# Patient Record
Sex: Male | Born: 1950 | Race: White | Hispanic: No | Smoking: Former smoker
Health system: Southern US, Community
[De-identification: ages and names within clinical notes are randomized; demographics above are authoritative.]

## PROBLEM LIST (undated history)

## (undated) DIAGNOSIS — Z860100 Personal history of colon polyps, unspecified: Secondary | ICD-10-CM

## (undated) DIAGNOSIS — H269 Unspecified cataract: Secondary | ICD-10-CM

## (undated) DIAGNOSIS — C9110 Chronic lymphocytic leukemia of B-cell type not having achieved remission: Secondary | ICD-10-CM

## (undated) DIAGNOSIS — Z8709 Personal history of other diseases of the respiratory system: Secondary | ICD-10-CM

## (undated) DIAGNOSIS — IMO0001 Reserved for inherently not codable concepts without codable children: Secondary | ICD-10-CM

## (undated) DIAGNOSIS — N529 Male erectile dysfunction, unspecified: Secondary | ICD-10-CM

## (undated) DIAGNOSIS — E785 Hyperlipidemia, unspecified: Secondary | ICD-10-CM

## (undated) DIAGNOSIS — D649 Anemia, unspecified: Secondary | ICD-10-CM

## (undated) DIAGNOSIS — M1990 Unspecified osteoarthritis, unspecified site: Secondary | ICD-10-CM

## (undated) HISTORY — DX: Hyperlipidemia, unspecified: E78.5

## (undated) HISTORY — DX: Personal history of colon polyps, unspecified: Z86.0100

## (undated) HISTORY — DX: Unspecified osteoarthritis, unspecified site: M19.90

## (undated) HISTORY — DX: Male erectile dysfunction, unspecified: N52.9

## (undated) HISTORY — DX: Unspecified cataract: H26.9

## (undated) HISTORY — DX: Personal history of colonic polyps: Z86.010

## (undated) HISTORY — PX: KNEE SURGERY: SHX244

## (undated) HISTORY — PX: APPENDECTOMY: SHX54

---

## 2011-04-25 ENCOUNTER — Encounter: Payer: Self-pay | Admitting: Internal Medicine

## 2011-06-06 ENCOUNTER — Encounter: Payer: Self-pay | Admitting: Internal Medicine

## 2011-06-06 ENCOUNTER — Ambulatory Visit (INDEPENDENT_AMBULATORY_CARE_PROVIDER_SITE_OTHER): Payer: Managed Care, Other (non HMO) | Admitting: Internal Medicine

## 2011-06-06 DIAGNOSIS — K5900 Constipation, unspecified: Secondary | ICD-10-CM

## 2011-06-06 DIAGNOSIS — Z800 Family history of malignant neoplasm of digestive organs: Secondary | ICD-10-CM

## 2011-06-06 DIAGNOSIS — Z86010 Personal history of colonic polyps: Secondary | ICD-10-CM

## 2011-06-06 MED ORDER — PEG-KCL-NACL-NASULF-NA ASC-C 100 G PO SOLR: 1.0000 | Freq: Once | ORAL | Status: DC

## 2011-06-06 NOTE — Progress Notes (Signed)
HISTORY OF PRESENT ILLNESS:  Travis Wood is a 60 y.o. male who said today regarding constipation and the need for colonoscopy. The patient's father had colon cancer at age 57 as well as colon polyps. Patient has had several colonoscopies, most recently August 2010 in [place] while being [facility]. No report available today, though the patient reports suboptimal prep. Prior colonoscopies in [place] and [place] more remotely. Apparently, a history of polyps. Problems with constipation intermittently. GI review of systems otherwise negative. Review of outside laboratories from May of 2012, including CBC, and comprehensive metabolic panel were unremarkable.  REVIEW OF SYSTEMS:  All non-GI ROS entirely negative.  Past Medical History  Diagnosis Date  . History of colon polyps   . Osteoarthritis   . Hyperlipemia     Past Surgical History  Procedure Date  . Appendectomy   . Knee surgery     Left x 2     Social History Travis Wood  reports that he has quit smoking. He has never used smokeless tobacco. He reports that he does not drink alcohol or use illicit drugs.  family history includes Colon cancer in his father and Colon polyps in his father.  No Known Allergies     PHYSICAL EXAMINATION: Vital signs: BP 124/62  Pulse 80  Ht 6\' 2"  (1.88 m)  Wt 232 lb (105.235 kg)  BMI 29.79 kg/m2  Constitutional: generally well-appearing, no acute distress Psychiatric: alert and oriented x3, cooperative Eyes: extraocular movements intact, anicteric, conjunctiva pink Mouth: oral pharynx moist, no lesions Neck: supple no lymphadenopathy Cardiovascular: heart regular rate and rhythm, no murmur Lungs: clear to auscultation bilaterally Abdomen: soft, nontender, nondistended, no obvious ascites, no peritoneal signs, normal bowel sounds, no organomegaly Rectal: Deferred until colonoscopy Extremities: no lower extremity edema bilaterally Skin: no lesions on visible  extremities Neuro: No focal deficits.  ASSESSMENT:  #1. Functional constipation #2. Family history of colon cancer and personal history of polyps (type unknown) #3. Colonoscopy 2 years ago reported to have suboptimal prep   PLAN:  #1. Patient has agreed to bring prior GI records for my review #2. MiraLax when necessary for constipation #3. Colonoscopy. Movi prep prescribed. Patient instructed on its use. The nature of the procedure, as well as the risks, benefits, and alternatives were carefully and thoroughly reviewed with the patient. Ample time for discussion and questions allowed. The patient understood, was satisfied, and agreed to proceed.

## 2011-06-06 NOTE — Patient Instructions (Signed)
Colon [facility] 07/24/11 10:30 am arrive at 9:30 am on 4th floor Moviprep prescription sent to your pharmacy  Colonoscopy brochure given to you for review.

## 2011-07-24 ENCOUNTER — Ambulatory Visit (AMBULATORY_SURGERY_CENTER): Payer: Managed Care, Other (non HMO) | Admitting: Internal Medicine

## 2011-07-24 ENCOUNTER — Encounter: Payer: Self-pay | Admitting: Internal Medicine

## 2011-07-24 VITALS — BP 112/69 | HR 54 | Temp 97.5°F | Resp 20 | Ht 75.0 in | Wt 232.0 lb

## 2011-07-24 DIAGNOSIS — Z860100 Personal history of colon polyps, unspecified: Secondary | ICD-10-CM

## 2011-07-24 DIAGNOSIS — Z1211 Encounter for screening for malignant neoplasm of colon: Secondary | ICD-10-CM

## 2011-07-24 DIAGNOSIS — D126 Benign neoplasm of colon, unspecified: Secondary | ICD-10-CM

## 2011-07-24 DIAGNOSIS — K5900 Constipation, unspecified: Secondary | ICD-10-CM

## 2011-07-24 DIAGNOSIS — Z800 Family history of malignant neoplasm of digestive organs: Secondary | ICD-10-CM

## 2011-07-24 LAB — HM COLONOSCOPY

## 2011-07-24 MED ORDER — SODIUM CHLORIDE 0.9 % IV SOLN: 500.0000 mL | INTRAVENOUS | Status: DC

## 2011-07-24 NOTE — Patient Instructions (Signed)
Please read the handouts given to you by your recovery room nurse.   You need another colonoscopy in 3 years due to 7 polyps.   The biopsy results will be mailed to you within 2 weeks.    You need to increase the fiber in your diet due to your severe diverticulosis in your colon.  We recommend metamucil or benefiber for  Supplements.   Resume your routine medications today.   Drink plenty of water.   If you have any questions, please call us at [phone].  Thank-you.

## 2011-07-25 ENCOUNTER — Telehealth: Payer: Self-pay | Admitting: *Deleted

## 2011-07-25 NOTE — Telephone Encounter (Signed)

## 2013-01-07 ENCOUNTER — Encounter: Payer: Self-pay | Admitting: Family Medicine

## 2014-06-14 ENCOUNTER — Encounter: Payer: Self-pay | Admitting: Internal Medicine

## 2015-01-08 ENCOUNTER — Ambulatory Visit (INDEPENDENT_AMBULATORY_CARE_PROVIDER_SITE_OTHER): Payer: 59

## 2015-01-08 ENCOUNTER — Encounter: Payer: Self-pay | Admitting: Family Medicine

## 2015-01-08 ENCOUNTER — Ambulatory Visit (INDEPENDENT_AMBULATORY_CARE_PROVIDER_SITE_OTHER): Payer: 59 | Admitting: Family Medicine

## 2015-01-08 VITALS — BP 131/76 | HR 68 | Temp 98.0°F | Ht 74.5 in | Wt 225.6 lb

## 2015-01-08 DIAGNOSIS — J439 Emphysema, unspecified: Secondary | ICD-10-CM

## 2015-01-08 DIAGNOSIS — N4289 Other specified disorders of prostate: Secondary | ICD-10-CM

## 2015-01-08 DIAGNOSIS — N429 Disorder of prostate, unspecified: Secondary | ICD-10-CM

## 2015-01-08 DIAGNOSIS — M25511 Pain in right shoulder: Secondary | ICD-10-CM

## 2015-01-08 DIAGNOSIS — Z0000 Encounter for general adult medical examination without abnormal findings: Secondary | ICD-10-CM

## 2015-01-08 DIAGNOSIS — Z1212 Encounter for screening for malignant neoplasm of rectum: Secondary | ICD-10-CM

## 2015-01-08 MED ORDER — LEVOFLOXACIN 500 MG PO TABS: 500.0000 mg | ORAL_TABLET | Freq: Every day | ORAL | Status: DC

## 2015-01-08 MED ORDER — ALBUTEROL SULFATE (2.5 MG/3ML) 0.083% IN NEBU
2.5000 mg | INHALATION_SOLUTION | Freq: Once | RESPIRATORY_TRACT | Status: AC
  Administered 2015-01-08: 2.5 mg via RESPIRATORY_TRACT

## 2015-01-08 MED ORDER — DICLOFENAC SODIUM 75 MG PO TBEC: 75.0000 mg | DELAYED_RELEASE_TABLET | Freq: Two times a day (BID) | ORAL | Status: DC

## 2015-01-08 MED ORDER — PNEUMOCOCCAL VAC POLYVALENT 25 MCG/0.5ML IJ INJ: 0.5000 mL | INJECTION | Freq: Once | INTRAMUSCULAR | Status: DC

## 2015-01-08 MED ORDER — BETAMETHASONE SOD PHOS & ACET 6 (3-3) MG/ML IJ SUSP
6.0000 mg | Freq: Once | INTRAMUSCULAR | Status: AC
  Administered 2015-01-08: 6 mg via INTRAMUSCULAR

## 2015-01-08 NOTE — Progress Notes (Signed)
Subjective:  Patient ID: Travis Wood, male    DOB: [birth year]  Age: 64 y.o. MRN: [id number]  CC: Annual Exam   HPI Travis Wood presents for CPE, but notes some dyspnea on exertion, wheezing. Smokes cannabis for many years. Jokes about tobacco, unclear how much tobacco is used. Also having a great deal of pain in the right shoulder particularly for external adducted rotation. Has had frequent colonoscopy due to polyp in past that was precancerous when removed at colonoscopy.  History Travis Wood has a past medical history of History of colon polyps; Osteoarthritis; Hyperlipemia; and Erectile dysfunction.   He has past surgical history that includes Appendectomy and Knee surgery.   His family history includes Cancer in his sister; Colon cancer in his father; Colon polyps in his father; Heart disease in his father and mother.He reports that he has never smoked. He has never used smokeless tobacco. He reports that he does not drink alcohol or use illicit drugs.  Current Outpatient Prescriptions on File Prior to Visit  Medication Sig Dispense Refill  . sildenafil (VIAGRA) 100 MG tablet Take 100 mg by mouth as needed. Take 1 hour before sex     No current facility-administered medications on file prior to visit.    ROS Review of Systems  Constitutional: Negative for fever, chills, diaphoresis and unexpected weight change.  HENT: Negative for congestion, hearing loss, rhinorrhea, sore throat and trouble swallowing.   Respiratory: Negative for cough, chest tightness, shortness of breath and wheezing.   Gastrointestinal: Negative for nausea, vomiting, abdominal pain, diarrhea, constipation and abdominal distention.  Endocrine: Negative for cold intolerance and heat intolerance.  Genitourinary: Negative for dysuria, hematuria and flank pain.  Musculoskeletal: Negative for joint swelling and arthralgias.  Skin: Negative for rash.  Neurological: Negative for dizziness and headaches.    Psychiatric/Behavioral: Negative for dysphoric mood, decreased concentration and agitation. The patient is not nervous/anxious.     Objective:  BP 131/76 mmHg  Pulse 68  Temp(Src) 98 F (36.7 C) (Oral)  Ht 6' 2.5" (1.892 m)  Wt 225 lb 9.6 oz (102.331 kg)  BMI 28.59 kg/m2  Physical Exam  Constitutional: He is oriented to person, place, and time. He appears well-developed and well-nourished.  HENT:  Head: Normocephalic and atraumatic.  Mouth/Throat: Oropharynx is clear and moist.  Eyes: EOM are normal. Pupils are equal, round, and reactive to light.  Neck: Normal range of motion. No tracheal deviation present. No thyromegaly present.  Cardiovascular: Normal rate, regular rhythm and normal heart sounds.  Exam reveals no gallop and no friction rub.   No murmur heard. Pulmonary/Chest: Breath sounds normal. He has no wheezes. He has no rales.  Abdominal: Soft. He exhibits no mass. There is no tenderness.  Genitourinary: Penis normal. Rectal exam shows no external hemorrhoid, no tenderness and anal tone normal. Prostate is enlarged.  Musculoskeletal: Normal range of motion. He exhibits no edema.  Neurological: He is alert and oriented to person, place, and time.  Skin: Skin is warm and dry.  Psychiatric: He has a normal mood and affect.    Assessment & Plan:   Travis Wood was seen today for annual exam.  Diagnoses and associated orders for this visit:  Wellness examination - POCT CBC - CMP14+EGFR - NMR, lipoprofile - PSA, total and free - Thyroid Panel With TSH - Vit D  25 hydroxy (rtn osteoporosis monitoring)  Pulmonary emphysema, unspecified emphysema type - albuterol (PROVENTIL) (2.5 MG/3ML) 0.083% nebulizer solution 2.5 mg; Take 3 mLs (2.5 mg total) by  nebulization once. - pneumococcal 23 valent vaccine (PNU-IMMUNE) injection 0.5 mL; Inject 0.5 mLs into the muscle once. - betamethasone acetate-betamethasone sodium phosphate (CELESTONE) injection 6 mg; Inject 1 mL (6 mg total)  into the muscle once. - DG Chest 2 View; Future  Pain in joint, shoulder region, right - Ambulatory referral to Physical Therapy  Prostate mass  Screening for malignant neoplasm of the rectum - Fecal occult blood, imunochemical  Other Orders - levofloxacin (LEVAQUIN) 500 MG tablet; Take 1 tablet (500 mg total) by mouth daily. - Pneumococcal polysaccharide vaccine 23-valent greater than or equal to 2yo subcutaneous/IM - diclofenac (VOLTAREN) 75 MG EC tablet; Take 1 tablet (75 mg total) by mouth 2 (two) times daily.    I have discontinued Travis Wood diclofenac and simvastatin. I am also having him start on levofloxacin and diclofenac. Additionally, I am having him maintain his sildenafil. We administered albuterol and betamethasone acetate-betamethasone sodium phosphate. We will continue to administer pneumococcal 23 valent vaccine.  Meds ordered this encounter  Medications  . albuterol (PROVENTIL) (2.5 MG/3ML) 0.083% nebulizer solution 2.5 mg    Sig:   . pneumococcal 23 valent vaccine (PNU-IMMUNE) injection 0.5 mL    Sig:   . betamethasone acetate-betamethasone sodium phosphate (CELESTONE) injection 6 mg    Sig:   . levofloxacin (LEVAQUIN) 500 MG tablet    Sig: Take 1 tablet (500 mg total) by mouth daily.    Dispense:  7 tablet    Refill:  0  . diclofenac (VOLTAREN) 75 MG EC tablet    Sig: Take 1 tablet (75 mg total) by mouth 2 (two) times daily.    Dispense:  60 tablet    Refill:  2     Follow-up: Return in about 6 months (around 07/09/2015) for COPD.  Claretta Fraise, M.D.

## 2015-01-11 LAB — FECAL OCCULT BLOOD, IMMUNOCHEMICAL: Fecal Occult Bld: NEGATIVE

## 2015-01-23 ENCOUNTER — Ambulatory Visit: Payer: 59 | Attending: Family Medicine | Admitting: Physical Therapy

## 2015-01-23 ENCOUNTER — Encounter: Payer: Self-pay | Admitting: Physical Therapy

## 2015-01-23 DIAGNOSIS — M1990 Unspecified osteoarthritis, unspecified site: Secondary | ICD-10-CM | POA: Insufficient documentation

## 2015-01-23 DIAGNOSIS — M25511 Pain in right shoulder: Secondary | ICD-10-CM | POA: Diagnosis present

## 2015-01-23 DIAGNOSIS — E785 Hyperlipidemia, unspecified: Secondary | ICD-10-CM | POA: Insufficient documentation

## 2015-01-23 NOTE — Addendum Note (Signed)
Addended by: Leif Loflin, [place] W on: 01/23/2015 03:55 PM   Modules accepted: Orders

## 2015-01-23 NOTE — Addendum Note (Signed)
Addended by: Haidan Nhan, [place] W on: 01/23/2015 11:38 AM   Modules accepted: Orders

## 2015-01-23 NOTE — Therapy (Signed)
[facility] [place], [place], [address] Phone: [phone]   Fax:  [phone]  Physical Therapy Evaluation  Patient Details  Name: Travis Wood MRN: [id number] Date of Birth: February 03, 1951 Referring Provider:  Claretta Fraise, MD  Encounter Date: 01/23/2015      PT End of Session - 01/23/15 1035    Visit Number 1   Number of Visits 12   PT Start Time 2774   PT Stop Time 1114   PT Time Calculation (min) 39 min   Activity Tolerance Patient tolerated treatment well   Behavior During Therapy [facility] for tasks assessed/performed      Past Medical History  Diagnosis Date  . History of colon polyps   . Osteoarthritis   . Hyperlipemia   . Erectile dysfunction     Past Surgical History  Procedure Laterality Date  . Appendectomy    . Knee surgery      Left x 2     There were no vitals taken for this visit.  Visit Diagnosis:  Right shoulder pain - Plan: PT plan of care cert/re-cert      Subjective Assessment - 01/23/15 1038    Symptoms Throwing arm out/golfing hurts a lot.   Pertinent History Slipped and fell onto right shoulder.   Patient Stated Goals Want to golf without pain.   Currently in Pain? Yes   Pain Score 7    Pain Location Shoulder   Pain Orientation Right   Pain Descriptors / Indicators Stabbing;Sharp   Pain Type Chronic pain   Pain Onset More than a month ago   Pain Frequency Intermittent   Aggravating Factors  Golfing.   Pain Relieving Factors Rest.   Effect of Pain on Daily Activities Can golf like I want to.   Multiple Pain Sites No          [facility] PT Assessment - 01/23/15 0001    Assessment   Medical Diagnosis Right shoulder pain.   Onset Date --  Mid-summer 2015   Precautions   Precautions None   Balance Screen   Has the patient fallen in the past 6 months Yes  Slipped and fell   How many times? 1   Has the patient had a decrease in activity level because of a fear of falling?  No   Is the patient reluctant to leave their home because of a fear of falling?  No   ROM / Strength   AROM / PROM / Strength AROM;Strength   AROM   Overall AROM  Within functional limits for tasks performed   AROM Assessment Site Shoulder   Right/Left Shoulder Right   Right Shoulder External Rotation --  4 to 4+/5 limited in part due to pain.   Strength   Overall Strength Comments Right shoulder ER= 4 to 4+/5 limited in part due to pain.   Strength Assessment Site Shoulder   Right/Left Shoulder Right   Palpation   Palpation Tender to palpation right posterior cuff region and acromial ridge   Special Tests    Special Tests Rotator Cuff Impingement   Rotator Cuff Impingment tests Neer impingement test   Neer Impingement test    Findings Positive   Side Right                  [facility] Adult PT Treatment/Exercise - 01/23/15 0001    Modalities   Modalities Electrical Stimulation   Electrical Stimulation   Electrical Stimulation Location Right shoulder   Electrical  Stimulation Parameters Pre-mod at 80-150 Hz x 20 minutes   Electrical Stimulation Goals Pain                     PT Long Term Goals - 01/23/15 1105    PT LONG TERM GOAL #1   Title Ind with an advanced HEP.   Time 6   Period Weeks   Status New   PT LONG TERM GOAL #2   Title Return to golfing with pain not > 2/10   Time 6   Period Weeks   Status New   PT LONG TERM GOAL #3   Title Peform all ADL's with pain not > 2/10   Time 6   Period Weeks   Status New               Plan - 01/23/15 1102    Clinical Impression Statement The patient reports right shoulder pain since last summer but especially after slipping an dfalling onto his right shoulder.  He reports no pain at rest but up to 5/44 with certain right UE movements.  It has impaired his ability to play golf.   Rehab Potential Excellent   PT Frequency 2x / week   PT Duration 6 weeks   PT Treatment/Interventions Moist Heat;Therapeutic  activities;Therapeutic exercise;Electrical Stimulation;Ultrasound;Manual techniques   PT Next Visit Plan Begin right shouler [place] 4; SDLY ER and full can   PT Home Exercise Plan RW4   Consulted and Agree with Plan of Care Patient         Problem List There are no active problems to display for this patient.   Omega Durante, [place] MPT 01/23/2015, 11:29 AM  [facility] [address] [place], [place], [address] Phone: [phone]   Fax:  [phone]

## 2015-01-30 ENCOUNTER — Ambulatory Visit: Payer: 59 | Attending: Family Medicine | Admitting: *Deleted

## 2015-01-30 ENCOUNTER — Encounter: Payer: Self-pay | Admitting: *Deleted

## 2015-01-30 DIAGNOSIS — M1990 Unspecified osteoarthritis, unspecified site: Secondary | ICD-10-CM | POA: Insufficient documentation

## 2015-01-30 DIAGNOSIS — M25511 Pain in right shoulder: Secondary | ICD-10-CM | POA: Diagnosis not present

## 2015-01-30 DIAGNOSIS — E785 Hyperlipidemia, unspecified: Secondary | ICD-10-CM | POA: Diagnosis not present

## 2015-01-30 NOTE — Therapy (Signed)
[facility] [place], [place], [address] Phone: [phone]   Fax:  [phone]  Physical Therapy Treatment  Patient Details  Name: Humberto Addo MRN: [id number] Date of Birth: 19-Nov-1951 Referring Provider:  Claretta Fraise, MD  Encounter Date: 01/30/2015      PT End of Session - 01/30/15 1054    Visit Number 2   Number of Visits 12   PT Start Time 1031   PT Stop Time 1120   PT Time Calculation (min) 49 min      Past Medical History  Diagnosis Date  . History of colon polyps   . Osteoarthritis   . Hyperlipemia   . Erectile dysfunction     Past Surgical History  Procedure Laterality Date  . Appendectomy    . Knee surgery      Left x 2     There were no vitals taken for this visit.  Visit Diagnosis:  Right shoulder pain      Subjective Assessment - 01/30/15 1045    Symptoms RT shldr mainly hurts with a raising motion   Aggravating Factors  Pulling things up   Pain Relieving Factors rest,                    [facility] Adult PT Treatment/Exercise - 01/30/15 0001    Exercises   Exercises Shoulder   Shoulder Exercises: Supine   Protraction Strengthening   Shoulder Exercises: Standing   Other Standing Exercises RW 4 with yellow t-band 3x10 FLex/ext, ER/IR   Other Standing Exercises Full can to shldr level 1# x10, 2# x10 pause at top   Modalities   Modalities Ultrasound   Ultrasound   Ultrasound Location RT shldr lateral aspect in sitting   Ultrasound Parameters 1.5 w/cm sq. x10 mins   Ultrasound Goals Pain                PT Education - 01/30/15 1052    Education provided Yes   Education Details RW 4 with yellow tband   Person(s) Educated Patient   Methods Explanation;Demonstration;Handout   Comprehension Verbalized understanding;Returned demonstration             PT Long Term Goals - 01/23/15 1105    PT LONG TERM GOAL #1   Title Ind with an advanced HEP.   Time 6   Period  Weeks   Status New   PT LONG TERM GOAL #2   Title Return to golfing with pain not > 2/10   Time 6   Period Weeks   Status New   PT LONG TERM GOAL #3   Title Peform all ADL's with pain not > 2/10   Time 6   Period Weeks   Status New               Plan - 01/30/15 1056    Clinical Impression Statement Pt did fairly well with Rx today and is independent with current HEP   PT Next Visit Plan cont with RT shldr rehab. try side lying  er rotation        Problem List There are no active problems to display for this patient.   Lynne Takemoto,CHRIS PTA 01/30/2015, 12:10 PM  [facility] [address] [place], [place], [address] Phone: [phone]   Fax:  [phone]

## 2015-02-02 ENCOUNTER — Encounter: Payer: Self-pay | Admitting: *Deleted

## 2015-02-02 ENCOUNTER — Ambulatory Visit: Payer: 59 | Admitting: *Deleted

## 2015-02-02 DIAGNOSIS — M25511 Pain in right shoulder: Secondary | ICD-10-CM

## 2015-02-02 NOTE — Therapy (Signed)
[facility] [place], [place], [address] Phone: [phone]   Fax:  [phone]  Physical Therapy Treatment  Patient Details  Name: Travis Wood MRN: [id number] Date of Birth: 1951/06/01 Referring Provider:  Claretta Fraise, MD  Encounter Date: 02/02/2015      PT End of Session - 02/02/15 1046    Visit Number 3   Number of Visits 12   PT Start Time 7124   PT Stop Time 1117   PT Time Calculation (min) 47 min      Past Medical History  Diagnosis Date  . History of colon polyps   . Osteoarthritis   . Hyperlipemia   . Erectile dysfunction     Past Surgical History  Procedure Laterality Date  . Appendectomy    . Knee surgery      Left x 2     There were no vitals taken for this visit.  Visit Diagnosis:  Right shoulder pain      Subjective Assessment - 02/02/15 1035    Symptoms RT shldr mainly hurts with a raising motion. Did well after last Rx   Pertinent History Slipped and fell onto right shoulder.   Patient Stated Goals Want to golf without pain.   Currently in Pain? Yes   Pain Score 2    Pain Location Shoulder   Pain Orientation Right   Pain Descriptors / Indicators Sharp   Pain Type Chronic pain   Pain Onset More than a month ago   Pain Frequency Intermittent   Aggravating Factors  when pulling things up, golf                    [facility] Adult PT Treatment/Exercise - 02/02/15 0001    Shoulder Exercises: Sidelying   External Rotation Strengthening;Right;15 reps;Weights   External Rotation Weight (lbs) 1# 2x15   Shoulder Exercises: Standing   Other Standing Exercises RW 4 with yellow t-band 2x10 FLex/ext, ER/IR   Other Standing Exercises Full can to shldr level , 2# 2x15  pause at top   Shoulder Exercises: ROM/Strengthening   UBE (Upper Arm Bike) 90 RPMs x 6 mins   Modalities   Modalities Ultrasound  combo   Ultrasound   Ultrasound Location RT shldr   Ultrasound Parameters 1.5 w/cm  sq.   Ultrasound Goals Pain                     PT Long Term Goals - 01/23/15 1105    PT LONG TERM GOAL #1   Title Ind with an advanced HEP.   Time 6   Period Weeks   Status New   PT LONG TERM GOAL #2   Title Return to golfing with pain not > 2/10   Time 6   Period Weeks   Status New   PT LONG TERM GOAL #3   Title Peform all ADL's with pain not > 2/10   Time 6   Period Weeks   Status New               Plan - 02/02/15 1046    Clinical Impression Statement Pt did well again with exs and is Independent with RW4   Rehab Potential Excellent   PT Frequency 2x / week   PT Duration 6 weeks   PT Treatment/Interventions Moist Heat;Therapeutic activities;Therapeutic exercise;Electrical Stimulation;Ultrasound;Manual techniques   PT Next Visit Plan cont with RT shldr rehab. No new goals met today   PT Home Exercise  Plan RW4,side lying er        Problem List There are no active problems to display for this patient.   Mckinsey Keagle,CHRIS,PTA 02/02/2015, 11:34 AM  [facility] [address] [place], [place], [address] Phone: [phone]   Fax:  [phone]

## 2015-02-08 ENCOUNTER — Ambulatory Visit: Payer: 59 | Admitting: Physical Therapy

## 2015-02-08 ENCOUNTER — Encounter: Payer: Self-pay | Admitting: Physical Therapy

## 2015-02-08 DIAGNOSIS — M25511 Pain in right shoulder: Secondary | ICD-10-CM | POA: Diagnosis not present

## 2015-02-08 NOTE — Therapy (Signed)
[facility] [place], [place], [address] Phone: [phone]   Fax:  [phone]  Physical Therapy Treatment  Patient Details  Name: Travis Wood MRN: [id number] Date of Birth: 29-Sep-1951 Referring Provider:  Claretta Fraise, MD  Encounter Date: 02/08/2015      PT End of Session - 02/08/15 0850    Visit Number 4   Number of Visits 12   PT Start Time 0815   PT Stop Time 0850   PT Time Calculation (min) 35 min   Activity Tolerance Patient tolerated treatment well   Behavior During Therapy [facility] for tasks assessed/performed      Past Medical History  Diagnosis Date  . History of colon polyps   . Osteoarthritis   . Hyperlipemia   . Erectile dysfunction     Past Surgical History  Procedure Laterality Date  . Appendectomy    . Knee surgery      Left x 2     There were no vitals taken for this visit.  Visit Diagnosis:  Right shoulder pain      Subjective Assessment - 02/08/15 0819    Symptoms sore after playing golf    Pertinent History Slipped and fell onto right shoulder.   Patient Stated Goals Want to golf without pain.   Currently in Pain? Yes   Pain Score 6    Pain Location Shoulder   Pain Orientation Right   Pain Descriptors / Indicators Sore;Sharp   Pain Type Chronic pain   Pain Onset More than a month ago   Aggravating Factors  golf/activity   Pain Relieving Factors rest                    [facility] Adult PT Treatment/Exercise - 02/08/15 0001    Shoulder Exercises: Sidelying   External Rotation Weight (lbs) 2# 2x15   Shoulder Exercises: Standing   Other Standing Exercises RW 4 with yellow t-band 3x10 each   Other Standing Exercises Full can to shldr level , 2# 2x15    Shoulder Exercises: ROM/Strengthening   UBE (Upper Arm Bike) 90 RPMs x 8 mins   Modalities   Modalities Ultrasound   Ultrasound   Ultrasound Location right shoulder   Ultrasound Parameters 1.5w/cm2/50%/33mhz   Ultrasound  Goals Pain                     PT Long Term Goals - 01/23/15 1105    PT LONG TERM GOAL #1   Title Ind with an advanced HEP.   Time 6   Period Weeks   Status New   PT LONG TERM GOAL #2   Title Return to golfing with pain not > 2/10   Time 6   Period Weeks   Status New   PT LONG TERM GOAL #3   Title Peform all ADL's with pain not > 2/10   Time 6   Period Weeks   Status New               Plan - 02/08/15 6834    Clinical Impression Statement pt tolerated tx very well today and able to increase wts today with no difficulty.Goals ongoing.   Rehab Potential Excellent   PT Frequency 2x / week   PT Duration 6 weeks   PT Treatment/Interventions Moist Heat;Therapeutic activities;Therapeutic exercise;Electrical Stimulation;Ultrasound;Manual techniques   PT Next Visit Plan Cont with POC   Consulted and Agree with Plan of Care Patient  Problem List There are no active problems to display for this patient.   Phillips Climes, PTA 02/08/2015, 8:54 AM  [facility] [address] [place], [place], [address] Phone: [phone]   Fax:  [phone]

## 2015-02-13 ENCOUNTER — Encounter: Payer: Self-pay | Admitting: Physical Therapy

## 2015-02-13 ENCOUNTER — Ambulatory Visit: Payer: 59 | Admitting: Physical Therapy

## 2015-02-13 DIAGNOSIS — M25511 Pain in right shoulder: Secondary | ICD-10-CM

## 2015-02-13 NOTE — Therapy (Signed)
[facility] [place], [place], [address] Phone: [phone]   Fax:  [phone]  Physical Therapy Treatment  Patient Details  Name: Markham Dumlao MRN: [id number] Date of Birth: 1951/02/05 Referring Provider:  Claretta Fraise, MD  Encounter Date: 02/13/2015      PT End of Session - 02/13/15 0943    Visit Number 5   Number of Visits 12   PT Start Time 0900   PT Stop Time 0942   PT Time Calculation (min) 42 min   Activity Tolerance Patient tolerated treatment well   Behavior During Therapy [facility] for tasks assessed/performed      Past Medical History  Diagnosis Date  . History of colon polyps   . Osteoarthritis   . Hyperlipemia   . Erectile dysfunction     Past Surgical History  Procedure Laterality Date  . Appendectomy    . Knee surgery      Left x 2     There were no vitals filed for this visit.  Visit Diagnosis:  Right shoulder pain      Subjective Assessment - 02/13/15 0902    Symptoms Patient reports that he experiences pain going into external rotation. All other movements are not painful. Patient reports compliance with HEP. Patient denied pain or soreness at the beginning of session. Patient states that he plans to start chippng exercises at the golf course soon but not full swings.    Pertinent History Slipped and fell onto right shoulder.   Patient Stated Goals Want to golf without pain.   Currently in Pain? No/denies   Aggravating Factors  golf/activity. Although patient reports he isn't playing golf as of now due to his inability.   Pain Relieving Factors Rest                       [facility] Adult PT Treatment/Exercise - 02/13/15 0001    Exercises   Exercises Shoulder   Shoulder Exercises: Prone   Horizontal ABduction 1 Strengthening;Right;10 reps  2 sets 10 reps. Started with 2# but patient reported pain.   Shoulder Exercises: Standing   Protraction Strengthening;Right;10  reps;Theraband  3 sets 10 reps   Theraband Level (Shoulder Protraction) Level 2 (Red)   External Rotation Strengthening;Right;10 reps;Theraband  3 sets 10 reps   Theraband Level (Shoulder External Rotation) Level 2 (Red)   Internal Rotation Strengthening;Right;10 reps;Theraband  3 sets 10 reps   Theraband Level (Shoulder Internal Rotation) Level 2 (Red)   Row Strengthening;Right;10 reps;Theraband  3 sets 10 reps   Theraband Level (Shoulder Row) Level 2 (Red)   Other Standing Exercises Full can 3#  3 sets 10 reps   to shoulder level   Shoulder Exercises: ROM/Strengthening   UBE (Upper Arm Bike) 90 RPMs x 8 mins   Modalities   Modalities Ultrasound   Electrical Stimulation   Electrical Stimulation Location Right shoulder   Electrical Stimulation Parameters 1.5 w/cm2     50%    1 mhz   Electrical Stimulation Goals Pain                PT Education - 02/13/15 [phone]    Education provided Yes   Education Details Patient was given red theraband to increase resistance with HEP. Patient was educated to continue all current exercises with red theraband and to notify PT staff if theraband begins to become easier.   Person(s) Educated Patient   Methods Explanation   Comprehension Verbalized understanding  PT Long Term Goals - 02/13/15 9741    PT LONG TERM GOAL #1   Title Ind with an advanced HEP.   Time 6   Period Weeks   Status New   PT LONG TERM GOAL #2   Title Return to golfing with pain not > 2/10   Time 6   Period Weeks   Status On-going   PT LONG TERM GOAL #3   Title Peform all ADL's with pain not > 2/10   Time 6   Period Weeks   Status Achieved               Plan - 02/13/15 0944    Clinical Impression Statement Patient tolerated treatment well today with the only complaint of pain during prone horizontal abduction with 2# weight. Patient demonstrated good mechanics with all exercises. Patient denied any pain during external rotation with  red theraband. Patient met long term goal #3 (perform ADLs with pain no > 2/10). Patient denied pain after ultrasound just slight soreness.   Rehab Potential Excellent   PT Frequency 2x / week   PT Duration 6 weeks   PT Treatment/Interventions Moist Heat;Therapeutic activities;Therapeutic exercise;Electrical Stimulation;Ultrasound;Manual techniques   PT Next Visit Plan Continue with PT POC. Consider giving horizontal abduction in HEP next session. Consider beginning small short swings with dumbbell next session.        Problem List There are no active problems to display for this patient.   Wynelle Fanny, PTA 02/13/2015, 10:05 AM  [facility] [address] [place], [place], [address] Phone: [phone]   Fax:  [phone]

## 2015-02-15 ENCOUNTER — Encounter: Payer: Managed Care, Other (non HMO) | Admitting: Physical Therapy

## 2015-02-19 ENCOUNTER — Ambulatory Visit (INDEPENDENT_AMBULATORY_CARE_PROVIDER_SITE_OTHER): Payer: 59 | Admitting: Family Medicine

## 2015-02-19 ENCOUNTER — Encounter: Payer: Self-pay | Admitting: Family Medicine

## 2015-02-19 VITALS — BP 133/77 | HR 61 | Temp 98.0°F | Ht 74.5 in | Wt 230.0 lb

## 2015-02-19 DIAGNOSIS — J4521 Mild intermittent asthma with (acute) exacerbation: Secondary | ICD-10-CM | POA: Diagnosis not present

## 2015-02-19 DIAGNOSIS — Z0000 Encounter for general adult medical examination without abnormal findings: Secondary | ICD-10-CM

## 2015-02-19 MED ORDER — PREDNISONE 10 MG PO TABS: ORAL_TABLET | ORAL | Status: DC

## 2015-02-19 MED ORDER — MOXIFLOXACIN HCL 400 MG PO TABS: 400.0000 mg | ORAL_TABLET | Freq: Every day | ORAL | Status: DC

## 2015-02-19 MED ORDER — BETAMETHASONE SOD PHOS & ACET 6 (3-3) MG/ML IJ SUSP
6.0000 mg | Freq: Once | INTRAMUSCULAR | Status: AC
  Administered 2015-02-19: 6 mg via INTRAMUSCULAR

## 2015-02-19 NOTE — Progress Notes (Addendum)
Subjective:  Patient ID: Travis Wood, male    DOB: 07-27-1951  Age: 64 y.o. MRN: [id number]  CC: URI   HPI Travis Wood presents for Symptoms include no  Fever, has non productive cough, post nasal drip, chills, night sweats or weight loss. Onset of symptoms was a few days ago, gradually worsening since that time. Pt.is drinking moderate amounts of fluids.  Onset was actually prior to his previous exam for physical 6 weeks ago. He took some antibiotics and things got better until about a week to week and a half ago when it hit hard again. He couldn't eat anything he was coughing severely. At this time he continues to have some tightness in his chest and a dry cough. Other symptoms have resolved.  Patient was in recently for a complete physical. He did not get his blood work done that day. He is ready to have it done now. Of note is that he had a small nodule on his prostate. We discussed referral versus monitoring PSA. We'll discuss further once the PSA result is been received.   History Travis Wood has a past medical history of History of colon polyps; Osteoarthritis; Hyperlipemia; and Erectile dysfunction.   He has past surgical history that includes Appendectomy and Knee surgery.   His family history includes Cancer in his sister; Colon cancer in his father; Colon polyps in his father; Heart disease in his father and mother.He reports that he has never smoked. He has never used smokeless tobacco. He reports that he does not drink alcohol or use illicit drugs.  Current Outpatient Prescriptions on File Prior to Visit  Medication Sig Dispense Refill  . diclofenac (VOLTAREN) 75 MG EC tablet Take 1 tablet (75 mg total) by mouth 2 (two) times daily. 60 tablet 2  . sildenafil (VIAGRA) 100 MG tablet Take 100 mg by mouth as needed. Take 1 hour before sex     Current Facility-Administered Medications on File Prior to Visit  Medication Dose Route Frequency Provider Last Rate Last Dose  . pneumococcal 23  valent vaccine (PNU-IMMUNE) injection 0.5 mL  0.5 mL Intramuscular Once Claretta Fraise, MD        ROS Review of Systems  Constitutional: Negative for fever, chills, activity change and appetite change.  HENT: Positive for congestion, postnasal drip, rhinorrhea and sinus pressure. Negative for ear discharge, ear pain, hearing loss, nosebleeds, sneezing and trouble swallowing.   Respiratory: Negative for chest tightness and shortness of breath.   Cardiovascular: Negative for chest pain and palpitations.  Skin: Negative for rash.    Objective:  BP 133/77 mmHg  Pulse 61  Temp(Src) 98 F (36.7 C) (Oral)  Ht 6' 2.5" (1.892 m)  Wt 230 lb (104.327 kg)  BMI 29.14 kg/m2  Physical Exam  Constitutional: He appears well-developed and well-nourished.  HENT:  Head: Normocephalic and atraumatic.  Right Ear: Tympanic membrane and external ear normal. No decreased hearing is noted.  Left Ear: Tympanic membrane and external ear normal. No decreased hearing is noted.  Nose: Mucosal edema present. Right sinus exhibits no frontal sinus tenderness. Left sinus exhibits no frontal sinus tenderness.  Mouth/Throat: No oropharyngeal exudate or posterior oropharyngeal erythema.  Neck: No Brudzinski's sign noted.  Pulmonary/Chest: Breath sounds normal. No respiratory distress.  Lymphadenopathy:       Head (right side): No preauricular adenopathy present.       Head (left side): No preauricular adenopathy present.       Right cervical: No superficial cervical adenopathy present.  Left cervical: No superficial cervical adenopathy present.    Assessment & Plan:   Travis Wood was seen today for uri.  Diagnoses and all orders for this visit:  Asthmatic bronchitis, mild intermittent, with acute exacerbation Orders: -     betamethasone acetate-betamethasone sodium phosphate (CELESTONE) injection 6 mg; Inject 1 mL (6 mg total) into the muscle once. -     moxifloxacin (AVELOX) 400 MG tablet; Take 1 tablet (400  mg total) by mouth daily. -     predniSONE (DELTASONE) 10 MG tablet; Take 5 daily for 3 days followed by 4,3,2 and 1 for 3 days each.  Wellness examination Orders: -     POCT CBC; Future -     CMP14+EGFR; Future -     NMR, lipoprofile; Future -     PSA, total and free; Future   I have discontinued Mr. Gayler levofloxacin. I am also having him start on moxifloxacin and predniSONE. Additionally, I am having him maintain his sildenafil and diclofenac. We administered betamethasone acetate-betamethasone sodium phosphate. We will continue to administer pneumococcal 23 valent vaccine.  Meds ordered this encounter  Medications  . betamethasone acetate-betamethasone sodium phosphate (CELESTONE) injection 6 mg    Sig:   . moxifloxacin (AVELOX) 400 MG tablet    Sig: Take 1 tablet (400 mg total) by mouth daily.    Dispense:  10 tablet    Refill:  0  . predniSONE (DELTASONE) 10 MG tablet    Sig: Take 5 daily for 3 days followed by 4,3,2 and 1 for 3 days each.    Dispense:  45 tablet    Refill:  0     Follow-up: No Follow-up on file.  Claretta Fraise, M.D.

## 2015-02-20 ENCOUNTER — Other Ambulatory Visit (INDEPENDENT_AMBULATORY_CARE_PROVIDER_SITE_OTHER): Payer: 59

## 2015-02-20 ENCOUNTER — Ambulatory Visit: Payer: 59 | Admitting: Physical Therapy

## 2015-02-20 ENCOUNTER — Encounter: Payer: Self-pay | Admitting: Physical Therapy

## 2015-02-20 DIAGNOSIS — M25511 Pain in right shoulder: Secondary | ICD-10-CM

## 2015-02-20 DIAGNOSIS — Z0000 Encounter for general adult medical examination without abnormal findings: Secondary | ICD-10-CM | POA: Diagnosis not present

## 2015-02-20 LAB — POCT CBC
Granulocyte percent: 54.5 %G (ref 37–80)
HEMATOCRIT: 47.9 % (ref 43.5–53.7)
Hemoglobin: 15.2 g/dL (ref 14.1–18.1)
LYMPH, POC: 2.6 (ref 0.6–3.4)
MCH, POC: 28.9 pg (ref 27–31.2)
MCHC: 31.7 g/dL — AB (ref 31.8–35.4)
MCV: 91 fL (ref 80–97)
MPV: 8.2 fL (ref 0–99.8)
POC Granulocyte: 3.6 (ref 2–6.9)
POC LYMPH %: 38.8 % (ref 10–50)
Platelet Count, POC: 161 10*3/uL (ref 142–424)
RBC: 5.27 M/uL (ref 4.69–6.13)
RDW, POC: 13.2 %
WBC: 6.6 10*3/uL (ref 4.6–10.2)

## 2015-02-20 NOTE — Therapy (Signed)
[facility] [place], [place], [address] Phone: [phone]   Fax:  [phone]  Physical Therapy Treatment  Patient Details  Name: Travis Wood MRN: [id number] Date of Birth: [birth year] Referring Provider:  Claretta Fraise, MD  Encounter Date: 02/20/2015      PT End of Session - 02/20/15 1029    Visit Number 6   Number of Visits 12   PT Start Time 0941   PT Stop Time 1022   PT Time Calculation (min) 41 min   Activity Tolerance Patient tolerated treatment well   Behavior During Therapy [facility] for tasks assessed/performed      Past Medical History  Diagnosis Date  . History of colon polyps   . Osteoarthritis   . Hyperlipemia   . Erectile dysfunction     Past Surgical History  Procedure Laterality Date  . Appendectomy    . Knee surgery      Left x 2     There were no vitals filed for this visit.  Visit Diagnosis:  Right shoulder pain      Subjective Assessment - 02/20/15 0943    Symptoms Patient reports that his shoulder feels good but some soreness. He did try chipping and said that it went fine with no pain. States that he can tell its getting better and that it no longer hurts when he throws the covers back in the morning. Patient states that he plans to go to the golf course sometime this week but will continue chipping and short shots only.   Pertinent History Slipped and fell onto right shoulder.   Patient Stated Goals Want to golf without pain.   Currently in Pain? No/denies  Reports only soreness   Aggravating Factors  Patient states no aggravating factors.            [facility] PT Assessment - 02/20/15 0001    Assessment   Medical Diagnosis Right shoulder pain.                   [facility] Adult PT Treatment/Exercise - 02/20/15 0001    Shoulder Exercises: Prone   Extension Strengthening;Right;10 reps;Weights  3 sets   Extension Weight (lbs) 2#   Horizontal ABduction 1  Strengthening;Right;10 reps;Weights  3 sets   Horizontal ABduction 1 Weight (lbs) 2#   Other Prone Exercises Prone Rows 3 sets 10 reps 2#   Shoulder Exercises: Sidelying   External Rotation Strengthening;Right;10 reps;Weights  1 set 2#, 2 sets 3#   Shoulder Exercises: Standing   Other Standing Exercises PNF D1/D2 2# 2 sets 10 reps   Shoulder Exercises: ROM/Strengthening   UBE (Upper Arm Bike) 90 RPMs x 8 mins   Modalities   Modalities Ultrasound   Electrical Stimulation   Electrical Stimulation Location Right shoulder   Ultrasound   Ultrasound Location R posterolateral shoulder   Ultrasound Parameters 1.5 w/cm2, 100%, 10 minutes   Ultrasound Goals Pain                     PT Long Term Goals - 02/20/15 0946    PT LONG TERM GOAL #1   Title Ind with an advanced HEP.   Time 6   Period Weeks   Status Achieved   PT LONG TERM GOAL #2   Title Return to golfing with pain not > 2/10   Time 6   Period Weeks   Status On-going  Patient is not attempting full swings at golf course yet.  PT LONG TERM GOAL #3   Title Peform all ADL's with pain not > 2/10   Time 6   Period Weeks   Status Achieved               Plan - 02/20/15 1029    Clinical Impression Statement Patient tolerated treatment well today with no complaints of pain throughout treatment. Patient demonstrated good form during PNF D1/D2 exercises for strengthening of functional activities. Required multimodal cueing during therex to correct form but patient corrected quickly. Patient met goal #1 of independence with advanced HEP. The remaining goal is painless golf activity is on-going due to patient not returning to full golf activity. Patient denied pain following ultrasound only very slight soreness.   Rehab Potential Excellent   PT Frequency 2x / week   PT Duration 6 weeks   PT Treatment/Interventions Moist Heat;Therapeutic activities;Therapeutic exercise;Electrical Stimulation;Ultrasound;Manual  techniques   PT Next Visit Plan Continue with PT POC. Continue PNF strengthening next session.    Consulted and Agree with Plan of Care Patient        Problem List There are no active problems to display for this patient.   Wynelle Fanny, PTA 02/20/2015, 10:38 AM  [facility] [address] [place], [place], [address] Phone: [phone]   Fax:  [phone]

## 2015-02-20 NOTE — Progress Notes (Signed)
LAB ONLY 

## 2015-02-20 NOTE — Addendum Note (Signed)
Addended by: Claretta Fraise on: 02/20/2015 12:12 AM   Modules accepted: Miquel Dunn

## 2015-02-21 ENCOUNTER — Other Ambulatory Visit: Payer: Self-pay | Admitting: Family Medicine

## 2015-02-21 LAB — NMR, LIPOPROFILE
Cholesterol: 264 mg/dL — ABNORMAL HIGH (ref 100–199)
HDL Cholesterol by NMR: 40 mg/dL (ref >39)
HDL Particle Number: 27.4 umol/L — ABNORMAL LOW (ref >=30.5)
LDL Particle Number: 2380 nmol/L — ABNORMAL HIGH (ref <1000)
LDL Size: 20.5 nm (ref >20.5)
LDL-C: 202 mg/dL — ABNORMAL HIGH (ref 0–99)
LP-IR Score: 54 — ABNORMAL HIGH (ref <=45)
Small LDL Particle Number: 1150 nmol/L — ABNORMAL HIGH (ref <=527)
Triglycerides by NMR: 110 mg/dL (ref 0–149)

## 2015-02-21 LAB — CMP14+EGFR
ALT: 52 IU/L — ABNORMAL HIGH (ref 0–44)
AST: 34 IU/L (ref 0–40)
Albumin/Globulin Ratio: 1.6 (ref 1.1–2.5)
Albumin: 4.4 g/dL (ref 3.6–4.8)
Alkaline Phosphatase: 101 IU/L (ref 39–117)
BUN/Creatinine Ratio: 14 (ref 10–22)
BUN: 11 mg/dL (ref 8–27)
Bilirubin Total: 0.7 mg/dL (ref 0.0–1.2)
CHLORIDE: 99 mmol/L (ref 97–108)
CO2: 22 mmol/L (ref 18–29)
Calcium: 9.5 mg/dL (ref 8.6–10.2)
Creatinine, Ser: 0.77 mg/dL (ref 0.76–1.27)
GFR calc Af Amer: 112 mL/min/{1.73_m2} (ref >59)
GFR calc non Af Amer: 97 mL/min/{1.73_m2} (ref >59)
GLOBULIN, TOTAL: 2.7 g/dL (ref 1.5–4.5)
Glucose: 107 mg/dL — ABNORMAL HIGH (ref 65–99)
Potassium: 4.7 mmol/L (ref 3.5–5.2)
SODIUM: 139 mmol/L (ref 134–144)
Total Protein: 7.1 g/dL (ref 6.0–8.5)

## 2015-02-21 LAB — PSA, TOTAL AND FREE
PSA, Free Pct: 9.4 %
PSA, Free: 0.17 ng/mL
PSA: 1.8 ng/mL (ref 0.0–4.0)

## 2015-02-21 MED ORDER — ATORVASTATIN CALCIUM 40 MG PO TABS: 40.0000 mg | ORAL_TABLET | Freq: Every day | ORAL | Status: DC

## 2015-02-27 ENCOUNTER — Ambulatory Visit: Payer: 59 | Admitting: Physical Therapy

## 2015-02-27 ENCOUNTER — Encounter: Payer: Self-pay | Admitting: Physical Therapy

## 2015-02-27 DIAGNOSIS — M25511 Pain in right shoulder: Secondary | ICD-10-CM

## 2015-02-27 NOTE — Therapy (Signed)
[facility] [place], [place], [address] Phone: [phone]   Fax:  [phone]  Physical Therapy Treatment  Patient Details  Name: Travis Wood MRN: [id number] Date of Birth: [birth year] Referring Provider:  Claretta Fraise, MD  Encounter Date: 02/27/2015      PT End of Session - 02/27/15 1108    Visit Number 7   Number of Visits 12   PT Start Time 9417   PT Stop Time 1112   PT Time Calculation (min) 42 min   Activity Tolerance Patient tolerated treatment well   Behavior During Therapy [facility] for tasks assessed/performed      Past Medical History  Diagnosis Date  . History of colon polyps   . Osteoarthritis   . Hyperlipemia   . Erectile dysfunction     Past Surgical History  Procedure Laterality Date  . Appendectomy    . Knee surgery      Left x 2     There were no vitals filed for this visit.  Visit Diagnosis:  Right shoulder pain      Subjective Assessment - 02/27/15 1052    Symptoms patient has attempted his golf swing   Patient Stated Goals Want to golf without pain.   Currently in Pain? Yes   Pain Score 2    Pain Location Shoulder   Pain Orientation Right   Pain Descriptors / Indicators Sore   Aggravating Factors  increased activity   Pain Relieving Factors rest                       [facility] Adult PT Treatment/Exercise - 02/27/15 0001    Shoulder Exercises: Prone   Extension AROM;Strengthening;Right;10 reps;Weights  3sets   Extension Weight (lbs) 3#   Shoulder Exercises: Sidelying   External Rotation Strengthening;Right;10 reps;Weights  3 sets   External Rotation Weight (lbs) 3#   Shoulder Exercises: Standing   Horizontal ABduction AROM;Strengthening;Right;10 reps  3sets   Horizontal ABduction Weight (lbs) 3#   Flexion AROM;Strengthening;Right;10 reps;Weights  3 sets   Shoulder Flexion Weight (lbs) 2#   Other Standing Exercises PNF D1/D2 2# 2 sets 10 reps   Other Standing  Exercises scaption 2# 3x10   Shoulder Exercises: ROM/Strengthening   UBE (Upper Arm Bike) 90 RPMs x 8 mins   Other ROM/Strengthening Exercises RW4 with red tband 3x10 each   Other ROM/Strengthening Exercises wall push ups 3x10                     PT Long Term Goals - 02/20/15 0946    PT LONG TERM GOAL #1   Title Ind with an advanced HEP.   Time 6   Period Weeks   Status Achieved   PT LONG TERM GOAL #2   Title Return to golfing with pain not > 2/10   Time 6   Period Weeks   Status On-going  Patient is not attempting full swings at golf course yet.   PT LONG TERM GOAL #3   Title Peform all ADL's with pain not > 2/10   Time 6   Period Weeks   Status Achieved               Plan - 02/27/15 1108    Clinical Impression Statement pt tolerated tx with no pain complaints and has reported 50% improvement overall. pt has attempted golf swing with no pain increase yet has not returned fully to golf at this time. gols  ongoing   Rehab Potential Excellent   PT Frequency 2x / week   PT Duration 6 weeks   PT Treatment/Interventions Moist Heat;Therapeutic activities;Therapeutic exercise;Electrical Stimulation;Ultrasound;Manual techniques   PT Next Visit Plan Continue with PT POC. Continue PNF strengthening next session.    Consulted and Agree with Plan of Care Patient        Problem List There are no active problems to display for this patient.   Phillips Climes, PTA 02/27/2015, 11:13 AM  [facility] [address] [place], [place], [address] Phone: [phone]   Fax:  [phone]

## 2015-03-01 ENCOUNTER — Ambulatory Visit: Payer: 59 | Admitting: Physical Therapy

## 2015-03-01 ENCOUNTER — Encounter: Payer: Self-pay | Admitting: Physical Therapy

## 2015-03-01 DIAGNOSIS — M25511 Pain in right shoulder: Secondary | ICD-10-CM

## 2015-03-01 NOTE — Therapy (Signed)
[facility] [place], [place], [address] Phone: [phone]   Fax:  [phone]  Physical Therapy Treatment  Patient Details  Name: Travis Wood MRN: [id number] Date of Birth: 05-Oct-1951 Referring Provider:  Claretta Fraise, MD  Encounter Date: 03/01/2015      PT End of Session - 03/01/15 1109    Visit Number 8   Number of Visits 12   PT Start Time 2863   PT Stop Time 1111   PT Time Calculation (min) 42 min   Activity Tolerance Patient tolerated treatment well   Behavior During Therapy [facility] for tasks assessed/performed      Past Medical History  Diagnosis Date  . History of colon polyps   . Osteoarthritis   . Hyperlipemia   . Erectile dysfunction     Past Surgical History  Procedure Laterality Date  . Appendectomy    . Knee surgery      Left x 2     There were no vitals filed for this visit.  Visit Diagnosis:  Right shoulder pain      Subjective Assessment - 03/01/15 1033    Symptoms no complains today.   Currently in Pain? No/denies                       [facility] Adult PT Treatment/Exercise - 03/01/15 0001    Shoulder Exercises: Prone   Flexion AROM;Strengthening;Right;10 reps  3set   Flexion Weight (lbs) 3#   Extension AROM;Strengthening;Right;10 reps;Weights  3set   Extension Weight (lbs) 3#   Shoulder Exercises: Sidelying   External Rotation Strengthening;Right;10 reps;Weights  3set   External Rotation Weight (lbs) 3#   Shoulder Exercises: Standing   Horizontal ABduction AROM;Strengthening;Right;10 reps  3set   Horizontal ABduction Weight (lbs) 3#   Flexion AROM;Strengthening;Right;10 reps;Weights  3set   Shoulder Flexion Weight (lbs) 3#   Retraction Strengthening;Both;10 reps;Theraband  3set   Theraband Level (Shoulder Retraction) --  PINK XTS   Other Standing Exercises PNF D1/D2 4# 2 sets 10 reps   Other Standing Exercises scaption 3# 3x10   Shoulder Exercises:  ROM/Strengthening   UBE (Upper Arm Bike) 90 RPMs x 8 mins   Other ROM/Strengthening Exercises RW4 with red tband 3x10 each   Other ROM/Strengthening Exercises wall push ups 2x15                     PT Long Term Goals - 02/20/15 0946    PT LONG TERM GOAL #1   Title Ind with an advanced HEP.   Time 6   Period Weeks   Status Achieved   PT LONG TERM GOAL #2   Title Return to golfing with pain not > 2/10   Time 6   Period Weeks   Status On-going  Patient is not attempting full swings at golf course yet.   PT LONG TERM GOAL #3   Title Peform all ADL's with pain not > 2/10   Time 6   Period Weeks   Status Achieved               Plan - 03/01/15 1110    Clinical Impression Statement pt continues to progress and reports no soreness at all the past few days. pt progressing with ther ex and will be contiuing out visits per pt. goal ongoing.   Rehab Potential Excellent   PT Frequency 2x / week   PT Duration 6 weeks   PT Treatment/Interventions Moist Heat;Therapeutic activities;Therapeutic  exercise;Electrical Stimulation;Ultrasound;Manual techniques   PT Next Visit Plan cont with POC   Consulted and Agree with Plan of Care Patient        Problem List There are no active problems to display for this patient.   Phillips Climes, PTA 03/01/2015, 11:13 AM  [facility] [address] [place], [place], [address] Phone: [phone]   Fax:  [phone]

## 2015-03-06 ENCOUNTER — Encounter: Payer: Managed Care, Other (non HMO) | Admitting: *Deleted

## 2015-03-08 ENCOUNTER — Ambulatory Visit: Payer: 59 | Attending: Family Medicine | Admitting: *Deleted

## 2015-03-08 ENCOUNTER — Encounter: Payer: Self-pay | Admitting: *Deleted

## 2015-03-08 DIAGNOSIS — M25511 Pain in right shoulder: Secondary | ICD-10-CM

## 2015-03-08 DIAGNOSIS — M1990 Unspecified osteoarthritis, unspecified site: Secondary | ICD-10-CM | POA: Insufficient documentation

## 2015-03-08 DIAGNOSIS — E785 Hyperlipidemia, unspecified: Secondary | ICD-10-CM | POA: Insufficient documentation

## 2015-03-08 NOTE — Therapy (Signed)
[facility] [place], [place], [address] Phone: [phone]   Fax:  [phone]  Physical Therapy Treatment  Patient Details  Name: Travis Wood MRN: [id number] Date of Birth: November 14, 1951 Referring Provider:  Claretta Fraise, MD  Encounter Date: 03/08/2015      PT End of Session - 03/08/15 1108    Visit Number 9   Number of Visits 12   PT Start Time 0092   PT Stop Time 1116   PT Time Calculation (min) 46 min      Past Medical History  Diagnosis Date  . History of colon polyps   . Osteoarthritis   . Hyperlipemia   . Erectile dysfunction     Past Surgical History  Procedure Laterality Date  . Appendectomy    . Knee surgery      Left x 2     There were no vitals filed for this visit.  Visit Diagnosis:  Right shoulder pain      Subjective Assessment - 03/08/15 1044    Subjective Have been hitting some golf balls lately with no increased RT shldr pain   Pertinent History Slipped and fell onto right shoulder.   Patient Stated Goals Want to golf without pain.   Currently in Pain? No/denies   Pain Location Shoulder   Pain Orientation Right   Pain Descriptors / Indicators Sore   Pain Type Chronic pain   Pain Onset More than a month ago   Pain Frequency Intermittent   Aggravating Factors  golf   Pain Relieving Factors rest                       [facility] Adult PT Treatment/Exercise - 03/08/15 0001    Exercises   Exercises Shoulder   Shoulder Exercises: Prone   Flexion AROM;Strengthening;Right;10 reps   Flexion Weight (lbs) 3#   Extension AROM;Strengthening;Right;10 reps;Weights   Extension Weight (lbs) 3#   Horizontal ABduction 1 Strengthening;Right;10 reps;Weights   Horizontal ABduction 1 Weight (lbs) 3#   Shoulder Exercises: Standing   Horizontal ABduction AROM;Strengthening;Right;10 reps   External Rotation Weight (lbs) arm at 90 degrees scaption 2# ball 3x 10 to 15   Flexion  AROM;Strengthening;Right;10 reps;Weights   Shoulder Flexion Weight (lbs) 3#   Other Standing Exercises PNF D1/D2 4# 2 sets 10 reps   Other Standing Exercises scaption 3# 3x10   Shoulder Exercises: ROM/Strengthening   UBE (Upper Arm Bike) 90 RPMs x 8 mins   Other ROM/Strengthening Exercises 3# bicep curl with overhead press 3x10   Other ROM/Strengthening Exercises wall push ups 3x10                     PT Long Term Goals - 03/08/15 1109    PT LONG TERM GOAL #1   Title Ind with an advanced HEP.   Time 6   Period Weeks   Status Achieved   PT LONG TERM GOAL #2   Title Return to golfing with pain not > 2/10   Time 6   Period Weeks   Status On-going   PT LONG TERM GOAL #3   Title Peform all ADL's with pain not > 2/10   Time 6   Period Weeks   Status Achieved               Plan - 03/08/15 1109    Clinical Impression Statement pt continues to progress towards goals and has met all, but returning to golf. He  is 60-70 % better   Rehab Potential Excellent   PT Frequency 2x / week   PT Duration 6 weeks   PT Treatment/Interventions Moist Heat;Therapeutic activities;Therapeutic exercise;Electrical Stimulation;Ultrasound;Manual techniques   PT Next Visit Plan cont with POC for 3 more visits   Consulted and Agree with Plan of Care Patient        Problem List There are no active problems to display for this patient.   Jakeline Dave,CHRIS,PTA 03/08/2015, 12:23 PM  [facility] [address] [place], [place], [address] Phone: [phone]   Fax:  [phone]

## 2015-03-13 ENCOUNTER — Ambulatory Visit: Payer: 59 | Admitting: *Deleted

## 2015-03-13 ENCOUNTER — Encounter: Payer: Self-pay | Admitting: *Deleted

## 2015-03-13 DIAGNOSIS — M25511 Pain in right shoulder: Secondary | ICD-10-CM | POA: Diagnosis not present

## 2015-03-13 NOTE — Therapy (Signed)
[facility] [place], [place], [address] Phone: [phone]   Fax:  [phone]  Physical Therapy Treatment  Patient Details  Name: Travis Wood MRN: [id number] Date of Birth: 03/16/1951 Referring Provider:  Claretta Fraise, MD  Encounter Date: 03/13/2015      PT End of Session - 03/13/15 1308    Visit Number 10   Number of Visits 12   PT Start Time 1115   PT Stop Time 1200   PT Time Calculation (min) 45 min      Past Medical History  Diagnosis Date  . History of colon polyps   . Osteoarthritis   . Hyperlipemia   . Erectile dysfunction     Past Surgical History  Procedure Laterality Date  . Appendectomy    . Knee surgery      Left x 2     There were no vitals filed for this visit.  Visit Diagnosis:  Right shoulder pain      Subjective Assessment - 03/13/15 1129    Subjective RT shldr no pain today.  Will swing the golf clubs some this week   Pertinent History Slipped and fell onto right shoulder.   Patient Stated Goals Want to golf without pain.   Currently in Pain? No/denies   Pain Location Shoulder   Pain Orientation Right   Pain Descriptors / Indicators Sore   Pain Type Chronic pain   Pain Onset More than a month ago   Aggravating Factors  golf   Pain Relieving Factors rest                       [facility] Adult PT Treatment/Exercise - 03/13/15 0001    Exercises   Exercises Shoulder   Shoulder Exercises: Prone   Flexion Strengthening;Right   Flexion Weight (lbs) 4# ball   Flexion Limitations 3x10   Extension Strengthening   Extension Weight (lbs) 4# ball   Extension Limitations 3x10   Horizontal ABduction 1 Strengthening;Right   Horizontal ABduction 1 Weight (lbs) 4# ball   Shoulder Exercises: Standing   External Rotation Weight (lbs) arm at 90 degrees scaption  4# 3x 10 to 15   Other Standing Exercises PNF D1/D2 4# 2 sets 10 reps   Other Standing Exercises 2# ball golf swing    Shoulder Exercises: ROM/Strengthening   UBE (Upper Arm Bike) 90 RPMs x 8 mins   Other ROM/Strengthening Exercises 4# bicep curl with overhead press 3x10   Other ROM/Strengthening Exercises wall push ups 3x10   Shoulder Exercises: Body Blade   Other Body Blade Exercises Flexion,ext and IR/ER to fatigue 2 bouts each in standing                     PT Long Term Goals - 03/08/15 1109    PT LONG TERM GOAL #1   Title Ind with an advanced HEP.   Time 6   Period Weeks   Status Achieved   PT LONG TERM GOAL #2   Title Return to golfing with pain not > 2/10   Time 6   Period Weeks   Status On-going   PT LONG TERM GOAL #3   Title Peform all ADL's with pain not > 2/10   Time 6   Period Weeks   Status Achieved               Plan - 03/13/15 1312    Rehab Potential Excellent   PT Frequency  2x / week   PT Duration 6 weeks   PT Treatment/Interventions Moist Heat;Therapeutic activities;Therapeutic exercise;Electrical Stimulation;Ultrasound;Manual techniques   PT Next Visit Plan cont with POC for 2 more visits   PT Home Exercise Plan RW4,side lying er   Consulted and Agree with Plan of Care Patient        Problem List There are no active problems to display for this patient.   Venessa Wickham,CHRIS, PTA 03/13/2015, 1:16 PM  [facility] [address] [place], [place], [address] Phone: [phone]   Fax:  [phone]

## 2015-03-15 ENCOUNTER — Ambulatory Visit: Payer: 59 | Admitting: *Deleted

## 2015-03-15 ENCOUNTER — Encounter: Payer: Self-pay | Admitting: *Deleted

## 2015-03-15 DIAGNOSIS — M25511 Pain in right shoulder: Secondary | ICD-10-CM | POA: Diagnosis not present

## 2015-03-15 NOTE — Therapy (Signed)
[facility] [place], [place], [address] Phone: [phone]   Fax:  [phone]  Physical Therapy Treatment  Patient Details  Name: Travis Wood MRN: [id number] Date of Birth: 1951/10/08 Referring Provider:  Claretta Fraise, MD  Encounter Date: 03/15/2015      PT End of Session - 03/15/15 1200    Visit Number 11   Number of Visits 12   PT Start Time 6270   PT Stop Time 3500   PT Time Calculation (min) 44 min      Past Medical History  Diagnosis Date  . History of colon polyps   . Osteoarthritis   . Hyperlipemia   . Erectile dysfunction     Past Surgical History  Procedure Laterality Date  . Appendectomy    . Knee surgery      Left x 2     There were no vitals filed for this visit.  Visit Diagnosis:  Right shoulder pain      Subjective Assessment - 03/15/15 1146    Subjective RT shldr no pain today.  Will swing the golf clubs some this week   Pertinent History Slipped and fell onto right shoulder.   Patient Stated Goals Want to golf without pain.   Currently in Pain? No/denies   Pain Location Shoulder   Pain Orientation Right   Pain Frequency Intermittent   Aggravating Factors  golf   Pain Relieving Factors rest                       [facility] Adult PT Treatment/Exercise - 03/15/15 0001    Shoulder Exercises: Prone   Flexion Strengthening;Right   Flexion Weight (lbs) 4# ball   Flexion Limitations 3x10   Extension Strengthening   Extension Weight (lbs) 4# ball   Extension Limitations 3x10   Horizontal ABduction 1 Strengthening;Right   Horizontal ABduction 1 Weight (lbs) 4# ball   Shoulder Exercises: Standing   External Rotation Weight (lbs) arm at 90 degrees scaption  2# 3x 10 to 15   Flexion AROM;Strengthening;Right;10 reps;Weights   Shoulder Flexion Weight (lbs) XTS pink punches 3x10   Row Strengthening;Right;10 reps;Theraband   Theraband Level (Shoulder Row) Level 3 (Green)  XTS  pink   Retraction Strengthening;Both;10 reps;Theraband   Other Standing Exercises PNF D1/D2 4# 2 sets 10 reps   Other Standing Exercises 2# ball golf swing   Shoulder Exercises: ROM/Strengthening   UBE (Upper Arm Bike) 90 RPMs x 8 mins   Other ROM/Strengthening Exercises 4# bicep curl with overhead press 3x10   Other ROM/Strengthening Exercises wall push ups 3x15                     PT Long Term Goals - 03/15/15 1151    PT LONG TERM GOAL #1   Title Ind with an advanced HEP.   Time 6   Period Weeks   Status Achieved   PT LONG TERM GOAL #2   Title Return to golfing with pain not > 2/10   Time 6   Period Weeks   Status On-going   PT LONG TERM GOAL #3   Title Peform all ADL's with pain not > 2/10   Time 6   Period Weeks   Status Achieved               Problem List There are no active problems to display for this patient.   RAMSEUR,CHRIS, PTA 03/15/2015, 12:01 PM  [facility] [place], [place], [address] Phone: [phone]   Fax:  [phone]

## 2015-03-21 ENCOUNTER — Encounter: Payer: Self-pay | Admitting: Physical Therapy

## 2015-03-21 ENCOUNTER — Ambulatory Visit: Payer: 59 | Admitting: Physical Therapy

## 2015-03-21 DIAGNOSIS — M25511 Pain in right shoulder: Secondary | ICD-10-CM

## 2015-03-21 NOTE — Therapy (Signed)
[facility] [place], [place], [address] Phone: [phone]   Fax:  [phone]  Physical Therapy Treatment  Patient Details  Name: Travis Wood MRN: [id number] Date of Birth: 1951-07-29 Referring Provider:  Claretta Fraise, MD  Encounter Date: 03/21/2015      PT End of Session - 03/21/15 1147    Visit Number 12   Number of Visits 12   PT Start Time 1116   PT Stop Time 1155   PT Time Calculation (min) 39 min      Past Medical History  Diagnosis Date  . History of colon polyps   . Osteoarthritis   . Hyperlipemia   . Erectile dysfunction     Past Surgical History  Procedure Laterality Date  . Appendectomy    . Knee surgery      Left x 2     There were no vitals filed for this visit.  Visit Diagnosis:  Right shoulder pain      Subjective Assessment - 03/21/15 1129    Subjective played golf two different days with no pain or difficulty, no pain only minimal soreness   Currently in Pain? No/denies                         [facility] Adult PT Treatment/Exercise - 03/21/15 0001    Shoulder Exercises: Prone   Other Prone Exercises kneeling for ext/scaption/horiz abd 4# 3x10 each   Shoulder Exercises: Sidelying   External Rotation Strengthening;Right;Weights   External Rotation Weight (lbs) 3#   Shoulder Exercises: Standing   Flexion Strengthening;Both;Theraband   Shoulder Flexion Weight (lbs) PINK XTS punces 3x10   Retraction Strengthening   Theraband Level (Shoulder Retraction) --  PINK XTS 3x10   Other Standing Exercises PNF D1/D2 4# 2 sets 10 reps   Other Standing Exercises 2# ball golf swing   Shoulder Exercises: ROM/Strengthening   UBE (Upper Arm Bike) 90 RPMs x 8 mins   Other ROM/Strengthening Exercises 4# bicep curl with overhead press 3x10   Other ROM/Strengthening Exercises wall push ups 3x15                     PT Long Term Goals - 03/21/15 1147    PT LONG TERM GOAL  #1   Title Ind with an advanced HEP.   Time 6   Period Weeks   Status Achieved   PT LONG TERM GOAL #2   Title Return to golfing with pain not > 2/10   Time 6   Period Weeks   Status Achieved   PT LONG TERM GOAL #3   Title Peform all ADL's with pain not > 2/10   Time 6   Period Weeks   Status Achieved        PHYSICAL THERAPY DISCHARGE SUMMARY  Visits from Start of Care: 12  Current functional level related to goals / functional outcomes: All goals met.   Remaining deficits: None.   Education / Equipment: HEP. Plan: Patient agrees to discharge.  Patient goals were met. Patient is being discharged due to meeting the stated rehab goals.  ?????             Plan - 03/21/15 1152    Clinical Impression Statement patient has met all current goals. is independent with HEP. has no difficulty or pain with ADL's or golf.   PT Next Visit Plan DC per patient and MPT   Consulted and Agree with Plan  of Care Patient        Problem List There are no active problems to display for this patient.   Jonatha Gagen, [place] MPT 03/21/2015, 2:05 PM  [facility] [address] [place], [place], [address] Phone: [phone]   Fax:  [phone]

## 2015-03-21 NOTE — Therapy (Signed)
[facility] [place], [place], [address] Phone: [phone]   Fax:  [phone]  Physical Therapy Treatment  Patient Details  Name: Travis Wood MRN: [id number] Date of Birth: [birth year] Referring Provider:  Claretta Fraise, MD  Encounter Date: 03/21/2015      PT End of Session - 03/21/15 1147    Visit Number 12   Number of Visits 12   PT Start Time 1116   PT Stop Time 1155   PT Time Calculation (min) 39 min      Past Medical History  Diagnosis Date  . History of colon polyps   . Osteoarthritis   . Hyperlipemia   . Erectile dysfunction     Past Surgical History  Procedure Laterality Date  . Appendectomy    . Knee surgery      Left x 2     There were no vitals filed for this visit.  Visit Diagnosis:  Right shoulder pain      Subjective Assessment - 03/21/15 1129    Subjective played golf two different days with no pain or difficulty, no pain only minimal soreness   Currently in Pain? No/denies                         [facility] Adult PT Treatment/Exercise - 03/21/15 0001    Shoulder Exercises: Prone   Other Prone Exercises kneeling for ext/scaption/horiz abd 4# 3x10 each   Shoulder Exercises: Sidelying   External Rotation Strengthening;Right;Weights   External Rotation Weight (lbs) 3#   Shoulder Exercises: Standing   Flexion Strengthening;Both;Theraband   Shoulder Flexion Weight (lbs) PINK XTS punces 3x10   Retraction Strengthening   Theraband Level (Shoulder Retraction) --  PINK XTS 3x10   Other Standing Exercises PNF D1/D2 4# 2 sets 10 reps   Other Standing Exercises 2# ball golf swing   Shoulder Exercises: ROM/Strengthening   UBE (Upper Arm Bike) 90 RPMs x 8 mins   Other ROM/Strengthening Exercises 4# bicep curl with overhead press 3x10   Other ROM/Strengthening Exercises wall push ups 3x15                     PT Long Term Goals - 03/21/15 1147    PT LONG TERM GOAL  #1   Title Ind with an advanced HEP.   Time 6   Period Weeks   Status Achieved   PT LONG TERM GOAL #2   Title Return to golfing with pain not > 2/10   Time 6   Period Weeks   Status Achieved   PT LONG TERM GOAL #3   Title Peform all ADL's with pain not > 2/10   Time 6   Period Weeks   Status Achieved               Plan - 03/21/15 1152    Clinical Impression Statement patient has met all current goals. is independent with HEP. has no difficulty or pain with ADL's or golf.   PT Next Visit Plan DC per patient and MPT   Consulted and Agree with Plan of Care Patient        Problem List There are no active problems to display for this patient.  Ladean Raya, PTA 03/21/2015 11:58 AM Stefanos Haynesworth, Venetia Maxon, PTA 03/21/2015, 11:56 AM  [facility] [address] [place], [place], [address] Phone: [phone]   Fax:  [phone]

## 2015-04-12 ENCOUNTER — Ambulatory Visit (INDEPENDENT_AMBULATORY_CARE_PROVIDER_SITE_OTHER): Payer: 59 | Admitting: Physician Assistant

## 2015-04-12 ENCOUNTER — Encounter: Payer: Self-pay | Admitting: Physician Assistant

## 2015-04-12 VITALS — BP 151/82 | HR 67 | Temp 99.4°F | Ht 74.5 in | Wt 217.0 lb

## 2015-04-12 DIAGNOSIS — J209 Acute bronchitis, unspecified: Secondary | ICD-10-CM | POA: Diagnosis not present

## 2015-04-12 DIAGNOSIS — R062 Wheezing: Secondary | ICD-10-CM

## 2015-04-12 DIAGNOSIS — J309 Allergic rhinitis, unspecified: Secondary | ICD-10-CM

## 2015-04-12 MED ORDER — LEVOFLOXACIN 500 MG PO TABS: 500.0000 mg | ORAL_TABLET | Freq: Every day | ORAL | Status: DC

## 2015-04-12 MED ORDER — PREDNISONE 10 MG (21) PO TBPK: ORAL_TABLET | ORAL | Status: DC

## 2015-04-12 MED ORDER — CETIRIZINE HCL 10 MG PO TABS: 10.0000 mg | ORAL_TABLET | Freq: Every day | ORAL | Status: DC

## 2015-04-12 MED ORDER — ALBUTEROL SULFATE HFA 108 (90 BASE) MCG/ACT IN AERS: INHALATION_SPRAY | RESPIRATORY_TRACT | Status: DC

## 2015-04-12 NOTE — Progress Notes (Signed)
   Subjective:    Patient ID: Travis Wood, male    DOB: Jun 17, 1951, 64 y.o.   MRN: [id number]  HPI 64 y/o male presents with c/o wheezing, chest congestion  x 5 days. Has tried alka seltzer plus with relief of nasal congestion.     Review of Systems  Constitutional: Positive for fever (low grade ), chills and diaphoresis.  HENT: Positive for congestion (chest ), postnasal drip and rhinorrhea. Negative for sneezing and sore throat.   Eyes: Negative.   Respiratory: Positive for cough and wheezing. Negative for chest tightness and shortness of breath.   Cardiovascular: Negative.   All other systems reviewed and are negative.      Objective:   Physical Exam  Constitutional: He is oriented to person, place, and time. He appears well-developed and well-nourished. No distress.  HENT:  Head: Normocephalic.  Mouth/Throat: Oropharynx is clear and moist.  Cardiovascular: Normal rate, regular rhythm and normal heart sounds.  Exam reveals no gallop and no friction rub.   No murmur heard. Pulmonary/Chest: Effort normal. No respiratory distress. He has wheezes (generalized throughout on bilateral lobes ). He exhibits no tenderness.  Neurological: He is alert and oriented to person, place, and time.  Skin: He is not diaphoretic.  Psychiatric: He has a normal mood and affect. His behavior is normal. Judgment and thought content normal.  Nursing note and vitals reviewed.         Assessment & Plan:  1. Wheezing  - predniSONE (STERAPRED UNI-PAK 21 TAB) 10 MG (21) TBPK tablet; Take as directed  Dispense: 21 tablet; Refill: 0 - levofloxacin (LEVAQUIN) 500 MG tablet; Take 1 tablet (500 mg total) by mouth daily.  Dispense: 7 tablet; Refill: 0 - cetirizine (ZYRTEC) 10 MG tablet; Take 1 tablet (10 mg total) by mouth daily.  Dispense: 30 tablet; Refill: 11 - albuterol (PROVENTIL HFA;VENTOLIN HFA) 108 (90 BASE) MCG/ACT inhaler; Inhale 2 puffs q 6 hrs for wheezing  Dispense: 1 Inhaler; Refill: 1  2.  Acute bronchitis, unspecified organism  - predniSONE (STERAPRED UNI-PAK 21 TAB) 10 MG (21) TBPK tablet; Take as directed  Dispense: 21 tablet; Refill: 0 - levofloxacin (LEVAQUIN) 500 MG tablet; Take 1 tablet (500 mg total) by mouth daily.  Dispense: 7 tablet; Refill: 0 - cetirizine (ZYRTEC) 10 MG tablet; Take 1 tablet (10 mg total) by mouth daily.  Dispense: 30 tablet; Refill: 11 - albuterol (PROVENTIL HFA;VENTOLIN HFA) 108 (90 BASE) MCG/ACT inhaler; Inhale 2 puffs q 6 hrs for wheezing  Dispense: 1 Inhaler; Refill: 1  3. Allergic rhinitis, unspecified allergic rhinitis type  - cetirizine (ZYRTEC) 10 MG tablet; Take 1 tablet (10 mg total) by mouth daily.  Dispense: 30 tablet; Refill: 11   Continue all meds Labs pending Health Maintenance reviewed Diet and exercise encouraged RTO 1 week  Onita Pfluger A. Benjamin Stain PA-C

## 2015-04-12 NOTE — Addendum Note (Signed)
Addended by: Marylin Crosby on: 04/12/2015 06:00 PM   Modules accepted: Orders

## 2015-04-12 NOTE — Patient Instructions (Signed)
Acute Bronchitis Bronchitis is when the airways that extend from the windpipe into the lungs get red, puffy, and painful (inflamed). Bronchitis often causes thick spit (mucus) to develop. This leads to a cough. A cough is the most common symptom of bronchitis. In acute bronchitis, the condition usually begins suddenly and goes away over time (usually in 2 weeks). Smoking, allergies, and asthma can make bronchitis worse. Repeated episodes of bronchitis may cause more lung problems. HOME CARE  Rest.  Drink enough fluids to keep your pee (urine) clear or pale yellow (unless you need to limit fluids as told by your doctor).  Only take over-the-counter or prescription medicines as told by your doctor.  Avoid smoking and secondhand smoke. These can make bronchitis worse. If you are a smoker, think about using nicotine gum or skin patches. Quitting smoking will help your lungs heal faster.  Reduce the chance of getting bronchitis again by:  Washing your hands often.  Avoiding people with cold symptoms.  Trying not to touch your hands to your mouth, nose, or eyes.  Follow up with your doctor as told. GET HELP IF: Your symptoms do not improve after 1 week of treatment. Symptoms include:  Cough.  Fever.  Coughing up thick spit.  Body aches.  Chest congestion.  Chills.  Shortness of breath.  Sore throat. GET HELP RIGHT AWAY IF:   You have an increased fever.  You have chills.  You have severe shortness of breath.  You have bloody thick spit (sputum).  You throw up (vomit) often.  You lose too much body fluid (dehydration).  You have a severe headache.  You faint. MAKE SURE YOU:   Understand these instructions.  Will watch your condition.  Will get help right away if you are not doing well or get worse. Document Released: 05/05/2008 Document Revised: 07/20/2013 Document Reviewed: 05/10/2013 Exit[employer] Patient Information 2015 [employer]. This information is not  intended to replace advice given to you by your health care provider. Make sure you discuss any questions you have with your health care provider.  

## 2015-04-19 ENCOUNTER — Ambulatory Visit (INDEPENDENT_AMBULATORY_CARE_PROVIDER_SITE_OTHER): Payer: 59 | Admitting: Physician Assistant

## 2015-04-19 ENCOUNTER — Ambulatory Visit (INDEPENDENT_AMBULATORY_CARE_PROVIDER_SITE_OTHER): Payer: 59

## 2015-04-19 ENCOUNTER — Encounter: Payer: Self-pay | Admitting: Physician Assistant

## 2015-04-19 VITALS — BP 112/70 | HR 69 | Temp 97.2°F | Ht 74.5 in | Wt 222.8 lb

## 2015-04-19 DIAGNOSIS — R062 Wheezing: Secondary | ICD-10-CM | POA: Diagnosis not present

## 2015-04-19 DIAGNOSIS — J4531 Mild persistent asthma with (acute) exacerbation: Secondary | ICD-10-CM | POA: Diagnosis not present

## 2015-04-19 MED ORDER — BUDESONIDE-FORMOTEROL FUMARATE 160-4.5 MCG/ACT IN AERO: 2.0000 | INHALATION_SPRAY | Freq: Two times a day (BID) | RESPIRATORY_TRACT | Status: DC

## 2015-04-19 NOTE — Progress Notes (Signed)
   Subjective:    Patient ID: Travis Wood, male    DOB: 1951-03-31, 64 y.o.   MRN: [id number]  HPI 64 y/o male presents for 1 week f/u of bronchitis. He has finished his Levaquin. Still has some of the prednisone left. He has been using the albuterol q 6 hrs. He states that he is feeling much better but still has a cough and wheeze occasionally. Did not take the musinex as directed.     Review of Systems  Respiratory: Positive for cough and wheezing.   All other systems reviewed and are negative.      Objective:   Physical Exam  Constitutional: He is oriented to person, place, and time. He appears well-developed and well-nourished. No distress.  HENT:  Head: Normocephalic.  Cardiovascular: Normal rate, regular rhythm and normal heart sounds.  Exam reveals no gallop and no friction rub.   No murmur heard. Pulmonary/Chest: Effort normal. No respiratory distress. He has wheezes (generalized expiratory throughout). He has no rales. He exhibits no tenderness.  Neurological: He is alert and oriented to person, place, and time.  Skin: He is not diaphoretic.  Psychiatric: He has a normal mood and affect. His behavior is normal. Judgment and thought content normal.  Nursing note and vitals reviewed.         Assessment & Plan:  1. Wheeze  - DG Chest 2 View- clear, WNL - budesonide-formoterol (SYMBICORT) 160-4.5 MCG/ACT inhaler; Inhale 2 puffs into the lungs 2 (two) times daily.  Dispense: 1 Inhaler; Refill: 3  2. Asthma with acute exacerbation, mild persistent  - budesonide-formoterol (SYMBICORT) 160-4.5 MCG/ACT inhaler; Inhale 2 puffs into the lungs 2 (two) times daily.  Dispense: 1 Inhaler; Refill: 3 - Continue albuterol q 6 hours prn until f/u   - Cool mist humidifier - plain mucinex as directed - zyrtec 10mg  daily    F/U 4 weeks    Saajan Willmon A. Benjamin Stain PA-C

## 2015-05-17 ENCOUNTER — Ambulatory Visit: Payer: 59 | Admitting: Physician Assistant

## 2015-05-18 ENCOUNTER — Encounter: Payer: Self-pay | Admitting: Physician Assistant

## 2015-05-30 ENCOUNTER — Other Ambulatory Visit (INDEPENDENT_AMBULATORY_CARE_PROVIDER_SITE_OTHER): Payer: 59

## 2015-05-30 DIAGNOSIS — E785 Hyperlipidemia, unspecified: Secondary | ICD-10-CM

## 2015-05-30 DIAGNOSIS — R5383 Other fatigue: Secondary | ICD-10-CM | POA: Diagnosis not present

## 2015-05-30 LAB — POCT CBC
Granulocyte percent: 55.2 %G (ref 37–80)
HCT, POC: 45.7 % (ref 43.5–53.7)
HEMOGLOBIN: 14.3 g/dL (ref 14.1–18.1)
LYMPH, POC: 2.9 (ref 0.6–3.4)
MCH, POC: 29.3 pg (ref 27–31.2)
MCHC: 31.4 g/dL — AB (ref 31.8–35.4)
MCV: 93.5 fL (ref 80–97)
MPV: 8.2 fL (ref 0–99.8)
PLATELET COUNT, POC: 191 10*3/uL (ref 142–424)
POC Granulocyte: 4.1 (ref 2–6.9)
POC LYMPH %: 38.8 % (ref 10–50)
RBC: 4.89 M/uL (ref 4.69–6.13)
RDW, POC: 13.2 %
WBC: 7.4 10*3/uL (ref 4.6–10.2)

## 2015-05-30 NOTE — Progress Notes (Signed)
Lab only 

## 2015-05-31 LAB — CMP14+EGFR
ALT: 18 IU/L (ref 0–44)
AST: 14 IU/L (ref 0–40)
Albumin/Globulin Ratio: 1.7 (ref 1.1–2.5)
Albumin: 4 g/dL (ref 3.6–4.8)
Alkaline Phosphatase: 92 IU/L (ref 39–117)
BUN / CREAT RATIO: 12 (ref 10–22)
BUN: 12 mg/dL (ref 8–27)
Bilirubin Total: 0.5 mg/dL (ref 0.0–1.2)
CO2: 27 mmol/L (ref 18–29)
Calcium: 8.9 mg/dL (ref 8.6–10.2)
Chloride: 101 mmol/L (ref 97–108)
Creatinine, Ser: 0.98 mg/dL (ref 0.76–1.27)
GFR calc Af Amer: 94 mL/min/{1.73_m2} (ref >59)
GFR, EST NON AFRICAN AMERICAN: 82 mL/min/{1.73_m2} (ref >59)
Globulin, Total: 2.3 g/dL (ref 1.5–4.5)
Glucose: 98 mg/dL (ref 65–99)
POTASSIUM: 5.1 mmol/L (ref 3.5–5.2)
SODIUM: 141 mmol/L (ref 134–144)
Total Protein: 6.3 g/dL (ref 6.0–8.5)

## 2015-05-31 LAB — THYROID PANEL WITH TSH
Free Thyroxine Index: 2.2 (ref 1.2–4.9)
T3 Uptake Ratio: 29 % (ref 24–39)
T4, Total: 7.5 ug/dL (ref 4.5–12.0)
TSH: 1.75 u[IU]/mL (ref 0.450–4.500)

## 2015-05-31 LAB — VITAMIN D 25 HYDROXY (VIT D DEFICIENCY, FRACTURES): VIT D 25 HYDROXY: 20.1 ng/mL — AB (ref 30.0–100.0)

## 2015-05-31 LAB — LIPID PANEL
Chol/HDL Ratio: 3.3 ratio units (ref 0.0–5.0)
Cholesterol, Total: 159 mg/dL (ref 100–199)
HDL: 48 mg/dL (ref >39)
LDL Calculated: 96 mg/dL (ref 0–99)
Triglycerides: 76 mg/dL (ref 0–149)
VLDL Cholesterol Cal: 15 mg/dL (ref 5–40)

## 2015-06-01 ENCOUNTER — Other Ambulatory Visit: Payer: Self-pay | Admitting: *Deleted

## 2015-06-01 ENCOUNTER — Telehealth: Payer: Self-pay | Admitting: *Deleted

## 2015-06-01 DIAGNOSIS — E559 Vitamin D deficiency, unspecified: Secondary | ICD-10-CM

## 2015-06-01 MED ORDER — VITAMIN D (ERGOCALCIFEROL) 1.25 MG (50000 UNIT) PO CAPS: ORAL_CAPSULE | ORAL | Status: DC

## 2015-06-01 NOTE — Telephone Encounter (Signed)
Aware of new vitamin d script.

## 2015-06-13 ENCOUNTER — Ambulatory Visit (INDEPENDENT_AMBULATORY_CARE_PROVIDER_SITE_OTHER): Payer: 59 | Admitting: Family Medicine

## 2015-06-13 ENCOUNTER — Encounter: Payer: Self-pay | Admitting: Family Medicine

## 2015-06-13 VITALS — BP 120/70 | HR 63 | Temp 97.3°F | Ht 74.0 in | Wt 221.6 lb

## 2015-06-13 DIAGNOSIS — E785 Hyperlipidemia, unspecified: Secondary | ICD-10-CM | POA: Insufficient documentation

## 2015-06-13 DIAGNOSIS — Z1211 Encounter for screening for malignant neoplasm of colon: Secondary | ICD-10-CM | POA: Diagnosis not present

## 2015-06-13 DIAGNOSIS — D171 Benign lipomatous neoplasm of skin and subcutaneous tissue of trunk: Secondary | ICD-10-CM

## 2015-06-13 MED ORDER — VITAMIN D 50 MCG (2000 UT) PO TABS: 2000.0000 [IU] | ORAL_TABLET | Freq: Every day | ORAL | Status: DC

## 2015-06-13 MED ORDER — ATORVASTATIN CALCIUM 40 MG PO TABS: 40.0000 mg | ORAL_TABLET | Freq: Every day | ORAL | Status: DC

## 2015-06-13 NOTE — Progress Notes (Signed)
Subjective:  Patient ID: Travis Wood, male    DOB: 11/07/1951  Age: 64 y.o. MRN: [id number]  CC: Hyperlipidemia   HPI Beaux Wedemeyer presents for follow-up of elevated cholesterol. Doing well without complaints on current medication. Denies side effects of statin including myalgia and arthralgia and nausea. Also in today for liver function testing. Had labs predraawn several days ago. Results attached are reviewed with pt. Today. Currently no chest pain, shortness of breath or other cardiovascular related symptoms noted.  Lesion on back present for years is not painful but is enlarging. Requests removal. Also requests screening colonoscopy.  History Travis Wood has a past medical history of History of colon polyps; Osteoarthritis; Hyperlipemia; and Erectile dysfunction.   He has past surgical history that includes Appendectomy and Knee surgery.   His family history includes Cancer in his sister; Colon cancer in his father; Colon polyps in his father; Heart disease in his father and mother.He reports that he has never smoked. He has never used smokeless tobacco. He reports that he does not drink alcohol or use illicit drugs.  Current Outpatient Prescriptions on File Prior to Visit  Medication Sig Dispense Refill  . albuterol (PROVENTIL HFA;VENTOLIN HFA) 108 (90 BASE) MCG/ACT inhaler Inhale 2 puffs q 6 hrs for wheezing 1 Inhaler 1  . budesonide-formoterol (SYMBICORT) 160-4.5 MCG/ACT inhaler Inhale 2 puffs into the lungs 2 (two) times daily. 1 Inhaler 3  . cetirizine (ZYRTEC) 10 MG tablet Take 1 tablet (10 mg total) by mouth daily. 30 tablet 11  . diclofenac (VOLTAREN) 75 MG EC tablet Take 1 tablet (75 mg total) by mouth 2 (two) times daily. 60 tablet 2  . sildenafil (VIAGRA) 100 MG tablet Take 100 mg by mouth as needed. Take 1 hour before sex    . Vitamin D, Ergocalciferol, (DRISDOL) 50000 UNITS CAPS capsule Take one cap twice weekly 16 capsule 1   Current Facility-Administered Medications on File  Prior to Visit  Medication Dose Route Frequency Provider Last Rate Last Dose  . pneumococcal 23 valent vaccine (PNU-IMMUNE) injection 0.5 mL  0.5 mL Intramuscular Once Claretta Fraise, MD        ROS Review of Systems  Constitutional: Negative for fever, chills and diaphoresis.  HENT: Negative for congestion, rhinorrhea and sore throat.   Respiratory: Negative for cough, shortness of breath and wheezing.   Cardiovascular: Negative for chest pain.  Gastrointestinal: Negative for nausea, vomiting, abdominal pain, diarrhea, constipation and abdominal distention.  Genitourinary: Negative for dysuria and frequency.  Musculoskeletal: Negative for joint swelling and arthralgias.  Skin: Negative for rash.  Neurological: Negative for headaches.    Objective:  BP 120/70 mmHg  Pulse 63  Temp(Src) 97.3 F (36.3 C) (Oral)  Ht 6\' 2"  (1.88 m)  Wt 221 lb 9.6 oz (100.517 kg)  BMI 28.44 kg/m2  BP Readings from Last 3 Encounters:  06/13/15 120/70  04/19/15 112/70  04/12/15 151/82    Wt Readings from Last 3 Encounters:  06/13/15 221 lb 9.6 oz (100.517 kg)  04/19/15 222 lb 12.8 oz (101.061 kg)  04/12/15 217 lb (98.431 kg)     Physical Exam  Constitutional: He appears well-developed and well-nourished.  HENT:  Head: Normocephalic and atraumatic.  Right Ear: No decreased hearing is noted.  Left Ear: No decreased hearing is noted.  Mouth/Throat: No oropharyngeal exudate or posterior oropharyngeal erythema.  Eyes: Pupils are equal, round, and reactive to light.  Neck: Normal range of motion. Neck supple.  Cardiovascular: Normal rate and regular rhythm.  No murmur heard. Pulmonary/Chest: Breath sounds normal. No respiratory distress.  Abdominal: Soft. Bowel sounds are normal. He exhibits no mass. There is no tenderness.  Skin: Skin is warm and dry.     Vitals reviewed.     Lab Results  Component Value Date   WBC 7.4 05/30/2015   HGB 14.3 05/30/2015   HCT 45.7 05/30/2015    GLUCOSE 98 05/30/2015   CHOL 159 05/30/2015   TRIG 76 05/30/2015   HDL 48 05/30/2015   LDLCALC 96 05/30/2015   ALT 18 05/30/2015   AST 14 05/30/2015   NA 141 05/30/2015   K 5.1 05/30/2015   CL 101 05/30/2015   CREATININE 0.98 05/30/2015   BUN 12 05/30/2015   CO2 27 05/30/2015   TSH 1.750 05/30/2015   PSA 1.8 02/20/2015    Patient was never admitted.  Assessment & Plan:   Elad was seen today for hyperlipidemia.  Diagnoses and all orders for this visit:  Lipoma of back Orders: -     Ambulatory referral to Plastic Surgery  Special screening for malignant neoplasms, colon Orders: -     [facility] COLONOSCOPY  Hyperlipidemia  Other orders -     Cholecalciferol (VITAMIN D) 2000 UNITS tablet; Take 1 tablet (2,000 Units total) by mouth daily. -     atorvastatin (LIPITOR) 40 MG tablet; Take 1 tablet (40 mg total) by mouth daily. For cholesterol   I have discontinued Mr. Odette predniSONE. I am also having him start on Vitamin D. Additionally, I am having him maintain his sildenafil, diclofenac, cetirizine, albuterol, budesonide-formoterol, Vitamin D (Ergocalciferol), and atorvastatin. We will continue to administer pneumococcal 23 valent vaccine.  Meds ordered this encounter  Medications  . Cholecalciferol (VITAMIN D) 2000 UNITS tablet    Sig: Take 1 tablet (2,000 Units total) by mouth daily.    Dispense:  30 tablet    Refill:  11  . atorvastatin (LIPITOR) 40 MG tablet    Sig: Take 1 tablet (40 mg total) by mouth daily. For cholesterol    Dispense:  90 tablet    Refill:  3     Follow-up: Return in about 6 months (around 12/14/2015).  Claretta Fraise, M.D.

## 2015-08-14 NOTE — H&P (Signed)
  NTS SOAP Note  Vital Signs:  Vitals as of: 4/85/4627: Systolic 035: Diastolic 75: Heart Rate 50: Temp 97.59F: Height 88ft 3in: Weight 221Lbs 0 Ounces: BMI 27.62  BMI : 27.62 kg/m2  Subjective: This 64 year old male presents for of a growth along his lower back.  Has been present for over a year, but is increasing in size and causing him pain when lying on his back.  Review of Symptoms:  Constitutional:unremarkable   Head:unremarkable Eyes:unremarkable   Nose/Mouth/Throat:unremarkable Cardiovascular:  unremarkable Respiratory:unremarkable Gastrointestinal:  unremarkable   Genitourinary:unremarkable   Musculoskeletal:unremarkable Hematolgic/Lymphatic:unremarkable   Allergic/Immunologic:unremarkable   Past Medical History:  Reviewed  Past Medical History  Surgical History: knee surgery Medical Problems: none Allergies: nkda Medications: none   Social History:Reviewed  Social History  Preferred Language: English Race:  White Ethnicity: Not Hispanic / Latino Age: 10 year Marital Status:  S Alcohol: no   Smoking Status: Never smoker reviewed on 08/14/2015 Functional Status reviewed on 08/14/2015 ------------------------------------------------ Bathing: Normal Cooking: Normal Dressing: Normal Driving: Normal Eating: Normal Managing Meds: Normal Oral Care: Normal Shopping: Normal Toileting: Normal Transferring: Normal Walking: Normal Cognitive Status reviewed on 08/14/2015 ------------------------------------------------ Attention: Normal Decision Making: Normal Language: Normal Memory: Normal Motor: Normal Perception: Normal Problem Solving: Normal Visual and Spatial: Normal   Family History:Reviewed  Family Health History Mother, Healthy;  Father, Healthy;     Objective Information: Heart:RRR, no murmur or gallop.  Normal S1, S2.  No S3, S4.  Lungs:  CTA bilaterally, no wheezes, rhonchi, rales.  Breathing  unlabored. 6cm ovoid rubbery tender subcutaneous mass at the lower back.  No drainage, erythema noted.  Assessment:Subcutaneous neoplasm, back  Diagnoses: 239.2  D49.2 Neoplasm of soft tissue (Neoplasm of unspecified behavior of bone, soft tissue, and skin)  Procedures: 00938 - OFFICE OUTPATIENT NEW 30 MINUTES    Plan:  Scheduled for excision of neoplasm, soft tissue, back on 09/12/15.   Patient Education:Alternative treatments to surgery were discussed with patient (and family).  Risks and benefits  of procedure were fully explained to the patient (and family) who gave informed consent. Patient/family questions were addressed.  Follow-up:Pending Surgery

## 2015-08-20 ENCOUNTER — Telehealth: Payer: Self-pay | Admitting: Family Medicine

## 2015-09-06 NOTE — Patient Instructions (Signed)
      Travis Wood  09/06/2015     @PREFPERIOPPHARMACY @   Your procedure is scheduled on  09/10/2015   Report to [facility] at  700 A.M.  Call this number if you have problems the morning of surgery:  [phone]   Remember:  Do not eat food or drink liquids after midnight.  Take these medicines the morning of surgery with A SIP OF WATER  zyrtec   Do not wear jewelry, make-up or nail polish.  Do not wear lotions, powders, or perfumes.  You may wear deodorant.  Do not shave 48 hours prior to surgery.  Men may shave face and neck.  Do not bring valuables to the hospital.  [facility] is not responsible for any belongings or valuables.  Contacts, dentures or bridgework may not be worn into surgery.  Leave your suitcase in the car.  After surgery it may be brought to your room.  For patients admitted to the hospital, discharge time will be determined by your treatment team.  Patients discharged the day of surgery will not be allowed to drive home.   Name and phone number of your driver:   family Special instructions:  none  Please read over the following fact sheets that you were given. Pain Booklet, Coughing and Deep Breathing, Surgical Site Infection Prevention, Anesthesia Post-op Instructions and Care and Recovery After Surgery      PATIENT INSTRUCTIONS POST-ANESTHESIA  IMMEDIATELY FOLLOWING SURGERY:  Do not drive or operate machinery for the first twenty four hours after surgery.  Do not make any important decisions for twenty four hours after surgery or while taking narcotic pain medications or sedatives.  If you develop intractable nausea and vomiting or a severe headache please notify your doctor immediately.  FOLLOW-UP:  Please make an appointment with your surgeon as instructed. You do not need to follow up with anesthesia unless specifically instructed to do so.  WOUND CARE INSTRUCTIONS (if applicable):  Keep a dry clean dressing on the anesthesia/puncture wound site  if there is drainage.  Once the wound has quit draining you may leave it open to air.  Generally you should leave the bandage intact for twenty four hours unless there is drainage.  If the epidural site drains for more than 36-48 hours please call the anesthesia department.  QUESTIONS?:  Please feel free to call your physician or the hospital operator if you have any questions, and they will be happy to assist you.

## 2015-09-07 ENCOUNTER — Other Ambulatory Visit (HOSPITAL_COMMUNITY): Payer: Managed Care, Other (non HMO)

## 2015-09-10 ENCOUNTER — Other Ambulatory Visit: Payer: Self-pay

## 2015-09-10 ENCOUNTER — Encounter (HOSPITAL_COMMUNITY)
Admission: RE | Admit: 2015-09-10 | Discharge: 2015-09-10 | Disposition: A | Discharge status: Home / Self Care | Payer: 59 | Source: Ambulatory Visit | Attending: General Surgery | Admitting: General Surgery

## 2015-09-10 ENCOUNTER — Encounter (HOSPITAL_COMMUNITY): Payer: Self-pay

## 2015-09-10 DIAGNOSIS — R222 Localized swelling, mass and lump, trunk: Secondary | ICD-10-CM | POA: Diagnosis present

## 2015-09-10 DIAGNOSIS — Z01812 Encounter for preprocedural laboratory examination: Secondary | ICD-10-CM | POA: Diagnosis not present

## 2015-09-10 DIAGNOSIS — Z01810 Encounter for preprocedural cardiovascular examination: Secondary | ICD-10-CM | POA: Diagnosis not present

## 2015-09-10 DIAGNOSIS — Z87891 Personal history of nicotine dependence: Secondary | ICD-10-CM | POA: Diagnosis not present

## 2015-09-10 DIAGNOSIS — L723 Sebaceous cyst: Secondary | ICD-10-CM | POA: Diagnosis not present

## 2015-09-10 HISTORY — DX: Reserved for inherently not codable concepts without codable children: IMO0001

## 2015-09-10 HISTORY — DX: Personal history of other diseases of the respiratory system: Z87.09

## 2015-09-10 LAB — CBC WITH DIFFERENTIAL/PLATELET
Basophils Absolute: 0 10*3/uL (ref 0.0–0.1)
Basophils Relative: 0 %
Eosinophils Absolute: 0.2 10*3/uL (ref 0.0–0.7)
Eosinophils Relative: 2 %
HCT: 42.9 % (ref 39.0–52.0)
HEMOGLOBIN: 14.4 g/dL (ref 13.0–17.0)
LYMPHS ABS: 3.1 10*3/uL (ref 0.7–4.0)
LYMPHS PCT: 40 %
MCH: 31.4 pg (ref 26.0–34.0)
MCHC: 33.6 g/dL (ref 30.0–36.0)
MCV: 93.5 fL (ref 78.0–100.0)
Monocytes Absolute: 0.5 10*3/uL (ref 0.1–1.0)
Monocytes Relative: 6 %
NEUTROS ABS: 4.1 10*3/uL (ref 1.7–7.7)
NEUTROS PCT: 52 %
Platelets: 175 10*3/uL (ref 150–400)
RBC: 4.59 MIL/uL (ref 4.22–5.81)
RDW: 12.5 % (ref 11.5–15.5)
WBC: 7.9 10*3/uL (ref 4.0–10.5)

## 2015-09-10 LAB — BASIC METABOLIC PANEL
Anion gap: 4 — ABNORMAL LOW (ref 5–15)
BUN: 12 mg/dL (ref 6–20)
CHLORIDE: 105 mmol/L (ref 101–111)
CO2: 30 mmol/L (ref 22–32)
Calcium: 8.2 mg/dL — ABNORMAL LOW (ref 8.9–10.3)
Creatinine, Ser: 0.71 mg/dL (ref 0.61–1.24)
GFR calc non Af Amer: >60 mL/min (ref >60)
Glucose, Bld: 101 mg/dL — ABNORMAL HIGH (ref 65–99)
POTASSIUM: 4.6 mmol/L (ref 3.5–5.1)
SODIUM: 139 mmol/L (ref 135–145)

## 2015-09-10 NOTE — Pre-Procedure Instructions (Signed)
Patient given information to sign up for my chart at home. 

## 2015-09-12 ENCOUNTER — Ambulatory Visit (HOSPITAL_COMMUNITY): Payer: 59 | Admitting: Anesthesiology

## 2015-09-12 ENCOUNTER — Encounter (HOSPITAL_COMMUNITY): Payer: Self-pay | Admitting: *Deleted

## 2015-09-12 ENCOUNTER — Encounter (HOSPITAL_COMMUNITY)
Admission: RE | Disposition: A | Discharge status: Home / Self Care | Payer: Self-pay | Source: Ambulatory Visit | Attending: General Surgery

## 2015-09-12 ENCOUNTER — Ambulatory Visit (HOSPITAL_COMMUNITY)
Admission: RE | Admit: 2015-09-12 | Discharge: 2015-09-12 | Disposition: A | Discharge status: Home / Self Care | Payer: 59 | Source: Ambulatory Visit | Attending: General Surgery | Admitting: General Surgery

## 2015-09-12 DIAGNOSIS — Z01812 Encounter for preprocedural laboratory examination: Secondary | ICD-10-CM | POA: Insufficient documentation

## 2015-09-12 DIAGNOSIS — L723 Sebaceous cyst: Secondary | ICD-10-CM | POA: Diagnosis not present

## 2015-09-12 DIAGNOSIS — Z87891 Personal history of nicotine dependence: Secondary | ICD-10-CM | POA: Insufficient documentation

## 2015-09-12 DIAGNOSIS — Z01810 Encounter for preprocedural cardiovascular examination: Secondary | ICD-10-CM | POA: Insufficient documentation

## 2015-09-12 HISTORY — PX: MASS EXCISION: SHX2000

## 2015-09-12 SURGERY — EXCISION MASS
Anesthesia: General

## 2015-09-12 MED ORDER — FENTANYL CITRATE (PF) 100 MCG/2ML IJ SOLN
25.0000 ug | INTRAMUSCULAR | Status: AC
  Administered 2015-09-12 (×2): 25 ug via INTRAVENOUS

## 2015-09-12 MED ORDER — LIDOCAINE HCL (CARDIAC) 10 MG/ML IV SOLN
INTRAVENOUS | Status: DC | PRN
  Administered 2015-09-12: 50 mg via INTRAVENOUS

## 2015-09-12 MED ORDER — HYDROCODONE-ACETAMINOPHEN 5-325 MG PO TABS: 1.0000 | ORAL_TABLET | ORAL | Status: DC | PRN

## 2015-09-12 MED ORDER — PROPOFOL 10 MG/ML IV BOLUS
INTRAVENOUS | Status: AC
Start: 2015-09-12 — End: 2015-09-12
  Filled 2015-09-12: qty 20

## 2015-09-12 MED ORDER — FENTANYL CITRATE (PF) 100 MCG/2ML IJ SOLN
INTRAMUSCULAR | Status: AC
  Filled 2015-09-12: qty 4

## 2015-09-12 MED ORDER — ONDANSETRON HCL 4 MG/2ML IJ SOLN
INTRAMUSCULAR | Status: AC
Start: 2015-09-12 — End: 2015-09-12
  Filled 2015-09-12: qty 2

## 2015-09-12 MED ORDER — MIDAZOLAM HCL 2 MG/2ML IJ SOLN
INTRAMUSCULAR | Status: AC
  Filled 2015-09-12: qty 2

## 2015-09-12 MED ORDER — ONDANSETRON HCL 4 MG/2ML IJ SOLN: 4.0000 mg | Freq: Once | INTRAMUSCULAR | Status: DC | PRN

## 2015-09-12 MED ORDER — LACTATED RINGERS IV SOLN
INTRAVENOUS | Status: DC
  Administered 2015-09-12: 08:00:00 via INTRAVENOUS

## 2015-09-12 MED ORDER — PROPOFOL 10 MG/ML IV BOLUS
INTRAVENOUS | Status: DC | PRN
  Administered 2015-09-12: 200 mg via INTRAVENOUS

## 2015-09-12 MED ORDER — GLYCOPYRROLATE 0.2 MG/ML IJ SOLN
INTRAMUSCULAR | Status: AC
  Filled 2015-09-12: qty 1

## 2015-09-12 MED ORDER — FENTANYL CITRATE (PF) 100 MCG/2ML IJ SOLN
INTRAMUSCULAR | Status: DC | PRN
  Administered 2015-09-12 (×2): 25 ug via INTRAVENOUS

## 2015-09-12 MED ORDER — ROCURONIUM BROMIDE 100 MG/10ML IV SOLN: INTRAVENOUS | Status: DC | PRN

## 2015-09-12 MED ORDER — GLYCOPYRROLATE 0.2 MG/ML IJ SOLN
0.2000 mg | Freq: Once | INTRAMUSCULAR | Status: AC
  Administered 2015-09-12: 0.2 mg via INTRAVENOUS

## 2015-09-12 MED ORDER — POVIDONE-IODINE 10 % EX OINT
TOPICAL_OINTMENT | CUTANEOUS | Status: AC
Start: 2015-09-12 — End: 2015-09-12
  Filled 2015-09-12: qty 1

## 2015-09-12 MED ORDER — FENTANYL CITRATE (PF) 100 MCG/2ML IJ SOLN
INTRAMUSCULAR | Status: AC
  Filled 2015-09-12: qty 2

## 2015-09-12 MED ORDER — MIDAZOLAM HCL 2 MG/2ML IJ SOLN
1.0000 mg | INTRAMUSCULAR | Status: DC | PRN
  Administered 2015-09-12: 2 mg via INTRAVENOUS

## 2015-09-12 MED ORDER — CHLORHEXIDINE GLUCONATE 4 % EX LIQD: 1.0000 "application " | Freq: Once | CUTANEOUS | Status: DC

## 2015-09-12 MED ORDER — FENTANYL CITRATE (PF) 100 MCG/2ML IJ SOLN: 25.0000 ug | INTRAMUSCULAR | Status: DC | PRN

## 2015-09-12 MED ORDER — POVIDONE-IODINE 10 % OINT PACKET
TOPICAL_OINTMENT | CUTANEOUS | Status: DC | PRN
  Administered 2015-09-12: 1 via TOPICAL

## 2015-09-12 MED ORDER — 0.9 % SODIUM CHLORIDE (POUR BTL) OPTIME
TOPICAL | Status: DC | PRN
  Administered 2015-09-12: 1000 mL

## 2015-09-12 MED ORDER — ONDANSETRON HCL 4 MG/2ML IJ SOLN
4.0000 mg | Freq: Once | INTRAMUSCULAR | Status: AC
  Administered 2015-09-12: 4 mg via INTRAVENOUS

## 2015-09-12 MED ORDER — BUPIVACAINE HCL (PF) 0.5 % IJ SOLN
INTRAMUSCULAR | Status: DC | PRN
  Administered 2015-09-12: 6 mL

## 2015-09-12 MED ORDER — LIDOCAINE HCL (PF) 1 % IJ SOLN
INTRAMUSCULAR | Status: AC
  Filled 2015-09-12: qty 5

## 2015-09-12 SURGICAL SUPPLY — 33 items
BAG HAMPER (MISCELLANEOUS) ×2 IMPLANT
CHLORAPREP W/TINT 10.5 ML (MISCELLANEOUS) ×2 IMPLANT
CLOTH BEACON ORANGE TIMEOUT ST (SAFETY) ×2 IMPLANT
COVER LIGHT HANDLE STERIS (MISCELLANEOUS) ×4 IMPLANT
DECANTER SPIKE VIAL GLASS SM (MISCELLANEOUS) ×2 IMPLANT
ELECT REM PT RETURN 9FT ADLT (ELECTROSURGICAL) ×2
ELECTRODE REM PT RTRN 9FT ADLT (ELECTROSURGICAL) ×1 IMPLANT
GAUZE SPONGE 4X4 12PLY STRL (GAUZE/BANDAGES/DRESSINGS) ×2 IMPLANT
GLOVE BIOGEL PI IND STRL 7.0 (GLOVE) ×1 IMPLANT
GLOVE BIOGEL PI INDICATOR 7.0 (GLOVE) ×1
GLOVE EXAM NITRILE MD LF STRL (GLOVE) ×2 IMPLANT
GLOVE SS BIOGEL STRL SZ 6 (GLOVE) ×1 IMPLANT
GLOVE SS BIOGEL STRL SZ 6.5 (GLOVE) ×1 IMPLANT
GLOVE SUPERSENSE BIOGEL SZ 6 (GLOVE) ×1
GLOVE SUPERSENSE BIOGEL SZ 6.5 (GLOVE) ×1
GLOVE SURG SS PI 7.5 STRL IVOR (GLOVE) ×2 IMPLANT
GOWN STRL REUS W/ TWL XL LVL3 (GOWN DISPOSABLE) ×1 IMPLANT
GOWN STRL REUS W/TWL LRG LVL3 (GOWN DISPOSABLE) ×2 IMPLANT
GOWN STRL REUS W/TWL XL LVL3 (GOWN DISPOSABLE) ×1
KIT ROOM TURNOVER APOR (KITS) ×2 IMPLANT
MANIFOLD NEPTUNE II (INSTRUMENTS) ×2 IMPLANT
NEEDLE HYPO 25X1 1.5 SAFETY (NEEDLE) ×2 IMPLANT
NS IRRIG 1000ML POUR BTL (IV SOLUTION) ×2 IMPLANT
PACK MINOR (CUSTOM PROCEDURE TRAY) ×2 IMPLANT
PAD ARMBOARD 7.5X6 YLW CONV (MISCELLANEOUS) ×2 IMPLANT
SET BASIN LINEN APH (SET/KITS/TRAYS/PACK) ×2 IMPLANT
SPONGE GAUZE 4X4 12PLY (GAUZE/BANDAGES/DRESSINGS) ×2 IMPLANT
SUT PROLENE 3 0 PS 2 (SUTURE) ×6 IMPLANT
SUT VIC AB 3-0 SH 27 (SUTURE)
SUT VIC AB 3-0 SH 27X BRD (SUTURE) IMPLANT
SUT VIC AB 4-0 PS2 27 (SUTURE) IMPLANT
SYR CONTROL 10ML LL (SYRINGE) ×2 IMPLANT
TAPE CLOTH SURG 4X10 WHT LF (GAUZE/BANDAGES/DRESSINGS) ×2 IMPLANT

## 2015-09-12 NOTE — Anesthesia Procedure Notes (Signed)
Procedure Name: LMA Insertion Date/Time: 09/12/2015 8:37 AM Performed by: Gershon Mussel, Eeshan Verbrugge Pre-anesthesia Checklist: Patient identified, Patient being monitored, Timeout performed, Emergency Drugs available and Suction available Patient Re-evaluated:Patient Re-evaluated prior to inductionOxygen Delivery Method: Circle System Utilized Preoxygenation: Pre-oxygenation with 100% oxygen Intubation Type: IV induction Ventilation: Mask ventilation without difficulty LMA Size: 5.0 Airway Equipment and Method: Stylet Placement Confirmation: ETT inserted through vocal cords under direct vision,  positive ETCO2 and breath sounds checked- equal and bilateral Tube secured with: Tape Dental Injury: Teeth and Oropharynx as per pre-operative assessment

## 2015-09-12 NOTE — Addendum Note (Signed)
Addendum  created 09/12/15 0957 by Michele Rockers, CRNA   Modules edited: Anesthesia Events

## 2015-09-12 NOTE — Anesthesia Preprocedure Evaluation (Signed)
Anesthesia Evaluation  Patient identified by MRN, date of birth, ID band Patient awake    Reviewed: Allergy & Precautions, NPO status , Patient's Chart, lab work & pertinent test results  Airway Mallampati: II  TM Distance: >3 FB     Dental  (+) Teeth Intact, Dental Advisory Given   Pulmonary shortness of breath, former smoker,    breath sounds clear to auscultation       Cardiovascular negative cardio ROS   Rhythm:Regular Rate:Normal     Neuro/Psych    GI/Hepatic negative GI ROS,   Endo/Other    Renal/GU      Musculoskeletal   Abdominal   Peds  Hematology   Anesthesia Other Findings   Reproductive/Obstetrics                             Anesthesia Physical Anesthesia Plan  ASA: II  Anesthesia Plan: General   Post-op Pain Management:    Induction: Intravenous  Airway Management Planned: LMA  Additional Equipment:   Intra-op Plan:   Post-operative Plan: Extubation in OR  Informed Consent: I have reviewed the patients History and Physical, chart, labs and discussed the procedure including the risks, benefits and alternatives for the proposed anesthesia with the patient or authorized representative who has indicated his/her understanding and acceptance.     Plan Discussed with:   Anesthesia Plan Comments:         Anesthesia Quick Evaluation

## 2015-09-12 NOTE — Addendum Note (Signed)
Addendum  created 09/12/15 1132 by Michele Rockers, CRNA   Modules edited: Anesthesia Events, Anesthesia Flowsheet, Anesthesia Medication Administration, Narrator   Narrator:  Narrator: Event Log Edited

## 2015-09-12 NOTE — Anesthesia Postprocedure Evaluation (Signed)
  Anesthesia Post-op Note  Patient: Travis Wood  Procedure(s) Performed: Procedure(s): EXCISION SOFT TISSUE NEOPLASM (6 CM), BACK (N/A)  Patient Location: PACU  Anesthesia Type:General  Level of Consciousness: awake, alert , oriented, patient cooperative and responds to stimulation  Airway and Oxygen Therapy: Patient Spontanous Breathing and Patient connected to face mask oxygen  Post-op Pain: none  Post-op Assessment: Post-op Vital signs reviewed, Patient's Cardiovascular Status Stable, Respiratory Function Stable, Patent Airway, No signs of Nausea or vomiting and Pain level controlled              Post-op Vital Signs: Reviewed and stable  Last Vitals:  Filed Vitals:   09/12/15 0930  BP: 111/58  Pulse: 51  Temp: 36.4 C  Resp: 14    Complications: No apparent anesthesia complications

## 2015-09-12 NOTE — Op Note (Signed)
Patient:  Travis Wood  DOB:  [birth year]  MRN:  [id number]   Preop Diagnosis:  Subcutaneous mass, back  Postop Diagnosis:  Same, sebaceous cyst  Procedure:  Excision of subcutaneous mass, back, 6 cm  Surgeon:  Aviva Signs, M.D.  Anes:  General endotracheal  Indications:  Patient is a 64 year old white male who presents with an enlarging mass along the lower portion of his back. The risks and benefits of the procedure including pain, infection, and recurrence were fully explained to the patient, who gave informed consent.  Procedure note:  The patient was placed in the left lithotomy position after induction of general endotracheal anesthesia. The lower back was prepped and draped using the usual sterile technique with DuraPrep. Surgical site confirmation was performed.  A longitudinal incision was made over the subcutaneous mass which was centrally located in the lower back. A sebaceous cyst was found. This cyst along with its wall was fully excised without difficulty. It was disposed of. The wound was irrigated normal saline. 0.5% Sensorcaine was instilled the surrounding wound. The subcutaneous layer was reapproximated using a 3-0 Vicryl interrupted suture. The skin was closed using a 3-0 Prolene vertical mattress suture. Betadine ointment and a dry sterile dressing were applied.  All tape and needle counts were correct at the end of the procedure. The patient was extubated in the operating room and transferred to PACU in stable condition.  Complications:  None  EBL:  Minimal  Specimen:  None

## 2015-09-12 NOTE — Discharge Instructions (Signed)
Sebaceous Cyst Removal, Care After Refer to this sheet in the next few weeks. These instructions provide you with information about caring for yourself after your procedure. Your health care provider may also give you more specific instructions. Your treatment has been planned according to current medical practices, but problems sometimes occur. Call your health care provider if you have any problems or questions after your procedure. WHAT TO EXPECT AFTER THE PROCEDURE After your procedure, it is common to have:  Soreness in the area where your cyst was removed.  Tightness or itching from your skin sutures. HOME CARE INSTRUCTIONS  Take medicines only as directed by your health care provider.  If you were prescribed an antibiotic medicine, finish all of it even if you start to feel better.  Use antibiotic ointment as directed by your health care provider. Follow the instructions carefully.  There are many different ways to close and cover an incision, including stitches (sutures), skin glue, and adhesive strips. Follow your health care provider's instructions about:  Incision care.  Bandage (dressing) changes and removal.  Incision closure removal.  Keep the bandage (dressing) dry until your health care provider says that it can be removed. Take sponge baths only. Ask your health care provider when you can start showering or taking a bath.  After your dressing is off, check your incision every day for signs of infection. Watch for:  Redness, swelling, or pain.  Fluid, blood, or pus.  You can return to your normal activities. Do not do anything that stretches or puts pressure on your incision.  You can return to your normal diet.  Keep all follow-up visits as directed by your health care provider. This is important. SEEK MEDICAL CARE IF:  You have a fever.  Your incision bleeds.  You have redness, swelling, or pain in the incision area.  You have fluid, blood, or pus coming  from your incision.  Your cyst comes back after surgery.   This information is not intended to replace advice given to you by your health care provider. Make sure you discuss any questions you have with your health care provider.   Document Released: 12/08/2014 Document Reviewed: 12/08/2014 Elsevier Interactive Patient Education [employer].

## 2015-09-12 NOTE — Interval H&P Note (Signed)
History and Physical Interval Note:  09/12/2015 8:14 AM  Travis Wood  has presented today for surgery, with the diagnosis of soft tissue neoplasm  The various methods of treatment have been discussed with the patient and family. After consideration of risks, benefits and other options for treatment, the patient has consented to  Procedure(s): EXCISION SOFT TISSUE NEOPLASM - 6 CM (N/A) as a surgical intervention .  The patient's history has been reviewed, patient examined, no change in status, stable for surgery.  I have reviewed the patient's chart and labs.  Questions were answered to the patient's satisfaction.     Aviva Signs A

## 2015-09-12 NOTE — Transfer of Care (Signed)
Immediate Anesthesia Transfer of Care Note  Patient: Travis Wood  Procedure(s) Performed: Procedure(s): EXCISION SOFT TISSUE NEOPLASM (6 CM), BACK (N/A)  Patient Location: PACU  Anesthesia Type:General  Level of Consciousness: awake, alert , oriented, patient cooperative and responds to stimulation  Airway & Oxygen Therapy: Patient Spontanous Breathing and Patient connected to face mask oxygen  Post-op Assessment: Report given to RN, Post -op Vital signs reviewed and stable and Patient moving all extremities X 4  Post vital signs: Reviewed and stable  Last Vitals:  Filed Vitals:   09/12/15 0930  BP: 111/58  Pulse: 51  Temp: 36.4 C  Resp: 14    Complications: No apparent anesthesia complications

## 2015-09-13 ENCOUNTER — Encounter (HOSPITAL_COMMUNITY): Payer: Self-pay | Admitting: General Surgery

## 2015-10-10 ENCOUNTER — Encounter (HOSPITAL_COMMUNITY)
Admission: RE | Admit: 2015-10-10 | Discharge: 2015-10-10 | Disposition: A | Discharge status: Home / Self Care | Payer: 59 | Source: Ambulatory Visit | Attending: Ophthalmology | Admitting: Ophthalmology

## 2015-10-12 MED ORDER — NEOMYCIN-POLYMYXIN-DEXAMETH 3.5-10000-0.1 OP SUSP
OPHTHALMIC | Status: AC
  Filled 2015-10-12: qty 5

## 2015-10-12 MED ORDER — CYCLOPENTOLATE-PHENYLEPHRINE OP SOLN OPTIME - NO CHARGE
OPHTHALMIC | Status: AC
Start: 2015-10-12 — End: 2015-10-12
  Filled 2015-10-12: qty 2

## 2015-10-12 MED ORDER — TETRACAINE HCL 0.5 % OP SOLN
OPHTHALMIC | Status: AC
  Filled 2015-10-12: qty 2

## 2015-10-12 MED ORDER — LIDOCAINE HCL (PF) 1 % IJ SOLN
INTRAMUSCULAR | Status: AC
  Filled 2015-10-12: qty 2

## 2015-10-12 MED ORDER — PHENYLEPHRINE HCL 2.5 % OP SOLN
OPHTHALMIC | Status: AC
Start: 2015-10-12 — End: 2015-10-12
  Filled 2015-10-12: qty 15

## 2015-10-12 MED ORDER — LIDOCAINE HCL 3.5 % OP GEL
OPHTHALMIC | Status: AC
  Filled 2015-10-12: qty 1

## 2015-10-15 ENCOUNTER — Encounter (HOSPITAL_COMMUNITY)
Admission: RE | Disposition: A | Discharge status: Home / Self Care | Payer: Self-pay | Source: Ambulatory Visit | Attending: Ophthalmology

## 2015-10-15 ENCOUNTER — Ambulatory Visit (HOSPITAL_COMMUNITY)
Admission: RE | Admit: 2015-10-15 | Discharge: 2015-10-15 | Disposition: A | Discharge status: Home / Self Care | Payer: 59 | Source: Ambulatory Visit | Attending: Ophthalmology | Admitting: Ophthalmology

## 2015-10-15 ENCOUNTER — Ambulatory Visit (HOSPITAL_COMMUNITY): Payer: 59 | Admitting: Anesthesiology

## 2015-10-15 ENCOUNTER — Encounter (HOSPITAL_COMMUNITY): Payer: Self-pay | Admitting: *Deleted

## 2015-10-15 DIAGNOSIS — Z79899 Other long term (current) drug therapy: Secondary | ICD-10-CM | POA: Insufficient documentation

## 2015-10-15 DIAGNOSIS — E7800 Pure hypercholesterolemia, unspecified: Secondary | ICD-10-CM | POA: Insufficient documentation

## 2015-10-15 DIAGNOSIS — H25812 Combined forms of age-related cataract, left eye: Secondary | ICD-10-CM | POA: Insufficient documentation

## 2015-10-15 DIAGNOSIS — Z87891 Personal history of nicotine dependence: Secondary | ICD-10-CM | POA: Diagnosis not present

## 2015-10-15 DIAGNOSIS — H52202 Unspecified astigmatism, left eye: Secondary | ICD-10-CM | POA: Insufficient documentation

## 2015-10-15 HISTORY — PX: CATARACT EXTRACTION W/PHACO: SHX586

## 2015-10-15 SURGERY — PHACOEMULSIFICATION, CATARACT, WITH IOL INSERTION
Anesthesia: Monitor Anesthesia Care | Site: Eye | Laterality: Left

## 2015-10-15 MED ORDER — BSS IO SOLN
INTRAOCULAR | Status: DC | PRN
  Administered 2015-10-15: 15 mL via INTRAOCULAR

## 2015-10-15 MED ORDER — LIDOCAINE HCL 3.5 % OP GEL: 1.0000 "application " | Freq: Once | OPHTHALMIC | Status: DC

## 2015-10-15 MED ORDER — TETRACAINE HCL 0.5 % OP SOLN
1.0000 [drp] | OPHTHALMIC | Status: AC
  Administered 2015-10-15 (×2): 1 [drp] via OPHTHALMIC

## 2015-10-15 MED ORDER — LACTATED RINGERS IV SOLN
INTRAVENOUS | Status: DC
  Administered 2015-10-15: 10:00:00 via INTRAVENOUS

## 2015-10-15 MED ORDER — MIDAZOLAM HCL 2 MG/2ML IJ SOLN
1.0000 mg | INTRAMUSCULAR | Status: DC | PRN
  Administered 2015-10-15: 2 mg via INTRAVENOUS

## 2015-10-15 MED ORDER — MIDAZOLAM HCL 2 MG/2ML IJ SOLN
INTRAMUSCULAR | Status: AC
  Filled 2015-10-15: qty 2

## 2015-10-15 MED ORDER — LIDOCAINE HCL (PF) 1 % IJ SOLN
INTRAMUSCULAR | Status: DC | PRN
  Administered 2015-10-15: .5 mL

## 2015-10-15 MED ORDER — LIDOCAINE 3.5 % OP GEL OPTIME - NO CHARGE
OPHTHALMIC | Status: DC | PRN
  Administered 2015-10-15: 2 [drp] via OPHTHALMIC

## 2015-10-15 MED ORDER — CYCLOPENTOLATE-PHENYLEPHRINE 0.2-1 % OP SOLN
1.0000 [drp] | OPHTHALMIC | Status: AC
  Administered 2015-10-15 (×2): 1 [drp] via OPHTHALMIC

## 2015-10-15 MED ORDER — PHENYLEPHRINE HCL 2.5 % OP SOLN
1.0000 [drp] | OPHTHALMIC | Status: AC
  Administered 2015-10-15 (×2): 1 [drp] via OPHTHALMIC

## 2015-10-15 MED ORDER — EPINEPHRINE HCL 1 MG/ML IJ SOLN
INTRAMUSCULAR | Status: AC
  Filled 2015-10-15: qty 1

## 2015-10-15 MED ORDER — PROVISC 10 MG/ML IO SOLN
INTRAOCULAR | Status: DC | PRN
  Administered 2015-10-15: 0.85 mL via INTRAOCULAR

## 2015-10-15 MED ORDER — POVIDONE-IODINE 5 % OP SOLN
OPHTHALMIC | Status: DC | PRN
  Administered 2015-10-15: 1 via OPHTHALMIC

## 2015-10-15 MED ORDER — EPINEPHRINE HCL 1 MG/ML IJ SOLN
INTRAOCULAR | Status: DC | PRN
  Administered 2015-10-15: 11:00:00

## 2015-10-15 SURGICAL SUPPLY — 33 items
CAPSULAR TENSION RING-AMO (OPHTHALMIC RELATED) IMPLANT
CLOTH BEACON ORANGE TIMEOUT ST (SAFETY) ×2 IMPLANT
EYE SHIELD UNIVERSAL CLEAR (GAUZE/BANDAGES/DRESSINGS) ×2 IMPLANT
GLOVE BIO SURGEON STRL SZ 6.5 (GLOVE) IMPLANT
GLOVE BIOGEL PI IND STRL 6.5 (GLOVE) IMPLANT
GLOVE BIOGEL PI IND STRL 7.0 (GLOVE) ×3 IMPLANT
GLOVE BIOGEL PI IND STRL 7.5 (GLOVE) IMPLANT
GLOVE BIOGEL PI INDICATOR 6.5 (GLOVE)
GLOVE BIOGEL PI INDICATOR 7.0 (GLOVE) ×3
GLOVE BIOGEL PI INDICATOR 7.5 (GLOVE)
GLOVE ECLIPSE 6.5 STRL STRAW (GLOVE) IMPLANT
GLOVE ECLIPSE 7.0 STRL STRAW (GLOVE) IMPLANT
GLOVE ECLIPSE 7.5 STRL STRAW (GLOVE) IMPLANT
GLOVE EXAM NITRILE LRG STRL (GLOVE) IMPLANT
GLOVE EXAM NITRILE MD LF STRL (GLOVE) IMPLANT
GLOVE SKINSENSE NS SZ6.5 (GLOVE)
GLOVE SKINSENSE NS SZ7.0 (GLOVE)
GLOVE SKINSENSE STRL SZ6.5 (GLOVE) IMPLANT
GLOVE SKINSENSE STRL SZ7.0 (GLOVE) IMPLANT
KIT VITRECTOMY (OPHTHALMIC RELATED) IMPLANT
LENS IOL ACRYSOF IQ TORIC 15.0 ×2 IMPLANT
PAD ARMBOARD 7.5X6 YLW CONV (MISCELLANEOUS) ×2 IMPLANT
PROC W NO LENS (INTRAOCULAR LENS)
PROC W SPEC LENS (INTRAOCULAR LENS) ×2
PROCESS W NO LENS (INTRAOCULAR LENS) IMPLANT
PROCESS W SPEC LENS (INTRAOCULAR LENS) ×1 IMPLANT
RETRACTOR IRIS SIGHTPATH (OPHTHALMIC RELATED) IMPLANT
RING MALYGIN (MISCELLANEOUS) IMPLANT
SYRINGE LUER LOK 1CC (MISCELLANEOUS) ×2 IMPLANT
TAPE SURG TRANSPARENT 2IN (GAUZE/BANDAGES/DRESSINGS) ×1 IMPLANT
TAPE TRANSPARENT 2IN (GAUZE/BANDAGES/DRESSINGS) ×1
VISCOELASTIC ADDITIONAL (OPHTHALMIC RELATED) IMPLANT
WATER STERILE IRR 250ML POUR (IV SOLUTION) ×2 IMPLANT

## 2015-10-15 NOTE — H&P (Signed)
I have reviewed the H&P, the patient was re-examined, and I have identified no interval changes in medical condition and plan of care since the history and physical of record  

## 2015-10-15 NOTE — Discharge Instructions (Signed)

## 2015-10-15 NOTE — Anesthesia Postprocedure Evaluation (Signed)
  Anesthesia Post-op Note  Patient: Travis Wood  Procedure(s) Performed: Procedure(s) with comments: CATARACT EXTRACTION PHACO AND INTRAOCULAR LENS PLACEMENT LEFT EYE (Left) - CDE:12.20  Patient Location: Short Stay  Anesthesia Type:MAC  Level of Consciousness: awake, alert , oriented and patient cooperative  Airway and Oxygen Therapy: Patient Spontanous Breathing  Post-op Pain: none  Post-op Assessment: Post-op Vital signs reviewed, Patient's Cardiovascular Status Stable, Respiratory Function Stable, Patent Airway and No signs of Nausea or vomiting              Post-op Vital Signs: Reviewed and stable  Last Vitals:  Filed Vitals:   10/15/15 1015  BP: 103/69  Temp:   Resp: 16    Complications: No apparent anesthesia complications

## 2015-10-15 NOTE — Transfer of Care (Signed)
Immediate Anesthesia Transfer of Care Note  Patient: Travis Wood  Procedure(s) Performed: Procedure(s) with comments: CATARACT EXTRACTION PHACO AND INTRAOCULAR LENS PLACEMENT LEFT EYE (Left) - CDE:12.20  Patient Location: Short Stay  Anesthesia Type:MAC  Level of Consciousness: awake, alert , oriented and patient cooperative  Airway & Oxygen Therapy: Patient Spontanous Breathing  Post-op Assessment: Report given to RN, Post -op Vital signs reviewed and stable and Patient moving all extremities  Post vital signs: Reviewed and stable  Last Vitals:  Filed Vitals:   10/15/15 1015  BP: 103/69  Temp:   Resp: 16    Complications: No apparent anesthesia complications

## 2015-10-15 NOTE — Op Note (Signed)
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  Assistants: None  Anesthesia: Topical with MAC  Indications: Painless, progressive loss of vision with compromise of daily activities.  Surgery: Cataract Extraction with Intraocular lens Implant Left Eye  Discription: The patient had dilating drops and viscous lidocaine placed into the Left in the pre-op holding area. In the sitting position horizontal reference marks were made on the cornea.  After transfer to the operating room, a time out was performed. The patient was then prepped and draped. Beginning with a 21 degree blade a paracentesis port was made at the surgeon's 2 o'clock position. The anterior chamber was then filled with 1% non-preserved lidocaine. This was followed by filling the anterior chamber with Provisc. A 2.105mm keratome blade was used to make a clear cornea incision at the temporal limbus. A bent cystatome needle was used to create a continuous tear capsulotomy. Hydrodissection was performed with balanced salt solution on a Fine canula. The lens nucleus was then removed using the phacoemulsification handpiece. Residual cortex was removed with the I&A handpiece. The anterior chamber and capsular bag were refilled with Provisc. A posterior chamber intraocular lens was placed into the capsular bag with it's injector. Additional corneal marks were made on the 81/261 degree meridians.  The Provisc was then removed from the anterior chamber and capsular bag with the I&A handpiece. The implant was positioned with the Kuglan hook. Stromal hydration of the main incision and paracentesis port was performed with BSS on a Fine canula. The wounds were tested for leak which was negative. The patient tolerated the procedure well. There were no operative complications. The  patient was then transferred to the recovery room in stable condition.  Complications: None  Specimen: None  EBL: None  Prosthetic device: [employer] [id number], power15.5,  SN [id number].

## 2015-10-15 NOTE — Anesthesia Preprocedure Evaluation (Signed)
Anesthesia Evaluation  Patient identified by MRN, date of birth, ID band Patient awake    Reviewed: Allergy & Precautions, NPO status , Patient's Chart, lab work & pertinent test results  Airway Mallampati: II  TM Distance: >3 FB     Dental  (+) Teeth Intact,    Pulmonary former smoker,    Pulmonary exam normal        Cardiovascular Normal cardiovascular exam     Neuro/Psych    GI/Hepatic   Endo/Other    Renal/GU      Musculoskeletal   Abdominal Normal abdominal exam  (+)   Peds  Hematology   Anesthesia Other Findings   Reproductive/Obstetrics                             Anesthesia Physical Anesthesia Plan  ASA: II  Anesthesia Plan: MAC   Post-op Pain Management:    Induction: Intravenous  Airway Management Planned: Nasal Cannula  Additional Equipment:   Intra-op Plan:   Post-operative Plan:   Informed Consent: I have reviewed the patients History and Physical, chart, labs and discussed the procedure including the risks, benefits and alternatives for the proposed anesthesia with the patient or authorized representative who has indicated his/her understanding and acceptance.   Dental advisory given  Plan Discussed with:   Anesthesia Plan Comments:         Anesthesia Quick Evaluation

## 2015-10-16 ENCOUNTER — Encounter (HOSPITAL_COMMUNITY): Payer: Self-pay | Admitting: Ophthalmology

## 2015-10-23 ENCOUNTER — Inpatient Hospital Stay (HOSPITAL_COMMUNITY): Admission: RE | Admit: 2015-10-23 | Payer: Managed Care, Other (non HMO) | Source: Ambulatory Visit

## 2015-11-08 ENCOUNTER — Encounter (HOSPITAL_COMMUNITY)
Admission: RE | Admit: 2015-11-08 | Discharge: 2015-11-08 | Disposition: A | Discharge status: Home / Self Care | Payer: 59 | Source: Ambulatory Visit | Attending: Ophthalmology | Admitting: Ophthalmology

## 2015-11-09 MED ORDER — TETRACAINE HCL 0.5 % OP SOLN
OPHTHALMIC | Status: AC
  Filled 2015-11-09: qty 4

## 2015-11-09 MED ORDER — LIDOCAINE HCL (PF) 1 % IJ SOLN
INTRAMUSCULAR | Status: AC
  Filled 2015-11-09: qty 2

## 2015-11-09 MED ORDER — LIDOCAINE HCL 3.5 % OP GEL
OPHTHALMIC | Status: AC
  Filled 2015-11-09: qty 1

## 2015-11-09 MED ORDER — CYCLOPENTOLATE-PHENYLEPHRINE OP SOLN OPTIME - NO CHARGE
OPHTHALMIC | Status: AC
  Filled 2015-11-09: qty 2

## 2015-11-09 MED ORDER — PHENYLEPHRINE HCL 2.5 % OP SOLN
OPHTHALMIC | Status: AC
  Filled 2015-11-09: qty 15

## 2015-11-09 MED ORDER — NEOMYCIN-POLYMYXIN-DEXAMETH 3.5-10000-0.1 OP SUSP
OPHTHALMIC | Status: AC
  Filled 2015-11-09: qty 5

## 2015-11-12 ENCOUNTER — Encounter (HOSPITAL_COMMUNITY): Payer: Self-pay | Admitting: *Deleted

## 2015-11-12 ENCOUNTER — Ambulatory Visit (HOSPITAL_COMMUNITY): Payer: 59 | Admitting: Anesthesiology

## 2015-11-12 ENCOUNTER — Ambulatory Visit (HOSPITAL_COMMUNITY)
Admission: RE | Admit: 2015-11-12 | Discharge: 2015-11-12 | Disposition: A | Discharge status: Home / Self Care | Payer: 59 | Source: Ambulatory Visit | Attending: Ophthalmology | Admitting: Ophthalmology

## 2015-11-12 ENCOUNTER — Encounter (HOSPITAL_COMMUNITY)
Admission: RE | Disposition: A | Discharge status: Home / Self Care | Payer: Self-pay | Source: Ambulatory Visit | Attending: Ophthalmology

## 2015-11-12 DIAGNOSIS — H25811 Combined forms of age-related cataract, right eye: Secondary | ICD-10-CM | POA: Diagnosis not present

## 2015-11-12 DIAGNOSIS — E785 Hyperlipidemia, unspecified: Secondary | ICD-10-CM | POA: Diagnosis not present

## 2015-11-12 DIAGNOSIS — Z79899 Other long term (current) drug therapy: Secondary | ICD-10-CM | POA: Diagnosis not present

## 2015-11-12 DIAGNOSIS — H52201 Unspecified astigmatism, right eye: Secondary | ICD-10-CM | POA: Insufficient documentation

## 2015-11-12 HISTORY — PX: CATARACT EXTRACTION W/PHACO: SHX586

## 2015-11-12 SURGERY — PHACOEMULSIFICATION, CATARACT, WITH IOL INSERTION
Anesthesia: Monitor Anesthesia Care | Site: Eye | Laterality: Right

## 2015-11-12 MED ORDER — FENTANYL CITRATE (PF) 100 MCG/2ML IJ SOLN
25.0000 ug | INTRAMUSCULAR | Status: AC
  Administered 2015-11-12 (×2): 25 ug via INTRAVENOUS

## 2015-11-12 MED ORDER — LACTATED RINGERS IV SOLN
INTRAVENOUS | Status: DC
  Administered 2015-11-12: 1000 mL via INTRAVENOUS

## 2015-11-12 MED ORDER — NEOMYCIN-POLYMYXIN-DEXAMETH 3.5-10000-0.1 OP SUSP
OPHTHALMIC | Status: DC | PRN
  Administered 2015-11-12: 1 [drp] via OPHTHALMIC

## 2015-11-12 MED ORDER — LIDOCAINE HCL (PF) 1 % IJ SOLN
INTRAMUSCULAR | Status: DC | PRN
  Administered 2015-11-12: .6 mL

## 2015-11-12 MED ORDER — BSS IO SOLN
INTRAOCULAR | Status: DC | PRN
  Administered 2015-11-12: 15 mL via INTRAOCULAR

## 2015-11-12 MED ORDER — FENTANYL CITRATE (PF) 100 MCG/2ML IJ SOLN
INTRAMUSCULAR | Status: AC
  Filled 2015-11-12: qty 2

## 2015-11-12 MED ORDER — TETRACAINE HCL 0.5 % OP SOLN
1.0000 [drp] | OPHTHALMIC | Status: AC
  Administered 2015-11-12 (×3): 1 [drp] via OPHTHALMIC

## 2015-11-12 MED ORDER — CYCLOPENTOLATE-PHENYLEPHRINE 0.2-1 % OP SOLN
1.0000 [drp] | OPHTHALMIC | Status: AC
  Administered 2015-11-12 (×3): 1 [drp] via OPHTHALMIC

## 2015-11-12 MED ORDER — MIDAZOLAM HCL 2 MG/2ML IJ SOLN
INTRAMUSCULAR | Status: AC
  Filled 2015-11-12: qty 2

## 2015-11-12 MED ORDER — EPINEPHRINE HCL 1 MG/ML IJ SOLN
INTRAOCULAR | Status: DC | PRN
  Administered 2015-11-12: 500 mL

## 2015-11-12 MED ORDER — LIDOCAINE HCL 3.5 % OP GEL
1.0000 "application " | Freq: Once | OPHTHALMIC | Status: AC
  Administered 2015-11-12: 1 via OPHTHALMIC

## 2015-11-12 MED ORDER — POVIDONE-IODINE 5 % OP SOLN
OPHTHALMIC | Status: DC | PRN
  Administered 2015-11-12: 1 via OPHTHALMIC

## 2015-11-12 MED ORDER — EPINEPHRINE HCL 1 MG/ML IJ SOLN
INTRAMUSCULAR | Status: AC
  Filled 2015-11-12: qty 1

## 2015-11-12 MED ORDER — PROVISC 10 MG/ML IO SOLN
INTRAOCULAR | Status: DC | PRN
  Administered 2015-11-12: 0.85 mL via INTRAOCULAR

## 2015-11-12 MED ORDER — PHENYLEPHRINE HCL 2.5 % OP SOLN
1.0000 [drp] | OPHTHALMIC | Status: AC
  Administered 2015-11-12 (×3): 1 [drp] via OPHTHALMIC

## 2015-11-12 MED ORDER — MIDAZOLAM HCL 2 MG/2ML IJ SOLN
1.0000 mg | INTRAMUSCULAR | Status: DC | PRN
  Administered 2015-11-12: 2 mg via INTRAVENOUS

## 2015-11-12 SURGICAL SUPPLY — 33 items
CAPSULAR TENSION RING-AMO (OPHTHALMIC RELATED) IMPLANT
CLOTH BEACON ORANGE TIMEOUT ST (SAFETY) ×2 IMPLANT
EYE SHIELD UNIVERSAL CLEAR (GAUZE/BANDAGES/DRESSINGS) ×2 IMPLANT
GLOVE BIO SURGEON STRL SZ 6.5 (GLOVE) IMPLANT
GLOVE BIOGEL PI IND STRL 6.5 (GLOVE) IMPLANT
GLOVE BIOGEL PI IND STRL 7.0 (GLOVE) ×2 IMPLANT
GLOVE BIOGEL PI IND STRL 7.5 (GLOVE) IMPLANT
GLOVE BIOGEL PI INDICATOR 6.5 (GLOVE)
GLOVE BIOGEL PI INDICATOR 7.0 (GLOVE) ×2
GLOVE BIOGEL PI INDICATOR 7.5 (GLOVE)
GLOVE ECLIPSE 6.5 STRL STRAW (GLOVE) IMPLANT
GLOVE ECLIPSE 7.0 STRL STRAW (GLOVE) IMPLANT
GLOVE ECLIPSE 7.5 STRL STRAW (GLOVE) IMPLANT
GLOVE EXAM NITRILE LRG STRL (GLOVE) IMPLANT
GLOVE EXAM NITRILE MD LF STRL (GLOVE) IMPLANT
GLOVE SKINSENSE NS SZ6.5 (GLOVE)
GLOVE SKINSENSE NS SZ7.0 (GLOVE)
GLOVE SKINSENSE STRL SZ6.5 (GLOVE) IMPLANT
GLOVE SKINSENSE STRL SZ7.0 (GLOVE) IMPLANT
KIT VITRECTOMY (OPHTHALMIC RELATED) IMPLANT
LENS IOL ACRYSOF IQ TORIC 15.0 ×2 IMPLANT
PAD ARMBOARD 7.5X6 YLW CONV (MISCELLANEOUS) ×2 IMPLANT
PROC W NO LENS (INTRAOCULAR LENS)
PROC W SPEC LENS (INTRAOCULAR LENS) ×2
PROCESS W NO LENS (INTRAOCULAR LENS) IMPLANT
PROCESS W SPEC LENS (INTRAOCULAR LENS) ×1 IMPLANT
RETRACTOR IRIS SIGHTPATH (OPHTHALMIC RELATED) IMPLANT
RING MALYGIN (MISCELLANEOUS) IMPLANT
SYRINGE LUER LOK 1CC (MISCELLANEOUS) ×2 IMPLANT
TAPE SURG TRANSPORE 1 IN (GAUZE/BANDAGES/DRESSINGS) ×1 IMPLANT
TAPE SURGICAL TRANSPORE 1 IN (GAUZE/BANDAGES/DRESSINGS) ×1
VISCOELASTIC ADDITIONAL (OPHTHALMIC RELATED) IMPLANT
WATER STERILE IRR 250ML POUR (IV SOLUTION) ×2 IMPLANT

## 2015-11-12 NOTE — Anesthesia Preprocedure Evaluation (Signed)
Anesthesia Evaluation  Patient identified by MRN, date of birth, ID band Patient awake    Reviewed: Allergy & Precautions, NPO status , Patient's Chart, lab work & pertinent test results  Airway Mallampati: II  TM Distance: >3 FB     Dental  (+) Teeth Intact,    Pulmonary former smoker,    Pulmonary exam normal        Cardiovascular negative cardio ROS Normal cardiovascular exam     Neuro/Psych    GI/Hepatic negative GI ROS,   Endo/Other    Renal/GU      Musculoskeletal   Abdominal Normal abdominal exam  (+)   Peds  Hematology   Anesthesia Other Findings   Reproductive/Obstetrics                             Anesthesia Physical Anesthesia Plan  ASA: II  Anesthesia Plan: MAC   Post-op Pain Management:    Induction: Intravenous  Airway Management Planned: Nasal Cannula  Additional Equipment:   Intra-op Plan:   Post-operative Plan:   Informed Consent: I have reviewed the patients History and Physical, chart, labs and discussed the procedure including the risks, benefits and alternatives for the proposed anesthesia with the patient or authorized representative who has indicated his/her understanding and acceptance.   Dental advisory given  Plan Discussed with:   Anesthesia Plan Comments:         Anesthesia Quick Evaluation

## 2015-11-12 NOTE — Op Note (Signed)
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  Assistants: None  Anesthesia: Topical with MAC  Indications: Painless, progressive loss of vision with compromise of daily activities.  Surgery: Cataract Extraction with Intraocular lens Implant Right Eye  Discription: The patient had dilating drops and viscous lidocaine placed into the Right in the pre-op holding area. In the sitting position horizontal reference marks were made on the cornea.  After transfer to the operating room, a time out was performed. The patient was then prepped and draped. Beginning with a 36 degree blade a paracentesis port was made at the surgeon's 2 o'clock position. The anterior chamber was then filled with 1% non-preserved lidocaine. This was followed by filling the anterior chamber with Provisc. A 2.42mm keratome blade was used to make a clear cornea incision at the temporal limbus. A bent cystatome needle was used to create a continuous tear capsulotomy. Hydrodissection was performed with balanced salt solution on a Fine canula. The lens nucleus was then removed using the phacoemulsification handpiece. Residual cortex was removed with the I&A handpiece. The anterior chamber and capsular bag were refilled with Provisc. A posterior chamber intraocular lens was placed into the capsular bag with it's injector. Additional corneal marks were made on the 77/257 degree meridians.  The Provisc was then removed from the anterior chamber and capsular bag with the I&A handpiece. The implant was positioned with the Kuglan hook. Stromal hydration of the main incision and paracentesis port was performed with BSS on a Fine canula. The wounds were tested for leak which was negative. The patient tolerated the procedure well. There were no operative complications. The  patient was then transferred to the recovery room in stable condition.  Complications: None  Specimen: None  EBL: None  Prosthetic device: Abbott Technis Toric ZCT300, power 16.0,  SN [id number].

## 2015-11-12 NOTE — H&P (Signed)
I have reviewed the H&P, the patient was re-examined, and I have identified no interval changes in medical condition and plan of care since the history and physical of record  

## 2015-11-12 NOTE — Transfer of Care (Signed)
Immediate Anesthesia Transfer of Care Note  Patient: Travis Wood  Procedure(s) Performed: Procedure(s): CATARACT EXTRACTION PHACO AND INTRAOCULAR LENS PLACEMENT RIGHT EYE:  CDE:  8.99 (Right)  Patient Location: SICU  Anesthesia Type:MAC  Level of Consciousness: awake, alert , oriented and patient cooperative  Airway & Oxygen Therapy: Patient Spontanous Breathing  Post-op Assessment: Report given to RN, Post -op Vital signs reviewed and stable and Patient moving all extremities  Post vital signs: Reviewed and stable  Last Vitals:  Filed Vitals:   11/12/15 0750 11/12/15 0755  BP: 100/64 104/54  Pulse:    Temp:    Resp: 11 12    Complications: No apparent anesthesia complications

## 2015-11-12 NOTE — Discharge Instructions (Signed)

## 2015-11-12 NOTE — Anesthesia Postprocedure Evaluation (Signed)
Anesthesia Post Note  Patient: Travis Wood  Procedure(s) Performed: Procedure(s) (LRB): CATARACT EXTRACTION PHACO AND INTRAOCULAR LENS PLACEMENT RIGHT EYE:  CDE:  8.99 (Right)  Patient location during evaluation: Short Stay Anesthesia Type: MAC Level of consciousness: awake and alert and patient cooperative Pain management: pain level controlled Respiratory status: spontaneous breathing Cardiovascular status: bradycardic Anesthetic complications: no    Last Vitals:  Filed Vitals:   11/12/15 0750 11/12/15 0755  BP: 100/64 104/54  Pulse:    Temp:    Resp: 11 12    Last Pain: There were no vitals filed for this visit.               Briaunna Grindstaff J

## 2015-11-13 ENCOUNTER — Encounter (HOSPITAL_COMMUNITY): Payer: Self-pay | Admitting: Ophthalmology

## 2015-12-14 ENCOUNTER — Encounter: Payer: Self-pay | Admitting: Family Medicine

## 2015-12-14 ENCOUNTER — Ambulatory Visit (INDEPENDENT_AMBULATORY_CARE_PROVIDER_SITE_OTHER): Payer: BLUE CROSS/BLUE SHIELD | Admitting: Family Medicine

## 2015-12-14 VITALS — BP 128/78 | HR 64 | Temp 97.4°F | Ht 74.0 in | Wt 229.8 lb

## 2015-12-14 DIAGNOSIS — H6980 Other specified disorders of Eustachian tube, unspecified ear: Secondary | ICD-10-CM | POA: Insufficient documentation

## 2015-12-14 DIAGNOSIS — H6993 Unspecified Eustachian tube disorder, bilateral: Secondary | ICD-10-CM

## 2015-12-14 DIAGNOSIS — H6990 Unspecified Eustachian tube disorder, unspecified ear: Secondary | ICD-10-CM | POA: Insufficient documentation

## 2015-12-14 DIAGNOSIS — J218 Acute bronchiolitis due to other specified organisms: Secondary | ICD-10-CM

## 2015-12-14 DIAGNOSIS — E785 Hyperlipidemia, unspecified: Secondary | ICD-10-CM

## 2015-12-14 DIAGNOSIS — H6983 Other specified disorders of Eustachian tube, bilateral: Secondary | ICD-10-CM

## 2015-12-14 MED ORDER — AMOXICILLIN-POT CLAVULANATE 875-125 MG PO TABS: 1.0000 | ORAL_TABLET | Freq: Two times a day (BID) | ORAL | Status: DC

## 2015-12-14 MED ORDER — TRIAMCINOLONE ACETONIDE 55 MCG/ACT NA AERO: 2.0000 | INHALATION_SPRAY | Freq: Every day | NASAL | Status: DC

## 2015-12-14 NOTE — Progress Notes (Signed)
Subjective:  Patient ID: Travis Wood, male    DOB: 1951/04/16  Age: 65 y.o. MRN: [id number]  CC: Hyperlipidemia and Vit D deficiency   HPI Travis Wood presents for follow-up of elevated cholesterol. Doing well without complaints on current medication. Denies side effects of statin including myalgia and arthralgia and nausea. Also in today for liver function testing. Currently no chest pain, shortness of breath or other cardiovascular related symptoms noted. Patient presents with upper respiratory congestion. Rhinorrhea that is frequently purulent. There is moderate sore throat. Patient reports coughing frequently as well, with purulent sputum noted. There is no fever no chills no sweats. The patient denies being short of breath. Onset was 3-5 days ago. Gradually worsening in spite of home remedies. Some crackling in  ears for 6 mos.  History Travis Wood has a past medical history of History of colon polyps; Osteoarthritis; Hyperlipemia; Erectile dysfunction; Shortness of breath dyspnea; and History of bronchitis.   He has past surgical history that includes Appendectomy; Knee surgery; Mass excision (N/A, 09/12/2015); Cataract extraction w/PHACO (Left, 10/15/2015); and Cataract extraction w/PHACO (Right, 11/12/2015).   His family history includes Cancer in his sister; Colon cancer in his father; Colon polyps in his father; Heart disease in his father and mother.He reports that he quit smoking about 24 years ago. His smoking use included Cigarettes. He has a 5 pack-year smoking history. He has never used smokeless tobacco. He reports that he drinks about 0.6 oz of alcohol per week. He reports that he does not use illicit drugs.  Current Outpatient Prescriptions on File Prior to Visit  Medication Sig Dispense Refill  . atorvastatin (LIPITOR) 40 MG tablet Take 1 tablet (40 mg total) by mouth daily. For cholesterol 90 tablet 3  . Cholecalciferol (VITAMIN D) 2000 UNITS tablet Take 1 tablet (2,000  Units total) by mouth daily. 30 tablet 11  . Multiple Vitamin tablet Take 1 tablet by mouth daily.     Current Facility-Administered Medications on File Prior to Visit  Medication Dose Route Frequency Provider Last Rate Last Dose  . pneumococcal 23 valent vaccine (PNU-IMMUNE) injection 0.5 mL  0.5 mL Intramuscular Once Claretta Fraise, MD        ROS Review of Systems  Constitutional: Negative for fever, chills, diaphoresis, activity change, appetite change and unexpected weight change.  HENT: Positive for congestion, postnasal drip, rhinorrhea and sinus pressure. Negative for ear discharge, ear pain, hearing loss, nosebleeds, sneezing, sore throat and trouble swallowing.   Eyes: Negative for visual disturbance.  Respiratory: Negative for cough, chest tightness and shortness of breath.   Cardiovascular: Negative for chest pain and palpitations.  Gastrointestinal: Negative for abdominal pain, diarrhea and constipation.  Genitourinary: Negative for dysuria and flank pain.  Musculoskeletal: Negative for joint swelling and arthralgias.  Skin: Negative for rash.  Neurological: Negative for dizziness and headaches.  Psychiatric/Behavioral: Negative for sleep disturbance and dysphoric mood.    Objective:  BP 128/78 mmHg  Pulse 64  Temp(Src) 97.4 F (36.3 C) (Oral)  Ht '6\' 2"'  (1.88 m)  Wt 229 lb 12.8 oz (104.237 kg)  BMI 29.49 kg/m2  SpO2 97%  BP Readings from Last 3 Encounters:  12/14/15 128/78  11/12/15 112/70  10/15/15 118/76    Wt Readings from Last 3 Encounters:  12/14/15 229 lb 12.8 oz (104.237 kg)  11/12/15 224 lb (101.606 kg)  09/12/15 221 lb (100.245 kg)     Physical Exam  Constitutional: He is oriented to person, place, and time. He appears well-developed  and well-nourished. No distress.  HENT:  Head: Normocephalic and atraumatic.  Right Ear: External ear normal.  Left Ear: External ear normal.  Nose: Nose normal.  Mouth/Throat: Oropharynx is clear and moist.    Eyes: Conjunctivae and EOM are normal. Pupils are equal, round, and reactive to light.  Neck: Normal range of motion. Neck supple. No thyromegaly present.  Cardiovascular: Normal rate, regular rhythm and normal heart sounds.   No murmur heard. Pulmonary/Chest: Effort normal and breath sounds normal. No respiratory distress. He has no wheezes. He has no rales.  Abdominal: Soft. Bowel sounds are normal. He exhibits no distension. There is no tenderness.  Lymphadenopathy:    He has no cervical adenopathy.  Neurological: He is alert and oriented to person, place, and time. He has normal reflexes.  Skin: Skin is warm and dry.  Psychiatric: He has a normal mood and affect. His behavior is normal. Judgment and thought content normal.    No results found for: HGBA1C  Lab Results  Component Value Date   WBC 7.9 09/10/2015   HGB 14.4 09/10/2015   HCT 42.9 09/10/2015   PLT 175 09/10/2015   GLUCOSE 103* 12/14/2015   CHOL 145 12/14/2015   TRIG 39 12/14/2015   HDL 46 12/14/2015   LDLCALC 91 12/14/2015   ALT 20 12/14/2015   AST 15 12/14/2015   NA 143 12/14/2015   K 4.5 12/14/2015   CL 105 12/14/2015   CREATININE 0.87 12/14/2015   BUN 12 12/14/2015   CO2 25 12/14/2015   TSH 1.750 05/30/2015   PSA 1.8 02/20/2015    No results found.  Assessment & Plan:   Notnamed was seen today for hyperlipidemia and vit d deficiency.  Diagnoses and all orders for this visit:  Acute bronchiolitis due to other specified organisms -     CMP14+EGFR  Eustachian tube dysfunction, bilateral  Hyperlipidemia -     CMP14+EGFR -     Lipid panel  Other orders -     amoxicillin-clavulanate (AUGMENTIN) 875-125 MG tablet; Take 1 tablet by mouth 2 (two) times daily. -     triamcinolone (NASACORT AQ) 55 MCG/ACT AERO nasal inhaler; Place 2 sprays into the nose daily. On each side  I am having Travis Wood start on amoxicillin-clavulanate and triamcinolone. I am also having him maintain his Vitamin D,  atorvastatin, and Multiple Vitamin. We will continue to administer pneumococcal 23 valent vaccine.  Meds ordered this encounter  Medications  . amoxicillin-clavulanate (AUGMENTIN) 875-125 MG tablet    Sig: Take 1 tablet by mouth 2 (two) times daily.    Dispense:  20 tablet    Refill:  0  . triamcinolone (NASACORT AQ) 55 MCG/ACT AERO nasal inhaler    Sig: Place 2 sprays into the nose daily. On each side    Dispense:  1 Inhaler    Refill:  12     Follow-up: No Follow-up on file.  Claretta Fraise, M.D.

## 2015-12-15 LAB — LIPID PANEL
CHOLESTEROL TOTAL: 145 mg/dL (ref 100–199)
Chol/HDL Ratio: 3.2 ratio units (ref 0.0–5.0)
HDL: 46 mg/dL (ref >39)
LDL CALC: 91 mg/dL (ref 0–99)
Triglycerides: 39 mg/dL (ref 0–149)
VLDL CHOLESTEROL CAL: 8 mg/dL (ref 5–40)

## 2015-12-15 LAB — CMP14+EGFR
ALBUMIN: 4.1 g/dL (ref 3.6–4.8)
ALK PHOS: 104 IU/L (ref 39–117)
ALT: 20 IU/L (ref 0–44)
AST: 15 IU/L (ref 0–40)
Albumin/Globulin Ratio: 1.8 (ref 1.1–2.5)
BILIRUBIN TOTAL: 0.4 mg/dL (ref 0.0–1.2)
BUN / CREAT RATIO: 14 (ref 10–22)
BUN: 12 mg/dL (ref 8–27)
CHLORIDE: 105 mmol/L (ref 96–106)
CO2: 25 mmol/L (ref 18–29)
CREATININE: 0.87 mg/dL (ref 0.76–1.27)
Calcium: 9 mg/dL (ref 8.6–10.2)
GFR calc Af Amer: 105 mL/min/{1.73_m2} (ref >59)
GFR calc non Af Amer: 91 mL/min/{1.73_m2} (ref >59)
GLUCOSE: 103 mg/dL — AB (ref 65–99)
Globulin, Total: 2.3 g/dL (ref 1.5–4.5)
Potassium: 4.5 mmol/L (ref 3.5–5.2)
Sodium: 143 mmol/L (ref 134–144)
TOTAL PROTEIN: 6.4 g/dL (ref 6.0–8.5)

## 2015-12-17 ENCOUNTER — Encounter: Payer: Self-pay | Admitting: *Deleted

## 2016-03-20 NOTE — Therapy (Signed)
[facility] [address] [place], [place], [address] Phone: [phone]   Fax:  [phone]  Physical Therapy Treatment  Patient Details  Name: Travis Wood MRN: [id number] Date of Birth: 11/06/1951 No Data Recorded  Encounter Date: 03/21/2015    Past Medical History  Diagnosis Date  . History of colon polyps   . Osteoarthritis   . Hyperlipemia   . Erectile dysfunction   . Shortness of breath dyspnea   . History of bronchitis     Past Surgical History  Procedure Laterality Date  . Appendectomy    . Knee surgery      Left x 2   . Mass excision N/A 09/12/2015    Procedure: EXCISION SOFT TISSUE NEOPLASM (6 CM), BACK;  Surgeon: Mark Jenkins, MD;  Location: [facility];  Service: General;  Laterality: N/A;  . Cataract extraction w/phaco Left 10/15/2015    Procedure: CATARACT EXTRACTION PHACO AND INTRAOCULAR LENS PLACEMENT LEFT EYE;  Surgeon: Kerry Hunt, MD;  Location: [facility];  Service: Ophthalmology;  Laterality: Left;  CDE:12.20  . Cataract extraction w/phaco Right 11/12/2015    Procedure: CATARACT EXTRACTION PHACO AND INTRAOCULAR LENS PLACEMENT RIGHT EYE:  CDE:  8.99;  Surgeon: Kerry Hunt, MD;  Location: [facility];  Service: Ophthalmology;  Laterality: Right;    There were no vitals filed for this visit.                                    PT Long Term Goals - 03/21/15 1147    PT LONG TERM GOAL #1   Title Ind with an advanced HEP.   Time 6   Period Weeks   Status Achieved   PT LONG TERM GOAL #2   Title Return to golfing with pain not > 2/10   Time 6   Period Weeks   Status Achieved   PT LONG TERM GOAL #3   Title Peform all ADL's with pain not > 2/10   Time 6   Period Weeks   Status Achieved             Patient will benefit from skilled therapeutic intervention in order to improve the following deficits and impairments:     Visit Diagnosis: Right shoulder pain     Problem  List Patient Active Problem List   Diagnosis Date Noted  . Eustachian tube dysfunction 12/14/2015  . Hyperlipidemia 06/13/2015  PHYSICAL THERAPY DISCHARGE SUMMARY  Visits from Start of Care: 12  Current functional level related to goals / functional outcomes: Please see above.   Remaining deficits: All goals met.   Education / Equipment: HEP.  Plan: Patient agrees to discharge.  Patient goals were met. Patient is being discharged due to meeting the stated rehab goals.  ?????       APPLEGATE, CHAD MPT 03/20/2016, 6:16 PM   [facility] [address] [place], [place], [address] Phone: [phone]   Fax:  [phone]  Name: [name] MRN: [id number] Date of Birth: 03/28/1951     

## 2016-08-18 ENCOUNTER — Ambulatory Visit: Payer: BLUE CROSS/BLUE SHIELD | Admitting: Family Medicine

## 2016-08-29 ENCOUNTER — Encounter (INDEPENDENT_AMBULATORY_CARE_PROVIDER_SITE_OTHER): Payer: Self-pay | Admitting: *Deleted

## 2016-08-29 ENCOUNTER — Encounter: Payer: Self-pay | Admitting: Family Medicine

## 2016-08-29 ENCOUNTER — Ambulatory Visit (INDEPENDENT_AMBULATORY_CARE_PROVIDER_SITE_OTHER): Payer: Medicare HMO | Admitting: Family Medicine

## 2016-08-29 VITALS — BP 122/62 | HR 51 | Temp 97.0°F | Ht 74.0 in | Wt 218.2 lb

## 2016-08-29 DIAGNOSIS — Z1211 Encounter for screening for malignant neoplasm of colon: Secondary | ICD-10-CM

## 2016-08-29 DIAGNOSIS — E785 Hyperlipidemia, unspecified: Secondary | ICD-10-CM | POA: Diagnosis not present

## 2016-08-29 DIAGNOSIS — Z23 Encounter for immunization: Secondary | ICD-10-CM

## 2016-08-29 DIAGNOSIS — Z1159 Encounter for screening for other viral diseases: Secondary | ICD-10-CM | POA: Diagnosis not present

## 2016-08-29 MED ORDER — TRIAMCINOLONE ACETONIDE 55 MCG/ACT NA AERO: 2.0000 | INHALATION_SPRAY | Freq: Every day | NASAL | 12 refills | Status: DC

## 2016-08-29 MED ORDER — ATORVASTATIN CALCIUM 40 MG PO TABS: 40.0000 mg | ORAL_TABLET | Freq: Every day | ORAL | 3 refills | Status: DC

## 2016-08-29 NOTE — Progress Notes (Signed)
Subjective:  Patient ID: Travis Wood, male    DOB: 10-Feb-1951  Age: 65 y.o. MRN: [id number]  CC: Hyperlipidemia (pt here today for routine follow up on cholesterol, no other problems voiced)   HPI Travis Wood presents for follow-up of elevated cholesterol. Doing well without complaints on current medication. Denies side effects of statin including myalgia and arthralgia and nausea. Also in today for liver function testing. Currently no chest pain, shortness of breath or other cardiovascular related symptoms noted. History Travis Wood has a past medical history of Erectile dysfunction; History of bronchitis; History of colon polyps; Hyperlipemia; Osteoarthritis; and Shortness of breath dyspnea.   He has a past surgical history that includes Appendectomy; Knee surgery; Mass excision (N/A, 09/12/2015); Cataract extraction w/PHACO (Left, 10/15/2015); and Cataract extraction w/PHACO (Right, 11/12/2015).   His family history includes Cancer in his sister; Colon cancer in his father; Colon polyps in his father; Heart disease in his father and mother.He reports that he quit smoking about 24 years ago. His smoking use included Cigarettes. He has a 5.00 pack-year smoking history. He has never used smokeless tobacco. He reports that he drinks about 0.6 oz of alcohol per week . He reports that he does not use drugs.  Current Outpatient Prescriptions on File Prior to Visit  Medication Sig Dispense Refill  . Cholecalciferol (VITAMIN D) 2000 UNITS tablet Take 1 tablet (2,000 Units total) by mouth daily. 30 tablet 11  . Multiple Vitamin tablet Take 1 tablet by mouth daily.     No current facility-administered medications on file prior to visit.     ROS Review of Systems  Constitutional: Negative for chills, diaphoresis, fever and unexpected weight change.  HENT: Negative for congestion, hearing loss, rhinorrhea and sore throat.   Eyes: Negative for visual disturbance.  Respiratory: Negative for cough and  shortness of breath.   Cardiovascular: Negative for chest pain.  Gastrointestinal: Negative for abdominal pain, constipation and diarrhea.  Genitourinary: Negative for dysuria and flank pain.  Musculoskeletal: Negative for arthralgias and joint swelling.  Skin: Negative for rash.  Neurological: Negative for dizziness and headaches.  Psychiatric/Behavioral: Negative for dysphoric mood and sleep disturbance.    Objective:  BP 122/62   Pulse (!) 51   Temp 97 F (36.1 C) (Oral)   Ht '6\' 2"'  (1.88 m)   Wt 218 lb 4 oz (99 kg)   BMI 28.02 kg/m   BP Readings from Last 3 Encounters:  08/29/16 122/62  12/14/15 128/78  11/12/15 112/70    Wt Readings from Last 3 Encounters:  08/29/16 218 lb 4 oz (99 kg)  12/14/15 229 lb 12.8 oz (104.2 kg)  11/12/15 224 lb (101.6 kg)     Physical Exam  Constitutional: He is oriented to person, place, and time. He appears well-developed and well-nourished. No distress.  HENT:  Head: Normocephalic and atraumatic.  Right Ear: External ear normal.  Left Ear: External ear normal.  Nose: Nose normal.  Mouth/Throat: Oropharynx is clear and moist.  Eyes: Conjunctivae and EOM are normal. Pupils are equal, round, and reactive to light.  Neck: Normal range of motion. Neck supple. No thyromegaly present.  Cardiovascular: Normal rate, regular rhythm and normal heart sounds.   No murmur heard. Pulmonary/Chest: Effort normal and breath sounds normal. No respiratory distress. He has no wheezes. He has no rales.  Abdominal: Soft. Bowel sounds are normal. He exhibits no distension. There is no tenderness.  Lymphadenopathy:    He has no cervical adenopathy.  Neurological: He  is alert and oriented to person, place, and time. He has normal reflexes.  Skin: Skin is warm and dry.  Psychiatric: He has a normal mood and affect. His behavior is normal. Judgment and thought content normal.    No results found for: HGBA1C  Lab Results  Component Value Date   WBC 7.9  09/10/2015   HGB 14.4 09/10/2015   HCT 42.9 09/10/2015   PLT 175 09/10/2015   GLUCOSE 103 (H) 12/14/2015   CHOL 145 12/14/2015   TRIG 39 12/14/2015   HDL 46 12/14/2015   LDLCALC 91 12/14/2015   ALT 20 12/14/2015   AST 15 12/14/2015   NA 143 12/14/2015   K 4.5 12/14/2015   CL 105 12/14/2015   CREATININE 0.87 12/14/2015   BUN 12 12/14/2015   CO2 25 12/14/2015   TSH 1.750 05/30/2015   PSA 1.8 02/20/2015    No results found.  Assessment & Plan:   Travis Wood was seen today for hyperlipidemia.  Diagnoses and all orders for this visit:  Hyperlipidemia -     CBC with Differential/Platelet -     CMP14+EGFR -     Lipid panel  Need for hepatitis C screening test -     Hepatitis C antibody  Need for influenza vaccination  Screen for colon cancer -     Ambulatory referral to Gastroenterology  Other orders -     triamcinolone (NASACORT AQ) 55 MCG/ACT AERO nasal inhaler; Place 2 sprays into the nose daily. On each side -     atorvastatin (LIPITOR) 40 MG tablet; Take 1 tablet (40 mg total) by mouth daily. For cholesterol   I have discontinued [name] amoxicillin-clavulanate. I am also having him maintain his Vitamin D, Multiple Vitamin, triamcinolone, and atorvastatin. We will stop administering pneumococcal 23 valent vaccine.  Meds ordered this encounter  Medications  . triamcinolone (NASACORT AQ) 55 MCG/ACT AERO nasal inhaler    Sig: Place 2 sprays into the nose daily. On each side    Dispense:  1 Inhaler    Refill:  12  . atorvastatin (LIPITOR) 40 MG tablet    Sig: Take 1 tablet (40 mg total) by mouth daily. For cholesterol    Dispense:  90 tablet    Refill:  3     Follow-up: Return in about 6 months (around 02/26/2017).  Claretta Fraise, M.D.

## 2016-08-30 LAB — CBC WITH DIFFERENTIAL/PLATELET
BASOS: 0 %
Basophils Absolute: 0 10*3/uL (ref 0.0–0.2)
EOS (ABSOLUTE): 0.1 10*3/uL (ref 0.0–0.4)
EOS: 1 %
HEMATOCRIT: 40.5 % (ref 37.5–51.0)
HEMOGLOBIN: 13.9 g/dL (ref 12.6–17.7)
Immature Grans (Abs): 0 10*3/uL (ref 0.0–0.1)
Immature Granulocytes: 0 %
LYMPHS ABS: 3.1 10*3/uL (ref 0.7–3.1)
Lymphs: 36 %
MCH: 31.3 pg (ref 26.6–33.0)
MCHC: 34.3 g/dL (ref 31.5–35.7)
MCV: 91 fL (ref 79–97)
MONOCYTES: 7 %
Monocytes Absolute: 0.6 10*3/uL (ref 0.1–0.9)
NEUTROS ABS: 4.8 10*3/uL (ref 1.4–7.0)
Neutrophils: 56 %
Platelets: 164 10*3/uL (ref 150–379)
RBC: 4.44 x10E6/uL (ref 4.14–5.80)
RDW: 12.8 % (ref 12.3–15.4)
WBC: 8.6 10*3/uL (ref 3.4–10.8)

## 2016-08-30 LAB — LIPID PANEL
Chol/HDL Ratio: 3 ratio units (ref 0.0–5.0)
Cholesterol, Total: 130 mg/dL (ref 100–199)
HDL: 43 mg/dL (ref >39)
LDL Calculated: 76 mg/dL (ref 0–99)
TRIGLYCERIDES: 53 mg/dL (ref 0–149)
VLDL CHOLESTEROL CAL: 11 mg/dL (ref 5–40)

## 2016-08-30 LAB — CMP14+EGFR
ALK PHOS: 93 IU/L (ref 39–117)
ALT: 11 IU/L (ref 0–44)
AST: 13 IU/L (ref 0–40)
Albumin/Globulin Ratio: 2.3 — ABNORMAL HIGH (ref 1.2–2.2)
Albumin: 4.2 g/dL (ref 3.6–4.8)
BUN/Creatinine Ratio: 17 (ref 10–24)
BUN: 14 mg/dL (ref 8–27)
Bilirubin Total: 0.4 mg/dL (ref 0.0–1.2)
CO2: 28 mmol/L (ref 18–29)
CREATININE: 0.84 mg/dL (ref 0.76–1.27)
Calcium: 8.9 mg/dL (ref 8.6–10.2)
Chloride: 103 mmol/L (ref 96–106)
GFR calc Af Amer: 106 mL/min/{1.73_m2} (ref >59)
GFR calc non Af Amer: 92 mL/min/{1.73_m2} (ref >59)
GLUCOSE: 92 mg/dL (ref 65–99)
Globulin, Total: 1.8 g/dL (ref 1.5–4.5)
Potassium: 4.5 mmol/L (ref 3.5–5.2)
Sodium: 143 mmol/L (ref 134–144)
Total Protein: 6 g/dL (ref 6.0–8.5)

## 2016-08-30 LAB — HEPATITIS C ANTIBODY

## 2017-01-23 ENCOUNTER — Ambulatory Visit (INDEPENDENT_AMBULATORY_CARE_PROVIDER_SITE_OTHER): Payer: Medicare HMO | Admitting: Family Medicine

## 2017-01-23 ENCOUNTER — Encounter: Payer: Self-pay | Admitting: Family Medicine

## 2017-01-23 ENCOUNTER — Ambulatory Visit (INDEPENDENT_AMBULATORY_CARE_PROVIDER_SITE_OTHER): Payer: Medicare HMO

## 2017-01-23 VITALS — BP 105/65 | HR 77 | Temp 98.0°F | Ht 74.0 in | Wt 220.0 lb

## 2017-01-23 DIAGNOSIS — S8001XA Contusion of right knee, initial encounter: Secondary | ICD-10-CM

## 2017-01-23 DIAGNOSIS — M25561 Pain in right knee: Secondary | ICD-10-CM

## 2017-01-23 MED ORDER — DICLOFENAC SODIUM 75 MG PO TBEC: 75.0000 mg | DELAYED_RELEASE_TABLET | Freq: Two times a day (BID) | ORAL | 2 refills | Status: DC

## 2017-01-23 NOTE — Progress Notes (Signed)
Subjective:  Patient ID: Travis Wood, male    DOB: 05/20/1951  Age: 66 y.o. MRN: [id number]  CC: Knee Pain (pt here today c/o right knee pain for 3 weeks. He states he fell on it in the snow.)   HPI Travis Wood presents for Right knee pain. He turned on snow and lost his traction and fell twisting to the right. He landed on the right side of his body hitting the lateral epicondylar region. He's had pain and limping ever since. It has interfered with his ability to ambulate and play golf and do other routine activities including household chores. Pain is moderate. Points to the lateral joint line. It has been increasing over the last few days because he is trying to become more active since the injury should have been healing by now.   History Martina has a past medical history of Erectile dysfunction; History of bronchitis; History of colon polyps; Hyperlipemia; Osteoarthritis; and Shortness of breath dyspnea.   He has a past surgical history that includes Appendectomy; Knee surgery; Mass excision (N/A, 09/12/2015); Cataract extraction w/PHACO (Left, 10/15/2015); and Cataract extraction w/PHACO (Right, 11/12/2015).   His family history includes Cancer in his sister; Colon cancer in his father; Colon polyps in his father; Heart disease in his father and mother.He reports that he quit smoking about 25 years ago. His smoking use included Cigarettes. He has a 5.00 pack-year smoking history. He has never used smokeless tobacco. He reports that he drinks about 0.6 oz of alcohol per week . He reports that he does not use drugs.    ROS Review of Systems  Constitutional: Negative for chills, diaphoresis and fever.  HENT: Negative for rhinorrhea and sore throat.   Respiratory: Negative for cough and shortness of breath.   Cardiovascular: Negative for chest pain.  Gastrointestinal: Negative for abdominal pain.  Musculoskeletal: Positive for arthralgias and gait problem. Negative for myalgias.    Skin: Negative for rash.  Neurological: Negative for weakness and headaches.    Objective:  BP 105/65   Pulse 77   Temp 98 F (36.7 C) (Oral)   Ht 6\' 2"  (1.88 m)   Wt 220 lb (99.8 kg)   BMI 28.25 kg/m   BP Readings from Last 3 Encounters:  01/23/17 105/65  08/29/16 122/62  12/14/15 128/78    Wt Readings from Last 3 Encounters:  01/23/17 220 lb (99.8 kg)  08/29/16 218 lb 4 oz (99 kg)  12/14/15 229 lb 12.8 oz (104.2 kg)     Physical Exam  Constitutional: He is oriented to person, place, and time. He appears well-developed and well-nourished.  HENT:  Head: Normocephalic and atraumatic.  Right Ear: External ear normal.  Left Ear: External ear normal.  Mouth/Throat: No oropharyngeal exudate or posterior oropharyngeal erythema.  Eyes: Pupils are equal, round, and reactive to light.  Neck: Normal range of motion. Neck supple.  Cardiovascular: Normal rate and regular rhythm.   No murmur heard. Pulmonary/Chest: Breath sounds normal. No respiratory distress.  Musculoskeletal: He exhibits tenderness.  Theright knee has limited range of motion with discomfort actively and passively. Gait isantalgic with a limp. The joint lines are nontender anteriorly and medially. Moderate laterally. The patella is palpable without tenderness or edema.  Lachman / anterior drawer signs are negative for signs of instability and pain free. McMurray testing reveals no pop or excessive discomfort. Varus and valgus stress maneuvers do not cause ligamentous stretch or instability.   Neurological: He is alert and oriented to  person, place, and time.  Skin: Skin is warm and dry.  Psychiatric: He has a normal mood and affect.  Vitals reviewed.   No results found.  Assessment & Plan:   Tremel was seen today for knee pain.  Diagnoses and all orders for this visit:  Contusion of right knee, initial encounter -     DG Knee 1-2 Views Right; Future -     Ambulatory referral to Physical  Therapy  Other orders -     diclofenac (VOLTAREN) 75 MG EC tablet; Take 1 tablet (75 mg total) by mouth 2 (two) times daily. For muscle and  Joint pain    I am having Mr. Henao start on diclofenac. I am also having him maintain his Vitamin D, Multiple Vitamin, triamcinolone, and atorvastatin.  Allergies as of 01/23/2017   No Known Allergies     Medication List       Accurate as of 01/23/17 11:59 PM. Always use your most recent med list.          atorvastatin 40 MG tablet Commonly known as:  LIPITOR Take 1 tablet (40 mg total) by mouth daily. For cholesterol   diclofenac 75 MG EC tablet Commonly known as:  VOLTAREN Take 1 tablet (75 mg total) by mouth 2 (two) times daily. For muscle and  Joint pain   Multiple Vitamin tablet Take 1 tablet by mouth daily.   triamcinolone 55 MCG/ACT Aero nasal inhaler Commonly known as:  NASACORT AQ Place 2 sprays into the nose daily. On each side   Vitamin D 2000 units tablet Take 1 tablet (2,000 Units total) by mouth daily.        Follow-up: Return in about 1 month (around 02/20/2017).  Claretta Fraise, M.D.

## 2017-02-02 ENCOUNTER — Encounter: Payer: Self-pay | Admitting: Physical Therapy

## 2017-02-02 ENCOUNTER — Ambulatory Visit: Payer: Medicare HMO | Attending: Family Medicine | Admitting: Physical Therapy

## 2017-02-02 DIAGNOSIS — R262 Difficulty in walking, not elsewhere classified: Secondary | ICD-10-CM

## 2017-02-02 DIAGNOSIS — M25561 Pain in right knee: Secondary | ICD-10-CM | POA: Diagnosis present

## 2017-02-02 DIAGNOSIS — M25661 Stiffness of right knee, not elsewhere classified: Secondary | ICD-10-CM | POA: Diagnosis present

## 2017-02-02 NOTE — Therapy (Addendum)
[facility] [place], [place], [address] Phone: [phone]   Fax:  [phone]  Physical Therapy Evaluation  Patient Details  Name: Travis Wood MRN: [id number] Date of Birth: 02/26/1951 No Data Recorded  Encounter Date: 02/02/2017      PT End of Session - 02/02/17 1216    Visit Number 1   Number of Visits 16   Date for PT Re-Evaluation 04/04/17   PT Start Time [id number]   PT Stop Time 1130   PT Time Calculation (min) 45 min   Activity Tolerance Patient tolerated treatment well   Behavior During Therapy [facility] for tasks assessed/performed      Past Medical History:  Diagnosis Date  . Erectile dysfunction   . History of bronchitis   . History of colon polyps   . Hyperlipemia   . Osteoarthritis   . Shortness of breath dyspnea     Past Surgical History:  Procedure Laterality Date  . APPENDECTOMY    . CATARACT EXTRACTION W/PHACO Left 10/15/2015   Procedure: CATARACT EXTRACTION PHACO AND INTRAOCULAR LENS PLACEMENT LEFT EYE;  Surgeon: Tonny Branch, MD;  Location: [facility];  Service: Ophthalmology;  Laterality: Left;  CDE:12.20  . CATARACT EXTRACTION W/PHACO Right 11/12/2015   Procedure: CATARACT EXTRACTION PHACO AND INTRAOCULAR LENS PLACEMENT RIGHT EYE:  CDE:  8.99;  Surgeon: Tonny Branch, MD;  Location: [facility];  Service: Ophthalmology;  Laterality: Right;  . KNEE SURGERY     Left x 2   . MASS EXCISION N/A 09/12/2015   Procedure: EXCISION SOFT TISSUE NEOPLASM (6 CM), BACK;  Surgeon: Aviva Signs, MD;  Location: [facility];  Service: General;  Laterality: N/A;    There were no vitals filed for this visit.       Subjective Assessment - 02/02/17 1053    Subjective Patient arriving to therapy today complaining of Right knee pain.  Pt reporting fall 3-4 weeks ago as he was walking his dog when it snowed. Pt amb with R knee supportive brace. Patient complaining of 6/10 today which seems to be constant pain.    Limitations House hold  activities;Walking   How long can you sit comfortably? unlimited   How long can you stand comfortably? unlimited with brace    How long can you walk comfortably? with brace and pain   Diagnostic tests X-ray last week   Patient Stated Goals Play golf and return to PLOF with no knee pain   Currently in Pain? Yes   Pain Score 6    Pain Location Knee   Pain Orientation Right   Pain Descriptors / Indicators Aching   Pain Type Acute pain   Pain Onset 1 to 4 weeks ago   Pain Frequency Constant   Aggravating Factors  walking, palying golf   Pain Relieving Factors ice, herbal/plant pain medication            [facility] PT Assessment - 02/02/17 0001      Assessment   Medical Diagnosis Right knee pain   Onset Date/Surgical Date 12/28/16   Hand Dominance Left   Prior Therapy yes for rotator cuff injury     Precautions   Precautions None     Restrictions   Weight Bearing Restrictions No     Balance Screen   Has the patient fallen in the past 6 months Yes   How many times? 1   Has the patient had a decrease in activity level because of a fear of falling?  Yes  Is the patient reluctant to leave their home because of a fear of falling?  No     Home Ecologist residence   Living Arrangements Spouse/significant other   Available Help at Discharge Family   Type of [place] to enter     Prior Function   Level of [place] Retired   Leisure patient enjoys Chiropractor   Overall Cognitive Status Within Functional Limits for tasks assessed     Observation/Other Assessments   Focus on Therapeutic Outcomes (FOTO)  56% limitation     ROM / Strength   AROM / PROM / Strength AROM;Strength     AROM   AROM Assessment Site Knee   Right/Left Knee Right   Right Knee Extension 14   Right Knee Flexion 100     Strength   Overall Strength Within functional limits for tasks performed   Strength  Assessment Site Knee   Right/Left Knee Right   Right Knee Flexion 5/5   Right Knee Extension 5/5     Palpation   Patella mobility painful with lateral movement   Palpation comment tenderness over tibial tuberosity     Special Tests    Special Tests Knee Special Tests   Knee Special tests  Step-up/Step Down Test     Step-up/Step Down    Findings Positive   Side  Right     Transfers   Five time sit to stand comments  11.1 seconds     Ambulation/Gait   Ambulation/Gait Yes                           PT Education - 02/02/17 1115    Education provided No          PT Short Term Goals - 02/02/17 1225      PT SHORT TERM GOAL #1   Title Independent with HEP   Time 3   Period Weeks   Status New           PT Long Term Goals - 02/02/17 1225      PT LONG TERM GOAL #1   Title Ind with an advanced HEP.   Time 8   Period Weeks   Status New     PT LONG TERM GOAL #2   Title Return to golfing with pain not > 2/10   Time 8   Period Weeks   Status New     PT LONG TERM GOAL #3   Title Increase pt's right knee flexion to 120 degrees in order to improve ability to retrieve objects off the floor.    Time 8   Period Weeks   Status New     PT LONG TERM GOAL #4   Title Increase pt's right  knee extension to 5 degrees in order to improve gait.    Baseline ROM right knee 14-100 degrees   Time 8   Period Weeks   Status New     PT LONG TERM GOAL #5   Title pt will be able to ambulate >1000 feet with no knee brace with pain less than /= 2/10.    Baseline 6/10 at baseline using knee brace   Time 8   Period Weeks   Status New         04/08/2017 [id number]  PT G-Codes  Functional Assessment Tool Used (Outpatient Only) FOTO,  clinical assessment  Functional Limitation Mobility: Walking and moving around  Mobility: Walking and Moving Around Current Status [phone]) CK  Mobility: Walking and Moving Around Goal Status [phone]) CK    Late entry G-code for  02/02/17 Kearney Hard, MPT         Plan - 02/02/17 1217    Clinical Impression Statement Patient arriving to therapy today as a low complexity evaluation complaining of 6/10 left knee pain after falling on the ice while walking his dog during the snow 3-4 weeks ago. Pt reporting 6/10 left knee pain. Pt with tenderness noted on lateral knee and tibial tuberosity.Pt also presenting with ROM in Left Knee from 14 degrees - 100 degrees.  Pt's also with crepitus noted with knee flexion and extension. Skilled PT needed to address pt's impairments with the below interventions to help pt return to PLOF.    Rehab Potential Excellent   PT Frequency 2x / week   PT Duration 8 weeks   PT Treatment/Interventions ADLs/Self Care Home Management;Cryotherapy;Electrical Stimulation;Iontophoresis 4mg /ml Dexamethasone;Functional mobility training;Stair training;Gait training;Therapeutic activities;Therapeutic exercise;Balance training;Patient/family education;Passive range of motion;Manual techniques;Dry needling;Taping   PT Next Visit Plan Nustep/bike, quad sets, ROM, hamstring stretches, IFC, manual therapy   PT Home Exercise Plan QS, SLR, heel slides in seated   Consulted and Agree with Plan of Care Patient      Patient will benefit from skilled therapeutic intervention in order to improve the following deficits and impairments:  Difficulty walking, Decreased range of motion, Pain, Decreased activity tolerance  Visit Diagnosis: Acute pain of right knee  Difficulty in walking, not elsewhere classified  Stiffness of right knee, not elsewhere classified     Problem List Patient Active Problem List   Diagnosis Date Noted  . Eustachian tube dysfunction 12/14/2015  . Hyperlipidemia 06/13/2015    Oretha Caprice, MPT  02/02/2017, 12:35 PM  [facility] [place], [place], [address] Phone: [phone]   Fax:  [phone]  Name: Travis Wood MRN: [id number] Date of Birth: 04-04-1951

## 2017-02-02 NOTE — Patient Instructions (Addendum)
   Straight Leg Raise: lift 15 times holding 1-2 seconds and repeat Perform 2 times a day    Heel slides with towel: 15 times,  Perform 2 times a day     Quad Sets:  Press knee down toward the bed/mat table holding 5 seconds each 15 times Perform twice a day

## 2017-02-06 ENCOUNTER — Ambulatory Visit: Payer: Medicare HMO | Admitting: Physical Therapy

## 2017-02-06 DIAGNOSIS — M25661 Stiffness of right knee, not elsewhere classified: Secondary | ICD-10-CM

## 2017-02-06 DIAGNOSIS — M25561 Pain in right knee: Secondary | ICD-10-CM

## 2017-02-06 DIAGNOSIS — R262 Difficulty in walking, not elsewhere classified: Secondary | ICD-10-CM

## 2017-02-06 NOTE — Therapy (Signed)
[facility] [place], [place], [address] Phone: [phone]   Fax:  [phone]  Physical Therapy Treatment  Patient Details  Name: Travis Wood MRN: [id number] Date of Birth: [birth year] No Data Recorded  Encounter Date: 02/06/2017      PT End of Session - 02/06/17 1058    Visit Number 2   Number of Visits 16   Date for PT Re-Evaluation 04/04/17   PT Start Time 0945   PT Stop Time 3335   PT Time Calculation (min) 56 min   Activity Tolerance Patient tolerated treatment well   Behavior During Therapy [facility] for tasks assessed/performed      Past Medical History:  Diagnosis Date  . Erectile dysfunction   . History of bronchitis   . History of colon polyps   . Hyperlipemia   . Osteoarthritis   . Shortness of breath dyspnea     Past Surgical History:  Procedure Laterality Date  . APPENDECTOMY    . CATARACT EXTRACTION W/PHACO Left 10/15/2015   Procedure: CATARACT EXTRACTION PHACO AND INTRAOCULAR LENS PLACEMENT LEFT EYE;  Surgeon: Tonny Branch, MD;  Location: [facility];  Service: Ophthalmology;  Laterality: Left;  CDE:12.20  . CATARACT EXTRACTION W/PHACO Right 11/12/2015   Procedure: CATARACT EXTRACTION PHACO AND INTRAOCULAR LENS PLACEMENT RIGHT EYE:  CDE:  8.99;  Surgeon: Tonny Branch, MD;  Location: [facility];  Service: Ophthalmology;  Laterality: Right;  . KNEE SURGERY     Left x 2   . MASS EXCISION N/A 09/12/2015   Procedure: EXCISION SOFT TISSUE NEOPLASM (6 CM), BACK;  Surgeon: Aviva Signs, MD;  Location: [facility];  Service: General;  Laterality: N/A;    There were no vitals filed for this visit.      Subjective Assessment - 02/06/17 1004    Subjective I'm hoping to put off surgery.   Pain Score 6    Pain Location Knee   Pain Orientation Right   Pain Descriptors / Indicators Aching   Pain Type Acute pain   Pain Onset 1 to 4 weeks ago                         [facility] Adult PT Treatment/Exercise -  02/06/17 0001      Exercises   Exercises Knee/Hip     Knee/Hip Exercises: Aerobic   Stationary Bike 15 minutes.     Knee/Hip Exercises: Machines for Strengthening   Cybex Knee Extension 10# x 5 minutes.   Cybex Knee Flexion 30#x 5 minutes.     Modalities   Modalities Passenger transport manager Location --  Right knee.   Electrical Stimulation Action IFC   Electrical Stimulation Parameters 80-150 Hz x 20 minutes.   Electrical Stimulation Goals Pain     Vasopneumatic   Number Minutes Vasopneumatic  20 minutes   Vasopnuematic Location  --  Right knee.   Vasopneumatic Pressure Medium                  PT Short Term Goals - 02/02/17 1225      PT SHORT TERM GOAL #1   Title Independent with HEP   Time 3   Period Weeks   Status New           PT Long Term Goals - 02/02/17 1225      PT LONG TERM GOAL #1   Title Ind with an advanced HEP.  Time 8   Period Weeks   Status New     PT LONG TERM GOAL #2   Title Return to golfing with pain not > 2/10   Time 8   Period Weeks   Status New     PT LONG TERM GOAL #3   Title Increase pt's right knee flexion to 120 degrees in order to improve ability to retrieve objects off the floor.    Time 8   Period Weeks   Status New     PT LONG TERM GOAL #4   Title Increase pt's right  knee extension to 5 degrees in order to improve gait.    Baseline ROM right knee 14-100 degrees   Time 8   Period Weeks   Status New     PT LONG TERM GOAL #5   Title pt will be able to ambulate >1000 feet with no knee brace with pain less than /= 2/10.    Baseline 6/10 at baseline using knee brace   Time 8   Period Weeks   Status New               Plan - 02/06/17 1059    Clinical Impression Statement No new complaints today.  The patient really enjoyed that last treatment.      Patient will benefit from skilled therapeutic intervention in order to improve the  following deficits and impairments:  Difficulty walking, Decreased range of motion, Pain, Decreased activity tolerance  Visit Diagnosis: Acute pain of right knee  Difficulty in walking, not elsewhere classified  Stiffness of right knee, not elsewhere classified     Problem List Patient Active Problem List   Diagnosis Date Noted  . Eustachian tube dysfunction 12/14/2015  . Hyperlipidemia 06/13/2015    Briyana Badman, [place] MPT 02/06/2017, 11:02 AM  [facility] [address] [place], [place], [address] Phone: [phone]   Fax:  [phone]  Name: Travis Wood MRN: [id number] Date of Birth: [birth year]

## 2017-02-11 ENCOUNTER — Encounter: Payer: Self-pay | Admitting: Physical Therapy

## 2017-02-11 ENCOUNTER — Ambulatory Visit: Payer: Medicare HMO | Admitting: Physical Therapy

## 2017-02-11 DIAGNOSIS — M25561 Pain in right knee: Secondary | ICD-10-CM | POA: Diagnosis not present

## 2017-02-11 DIAGNOSIS — M25661 Stiffness of right knee, not elsewhere classified: Secondary | ICD-10-CM

## 2017-02-11 DIAGNOSIS — R262 Difficulty in walking, not elsewhere classified: Secondary | ICD-10-CM

## 2017-02-11 NOTE — Therapy (Signed)
[place], [place], [address] Phone: [phone]   Fax:  [phone]  Physical Therapy Treatment  Patient Details  Name: Travis Wood MRN: [id number] Date of Birth: [birth year] No Data Recorded  Encounter Date: 02/11/2017      PT End of Session - 02/11/17 1021    Visit Number 3   Number of Visits 16   Date for PT Re-Evaluation 04/04/17   PT Start Time 0944   PT Stop Time 1035   PT Time Calculation (min) 51 min   Activity Tolerance Patient tolerated treatment well   Behavior During Therapy [facility] for tasks assessed/performed      Past Medical History:  Diagnosis Date  . Erectile dysfunction   . History of bronchitis   . History of colon polyps   . Hyperlipemia   . Osteoarthritis   . Shortness of breath dyspnea     Past Surgical History:  Procedure Laterality Date  . APPENDECTOMY    . CATARACT EXTRACTION W/PHACO Left 10/15/2015   Procedure: CATARACT EXTRACTION PHACO AND INTRAOCULAR LENS PLACEMENT LEFT EYE;  Surgeon: Tonny Branch, MD;  Location: [facility];  Service: Ophthalmology;  Laterality: Left;  CDE:12.20  . CATARACT EXTRACTION W/PHACO Right 11/12/2015   Procedure: CATARACT EXTRACTION PHACO AND INTRAOCULAR LENS PLACEMENT RIGHT EYE:  CDE:  8.99;  Surgeon: Tonny Branch, MD;  Location: [facility];  Service: Ophthalmology;  Laterality: Right;  . KNEE SURGERY     Left x 2   . MASS EXCISION N/A 09/12/2015   Procedure: EXCISION SOFT TISSUE NEOPLASM (6 CM), BACK;  Surgeon: Aviva Signs, MD;  Location: [facility];  Service: General;  Laterality: N/A;    There were no vitals filed for this visit.      Subjective Assessment - 02/11/17 0950    Subjective Feeling good today   Currently in Pain? Yes   Pain Score 3    Pain Location Knee   Pain Orientation Right   Pain Descriptors / Indicators Aching   Pain Type Acute pain   Pain Onset 1 to 4 weeks ago                         [facility] Adult PT  Treatment/Exercise - 02/11/17 0001      Knee/Hip Exercises: Aerobic   Stationary Bike 15 minutes     Knee/Hip Exercises: Machines for Strengthening   Cybex Knee Extension 10# x 30   Cybex Knee Flexion 30# x 30   Cybex Leg Press 4 plates x 30     Knee/Hip Exercises: Supine   Straight Leg Raises Strengthening;Right;2 sets;10 reps   Straight Leg Raise with External Rotation Strengthening;Right;2 sets;10 reps     Vasopneumatic   Number Minutes Vasopneumatic  15 minutes   Vasopnuematic Location  Knee   Vasopneumatic Pressure Medium                PT Education - 02/11/17 1021    Education provided Yes   Education Details HEP for hamstring stretch and SLR   Person(s) Educated Patient   Methods Explanation;Demonstration   Comprehension Returned demonstration;Verbalized understanding          PT Short Term Goals - 02/11/17 1023      PT SHORT TERM GOAL #1   Title Independent with HEP   Status On-going           PT Long Term Goals - 02/11/17 1023      PT  LONG TERM GOAL #1   Title Ind with an advanced HEP.   Status On-going     PT LONG TERM GOAL #2   Title Return to golfing with pain not > 2/10   Status On-going     PT LONG TERM GOAL #3   Title Increase pt's right knee flexion to 120 degrees in order to improve ability to retrieve objects off the floor.    Status On-going     PT LONG TERM GOAL #4   Title Increase pt's right  knee extension to 5 degrees in order to improve gait.    Status On-going     PT LONG TERM GOAL #5   Title pt will be able to ambulate >1000 feet with no knee brace with pain less than /= 2/10.    Status On-going               Plan - 02/11/17 1022    Clinical Impression Statement No new complaints, felt good with stretching   Rehab Potential Excellent   PT Frequency 2x / week   PT Duration 8 weeks   PT Treatment/Interventions ADLs/Self Care Home Management;Cryotherapy;Electrical Stimulation;Iontophoresis 4mg /ml  Dexamethasone;Functional mobility training;Stair training;Gait training;Therapeutic activities;Therapeutic exercise;Balance training;Patient/family education;Passive range of motion;Manual techniques;Dry needling;Taping   PT Next Visit Plan continue stretching, add calf and hip stretches, 4 way SLR    PT Home Exercise Plan HS stretch, SLR   Consulted and Agree with Plan of Care Patient      Patient will benefit from skilled therapeutic intervention in order to improve the following deficits and impairments:  Difficulty walking, Decreased range of motion, Pain, Decreased activity tolerance  Visit Diagnosis: Acute pain of right knee  Difficulty in walking, not elsewhere classified  Stiffness of right knee, not elsewhere classified     Problem List Patient Active Problem List   Diagnosis Date Noted  . Eustachian tube dysfunction 12/14/2015  . Hyperlipidemia 06/13/2015    Isabelle Course, PT, DPT 02/11/2017, 10:24 AM  [facility] [address] [place], [place], [address] Phone: [phone]   Fax:  [phone]  Name: Travis Wood MRN: [id number] Date of Birth: December 12, 1950

## 2017-02-13 ENCOUNTER — Ambulatory Visit: Payer: Medicare HMO | Admitting: Physical Therapy

## 2017-02-13 ENCOUNTER — Encounter: Payer: Self-pay | Admitting: Physical Therapy

## 2017-02-13 DIAGNOSIS — M25561 Pain in right knee: Secondary | ICD-10-CM | POA: Diagnosis not present

## 2017-02-13 DIAGNOSIS — R262 Difficulty in walking, not elsewhere classified: Secondary | ICD-10-CM

## 2017-02-13 DIAGNOSIS — M25661 Stiffness of right knee, not elsewhere classified: Secondary | ICD-10-CM

## 2017-02-13 NOTE — Therapy (Signed)
[facility] [place], [place], [address] Phone: [phone]   Fax:  [phone]  Physical Therapy Treatment  Patient Details  Name: SHYLER HOLZMAN MRN: [id number] Date of Birth: 06-03-1951 No Data Recorded  Encounter Date: 02/13/2017      PT End of Session - 02/13/17 1037    Visit Number 4   Number of Visits 16   Date for PT Re-Evaluation 04/04/17   PT Start Time 1030   PT Stop Time 1120   PT Time Calculation (min) 50 min   Activity Tolerance Patient tolerated treatment well   Behavior During Therapy [facility] for tasks assessed/performed      Past Medical History:  Diagnosis Date  . Erectile dysfunction   . History of bronchitis   . History of colon polyps   . Hyperlipemia   . Osteoarthritis   . Shortness of breath dyspnea     Past Surgical History:  Procedure Laterality Date  . APPENDECTOMY    . CATARACT EXTRACTION W/PHACO Left 10/15/2015   Procedure: CATARACT EXTRACTION PHACO AND INTRAOCULAR LENS PLACEMENT LEFT EYE;  Surgeon: Tonny Branch, MD;  Location: [facility];  Service: Ophthalmology;  Laterality: Left;  CDE:12.20  . CATARACT EXTRACTION W/PHACO Right 11/12/2015   Procedure: CATARACT EXTRACTION PHACO AND INTRAOCULAR LENS PLACEMENT RIGHT EYE:  CDE:  8.99;  Surgeon: Tonny Branch, MD;  Location: [facility];  Service: Ophthalmology;  Laterality: Right;  . KNEE SURGERY     Left x 2   . MASS EXCISION N/A 09/12/2015   Procedure: EXCISION SOFT TISSUE NEOPLASM (6 CM), BACK;  Surgeon: Aviva Signs, MD;  Location: [facility];  Service: General;  Laterality: N/A;    There were no vitals filed for this visit.      Subjective Assessment - 02/13/17 1040    Subjective Pt reportin 4/10 pain in R knee on the tibial tuberosity. Pt reporting more stiffness in the morning. Pt also reporting doing his HEP and stretches.    Limitations House hold activities;Walking   How long can you sit comfortably? unlimited   How long can you stand  comfortably? unlimited with brace    How long can you walk comfortably? with brace and pain   Diagnostic tests X-ray last week   Patient Stated Goals Play golf and return to PLOF with no knee pain   Pain Score 4    Pain Location Knee   Pain Orientation Right   Pain Descriptors / Indicators Aching   Pain Type Acute pain   Pain Onset 1 to 4 weeks ago   Pain Frequency Constant   Aggravating Factors  walking, palying golf   Pain Relieving Factors ice, herbal/plant pain medication                         [facility] Adult PT Treatment/Exercise - 02/13/17 0001      Exercises   Exercises Knee/Hip     Knee/Hip Exercises: Aerobic   Stationary Bike 15 minutes level 3 for 11 minutes, lefel 4 x 4 minutes     Knee/Hip Exercises: Machines for Strengthening   Cybex Knee Extension 10# x 30   Cybex Knee Flexion 30# x 30   Cybex Leg Press 4 plates x 30 reps   Other Machine Heel Raises with 2 plates on leg press machine x 30     Knee/Hip Exercises: Standing   Wall Squat 10 reps   Rocker Board 3 minutes     Knee/Hip  Exercises: Supine   Straight Leg Raises Strengthening;Right;2 sets;10 reps   Straight Leg Raise with External Rotation Strengthening;Right;2 sets;10 reps     Modalities   Modalities Electrical Stimulation     Electrical Stimulation   Electrical Stimulation Location knee   Electrical Stimulation Action IFC   Electrical Stimulation Parameters 80-150 Hz x 15 minutes   Electrical Stimulation Goals Pain     Vasopneumatic   Number Minutes Vasopneumatic  15 minutes   Vasopnuematic Location  Knee   Vasopneumatic Pressure Medium   Vasopneumatic Temperature  38                PT Education - 02/13/17 1054    Education Details Hamstring stretch reviewed, mini squats added to HEP   Person(s) Educated Patient   Methods Explanation;Demonstration   Comprehension Verbalized understanding;Returned demonstration          PT Short Term Goals - 02/11/17 1023       PT SHORT TERM GOAL #1   Title Independent with HEP   Status On-going           PT Long Term Goals - 02/13/17 1040      PT LONG TERM GOAL #1   Title Ind with an advanced HEP.   Period Weeks   Status On-going     PT LONG TERM GOAL #2   Title Return to golfing with pain not > 2/10   Time 8   Period Weeks   Status On-going     PT LONG TERM GOAL #3   Title Increase pt's right knee flexion to 120 degrees in order to improve ability to retrieve objects off the floor.    Time 8   Period Weeks   Status On-going     PT LONG TERM GOAL #4   Title Increase pt's right  knee extension to 5 degrees in order to improve gait.    Baseline ROM right knee 14-100 degrees   Period Weeks   Status On-going     PT LONG TERM GOAL #5   Title pt will be able to ambulate >1000 feet with no knee brace with pain less than /= 2/10.    Baseline 6/10 at baseline using knee brace   Time 8   Period Weeks   Status On-going               Plan - 02/13/17 1038    Clinical Impression Statement Pt arriving to therapy reporting 4/10 pain. Pt reporting 2/10 at end of session. pt tolerating cybex machines and leg press well. Pt doing well with HEP. Continue skilled PT to progress toward LTG's met. No goals met this session   Rehab Potential Excellent   PT Frequency 2x / week   PT Duration 8 weeks   PT Treatment/Interventions ADLs/Self Care Home Management;Cryotherapy;Electrical Stimulation;Iontophoresis 27m/ml Dexamethasone;Functional mobility training;Stair training;Gait training;Therapeutic activities;Therapeutic exercise;Balance training;Patient/family education;Passive range of motion;Manual techniques;Dry needling;Taping   PT Next Visit Plan continue stretching, add calf and hip stretches, 4 way SLR    PT Home Exercise Plan HS stretch, SLR   Consulted and Agree with Plan of Care Patient      Patient will benefit from skilled therapeutic intervention in order to improve the following deficits  and impairments:  Difficulty walking, Decreased range of motion, Pain, Decreased activity tolerance  Visit Diagnosis: Acute pain of right knee  Difficulty in walking, not elsewhere classified  Stiffness of right knee, not elsewhere classified     Problem List Patient Active  Problem List   Diagnosis Date Noted  . Eustachian tube dysfunction 12/14/2015  . Hyperlipidemia 06/13/2015    Oretha Caprice, MPT 02/13/2017, 11:01 AM  [facility] [address] [place], [place], [address] Phone: [phone]   Fax:  [phone]  Name: FRANCIS DOENGES MRN: [id number] Date of Birth: Dec 14, 1950

## 2017-02-18 ENCOUNTER — Encounter: Payer: Self-pay | Admitting: Physical Therapy

## 2017-02-18 ENCOUNTER — Ambulatory Visit: Payer: Medicare HMO | Admitting: Physical Therapy

## 2017-02-18 DIAGNOSIS — M25561 Pain in right knee: Secondary | ICD-10-CM

## 2017-02-18 DIAGNOSIS — R262 Difficulty in walking, not elsewhere classified: Secondary | ICD-10-CM

## 2017-02-18 DIAGNOSIS — M25661 Stiffness of right knee, not elsewhere classified: Secondary | ICD-10-CM

## 2017-02-18 NOTE — Therapy (Signed)
[facility] [place], [place], [address] Phone: [phone]   Fax:  [phone]  Physical Therapy Treatment  Patient Details  Name: Travis Wood MRN: [id number] Date of Birth: 27-Aug-1951 No Data Recorded  Encounter Date: 02/18/2017      PT End of Session - 02/18/17 1425    Visit Number 5   Number of Visits 16   Date for PT Re-Evaluation 04/04/17   PT Start Time 8416   PT Stop Time 1438   PT Time Calculation (min) 50 min   Activity Tolerance Patient tolerated treatment well   Behavior During Therapy [facility] for tasks assessed/performed      Past Medical History:  Diagnosis Date  . Erectile dysfunction   . History of bronchitis   . History of colon polyps   . Hyperlipemia   . Osteoarthritis   . Shortness of breath dyspnea     Past Surgical History:  Procedure Laterality Date  . APPENDECTOMY    . CATARACT EXTRACTION W/PHACO Left 10/15/2015   Procedure: CATARACT EXTRACTION PHACO AND INTRAOCULAR LENS PLACEMENT LEFT EYE;  Surgeon: Tonny Branch, MD;  Location: [facility];  Service: Ophthalmology;  Laterality: Left;  CDE:12.20  . CATARACT EXTRACTION W/PHACO Right 11/12/2015   Procedure: CATARACT EXTRACTION PHACO AND INTRAOCULAR LENS PLACEMENT RIGHT EYE:  CDE:  8.99;  Surgeon: Tonny Branch, MD;  Location: [facility];  Service: Ophthalmology;  Laterality: Right;  . KNEE SURGERY     Left x 2   . MASS EXCISION N/A 09/12/2015   Procedure: EXCISION SOFT TISSUE NEOPLASM (6 CM), BACK;  Surgeon: Aviva Signs, MD;  Location: [facility];  Service: General;  Laterality: N/A;    There were no vitals filed for this visit.      Subjective Assessment - 02/18/17 1348    Subjective Reports that R knee is getting better. Reports playing golf yesterday with no issues and had knee brace donned. Still has issues and pain with first standing or getting in or out of car. Has to stretch HS before knee can lay flat.   Limitations House hold  activities;Walking   How long can you sit comfortably? unlimited   How long can you stand comfortably? unlimited with brace    How long can you walk comfortably? with brace and pain   Diagnostic tests X-ray last week   Patient Stated Goals Play golf and return to PLOF with no knee pain   Currently in Pain? Yes   Pain Score 4    Pain Location Knee   Pain Orientation Right   Pain Descriptors / Indicators Discomfort   Pain Type Acute pain   Pain Onset 1 to 4 weeks ago            [facility] PT Assessment - 02/18/17 0001      Assessment   Medical Diagnosis Right knee pain   Onset Date/Surgical Date 12/28/16   Hand Dominance Left   Next MD Visit Unsure   Prior Therapy yes for rotator cuff injury     Precautions   Precautions None     Restrictions   Weight Bearing Restrictions No                     [facility] Adult PT Treatment/Exercise - 02/18/17 0001      Knee/Hip Exercises: Aerobic   Stationary Bike L2 x15 min     Knee/Hip Exercises: Machines for Strengthening   Cybex Knee Extension 20# x 30   Cybex  Knee Flexion 40# x 30   Cybex Leg Press 4 plates x 30 reps     Knee/Hip Exercises: Standing   Terminal Knee Extension Limitations RLE Pink XTS x20 reps 3 sec hold     Knee/Hip Exercises: Supine   Straight Leg Raises Strengthening;Right;2 sets;10 reps   Straight Leg Raise with External Rotation Strengthening;Right;2 sets;10 reps     Modalities   Modalities Electrical Stimulation;Vasopneumatic     Electrical Stimulation   Electrical Stimulation Location R knee   Electrical Stimulation Action IFC   Electrical Stimulation Parameters 1-10 hz x15 min   Electrical Stimulation Goals Pain     Vasopneumatic   Number Minutes Vasopneumatic  15 minutes   Vasopnuematic Location  Knee   Vasopneumatic Pressure Medium   Vasopneumatic Temperature  34                  PT Short Term Goals - 02/11/17 1023      PT SHORT TERM GOAL #1   Title Independent with HEP    Status On-going           PT Long Term Goals - 02/13/17 1040      PT LONG TERM GOAL #1   Title Ind with an advanced HEP.   Period Weeks   Status On-going     PT LONG TERM GOAL #2   Title Return to golfing with pain not > 2/10   Time 8   Period Weeks   Status On-going     PT LONG TERM GOAL #3   Title Increase pt's right knee flexion to 120 degrees in order to improve ability to retrieve objects off the floor.    Time 8   Period Weeks   Status On-going     PT LONG TERM GOAL #4   Title Increase pt's right  knee extension to 5 degrees in order to improve gait.    Baseline ROM right knee 14-100 degrees   Period Weeks   Status On-going     PT LONG TERM GOAL #5   Title pt will be able to ambulate >1000 feet with no knee brace with pain less than /= 2/10.    Baseline 6/10 at baseline using knee brace   Time 8   Period Weeks   Status On-going               Plan - 02/18/17 1424    Clinical Impression Statement Patient tolerated today's treatment well with low R knee pain upon arrival but no reports of any increased pain during exercises. Patient presented in clinic with R knee brace donned but was removed for therapeutic exercise. Patient able to be guided through various knee strengthening with minimal to moderate multimodal cueing for proper exercise technique. Normal modalities response noted following removal of the modalities.   Rehab Potential Excellent   PT Frequency 2x / week   PT Duration 8 weeks   PT Treatment/Interventions ADLs/Self Care Home Management;Cryotherapy;Electrical Stimulation;Iontophoresis 4mg /ml Dexamethasone;Functional mobility training;Stair training;Gait training;Therapeutic activities;Therapeutic exercise;Balance training;Patient/family education;Passive range of motion;Manual techniques;Dry needling;Taping   PT Next Visit Plan continue stretching, add calf and hip stretches, 4 way SLR    PT Home Exercise Plan HS stretch, SLR   Consulted  and Agree with Plan of Care Patient      Patient will benefit from skilled therapeutic intervention in order to improve the following deficits and impairments:  Difficulty walking, Decreased range of motion, Pain, Decreased activity tolerance  Visit Diagnosis: Acute pain of  right knee  Difficulty in walking, not elsewhere classified  Stiffness of right knee, not elsewhere classified     Problem List Patient Active Problem List   Diagnosis Date Noted  . Eustachian tube dysfunction 12/14/2015  . Hyperlipidemia 06/13/2015    Wynelle Fanny, PTA 02/18/2017, 2:39 PM  [facility] [address] [place], [place], [address] Phone: [phone]   Fax:  [phone]  Name: Travis Wood MRN: [id number] Date of Birth: January 26, 1951

## 2017-02-23 ENCOUNTER — Encounter: Payer: Medicare HMO | Admitting: Physical Therapy

## 2017-02-26 ENCOUNTER — Ambulatory Visit: Payer: Medicare HMO | Admitting: Physical Therapy

## 2017-02-26 ENCOUNTER — Encounter: Payer: Self-pay | Admitting: Physical Therapy

## 2017-02-26 DIAGNOSIS — M25561 Pain in right knee: Secondary | ICD-10-CM | POA: Diagnosis not present

## 2017-02-26 DIAGNOSIS — M25661 Stiffness of right knee, not elsewhere classified: Secondary | ICD-10-CM

## 2017-02-26 DIAGNOSIS — R262 Difficulty in walking, not elsewhere classified: Secondary | ICD-10-CM

## 2017-02-26 NOTE — Therapy (Signed)
[facility] [place], [place], [address] Phone: [phone]   Fax:  [phone]  Physical Therapy Treatment  Patient Details  Name: Travis Wood MRN: [id number] Date of Birth: [birth year] No Data Recorded  Encounter Date: 02/26/2017      PT End of Session - 02/26/17 1257    Visit Number 6   Number of Visits 16   Date for PT Re-Evaluation 04/04/17   PT Start Time 1200   PT Stop Time 1244   PT Time Calculation (min) 44 min   Activity Tolerance Patient tolerated treatment well   Behavior During Therapy [facility] for tasks assessed/performed      Past Medical History:  Diagnosis Date  . Erectile dysfunction   . History of bronchitis   . History of colon polyps   . Hyperlipemia   . Osteoarthritis   . Shortness of breath dyspnea     Past Surgical History:  Procedure Laterality Date  . APPENDECTOMY    . CATARACT EXTRACTION W/PHACO Left 10/15/2015   Procedure: CATARACT EXTRACTION PHACO AND INTRAOCULAR LENS PLACEMENT LEFT EYE;  Surgeon: Tonny Branch, MD;  Location: [facility];  Service: Ophthalmology;  Laterality: Left;  CDE:12.20  . CATARACT EXTRACTION W/PHACO Right 11/12/2015   Procedure: CATARACT EXTRACTION PHACO AND INTRAOCULAR LENS PLACEMENT RIGHT EYE:  CDE:  8.99;  Surgeon: Tonny Branch, MD;  Location: [facility];  Service: Ophthalmology;  Laterality: Right;  . KNEE SURGERY     Left x 2   . MASS EXCISION N/A 09/12/2015   Procedure: EXCISION SOFT TISSUE NEOPLASM (6 CM), BACK;  Surgeon: Aviva Signs, MD;  Location: [facility];  Service: General;  Laterality: N/A;    There were no vitals filed for this visit.      Subjective Assessment - 02/26/17 1210    Subjective Patient reported playing golf and progressing well   Limitations House hold activities;Walking   How long can you sit comfortably? unlimited   How long can you stand comfortably? unlimited with brace    How long can you walk comfortably? with brace and pain   Diagnostic tests X-ray last week   Patient Stated Goals Play golf and return to PLOF with no knee pain   Currently in Pain? Yes   Pain Score 4    Pain Location Knee   Pain Orientation Right   Pain Descriptors / Indicators Discomfort   Pain Onset 1 to 4 weeks ago   Pain Frequency Constant   Aggravating Factors  prolong walking or golf   Pain Relieving Factors ice and rest            [facility] PT Assessment - 02/26/17 0001      ROM / Strength   AROM / PROM / Strength AROM;PROM     AROM   AROM Assessment Site Knee   Right/Left Knee Right   Right Knee Extension -14   Right Knee Flexion 114     PROM   PROM Assessment Site Knee   Right/Left Knee Right   Right Knee Extension -9   Right Knee Flexion 116                     [facility] Adult PT Treatment/Exercise - 02/26/17 0001      Knee/Hip Exercises: Aerobic   Stationary Bike L2-3 x15 min     Knee/Hip Exercises: Machines for Strengthening   Cybex Knee Extension 30# x 30   Cybex Knee Flexion 50# x 30   Cybex  Leg Press 4 plates x 30 reps     Electrical Stimulation   Electrical Stimulation Location R knee   Electrical Stimulation Action IFC   Electrical Stimulation Parameters 1-_0  x76mn   Electrical Stimulation Goals Pain     Vasopneumatic   Number Minutes Vasopneumatic  15 minutes   Vasopnuematic Location  Knee   Vasopneumatic Pressure Medium     Manual Therapy   Manual Therapy Passive ROM   Passive ROM gentle PROM to right knee for flexion and ext                  PT Short Term Goals - 02/26/17 1308      PT SHORT TERM GOAL #1   Title Independent with HEP   Time 3   Period Weeks   Status On-going           PT Long Term Goals - 02/26/17 1220      PT LONG TERM GOAL #1   Title Ind with an advanced HEP.   Time 8   Period Weeks   Status On-going     PT LONG TERM GOAL #2   Title Return to golfing with pain not > 2/10   Time 8   Period Weeks   Status Achieved  no pain 02/26/17      PT LONG TERM GOAL #3   Title Increase pt's right knee flexion to 120 degrees in order to improve ability to retrieve objects off the floor.    Time 8   Period Weeks   Status On-going  AROM 114 degrees 02/26/17     PT LONG TERM GOAL #4   Title Increase pt's right  knee extension to 5 degrees in order to improve gait.    Baseline ROM right knee 14-100 degrees   Time 8   Period Weeks   Status On-going  AROM -13 degrees 02/26/17     PT LONG TERM GOAL #5   Title pt will be able to ambulate >1000 feet with no knee brace with pain less than /= 2/10.    Baseline 6/10 at baseline using knee brace   Time 8   Period Weeks   Status On-going               Plan - 02/26/17 1300    Clinical Impression Statement Patient tolerated treatment well today. Patient reported some overall improvement yet ongoing discomfort. Patient wears his brace with any outside activity and golf. Patient has no pain when playing golf per patient. Patient met  LTG #2 and others ongoing due to ROM, pain and prolong walking defcits.    Rehab Potential Excellent   PT Frequency 2x / week   PT Duration 8 weeks   PT Treatment/Interventions ADLs/Self Care Home Management;Cryotherapy;Electrical Stimulation;Iontophoresis 463mml Dexamethasone;Functional mobility training;Stair training;Gait training;Therapeutic activities;Therapeutic exercise;Balance training;Patient/family education;Passive range of motion;Manual techniques;Dry needling;Taping   PT Next Visit Plan cont with POC   Consulted and Agree with Plan of Care Patient      Patient will benefit from skilled therapeutic intervention in order to improve the following deficits and impairments:  Difficulty walking, Decreased range of motion, Pain, Decreased activity tolerance  Visit Diagnosis: Acute pain of right knee  Difficulty in walking, not elsewhere classified  Stiffness of right knee, not elsewhere classified     Problem List Patient Active Problem  List   Diagnosis Date Noted  . Eustachian tube dysfunction 12/14/2015  . Hyperlipidemia 06/13/2015    DUNFORD, CHRISTINA P, PTA 02/26/2017,  1:13 PM  [facility] [place], [place], [address] Phone: [phone]   Fax:  [phone]  Name: Travis Wood MRN: [id number] Date of Birth: [birth year]

## 2017-03-04 ENCOUNTER — Encounter: Payer: Self-pay | Admitting: Physical Therapy

## 2017-03-04 ENCOUNTER — Ambulatory Visit: Payer: Medicare HMO | Attending: Family Medicine | Admitting: Physical Therapy

## 2017-03-04 DIAGNOSIS — M25561 Pain in right knee: Secondary | ICD-10-CM | POA: Diagnosis not present

## 2017-03-04 DIAGNOSIS — R262 Difficulty in walking, not elsewhere classified: Secondary | ICD-10-CM

## 2017-03-04 DIAGNOSIS — M25661 Stiffness of right knee, not elsewhere classified: Secondary | ICD-10-CM | POA: Diagnosis present

## 2017-03-04 NOTE — Therapy (Signed)
[facility] [place], [place], [address] Phone: [phone]   Fax:  [phone]  Physical Therapy Treatment  Patient Details  Name: Travis Wood MRN: [id number] Date of Birth: Feb 27, 1951 No Data Recorded  Encounter Date: 03/04/2017      PT End of Session - 03/04/17 0905    Visit Number 7   Number of Visits 16   Date for PT Re-Evaluation 04/04/17   PT Start Time 0903   PT Stop Time 0954   PT Time Calculation (min) 51 min   Activity Tolerance Patient tolerated treatment well   Behavior During Therapy [facility] for tasks assessed/performed      Past Medical History:  Diagnosis Date  . Erectile dysfunction   . History of bronchitis   . History of colon polyps   . Hyperlipemia   . Osteoarthritis   . Shortness of breath dyspnea     Past Surgical History:  Procedure Laterality Date  . APPENDECTOMY    . CATARACT EXTRACTION W/PHACO Left 10/15/2015   Procedure: CATARACT EXTRACTION PHACO AND INTRAOCULAR LENS PLACEMENT LEFT EYE;  Surgeon: Tonny Branch, MD;  Location: [facility];  Service: Ophthalmology;  Laterality: Left;  CDE:12.20  . CATARACT EXTRACTION W/PHACO Right 11/12/2015   Procedure: CATARACT EXTRACTION PHACO AND INTRAOCULAR LENS PLACEMENT RIGHT EYE:  CDE:  8.99;  Surgeon: Tonny Branch, MD;  Location: [facility];  Service: Ophthalmology;  Laterality: Right;  . KNEE SURGERY     Left x 2   . MASS EXCISION N/A 09/12/2015   Procedure: EXCISION SOFT TISSUE NEOPLASM (6 CM), BACK;  Surgeon: Aviva Signs, MD;  Location: [facility];  Service: General;  Laterality: N/A;    There were no vitals filed for this visit.      Subjective Assessment - 03/04/17 0905    Subjective Patient states that he played golf yesterday with no issues or pain. Now having R elbow pain though.   Limitations House hold activities;Walking   How long can you sit comfortably? unlimited   How long can you stand comfortably? unlimited with brace    How long can you  walk comfortably? with brace and pain   Diagnostic tests X-ray last week   Patient Stated Goals Play golf and return to PLOF with no knee pain   Currently in Pain? No/denies            [facility] PT Assessment - 03/04/17 0001      Assessment   Medical Diagnosis Right knee pain   Onset Date/Surgical Date 12/28/16   Hand Dominance Left   Next MD Visit Unsure   Prior Therapy yes for rotator cuff injury     Precautions   Precautions None     Restrictions   Weight Bearing Restrictions No     ROM / Strength   AROM / PROM / Strength AROM     AROM   Overall AROM  Deficits;Within functional limits for tasks performed   AROM Assessment Site Knee   Right/Left Knee Right   Right Knee Extension -12   Right Knee Flexion 120                     [facility] Adult PT Treatment/Exercise - 03/04/17 0001      Knee/Hip Exercises: Aerobic   Stationary Bike L3 x15 min     Knee/Hip Exercises: Machines for Strengthening   Cybex Knee Extension 30# x 30   Cybex Knee Flexion 50# x 30   Cybex Leg  Press 4 plates x 30 reps   Other Machine B heel raises with 2 plates on leg press machine x 30     Knee/Hip Exercises: Standing   Terminal Knee Extension Limitations RLE Pink XTS x20 reps 3 sec hold     Knee/Hip Exercises: Supine   Straight Leg Raises Strengthening;Right;2 sets;10 reps     Modalities   Modalities Electrical Stimulation;Vasopneumatic     Electrical Stimulation   Electrical Stimulation Location R knee   Electrical Stimulation Action IFC   Electrical Stimulation Parameters 1-10 hz x15 min   Electrical Stimulation Goals Pain     Vasopneumatic   Number Minutes Vasopneumatic  15 minutes   Vasopnuematic Location  Knee   Vasopneumatic Pressure Medium   Vasopneumatic Temperature  55                  PT Short Term Goals - 03/04/17 0945      PT SHORT TERM GOAL #1   Title Independent with HEP   Time 3   Period Weeks   Status Achieved           PT Long  Term Goals - 03/04/17 5852      PT LONG TERM GOAL #1   Title Ind with an advanced HEP.   Time 8   Period Weeks   Status On-going     PT LONG TERM GOAL #2   Title Return to golfing with pain not > 2/10   Time 8   Period Weeks   Status Achieved  no pain 02/26/17     PT LONG TERM GOAL #3   Title Increase pt's right knee flexion to 120 degrees in order to improve ability to retrieve objects off the floor.    Time 8   Period Weeks   Status Achieved  AROM 120 degrees 03/04/17     PT LONG TERM GOAL #4   Title Increase pt's right  knee extension to 5 degrees in order to improve gait.    Baseline ROM right knee 14-100 degrees   Time 8   Period Weeks   Status On-going  AROM -12 degrees 03/04/17     PT LONG TERM GOAL #5   Title pt will be able to ambulate >1000 feet with no knee brace with pain less than /= 2/10.    Baseline 6/10 at baseline using knee brace   Time 8   Period Weeks   Status Achieved               Plan - 03/04/17 7782    Clinical Impression Statement Patient tolerated today's treatment well with no reports of pain prior to or during exercises. Patient guided through machine knee strengthening and resisted knee strengthening with moderate multimodal cueing especially demonstration for proper form. AROM R knee measured as 12-120 deg today in supine. Patient able to achieve ambulation and R knee flexion goal today per measurements and patient report. Normal modalities response noted following removal of the modalities.   Rehab Potential Excellent   PT Frequency 2x / week   PT Duration 8 weeks   PT Treatment/Interventions ADLs/Self Care Home Management;Cryotherapy;Electrical Stimulation;Iontophoresis 4mg /ml Dexamethasone;Functional mobility training;Stair training;Gait training;Therapeutic activities;Therapeutic exercise;Balance training;Patient/family education;Passive range of motion;Manual techniques;Dry needling;Taping   PT Next Visit Plan cont with POC   PT Home  Exercise Plan HS stretch, SLR   Consulted and Agree with Plan of Care Patient      Patient will benefit from skilled therapeutic intervention in order to  improve the following deficits and impairments:  Difficulty walking, Decreased range of motion, Pain, Decreased activity tolerance  Visit Diagnosis: Acute pain of right knee  Difficulty in walking, not elsewhere classified  Stiffness of right knee, not elsewhere classified     Problem List Patient Active Problem List   Diagnosis Date Noted  . Eustachian tube dysfunction 12/14/2015  . Hyperlipidemia 06/13/2015    Wynelle Fanny, PTA 03/04/2017, 9:59 AM  [facility] [address] [place], [place], [address] Phone: [phone]   Fax:  [phone]  Name: Travis Wood MRN: [id number] Date of Birth: 07/02/1951

## 2017-03-06 ENCOUNTER — Ambulatory Visit: Payer: Medicare HMO | Admitting: *Deleted

## 2017-03-06 DIAGNOSIS — M25561 Pain in right knee: Secondary | ICD-10-CM | POA: Diagnosis not present

## 2017-03-06 DIAGNOSIS — M25661 Stiffness of right knee, not elsewhere classified: Secondary | ICD-10-CM

## 2017-03-06 DIAGNOSIS — R262 Difficulty in walking, not elsewhere classified: Secondary | ICD-10-CM

## 2017-03-06 NOTE — Therapy (Signed)
[name], [place], [address] Phone: [phone]   Fax:  [phone]  Physical Therapy Treatment  Patient Details  Name: Travis Wood MRN: [id number] Date of Birth: 26-Apr-1951 No Data Recorded  Encounter Date: 03/06/2017      PT End of Session - 03/06/17 1043    Visit Number 8   Number of Visits 16   Date for PT Re-Evaluation 04/04/17   PT Start Time 1030   PT Stop Time 1125   PT Time Calculation (min) 55 min      Past Medical History:  Diagnosis Date  . Erectile dysfunction   . History of bronchitis   . History of colon polyps   . Hyperlipemia   . Osteoarthritis   . Shortness of breath dyspnea     Past Surgical History:  Procedure Laterality Date  . APPENDECTOMY    . CATARACT EXTRACTION W/PHACO Left 10/15/2015   Procedure: CATARACT EXTRACTION PHACO AND INTRAOCULAR LENS PLACEMENT LEFT EYE;  Surgeon: Tonny Branch, MD;  Location: [facility];  Service: Ophthalmology;  Laterality: Left;  CDE:12.20  . CATARACT EXTRACTION W/PHACO Right 11/12/2015   Procedure: CATARACT EXTRACTION PHACO AND INTRAOCULAR LENS PLACEMENT RIGHT EYE:  CDE:  8.99;  Surgeon: Tonny Branch, MD;  Location: [facility];  Service: Ophthalmology;  Laterality: Right;  . KNEE SURGERY     Left x 2   . MASS EXCISION N/A 09/12/2015   Procedure: EXCISION SOFT TISSUE NEOPLASM (6 CM), BACK;  Surgeon: Aviva Signs, MD;  Location: [facility];  Service: General;  Laterality: N/A;    There were no vitals filed for this visit.      Subjective Assessment - 03/06/17 1042    Subjective Patient states that he played golf yesterday with no issues or pain. Now having R elbow pain though.  2/10 today   Limitations House hold activities;Walking   How long can you sit comfortably? unlimited   How long can you stand comfortably? unlimited with brace    How long can you walk comfortably? with brace and pain   Diagnostic tests X-ray last week   Patient Stated Goals Play  golf and return to PLOF with no knee pain   Currently in Pain? Yes   Pain Score 2    Pain Location Knee   Pain Orientation Right   Pain Descriptors / Indicators Discomfort   Pain Type Acute pain   Pain Onset 1 to 4 weeks ago   Pain Frequency Constant                         [facility] Adult PT Treatment/Exercise - 03/06/17 0001      Knee/Hip Exercises: Aerobic   Stationary Bike L3 x15 min     Knee/Hip Exercises: Machines for Strengthening   Cybex Knee Extension 30# x 45   Cybex Knee Flexion 50# x 45   Cybex Leg Press 4 plates x 45 reps   Other Machine B heel raises with 2 plates on leg press machine x 30     Knee/Hip Exercises: Standing   Terminal Knee Extension Limitations RLE Pink XTS x30 reps 3 sec hold     Modalities   Modalities Electrical Stimulation;Vasopneumatic     Electrical Stimulation   Electrical Stimulation Location R knee  IFC 1-'10hz'  x 15 mins   Electrical Stimulation Goals Pain     Vasopneumatic   Number Minutes Vasopneumatic  15 minutes   Vasopnuematic Location  Knee  Vasopneumatic Pressure Medium   Vasopneumatic Temperature  38                  PT Short Term Goals - 03/04/17 0945      PT SHORT TERM GOAL #1   Title Independent with HEP   Time 3   Period Weeks   Status Achieved           PT Long Term Goals - 03/04/17 0973      PT LONG TERM GOAL #1   Title Ind with an advanced HEP.   Time 8   Period Weeks   Status On-going     PT LONG TERM GOAL #2   Title Return to golfing with pain not > 2/10   Time 8   Period Weeks   Status Achieved  no pain 02/26/17     PT LONG TERM GOAL #3   Title Increase pt's right knee flexion to 120 degrees in order to improve ability to retrieve objects off the floor.    Time 8   Period Weeks   Status Achieved  AROM 120 degrees 03/04/17     PT LONG TERM GOAL #4   Title Increase pt's right  knee extension to 5 degrees in order to improve gait.    Baseline ROM right knee 14-100  degrees   Time 8   Period Weeks   Status On-going  AROM -12 degrees 03/04/17     PT LONG TERM GOAL #5   Title pt will be able to ambulate >1000 feet with no knee brace with pain less than /= 2/10.    Baseline 6/10 at baseline using knee brace   Time 8   Period Weeks   Status Achieved               Plan - 03/06/17 1116    Clinical Impression Statement Pt arrived to clinic today feeling fairly well and RT at 2/10. He was able to perform all therex and act.'s with minimal pain in RT knee. He has met the majority of goals , but unable to meet LTG for knee extension ROM to -5 degrees due to tightness.   Rehab Potential Excellent   PT Frequency 2x / week   PT Duration 8 weeks   PT Treatment/Interventions ADLs/Self Care Home Management;Cryotherapy;Electrical Stimulation;Iontophoresis 64m/ml Dexamethasone;Functional mobility training;Stair training;Gait training;Therapeutic activities;Therapeutic exercise;Balance training;Patient/family education;Passive range of motion;Manual techniques;Dry needling;Taping   PT Next Visit Plan cont with POC   PT Home Exercise Plan HS stretch, SLR   Consulted and Agree with Plan of Care Patient      Patient will benefit from skilled therapeutic intervention in order to improve the following deficits and impairments:  Difficulty walking, Decreased range of motion, Pain, Decreased activity tolerance  Visit Diagnosis: Acute pain of right knee  Difficulty in walking, not elsewhere classified  Stiffness of right knee, not elsewhere classified     Problem List Patient Active Problem List   Diagnosis Date Noted  . Eustachian tube dysfunction 12/14/2015  . Hyperlipidemia 06/13/2015    Travis Wood,CHRIS, PTA 03/06/2017, 11:29 AM  [facility] [address]Phone: [phone]  Fax:  [phone] Name: Travis BARTELLMRN: [id number] of Birth: 807-16-1952

## 2017-03-09 ENCOUNTER — Ambulatory Visit: Payer: Medicare HMO | Admitting: Physical Therapy

## 2017-03-09 ENCOUNTER — Encounter: Payer: Self-pay | Admitting: Physical Therapy

## 2017-03-09 DIAGNOSIS — M25561 Pain in right knee: Secondary | ICD-10-CM | POA: Diagnosis not present

## 2017-03-09 DIAGNOSIS — M25661 Stiffness of right knee, not elsewhere classified: Secondary | ICD-10-CM

## 2017-03-09 DIAGNOSIS — R262 Difficulty in walking, not elsewhere classified: Secondary | ICD-10-CM

## 2017-03-09 NOTE — Therapy (Signed)
[facility] [place], [place], [address] Phone: [phone]   Fax:  [phone]  Physical Therapy Treatment  Patient Details  Name: Travis Wood MRN: [id number] Date of Birth: [birth year] No Data Recorded  Encounter Date: 03/09/2017      PT End of Session - 03/09/17 0909    Visit Number 9   Number of Visits 16   Date for PT Re-Evaluation 04/04/17   PT Start Time 0900   PT Stop Time 0945   PT Time Calculation (min) 45 min   Activity Tolerance Patient tolerated treatment well   Behavior During Therapy [facility] for tasks assessed/performed      Past Medical History:  Diagnosis Date  . Erectile dysfunction   . History of bronchitis   . History of colon polyps   . Hyperlipemia   . Osteoarthritis   . Shortness of breath dyspnea     Past Surgical History:  Procedure Laterality Date  . APPENDECTOMY    . CATARACT EXTRACTION W/PHACO Left 10/15/2015   Procedure: CATARACT EXTRACTION PHACO AND INTRAOCULAR LENS PLACEMENT LEFT EYE;  Surgeon: Tonny Branch, MD;  Location: [facility];  Service: Ophthalmology;  Laterality: Left;  CDE:12.20  . CATARACT EXTRACTION W/PHACO Right 11/12/2015   Procedure: CATARACT EXTRACTION PHACO AND INTRAOCULAR LENS PLACEMENT RIGHT EYE:  CDE:  8.99;  Surgeon: Tonny Branch, MD;  Location: [facility];  Service: Ophthalmology;  Laterality: Right;  . KNEE SURGERY     Left x 2   . MASS EXCISION N/A 09/12/2015   Procedure: EXCISION SOFT TISSUE NEOPLASM (6 CM), BACK;  Surgeon: Aviva Signs, MD;  Location: [facility];  Service: General;  Laterality: N/A;    There were no vitals filed for this visit.      Subjective Assessment - 03/09/17 0905    Subjective Patient states he played golf over the weekend with discomfort, but was excited he was able to play.    Limitations House hold activities;Walking   How long can you sit comfortably? unlimited   How long can you stand comfortably? unlimited with brace    How long can you  walk comfortably? with brace and pain   Diagnostic tests X-ray last week   Patient Stated Goals Play golf and return to PLOF with no knee pain   Currently in Pain? Yes   Pain Score 2    Pain Location Knee   Pain Orientation Right   Pain Descriptors / Indicators Discomfort   Pain Type Acute pain   Pain Onset More than a month ago   Pain Frequency Constant   Aggravating Factors  prolonged walking, squating, bending   Pain Relieving Factors ice/ rest            [facility] PT Assessment - 03/09/17 0001      Assessment   Medical Diagnosis right knee pain   Onset Date/Surgical Date 12/28/16   Hand Dominance Left   Next MD Visit unsure   Prior Therapy yes for rotator cuff injury     Precautions   Precautions None     Restrictions   Weight Bearing Restrictions No                     [facility] Adult PT Treatment/Exercise - 03/09/17 0001      Knee/Hip Exercises: Aerobic   Stationary Bike L3 x15 min     Knee/Hip Exercises: Machines for Strengthening   Cybex Knee Extension 30# x 45   Cybex Knee Flexion 50#  x 45   Cybex Leg Press 4 plates x 45 reps   Other Machine B heel raises with 2 plates on leg press machine x 30     Knee/Hip Exercises: Standing   Terminal Knee Extension Limitations RLE Pink XTS x30 reps 3 sec hold     Electrical Stimulation   Electrical Stimulation Location R knee  IFC 1-'10hz'  x 15 mins   Electrical Stimulation Goals Pain     Vasopneumatic   Number Minutes Vasopneumatic  15 minutes   Vasopnuematic Location  Knee   Vasopneumatic Pressure Medium   Vasopneumatic Temperature  38                PT Education - 03/09/17 0908    Education Details HEP reviewed verbally   Person(s) Educated Patient   Methods Explanation   Comprehension Verbalized understanding          PT Short Term Goals - 03/04/17 0945      PT SHORT TERM GOAL #1   Title Independent with HEP   Time 3   Period Weeks   Status Achieved           PT Long Term  Goals - 03/09/17 0911      PT LONG TERM GOAL #1   Title Ind with an advanced HEP.   Time 8   Period Weeks   Status Achieved     PT LONG TERM GOAL #2   Title Return to golfing with pain not > 2/10   Baseline patient reporting 2-3/10 pain over the weekend.    Time 8   Period Weeks   Status Partially Met     PT LONG TERM GOAL #3   Title Increase pt's right knee flexion to 120 degrees in order to improve ability to retrieve objects off the floor.    Time 8   Period Weeks   Status Achieved     PT LONG TERM GOAL #4   Title Increase pt's right  knee extension to 5 degrees in order to improve gait.    Baseline ROM right knee 14-100 degrees   Period Weeks   Status On-going     PT LONG TERM GOAL #5   Title pt will be able to ambulate >1000 feet with no knee brace with pain less than /= 2/10.    Time 8   Period Weeks   Status Achieved               Plan - 03/09/17 0909    Clinical Impression Statement Patient arriving to clinic today with 2/10 pain in his R anterior knee. Pt was able to perform all ther exercises during session and reported is now able to play golf with just some discomfort. Pt still progressing with R knee extension ROM. Continue skilled PT  to progress toward all goals set.    Rehab Potential Excellent   PT Frequency 2x / week   PT Duration 8 weeks   PT Treatment/Interventions ADLs/Self Care Home Management;Cryotherapy;Electrical Stimulation;Iontophoresis 67m/ml Dexamethasone;Functional mobility training;Stair training;Gait training;Therapeutic activities;Therapeutic exercise;Balance training;Patient/family education;Passive range of motion;Manual techniques;Dry needling;Taping   PT Next Visit Plan increase pt's weights on Cybex and leg press if pt tolerates   PT Home Exercise Plan HS stretch, SLR   Consulted and Agree with Plan of Care Patient      Patient will benefit from skilled therapeutic intervention in order to improve the following deficits and  impairments:  Difficulty walking, Decreased range of motion, Pain, Decreased activity  tolerance  Visit Diagnosis: Acute pain of right knee  Difficulty in walking, not elsewhere classified  Stiffness of right knee, not elsewhere classified     Problem List Patient Active Problem List   Diagnosis Date Noted  . Eustachian tube dysfunction 12/14/2015  . Hyperlipidemia 06/13/2015    Oretha Caprice, MPT 03/09/2017, 9:35 AM  [facility] [address] [place], [place], [address] Phone: [phone]   Fax:  [phone]  Name: Travis Wood MRN: [id number] Date of Birth: [birth year]

## 2017-03-13 ENCOUNTER — Ambulatory Visit: Payer: Medicare HMO | Admitting: Physical Therapy

## 2017-03-13 DIAGNOSIS — M25561 Pain in right knee: Secondary | ICD-10-CM

## 2017-03-13 DIAGNOSIS — R262 Difficulty in walking, not elsewhere classified: Secondary | ICD-10-CM

## 2017-03-13 DIAGNOSIS — M25661 Stiffness of right knee, not elsewhere classified: Secondary | ICD-10-CM

## 2017-03-13 NOTE — Therapy (Signed)
[facility] [place], [place], [address] Phone: [phone]   Fax:  [phone]  Physical Therapy Treatment  Patient Details  Name: ILEY BREEDEN MRN: [id number] Date of Birth: [birth year] No Data Recorded  Encounter Date: 03/13/2017      PT End of Session - 03/13/17 0938    Visit Number 10   Number of Visits 16   Date for PT Re-Evaluation 04/04/17   PT Start Time 0903   PT Stop Time 0953   PT Time Calculation (min) 50 min   Activity Tolerance Patient tolerated treatment well   Behavior During Therapy [facility] for tasks assessed/performed      Past Medical History:  Diagnosis Date  . Erectile dysfunction   . History of bronchitis   . History of colon polyps   . Hyperlipemia   . Osteoarthritis   . Shortness of breath dyspnea     Past Surgical History:  Procedure Laterality Date  . APPENDECTOMY    . CATARACT EXTRACTION W/PHACO Left 10/15/2015   Procedure: CATARACT EXTRACTION PHACO AND INTRAOCULAR LENS PLACEMENT LEFT EYE;  Surgeon: Tonny Branch, MD;  Location: [facility];  Service: Ophthalmology;  Laterality: Left;  CDE:12.20  . CATARACT EXTRACTION W/PHACO Right 11/12/2015   Procedure: CATARACT EXTRACTION PHACO AND INTRAOCULAR LENS PLACEMENT RIGHT EYE:  CDE:  8.99;  Surgeon: Tonny Branch, MD;  Location: [facility];  Service: Ophthalmology;  Laterality: Right;  . KNEE SURGERY     Left x 2   . MASS EXCISION N/A 09/12/2015   Procedure: EXCISION SOFT TISSUE NEOPLASM (6 CM), BACK;  Surgeon: Aviva Signs, MD;  Location: [facility];  Service: General;  Laterality: N/A;    There were no vitals filed for this visit.      Subjective Assessment - 03/13/17 0907    Subjective I'm doing good.   Pain Score 2    Pain Location Knee   Pain Orientation Right   Pain Descriptors / Indicators Discomfort   Pain Onset More than a month ago   Pain Frequency Constant                         [facility] Adult PT Treatment/Exercise -  03/13/17 0001      Exercises   Exercises Knee/Hip     Knee/Hip Exercises: Aerobic   Stationary Bike Level 4 x 15 minutes.     Knee/Hip Exercises: Machines for Strengthening   Cybex Knee Extension 30# x 4 minutes.   Cybex Knee Flexion 50# x 4 minutes.   Cybex Leg Press 4 plates x 4 minutes.     Acupuncturist Location Right knee.   Electrical Stimulation Action IFC x 15 minutes.   Electrical Stimulation Goals Pain     Vasopneumatic   Number Minutes Vasopneumatic  15 minutes   Vasopnuematic Location  --  Right knee.   Vasopneumatic Pressure Medium                  PT Short Term Goals - 03/04/17 0945      PT SHORT TERM GOAL #1   Title Independent with HEP   Time 3   Period Weeks   Status Achieved           PT Long Term Goals - 03/13/17 0950      PT LONG TERM GOAL #1   Title Ind with an advanced HEP.   Time 8   Period Weeks  Status Achieved     PT LONG TERM GOAL #2   Title Return to golfing with pain not > 2/10   Baseline patient reporting 2-3/10 pain over the weekend.    Time 8   Period Weeks   Status Partially Met     PT LONG TERM GOAL #3   Title Increase pt's right knee flexion to 120 degrees in order to improve ability to retrieve objects off the floor.    Time 8   Period Weeks   Status Achieved     PT LONG TERM GOAL #4   Title Increase pt's right  knee extension to 5 degrees in order to improve gait.    Baseline ROM right knee 14-100 degrees   Time 8   Period Weeks   Status On-going     PT LONG TERM GOAL #5   Title pt will be able to ambulate >1000 feet with no knee brace with pain less than /= 2/10.    Baseline 6/10 at baseline using knee brace   Time 8   Period Weeks   Status Achieved               Plan - 03/13/17 0939    Clinical Impression Statement Patient progressng very well.  He golfed yesterday without difficulty.  He had no pain reports after tretament.      Patient will  benefit from skilled therapeutic intervention in order to improve the following deficits and impairments:  Difficulty walking, Decreased range of motion, Pain, Decreased activity tolerance  Visit Diagnosis: Acute pain of right knee  Difficulty in walking, not elsewhere classified  Stiffness of right knee, not elsewhere classified     Problem List Patient Active Problem List   Diagnosis Date Noted  . Eustachian tube dysfunction 12/14/2015  . Hyperlipidemia 06/13/2015    Dorianna Mckiver, [place] MPT 03/13/2017, 9:57 AM  [facility] [address] [place], [place], [address] Phone: [phone]   Fax:  [phone]  Name: AITAN ROSSBACH MRN: [id number] Date of Birth: Apr 21, 1951

## 2017-03-16 ENCOUNTER — Encounter: Payer: Medicare HMO | Admitting: Physical Therapy

## 2017-03-20 ENCOUNTER — Ambulatory Visit: Payer: Medicare HMO | Admitting: Physical Therapy

## 2017-03-20 DIAGNOSIS — R262 Difficulty in walking, not elsewhere classified: Secondary | ICD-10-CM

## 2017-03-20 DIAGNOSIS — M25561 Pain in right knee: Secondary | ICD-10-CM

## 2017-03-20 DIAGNOSIS — M25661 Stiffness of right knee, not elsewhere classified: Secondary | ICD-10-CM

## 2017-03-20 NOTE — Therapy (Signed)
[facility] [place], [place], [address] Phone: [phone]   Fax:  [phone]  Physical Therapy Treatment  Patient Details  Name: Travis Wood MRN: [id number] Date of Birth: [birth year] No Data Recorded  Encounter Date: 03/20/2017      PT End of Session - 03/20/17 0941    Visit Number 11   Number of Visits 16   Date for PT Re-Evaluation 04/04/17   PT Start Time 0902   PT Stop Time 0956   PT Time Calculation (min) 54 min   Activity Tolerance Patient tolerated treatment well   Behavior During Therapy [facility] for tasks assessed/performed      Past Medical History:  Diagnosis Date  . Erectile dysfunction   . History of bronchitis   . History of colon polyps   . Hyperlipemia   . Osteoarthritis   . Shortness of breath dyspnea     Past Surgical History:  Procedure Laterality Date  . APPENDECTOMY    . CATARACT EXTRACTION W/PHACO Left 10/15/2015   Procedure: CATARACT EXTRACTION PHACO AND INTRAOCULAR LENS PLACEMENT LEFT EYE;  Surgeon: Tonny Branch, MD;  Location: [facility];  Service: Ophthalmology;  Laterality: Left;  CDE:12.20  . CATARACT EXTRACTION W/PHACO Right 11/12/2015   Procedure: CATARACT EXTRACTION PHACO AND INTRAOCULAR LENS PLACEMENT RIGHT EYE:  CDE:  8.99;  Surgeon: Tonny Branch, MD;  Location: [facility];  Service: Ophthalmology;  Laterality: Right;  . KNEE SURGERY     Left x 2   . MASS EXCISION N/A 09/12/2015   Procedure: EXCISION SOFT TISSUE NEOPLASM (6 CM), BACK;  Surgeon: Aviva Signs, MD;  Location: [facility];  Service: General;  Laterality: N/A;    There were no vitals filed for this visit.      Subjective Assessment - 03/20/17 0903    Subjective had a stomach bug earlier this week.  knee is a little sore today.  played golf yesterday-no pain while playing but this morning 2/10   Patient Stated Goals Play golf and return to PLOF with no knee pain   Currently in Pain? Yes   Pain Score 2    Pain Location Knee   Pain Orientation Right   Pain Descriptors / Indicators Discomfort   Pain Type Acute pain   Pain Onset More than a month ago   Pain Frequency Constant   Aggravating Factors  prolonged walking, squatting, bending   Pain Relieving Factors ice, rest            [facility] PT Assessment - 03/20/17 0925      Observation/Other Assessments   Focus on Therapeutic Outcomes (FOTO)  58 (42% limited)     AROM   Right Knee Extension -12   Right Knee Flexion 116                     [facility] Adult PT Treatment/Exercise - 03/20/17 0905      Knee/Hip Exercises: Aerobic   Stationary Bike Level 4 x 15 minutes.     Knee/Hip Exercises: Machines for Strengthening   Cybex Knee Extension 40# 3x15   Cybex Knee Flexion 50# 3x15   Cybex Leg Press 4 plates 3x10     Electrical Stimulation   Electrical Stimulation Location Right knee.   Electrical Stimulation Action IFC x 15 min   Electrical Stimulation Goals Pain     Vasopneumatic   Number Minutes Vasopneumatic  15 minutes   Vasopnuematic Location  --  Right knee.   Vasopneumatic Pressure Medium  Vasopneumatic Temperature  34                  PT Short Term Goals - 03/04/17 0945      PT SHORT TERM GOAL #1   Title Independent with HEP   Time 3   Period Weeks   Status Achieved           PT Long Term Goals - 03-25-17 0941      PT LONG TERM GOAL #1   Title Ind with an advanced HEP.   Status Achieved     PT LONG TERM GOAL #2   Title Return to golfing with pain not > 2/10   Status Achieved     PT LONG TERM GOAL #3   Title Increase pt's right knee flexion to 120 degrees in order to improve ability to retrieve objects off the floor.    Status Achieved     PT LONG TERM GOAL #4   Title Increase pt's right  knee extension to 5 degrees in order to improve gait.    Status On-going     PT LONG TERM GOAL #5   Title pt will be able to ambulate >1000 feet with no knee brace with pain less than /= 2/10.    Status  Achieved               Plan - 2017/03/25 9622    Clinical Impression Statement Pt is progressing well towards LTGs.  Extension continues to be limited.  Will continue to benefit from PT to maximize function.   PT Treatment/Interventions ADLs/Self Care Home Management;Cryotherapy;Electrical Stimulation;Iontophoresis 4mg /ml Dexamethasone;Functional mobility training;Stair training;Gait training;Therapeutic activities;Therapeutic exercise;Balance training;Patient/family education;Passive range of motion;Manual techniques;Dry needling;Taping   PT Next Visit Plan TKE exercises, progress per POC   Consulted and Agree with Plan of Care Patient      Patient will benefit from skilled therapeutic intervention in order to improve the following deficits and impairments:  Difficulty walking, Decreased range of motion, Pain, Decreased activity tolerance  Visit Diagnosis: Acute pain of right knee  Difficulty in walking, not elsewhere classified  Stiffness of right knee, not elsewhere classified       G-Codes - 03/25/17 2979    Functional Assessment Tool Used (Outpatient Only) FOTO, clinical assessment   Functional Limitation Mobility: Walking and moving around   Mobility: Walking and Moving Around Current Status ([id number]) At least 40 percent but less than 60 percent impaired, limited or restricted   Mobility: Walking and Moving Around Goal Status [phone]) At least 40 percent but less than 60 percent impaired, limited or restricted      Problem List Patient Active Problem List   Diagnosis Date Noted  . Eustachian tube dysfunction 12/14/2015  . Hyperlipidemia 06/13/2015      Laureen Abrahams, PT, DPT 03/25/17 9:43 AM    [facility] [address] [place], [place], [address] Phone: [phone]   Fax:  [phone]  Name: Travis Wood MRN: [id number] Date of Birth: 1951/04/10

## 2017-03-23 ENCOUNTER — Ambulatory Visit: Payer: Medicare HMO | Admitting: Physical Therapy

## 2017-03-23 ENCOUNTER — Encounter: Payer: Self-pay | Admitting: Physical Therapy

## 2017-03-23 DIAGNOSIS — M25561 Pain in right knee: Secondary | ICD-10-CM | POA: Diagnosis not present

## 2017-03-23 DIAGNOSIS — M25661 Stiffness of right knee, not elsewhere classified: Secondary | ICD-10-CM

## 2017-03-23 DIAGNOSIS — R262 Difficulty in walking, not elsewhere classified: Secondary | ICD-10-CM

## 2017-03-23 NOTE — Therapy (Signed)
[facility] [place], [place], [address] Phone: [phone]   Fax:  [phone]  Physical Therapy Treatment  Patient Details  Name: Travis Wood MRN: [id number] Date of Birth: 1951/09/11 No Data Recorded  Encounter Date: 03/23/2017      PT End of Session - 03/23/17 0817    Visit Number 12   Number of Visits 16   Date for PT Re-Evaluation 04/04/17   PT Start Time 0815   PT Stop Time 0904   PT Time Calculation (min) 49 min   Activity Tolerance Patient tolerated treatment well   Behavior During Therapy [facility] for tasks assessed/performed      Past Medical History:  Diagnosis Date  . Erectile dysfunction   . History of bronchitis   . History of colon polyps   . Hyperlipemia   . Osteoarthritis   . Shortness of breath dyspnea     Past Surgical History:  Procedure Laterality Date  . APPENDECTOMY    . CATARACT EXTRACTION W/PHACO Left 10/15/2015   Procedure: CATARACT EXTRACTION PHACO AND INTRAOCULAR LENS PLACEMENT LEFT EYE;  Surgeon: Tonny Branch, MD;  Location: [facility];  Service: Ophthalmology;  Laterality: Left;  CDE:12.20  . CATARACT EXTRACTION W/PHACO Right 11/12/2015   Procedure: CATARACT EXTRACTION PHACO AND INTRAOCULAR LENS PLACEMENT RIGHT EYE:  CDE:  8.99;  Surgeon: Tonny Branch, MD;  Location: [facility];  Service: Ophthalmology;  Laterality: Right;  . KNEE SURGERY     Left x 2   . MASS EXCISION N/A 09/12/2015   Procedure: EXCISION SOFT TISSUE NEOPLASM (6 CM), BACK;  Surgeon: Aviva Signs, MD;  Location: [facility];  Service: General;  Laterality: N/A;    There were no vitals filed for this visit.      Subjective Assessment - 03/23/17 0817    Subjective Reports that his knee is a little sore today.   Limitations House hold activities;Walking   How long can you sit comfortably? unlimited   How long can you stand comfortably? unlimited with brace    How long can you walk comfortably? with brace and pain   Diagnostic  tests X-ray last week   Patient Stated Goals Play golf and return to PLOF with no knee pain   Currently in Pain? Yes   Pain Score 2    Pain Location Knee   Pain Orientation Right   Pain Descriptors / Indicators Sore   Pain Type Acute pain   Pain Onset More than a month ago            [facility] PT Assessment - 03/23/17 0001      Assessment   Medical Diagnosis right knee pain   Onset Date/Surgical Date 12/28/16   Hand Dominance Left   Next MD Visit unsure   Prior Therapy yes for rotator cuff injury     Precautions   Precautions None     Restrictions   Weight Bearing Restrictions No                     [facility] Adult PT Treatment/Exercise - 03/23/17 0001      Knee/Hip Exercises: Aerobic   Stationary Bike L3 x15 min     Knee/Hip Exercises: Machines for Strengthening   Cybex Knee Extension 40# 3x15   Cybex Knee Flexion 50# 3x15   Cybex Leg Press 4 plates 3x10, seat 7     Knee/Hip Exercises: Standing   Terminal Knee Extension Limitations RLE Orange XTS x20 reps 3 sec hold  Step Down Right;2 sets;10 reps;Hand Hold: 2;Step Height: 4"  4" step heel dot     Knee/Hip Exercises: Sidelying   Hip ABduction AROM;Right;3 sets;10 reps     Modalities   Modalities Passenger transport manager Location R knee   Electrical Stimulation Action IFC   Electrical Stimulation Parameters 1-10 hz x15 min   Electrical Stimulation Goals Pain     Vasopneumatic   Number Minutes Vasopneumatic  15 minutes   Vasopnuematic Location  Knee   Vasopneumatic Pressure Medium   Vasopneumatic Temperature  34                  PT Short Term Goals - 03/04/17 0945      PT SHORT TERM GOAL #1   Title Independent with HEP   Time 3   Period Weeks   Status Achieved           PT Long Term Goals - 03/20/17 0941      PT LONG TERM GOAL #1   Title Ind with an advanced HEP.   Status Achieved     PT LONG TERM GOAL #2    Title Return to golfing with pain not > 2/10   Status Achieved     PT LONG TERM GOAL #3   Title Increase pt's right knee flexion to 120 degrees in order to improve ability to retrieve objects off the floor.    Status Achieved     PT LONG TERM GOAL #4   Title Increase pt's right  knee extension to 5 degrees in order to improve gait.    Status On-going     PT LONG TERM GOAL #5   Title pt will be able to ambulate >1000 feet with no knee brace with pain less than /= 2/10.    Status Achieved               Plan - 03/23/17 0854    Clinical Impression Statement Patient tolerated today's treatment well with no complaints of any increased pain from patient. Patient able to now play golf without brace and without pain per patient report. Continues to be compliant to HEP for extension stretch. Normal modalities response noted following removal of the modalities.   Rehab Potential Excellent   PT Frequency 2x / week   PT Duration 8 weeks   PT Treatment/Interventions ADLs/Self Care Home Management;Cryotherapy;Electrical Stimulation;Iontophoresis 4mg /ml Dexamethasone;Functional mobility training;Stair training;Gait training;Therapeutic activities;Therapeutic exercise;Balance training;Patient/family education;Passive range of motion;Manual techniques;Dry needling;Taping   PT Next Visit Plan TKE exercises, progress per POC   PT Home Exercise Plan HS stretch, SLR   Consulted and Agree with Plan of Care Patient      Patient will benefit from skilled therapeutic intervention in order to improve the following deficits and impairments:  Difficulty walking, Decreased range of motion, Pain, Decreased activity tolerance  Visit Diagnosis: Acute pain of right knee  Difficulty in walking, not elsewhere classified  Stiffness of right knee, not elsewhere classified     Problem List Patient Active Problem List   Diagnosis Date Noted  . Eustachian tube dysfunction 12/14/2015  . Hyperlipidemia  06/13/2015    Wynelle Fanny, PTA 03/23/2017, 9:46 AM  [facility] [address] [place], [place], [address] Phone: [phone]   Fax:  [phone]  Name: Travis Wood MRN: [id number] Date of Birth: November 15, 1951

## 2017-03-27 ENCOUNTER — Encounter: Payer: Self-pay | Admitting: Physical Therapy

## 2017-03-27 ENCOUNTER — Ambulatory Visit: Payer: Medicare HMO | Admitting: Physical Therapy

## 2017-03-27 DIAGNOSIS — M25561 Pain in right knee: Secondary | ICD-10-CM

## 2017-03-27 DIAGNOSIS — R262 Difficulty in walking, not elsewhere classified: Secondary | ICD-10-CM

## 2017-03-27 DIAGNOSIS — M25661 Stiffness of right knee, not elsewhere classified: Secondary | ICD-10-CM

## 2017-03-27 NOTE — Therapy (Addendum)
[facility] [place], [place], [address] Phone: [phone]   Fax:  [phone]  Physical Therapy Treatment  Patient Details  Name: Travis Wood MRN: [id number] Date of Birth: 1950-12-30 No Data Recorded  Encounter Date: 03/27/2017      PT End of Session - 03/27/17 1022    Visit Number 13   Number of Visits 16   Date for PT Re-Evaluation 04/04/17   PT Start Time 0912   PT Stop Time 0945   PT Time Calculation (min) 33 min   Activity Tolerance Patient tolerated treatment well   Behavior During Therapy [facility] for tasks assessed/performed      Past Medical History:  Diagnosis Date  . Erectile dysfunction   . History of bronchitis   . History of colon polyps   . Hyperlipemia   . Osteoarthritis   . Shortness of breath dyspnea     Past Surgical History:  Procedure Laterality Date  . APPENDECTOMY    . CATARACT EXTRACTION W/PHACO Left 10/15/2015   Procedure: CATARACT EXTRACTION PHACO AND INTRAOCULAR LENS PLACEMENT LEFT EYE;  Surgeon: Tonny Branch, MD;  Location: [facility];  Service: Ophthalmology;  Laterality: Left;  CDE:12.20  . CATARACT EXTRACTION W/PHACO Right 11/12/2015   Procedure: CATARACT EXTRACTION PHACO AND INTRAOCULAR LENS PLACEMENT RIGHT EYE:  CDE:  8.99;  Surgeon: Tonny Branch, MD;  Location: [facility];  Service: Ophthalmology;  Laterality: Right;  . KNEE SURGERY     Left x 2   . MASS EXCISION N/A 09/12/2015   Procedure: EXCISION SOFT TISSUE NEOPLASM (6 CM), BACK;  Surgeon: Aviva Signs, MD;  Location: [facility];  Service: General;  Laterality: N/A;    There were no vitals filed for this visit.      Subjective Assessment - 03/27/17 1022    Subjective Patient denied any pain today.   Limitations House hold activities;Walking   How long can you sit comfortably? unlimited   How long can you stand comfortably? unlimited with brace    How long can you walk comfortably? with brace and pain   Diagnostic tests X-ray last  week   Patient Stated Goals Play golf and return to PLOF with no knee pain   Currently in Pain? No/denies            [facility] PT Assessment - 03/27/17 0001      Assessment   Medical Diagnosis right knee pain   Onset Date/Surgical Date 12/28/16   Hand Dominance Left   Next MD Visit unsure   Prior Therapy yes for rotator cuff injury     Precautions   Precautions None     Restrictions   Weight Bearing Restrictions No                     [facility] Adult PT Treatment/Exercise - 03/27/17 0001      Knee/Hip Exercises: Aerobic   Stationary Bike L3 x15 min     Knee/Hip Exercises: Machines for Strengthening   Cybex Knee Extension 40# 3x15   Cybex Knee Flexion 50# 3x15   Cybex Leg Press 3 plates 3x10, seat 7     Knee/Hip Exercises: Standing   Terminal Knee Extension Limitations RLE Orange XTS x20 reps 3 sec hold     Knee/Hip Exercises: Supine   Straight Leg Raises Strengthening;Right;2 sets;10 reps     Knee/Hip Exercises: Sidelying   Hip ABduction AROM;Right;3 sets;10 reps  PT Short Term Goals - 03/04/17 0945      PT SHORT TERM GOAL #1   Title Independent with HEP   Time 3   Period Weeks   Status Achieved           PT Long Term Goals - 03/20/17 0941      PT LONG TERM GOAL #1   Title Ind with an advanced HEP.   Status Achieved     PT LONG TERM GOAL #2   Title Return to golfing with pain not > 2/10   Status Achieved     PT LONG TERM GOAL #3   Title Increase pt's right knee flexion to 120 degrees in order to improve ability to retrieve objects off the floor.    Status Achieved     PT LONG TERM GOAL #4   Title Increase pt's right  knee extension to 5 degrees in order to improve gait.    Status On-going     PT LONG TERM GOAL #5   Title pt will be able to ambulate >1000 feet with no knee brace with pain less than /= 2/10.    Status Achieved               Plan - 03/27/17 1024    Clinical Impression Statement  Patient tolerated today's treatment well with no complaints during any aspect of treatment. Patient able to complete machine strengthening with good form and technique. Extensor lag observed during SLR today and still minimal hip weakness noted in SL hip abduction. Patient denied any modalities today as he was to go play golf.   Rehab Potential Excellent   PT Frequency 2x / week   PT Duration 8 weeks   PT Treatment/Interventions ADLs/Self Care Home Management;Cryotherapy;Electrical Stimulation;Iontophoresis 51m/ml Dexamethasone;Functional mobility training;Stair training;Gait training;Therapeutic activities;Therapeutic exercise;Balance training;Patient/family education;Passive range of motion;Manual techniques;Dry needling;Taping   PT Next Visit Plan TKE exercises, progress per POC   PT Home Exercise Plan HS stretch, SLR   Consulted and Agree with Plan of Care Patient      Patient will benefit from skilled therapeutic intervention in order to improve the following deficits and impairments:  Difficulty walking, Decreased range of motion, Pain, Decreased activity tolerance  Visit Diagnosis: Acute pain of right knee  Difficulty in walking, not elsewhere classified  Stiffness of right knee, not elsewhere classified     Problem List Patient Active Problem List   Diagnosis Date Noted  . Eustachian tube dysfunction 12/14/2015  . Hyperlipidemia 06/13/2015    Travis Wood 03/27/2017, 10:29 AM  [facility] [address] [place] [address]Phone: [phone]  Fax:  [phone] Name: Travis Wood: [id number] of Birth: [birth year] PHYSICAL THERAPY DISCHARGE SUMMARY  Visits from Start of Care: 13.  Current functional level related to goals / functional outcomes: See above.   Remaining deficits: Goal #4 unmet.   Education / Equipment: HEP. Plan: Patient agrees to discharge.  Patient goals were partially  met. Patient is being discharged due to being pleased with the current functional level.  ?????         CMaliApplegate MPT

## 2017-05-06 ENCOUNTER — Ambulatory Visit (INDEPENDENT_AMBULATORY_CARE_PROVIDER_SITE_OTHER): Payer: Medicare HMO | Admitting: Family Medicine

## 2017-05-06 ENCOUNTER — Encounter: Payer: Self-pay | Admitting: Family Medicine

## 2017-05-06 ENCOUNTER — Encounter (INDEPENDENT_AMBULATORY_CARE_PROVIDER_SITE_OTHER): Payer: Self-pay

## 2017-05-06 VITALS — BP 129/78 | HR 60 | Temp 97.7°F | Ht 74.0 in | Wt 223.0 lb

## 2017-05-06 DIAGNOSIS — H6123 Impacted cerumen, bilateral: Secondary | ICD-10-CM | POA: Diagnosis not present

## 2017-05-06 NOTE — Progress Notes (Signed)
Chief Complaint  Patient presents with  . Ear Problem    pt here today c/o "can't hear all of a sudden in my right ear" since last week. Pt denies pain in the ear and states he used drops in his ear and flushed it to clean out some wax but it didn't help.    HPI  Patient presents today for Feeling like his right ear is plugged. Did not respond to attempts to flush it out even after using some over-the-counter drops. Ongoing for about a week. It hit him Suddenly. He Denies Any Upper Respiratory Symptoms Including Fever Chills Sweats Congestion and Sore Throat or Nasal Discharge. Of note is that there is no ear pain it just feels like it's underwater.  PMH: Smoking status noted ROS: Per HPI  Objective: BP 129/78   Pulse 60   Temp 97.7 F (36.5 C) (Oral)   Ht 6\' 2"  (1.88 m)   Wt 223 lb (101.2 kg)   BMI 28.63 kg/m  Gen: NAD, alert, cooperative with exam HEENT: NCAT, EOMI, PERRL. The TMs are occluded by excessive wax in the external auditory canals CV: RRR, good S1/S2, no murmur Resp: CTABL, no wheezes, non-labored Neuro: Alert and oriented, No gross deficits  Assessment and plan:  1. Bilateral hearing loss due to cerumen impaction     Ear lavage with warm water and syringe. Hearing restored subsequently.  No orders of the defined types were placed in this encounter.   Follow up as needed.  Claretta Fraise, MD

## 2017-09-30 DIAGNOSIS — R69 Illness, unspecified: Secondary | ICD-10-CM | POA: Diagnosis not present

## 2017-10-08 ENCOUNTER — Ambulatory Visit (INDEPENDENT_AMBULATORY_CARE_PROVIDER_SITE_OTHER): Payer: Medicare HMO | Admitting: Family Medicine

## 2017-10-08 ENCOUNTER — Encounter: Payer: Self-pay | Admitting: Family Medicine

## 2017-10-08 VITALS — BP 115/58 | HR 62 | Temp 97.3°F | Ht 74.0 in | Wt 228.2 lb

## 2017-10-08 DIAGNOSIS — L989 Disorder of the skin and subcutaneous tissue, unspecified: Secondary | ICD-10-CM | POA: Diagnosis not present

## 2017-10-08 MED ORDER — CEPHALEXIN 500 MG PO CAPS: 500.0000 mg | ORAL_CAPSULE | Freq: Three times a day (TID) | ORAL | 0 refills | Status: DC

## 2017-10-08 NOTE — Patient Instructions (Signed)
Great to see you!   

## 2017-10-08 NOTE — Progress Notes (Signed)
   HPI  Patient presents today here with a insect bite.  Patient states he woke up with a skin lesion this morning.  He has no pain or itching. He does not know if he was actually bitten.  He denies fever, chills, sweats, or muscle pains.   PMH: Smoking status noted ROS: Per HPI  Objective: BP (!) 115/58   Pulse 62   Temp (!) 97.3 F (36.3 C) (Oral)   Ht 6\' 2"  (1.88 m)   Wt 228 lb 3.2 oz (103.5 kg)   BMI 29.30 kg/m  Gen: NAD, alert, cooperative with exam HEENT: NCAT CV: RRR, good S1/S2, no murmur Resp: CTABL, no wheezes, non-labored Ext: No edema, warm Neuro: Alert and oriented, No gross deficits  Skin Right lower chest with a 7 mm black circular lesion surrounded by a very thin erythematous ring measuring approximately 1 mm in width surrounded by another less erythematous roughly circular area measuring 4 cm  Assessment and plan:  #Skin lesion Possibly arthropod bite, discussed common spider bites and usual course of illness. Discussed red flags Given how large this lesion is I have covered for infection with Keflex    Meds ordered this encounter  Medications  . cephALEXin (KEFLEX) 500 MG capsule    Sig: Take 1 capsule (500 mg total) 3 (three) times daily by mouth.    Dispense:  21 capsule    Refill:  [place], MD [facility] 10/08/2017, 5:23 PM

## 2017-12-17 ENCOUNTER — Ambulatory Visit (INDEPENDENT_AMBULATORY_CARE_PROVIDER_SITE_OTHER): Payer: Medicare HMO | Admitting: Family Medicine

## 2017-12-17 ENCOUNTER — Other Ambulatory Visit: Payer: Self-pay | Admitting: Family Medicine

## 2017-12-17 ENCOUNTER — Encounter: Payer: Self-pay | Admitting: Internal Medicine

## 2017-12-17 ENCOUNTER — Encounter: Payer: Self-pay | Admitting: Family Medicine

## 2017-12-17 VITALS — BP 120/72 | HR 67 | Temp 97.6°F | Ht 74.0 in | Wt 241.0 lb

## 2017-12-17 DIAGNOSIS — Z86010 Personal history of colonic polyps: Secondary | ICD-10-CM

## 2017-12-17 DIAGNOSIS — M75101 Unspecified rotator cuff tear or rupture of right shoulder, not specified as traumatic: Secondary | ICD-10-CM

## 2017-12-17 MED ORDER — TRIAMCINOLONE ACETONIDE 40 MG/ML IJ SUSP
40.0000 mg | Freq: Once | INTRAMUSCULAR | Status: AC
  Administered 2017-12-17: 40 mg via INTRAMUSCULAR

## 2017-12-17 NOTE — Addendum Note (Signed)
Addended by: Karle Plumber on: 12/17/2017 03:22 PM   Modules accepted: Orders

## 2017-12-17 NOTE — Progress Notes (Signed)
   HPI  Patient presents today here with right shoulder pain.  Patient states that about 3-4 months ago he slipped [place] the golf [place] and fell landing on his right shoulder.  He had mild pain intermittently since that time but over the last few days has had more and more pain.  He describes right-sided shoulder pain with forward flexion while his forearm is flexed in. He denies any other injury. He has mild limitation states that the pain is causing him difficulty to sleep.  He would also like to try physical therapy.  He would also like a referral back for another colonoscopy  PMH: Smoking status noted ROS: Per HPI  Objective: BP 120/72   Pulse 67   Temp 97.6 F (36.4 C) (Oral)   Ht 6\' 2"  (1.88 m)   Wt 241 lb (109.3 kg)   BMI 30.94 kg/m  Gen: NAD, alert, cooperative with exam HEENT: NCAT CV: RRR, good S1/S2, no murmur Resp: CTABL, no wheezes, non-labored Ext: No edema, warm Neuro: Alert and oriented, No gross deficits  MSK:  Full range of motion with bilateral shoulders Positive empty can and mildly positive Hawkins sign No tenderness to palpation of the bony or muscular landmarks of the right shoulder  Right shoulder injection Informed consent obtained and placed in chart.  Time out performed.  Area cleaned with iodine x 2 and wiped clear with alcohol swab.  Using 21 1/2 gauge needle 1 cc Kenalog and 3 cc's 1% Lidocaine were injected in subacromial space via posterior approach.  Sterile bandage placed.  Patient tolerated procedure well.  No complications.     Assessment and plan:  #Rotator cuff syndrome Discussed usual course of illness with patient, subacromial bursal injection placed today as above Physical therapy referral placed  #History of colon polyps Patient due for repeat colonoscopy, referral placed     Orders Placed This Encounter  Procedures  . Ambulatory referral to Gastroenterology    Referral Priority:   Routine    Referral Type:    Consultation    Referral Reason:   Specialty Services Required    Number of Visits Requested:   1  . Ambulatory referral to Physical Therapy    Referral Priority:   Routine    Referral Type:   Physical Medicine    Referral Reason:   Specialty Services Required    Requested Specialty:   Physical Therapy    Number of Visits Requested:   [place], MD [facility] 12/17/2017, 3:06 PM

## 2017-12-23 ENCOUNTER — Encounter: Payer: Self-pay | Admitting: Physical Therapy

## 2017-12-23 ENCOUNTER — Ambulatory Visit: Payer: Medicare HMO | Attending: Family Medicine | Admitting: Physical Therapy

## 2017-12-23 DIAGNOSIS — M25511 Pain in right shoulder: Secondary | ICD-10-CM | POA: Diagnosis present

## 2017-12-23 DIAGNOSIS — M25611 Stiffness of right shoulder, not elsewhere classified: Secondary | ICD-10-CM | POA: Insufficient documentation

## 2017-12-23 NOTE — Therapy (Signed)
[facility] [place], [place], [address] Phone: [phone]   Fax:  [phone]  Physical Therapy Evaluation  Patient Details  Name: Travis Wood MRN: [id number] Date of Birth: June 02, 1951 Referring Provider: Dr. Kenn File   Encounter Date: 12/23/2017  PT End of Session - 12/23/17 1233    Visit Number  1    Number of Visits  9    Date for PT Re-Evaluation  02/20/18    PT Start Time  1115    PT Stop Time  1200    PT Time Calculation (min)  45 min    Activity Tolerance  Patient tolerated treatment well    Behavior During Therapy  [facility] for tasks assessed/performed       Past Medical History:  Diagnosis Date  . Erectile dysfunction   . History of bronchitis   . History of colon polyps   . Hyperlipemia   . Osteoarthritis   . Shortness of breath dyspnea     Past Surgical History:  Procedure Laterality Date  . APPENDECTOMY    . CATARACT EXTRACTION W/PHACO Left 10/15/2015   Procedure: CATARACT EXTRACTION PHACO AND INTRAOCULAR LENS PLACEMENT LEFT EYE;  Surgeon: Tonny Branch, MD;  Location: [facility];  Service: Ophthalmology;  Laterality: Left;  CDE:12.20  . CATARACT EXTRACTION W/PHACO Right 11/12/2015   Procedure: CATARACT EXTRACTION PHACO AND INTRAOCULAR LENS PLACEMENT RIGHT EYE:  CDE:  8.99;  Surgeon: Tonny Branch, MD;  Location: [facility];  Service: Ophthalmology;  Laterality: Right;  . KNEE SURGERY     Left x 2   . MASS EXCISION N/A 09/12/2015   Procedure: EXCISION SOFT TISSUE NEOPLASM (6 CM), BACK;  Surgeon: Aviva Signs, MD;  Location: [facility];  Service: General;  Laterality: N/A;    There were no vitals filed for this visit.   Subjective Assessment - 12/23/17 1134    Subjective  Pt arriving to therapy reporting injury to his left shoulder 3 weeks when reaching to pick up object off the ground. Pt reporting his R shoulder has been "bothering" him for a couple of months but the pain really increased when he stretched  to pick up the object 3 weeks ago. Pt reporting he received a steriod injection on 12/18/17 which has helped. Pt reporting pain with abduction and ER.     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     Limitations  Other (comment);House hold activities    How long can you sit comfortably?  unlimited    How long can you stand comfortably?  unlimited    How long can you walk comfortably?  unlimited    Patient Stated Goals  Return to playing golf without pain    Currently in Pain?  No/denies 8/10 pain with abduction, no pain at rest    Pain Orientation  Right    Pain Descriptors / Indicators  Aching    Pain Type  Acute pain    Pain Onset  1 to 4 weeks ago    Pain Frequency  Constant    Aggravating Factors   abduction, ER    Pain Relieving Factors  resting    Multiple Pain Sites  No         [facility] PT Assessment - 12/23/17 0001      Assessment   Medical Diagnosis  R rotator cuff syndrome    Referring Provider  Dr. Kenn File    Onset Date/Surgical Date  12/02/17 about 3 weeks ago  per pt report    Hand Dominance  Left    Next MD Visit  unsure    Prior Therapy  yes for rotator cuff injury      Precautions   Precautions  None      Restrictions   Weight Bearing Restrictions  No      Balance Screen   Has the patient fallen in the past 6 months  No      Prior Function   Level of Independence  Independent    Vocation  Retired    Leisure  Software engineer   Overall Cognitive Status  Within Functional Limits for tasks assessed      AROM   Overall AROM   Deficits    AROM Assessment Site  Shoulder    Right/Left Shoulder  Right    Right Shoulder Extension  65 Degrees    Right Shoulder Flexion  154 Degrees    Right Shoulder ABduction  80 Degrees    Right Shoulder Internal Rotation  -- to stomach    Right Shoulder External Rotation  58 Degrees      PROM   PROM Assessment Site  Shoulder    Right/Left Shoulder  Right    Right Shoulder  Extension  75 Degrees    Right Shoulder Flexion  175 Degrees    Right Shoulder ABduction  100 Degrees    Right Shoulder External Rotation  65 Degrees      Strength   Strength Assessment Site  -- mildly limited by pain with abd and ER      Special Tests    Special Tests  Rotator Cuff Impingement    Rotator Cuff Impingment tests  Michel Bickers test;Empty Can test      Hawkins-Kennedy test   Findings  Positive    Side  Right      Empty Can test   Findings  Positive    Side  Right             Objective measurements completed on examination: See above findings.      [facility] PT Treatment/Exercise - 12/23/17 0001      Exercises   Exercises  Shoulder      Shoulder Exercises: Supine   External Rotation  AAROM;10 reps    Flexion  AAROM;10 reps      Shoulder Exercises: Isometric Strengthening   Flexion  5X5"    Extension  5X5"    External Rotation  5X5"    Internal Rotation  5X5"             PT Education - 12/23/17 1232    Education provided  Yes    Education Details  Shoulder HEP    Person(s) Educated  Patient    Methods  Explanation;Handout;Demonstration;Verbal cues    Comprehension  Returned demonstration;Verbalized understanding       PT Short Term Goals - 03/04/17 0945      PT SHORT TERM GOAL #1   Title  Independent with HEP    Time  3    Period  Weeks    Status  Achieved        PT Long Term Goals - 12/23/17 1241      PT LONG TERM GOAL #1   Title  Ind with an advanced HEP.    Time  4    Period  Weeks    Status  New    Target Date  01/22/18  PT LONG TERM GOAL #2   Title  Return to golfing with pain </= 2/10.     Baseline  at evaluation pt unable to play golf due to pain increasing to 8/10.     Time  4    Period  Weeks    Status  New    Target Date  01/22/18      PT LONG TERM GOAL #3   Title  Pt will be able to improve R shoulder flexion to >/= 170 degrees actively in order to improve pt's functional mobility.      Baseline  12/2317: 154 degrees actively    Time  4    Period  Weeks    Status  New    Target Date  01/22/18      PT LONG TERM GOAL #4   Title  Pt will improve R shoulder ER to >/= 75 degrees in order to improve functional mobility and return to golf.     Baseline  ER on 12/23/17 = 58 degrees actively    Time  4    Period  Weeks    Status  New    Target Date  01/22/18             Plan - 12/23/17 1234    Clinical Impression Statement  Pt arriving to therapy reporting 3 week history of shoulder injury when reaching to pick up object off the ground. Pt reporting mild discomfort prior to injury but pt was still able to play golf. Pt has been unable to play golf for 3 weeks due to pain and decreased ROM. Pt s/p steroid injection in R shoulder on Friday 12/18/17. Pt reporting pain relief since injection and only reports pain with abduction and ER. Pt with weakness in R shoulder due to pain limitations. Pt with positive Empty Can test and Hawkins-Kennedy in R UE. Pt's goal is to return to playing golf without pain. Skilled PT needed to address pt's impairments with the below interventions.     Clinical Presentation  Stable    Clinical Decision Making  Low    Rehab Potential  Excellent    PT Frequency  2x / week    PT Duration  4 weeks    PT Treatment/Interventions  ADLs/Self Care Home Management;Cryotherapy;Electrical Stimulation;Iontophoresis 4mg /ml Dexamethasone;Functional mobility training;Therapeutic activities;Therapeutic exercise;Patient/family education;Passive range of motion;Manual techniques;Dry needling;Taping    PT Next Visit Plan  Shoulder isometrics, Shoulder ROM, gentle strengthening, mobilization, modalities as needed.     PT Home Exercise Plan  shoulder flexion with cane, ER with cane, 4 way isometrics    Consulted and Agree with Plan of Care  Patient       Patient will benefit from skilled therapeutic intervention in order to improve the following deficits and impairments:   Pain, Decreased range of motion, Decreased strength  Visit Diagnosis: Acute pain of right shoulder  Stiffness of right shoulder, not elsewhere classified     Problem List Patient Active Problem List   Diagnosis Date Noted  . Eustachian tube dysfunction 12/14/2015  . Hyperlipidemia 06/13/2015    Oretha Caprice, PT 12/23/2017, 12:47 PM  [facility] [place], [place], [address] Phone: [phone]   Fax:  [phone]  Name: SHANARD TRETO MRN: [id number] Date of Birth: [birth year]

## 2017-12-29 ENCOUNTER — Ambulatory Visit: Payer: Medicare HMO | Admitting: Physical Therapy

## 2017-12-29 ENCOUNTER — Encounter: Payer: Self-pay | Admitting: Physical Therapy

## 2017-12-29 DIAGNOSIS — M25611 Stiffness of right shoulder, not elsewhere classified: Secondary | ICD-10-CM

## 2017-12-29 DIAGNOSIS — M25511 Pain in right shoulder: Secondary | ICD-10-CM

## 2017-12-29 NOTE — Therapy (Signed)
[facility] [place], [place], [address] Phone: [phone]   Fax:  [phone]  Physical Therapy Treatment  Patient Details  Name: Travis Wood MRN: [id number] Date of Birth: [birth year] Referring Provider: Dr. Kenn File   Encounter Date: 12/29/2017  PT End of Session - 12/29/17 1124    Visit Number  2    Number of Visits  9    Date for PT Re-Evaluation  02/20/18    PT Start Time  1118    PT Stop Time  1207    PT Time Calculation (min)  49 min    Activity Tolerance  Patient tolerated treatment well    Behavior During Therapy  [facility] for tasks assessed/performed       Past Medical History:  Diagnosis Date  . Erectile dysfunction   . History of bronchitis   . History of colon polyps   . Hyperlipemia   . Osteoarthritis   . Shortness of breath dyspnea     Past Surgical History:  Procedure Laterality Date  . APPENDECTOMY    . CATARACT EXTRACTION W/PHACO Left 10/15/2015   Procedure: CATARACT EXTRACTION PHACO AND INTRAOCULAR LENS PLACEMENT LEFT EYE;  Surgeon: Tonny Branch, MD;  Location: [facility];  Service: Ophthalmology;  Laterality: Left;  CDE:12.20  . CATARACT EXTRACTION W/PHACO Right 11/12/2015   Procedure: CATARACT EXTRACTION PHACO AND INTRAOCULAR LENS PLACEMENT RIGHT EYE:  CDE:  8.99;  Surgeon: Tonny Branch, MD;  Location: [facility];  Service: Ophthalmology;  Laterality: Right;  . KNEE SURGERY     Left x 2   . MASS EXCISION N/A 09/12/2015   Procedure: EXCISION SOFT TISSUE NEOPLASM (6 CM), BACK;  Surgeon: Aviva Signs, MD;  Location: [facility];  Service: General;  Laterality: N/A;    There were no vitals filed for this visit.  Subjective Assessment - 12/29/17 1123    Subjective  Reports continued pain into abduction.    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     Limitations  Other (comment);House hold activities    How long can you sit comfortably?  unlimited    How long can you stand  comfortably?  unlimited    How long can you walk comfortably?  unlimited    Patient Stated Goals  Return to playing golf without pain    Currently in Pain?  No/denies         [facility] PT Assessment - 12/29/17 0001      Assessment   Medical Diagnosis  R rotator cuff syndrome    Onset Date/Surgical Date  12/02/17    Hand Dominance  Left    Next MD Visit  unsure    Prior Therapy  yes for rotator cuff injury      Precautions   Precautions  None      Restrictions   Weight Bearing Restrictions  No                  [facility] Adult PT Treatment/Exercise - 12/29/17 0001      Shoulder Exercises: Supine   External Rotation  AAROM;Right;15 reps    Flexion  AAROM;Both;15 reps      Shoulder Exercises: Standing   Other Standing Exercises  RUE ranger into flex, small circles x20 reps      Shoulder Exercises: ROM/Strengthening   UBE (Upper Arm Bike)  120 RPM x6 min       Shoulder Exercises: Isometric Strengthening   Flexion  5X10"  Extension  5X10"    External Rotation  5X10"    Internal Rotation  5X10"      Shoulder Exercises: Stretch   Corner Stretch  4 reps;20 seconds      Modalities   Modalities  Electrical Stimulation;Iontophoresis;Vasopneumatic      Water quality scientist Parameters  80-150 hz x15 min    Electrical Stimulation Goals  Pain      Iontophoresis   Type of Iontophoresis  Dexamethasone    Location  R lateral shoulder    Dose  1 ml    Time  8      Vasopneumatic   Number Minutes Vasopneumatic   15 minutes    Vasopnuematic Location   Shoulder    Vasopneumatic Pressure  Medium    Vasopneumatic Temperature   34             PT Education - 12/29/17 1159    Education provided  Yes    Education Details  [place] education handout    Person(s) Educated  Patient    Methods  Explanation;Handout    Comprehension  Verbalized understanding           PT Long Term Goals - 12/23/17 1241      PT LONG TERM GOAL #1   Title  Ind with an advanced HEP.    Time  4    Period  Weeks    Status  New    Target Date  01/22/18      PT LONG TERM GOAL #2   Title  Return to golfing with pain </= 2/10.     Baseline  at evaluation pt unable to play golf due to pain increasing to 8/10.     Time  4    Period  Weeks    Status  New    Target Date  01/22/18      PT LONG TERM GOAL #3   Title  Pt will be able to improve R shoulder flexion to >/= 170 degrees actively in order to improve pt's functional mobility.     Baseline  12/2317: 154 degrees actively    Time  4    Period  Weeks    Status  New    Target Date  01/22/18      PT LONG TERM GOAL #4   Title  Pt will improve R shoulder ER to >/= 75 degrees in order to improve functional mobility and return to golf.     Baseline  ER on 12/23/17 = 58 degrees actively    Time  4    Period  Weeks    Status  New    Target Date  01/22/18            Plan - 12/29/17 1157    Clinical Impression Statement  Patient continues to present in clinic with reports of increased pain in R shoulder with abduction such as during golf swing. Patient guided through light strengthening and stretching exercises as well as ROM. Patient did experience stretchi with corner stretch today and was able to experience difficulty with isometric into ER. Patient educated regarding the iontophoresis and was provided handout for further education. Normal modalities response noted following removal of the modalities.    Rehab Potential  Excellent    PT Frequency  2x / week    PT Duration  4 weeks  PT Treatment/Interventions  ADLs/Self Care Home Management;Cryotherapy;Electrical Stimulation;Iontophoresis 4mg /ml Dexamethasone;Functional mobility training;Therapeutic activities;Therapeutic exercise;Patient/family education;Passive range of motion;Manual techniques;Dry needling;Taping    PT Next Visit Plan  Shoulder  isometrics, Shoulder ROM, gentle strengthening, mobilization, modalities as needed.     PT Home Exercise Plan  shoulder flexion with cane, ER with cane, 4 way isometrics    Consulted and Agree with Plan of Care  Patient       Patient will benefit from skilled therapeutic intervention in order to improve the following deficits and impairments:  Pain, Decreased range of motion, Decreased strength  Visit Diagnosis: Acute pain of right shoulder  Stiffness of right shoulder, not elsewhere classified     Problem List Patient Active Problem List   Diagnosis Date Noted  . Eustachian tube dysfunction 12/14/2015  . Hyperlipidemia 06/13/2015    Standley Brooking, PTA 12/29/2017, 12:12 PM  [facility] [place], [place], [address] Phone: [phone]   Fax:  [phone]  Name: RUFFUS KAMAKA MRN: [id number] Date of Birth: [birth year]

## 2017-12-29 NOTE — Patient Instructions (Signed)

## 2017-12-31 ENCOUNTER — Ambulatory Visit: Payer: Medicare HMO | Admitting: Physical Therapy

## 2017-12-31 ENCOUNTER — Encounter: Payer: Self-pay | Admitting: Physical Therapy

## 2017-12-31 DIAGNOSIS — M25511 Pain in right shoulder: Secondary | ICD-10-CM | POA: Diagnosis not present

## 2017-12-31 DIAGNOSIS — M25611 Stiffness of right shoulder, not elsewhere classified: Secondary | ICD-10-CM

## 2017-12-31 NOTE — Therapy (Signed)
[facility] [place], [place], [address] Phone: [phone]   Fax:  [phone]  Physical Therapy Treatment  Patient Details  Name: Travis Wood MRN: [id number] Date of Birth: 03/07/1951 Referring Provider: Dr. Kenn File   Encounter Date: 12/31/2017  PT End of Session - 12/31/17 1121    Visit Number  3    Number of Visits  9    Date for PT Re-Evaluation  02/20/18    PT Start Time  1117    PT Stop Time  1207    PT Time Calculation (min)  50 min    Activity Tolerance  Patient tolerated treatment well    Behavior During Therapy  [facility] for tasks assessed/performed       Past Medical History:  Diagnosis Date  . Erectile dysfunction   . History of bronchitis   . History of colon polyps   . Hyperlipemia   . Osteoarthritis   . Shortness of breath dyspnea     Past Surgical History:  Procedure Laterality Date  . APPENDECTOMY    . CATARACT EXTRACTION W/PHACO Left 10/15/2015   Procedure: CATARACT EXTRACTION PHACO AND INTRAOCULAR LENS PLACEMENT LEFT EYE;  Surgeon: Tonny Branch, MD;  Location: [facility];  Service: Ophthalmology;  Laterality: Left;  CDE:12.20  . CATARACT EXTRACTION W/PHACO Right 11/12/2015   Procedure: CATARACT EXTRACTION PHACO AND INTRAOCULAR LENS PLACEMENT RIGHT EYE:  CDE:  8.99;  Surgeon: Tonny Branch, MD;  Location: [facility];  Service: Ophthalmology;  Laterality: Right;  . KNEE SURGERY     Left x 2   . MASS EXCISION N/A 09/12/2015   Procedure: EXCISION SOFT TISSUE NEOPLASM (6 CM), BACK;  Surgeon: Aviva Signs, MD;  Location: [facility];  Service: General;  Laterality: N/A;    There were no vitals filed for this visit.  Subjective Assessment - 12/31/17 1119    Subjective  Reports tightness of the R shoulder into abduction but said he thought pain patch better.    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     Limitations  Other (comment);House hold activities    How long can you sit  comfortably?  unlimited    How long can you stand comfortably?  unlimited    How long can you walk comfortably?  unlimited    Patient Stated Goals  Return to playing golf without pain    Currently in Pain?  Yes    Pain Score  7     Pain Location  Shoulder    Pain Orientation  Right;Lateral    Pain Descriptors / Indicators  Discomfort    Pain Type  Acute pain    Pain Onset  1 to 4 weeks ago    Pain Frequency  Intermittent    Aggravating Factors   Abduction, IR to reach behind         [facility] PT Assessment - 12/31/17 0001      Assessment   Medical Diagnosis  R rotator cuff syndrome    Onset Date/Surgical Date  12/02/17    Hand Dominance  Left    Next MD Visit  unsure    Prior Therapy  yes for rotator cuff injury      Precautions   Precautions  None      Restrictions   Weight Bearing Restrictions  No                  [facility] Adult PT Treatment/Exercise - 12/31/17 0001  Shoulder Exercises: Standing   Other Standing Exercises  RUE ranger into flex, small circles x20 reps      Shoulder Exercises: ROM/Strengthening   UBE (Upper Arm Bike)  90 RPM x6 min       Shoulder Exercises: Isometric Strengthening   Flexion  5X10"    Extension  5X10"    External Rotation  5X10"    Internal Rotation  5X10"      Shoulder Exercises: Stretch   Corner Stretch  3 reps;30 seconds      Modalities   Modalities  Electrical Stimulation;Cryotherapy;Iontophoresis      Cryotherapy   Number Minutes Cryotherapy  15 Minutes    Cryotherapy Location  Shoulder    Type of Cryotherapy  Ice pack      Electrical Stimulation   Electrical Stimulation Location  R shoulder    Electrical Stimulation Action  Pre-Mod    Electrical Stimulation Parameters  80-150 hz x15 min    Electrical Stimulation Goals  Pain;Tone      Iontophoresis   Type of Iontophoresis  Dexamethasone    Location  R lateral shoulder    Dose  1 ml    Time  8      Manual Therapy   Manual Therapy  Myofascial release     Myofascial Release  IASTW to R deltoids to reduce muscle tightness                  PT Long Term Goals - 12/23/17 1241      PT LONG TERM GOAL #1   Title  Ind with an advanced HEP.    Time  4    Period  Weeks    Status  New    Target Date  01/22/18      PT LONG TERM GOAL #2   Title  Return to golfing with pain </= 2/10.     Baseline  at evaluation pt unable to play golf due to pain increasing to 8/10.     Time  4    Period  Weeks    Status  New    Target Date  01/22/18      PT LONG TERM GOAL #3   Title  Pt will be able to improve R shoulder flexion to >/= 170 degrees actively in order to improve pt's functional mobility.     Baseline  12/2317: 154 degrees actively    Time  4    Period  Weeks    Status  New    Target Date  01/22/18      PT LONG TERM GOAL #4   Title  Pt will improve R shoulder ER to >/= 75 degrees in order to improve functional mobility and return to golf.     Baseline  ER on 12/23/17 = 58 degrees actively    Time  4    Period  Weeks    Status  New    Target Date  01/22/18            Plan - 12/31/17 1209    Clinical Impression Statement  Patient continues to present in clinic with continued pain with R shoulder abduction and IR behind his back. Patient guided through low level strengthening and ROM without complaint. Increased muscle tone palpated along R deltoids and IASTW completed with good redness response to R deltoids. Normal modalities response noted following removal of the modalities. Iontophoresis patch placed over R lateral shoulder with patient educated to remove in 4  hours.    Rehab Potential  Excellent    PT Frequency  2x / week    PT Duration  4 weeks    PT Treatment/Interventions  ADLs/Self Care Home Management;Cryotherapy;Electrical Stimulation;Iontophoresis 4mg /ml Dexamethasone;Functional mobility training;Therapeutic activities;Therapeutic exercise;Patient/family education;Passive range of motion;Manual techniques;Dry  needling;Taping    PT Next Visit Plan  Shoulder isometrics, Shoulder ROM, gentle strengthening, mobilization, modalities as needed.     PT Home Exercise Plan  shoulder flexion with cane, ER with cane, 4 way isometrics    Consulted and Agree with Plan of Care  Patient       Patient will benefit from skilled therapeutic intervention in order to improve the following deficits and impairments:  Pain, Decreased range of motion, Decreased strength  Visit Diagnosis: Acute pain of right shoulder  Stiffness of right shoulder, not elsewhere classified     Problem List Patient Active Problem List   Diagnosis Date Noted  . Eustachian tube dysfunction 12/14/2015  . Hyperlipidemia 06/13/2015    Standley Brooking, PTA 12/31/2017, 12:13 PM  [facility] [address] [place], [place], [address] Phone: [phone]   Fax:  [phone]  Name: Travis Wood MRN: [id number] Date of Birth: 28-Jun-1951

## 2018-01-04 ENCOUNTER — Ambulatory Visit: Payer: Medicare HMO | Attending: Family Medicine | Admitting: Physical Therapy

## 2018-01-04 ENCOUNTER — Encounter: Payer: Self-pay | Admitting: Physical Therapy

## 2018-01-04 DIAGNOSIS — M25611 Stiffness of right shoulder, not elsewhere classified: Secondary | ICD-10-CM | POA: Diagnosis present

## 2018-01-04 DIAGNOSIS — R262 Difficulty in walking, not elsewhere classified: Secondary | ICD-10-CM | POA: Diagnosis present

## 2018-01-04 DIAGNOSIS — M25511 Pain in right shoulder: Secondary | ICD-10-CM | POA: Diagnosis not present

## 2018-01-04 DIAGNOSIS — M25561 Pain in right knee: Secondary | ICD-10-CM | POA: Insufficient documentation

## 2018-01-04 DIAGNOSIS — M25661 Stiffness of right knee, not elsewhere classified: Secondary | ICD-10-CM | POA: Insufficient documentation

## 2018-01-04 NOTE — Therapy (Signed)
[facility] [place], [place], [address] Phone: [phone]   Fax:  [phone]  Physical Therapy Treatment  Patient Details  Name: Travis Wood MRN: [id number] Date of Birth: 1951/06/18 Referring Provider: Dr. Kenn File   Encounter Date: 01/04/2018  PT End of Session - 01/04/18 1302    Visit Number  4    Number of Visits  9    Date for PT Re-Evaluation  02/20/18    PT Start Time  1300    PT Stop Time  1345    PT Time Calculation (min)  45 min    Activity Tolerance  Patient tolerated treatment well    Behavior During Therapy  [facility] for tasks assessed/performed       Past Medical History:  Diagnosis Date  . Erectile dysfunction   . History of bronchitis   . History of colon polyps   . Hyperlipemia   . Osteoarthritis   . Shortness of breath dyspnea     Past Surgical History:  Procedure Laterality Date  . APPENDECTOMY    . CATARACT EXTRACTION W/PHACO Left 10/15/2015   Procedure: CATARACT EXTRACTION PHACO AND INTRAOCULAR LENS PLACEMENT LEFT EYE;  Surgeon: Tonny Branch, MD;  Location: [facility];  Service: Ophthalmology;  Laterality: Left;  CDE:12.20  . CATARACT EXTRACTION W/PHACO Right 11/12/2015   Procedure: CATARACT EXTRACTION PHACO AND INTRAOCULAR LENS PLACEMENT RIGHT EYE:  CDE:  8.99;  Surgeon: Tonny Branch, MD;  Location: [facility];  Service: Ophthalmology;  Laterality: Right;  . KNEE SURGERY     Left x 2   . MASS EXCISION N/A 09/12/2015   Procedure: EXCISION SOFT TISSUE NEOPLASM (6 CM), BACK;  Surgeon: Aviva Signs, MD;  Location: [facility];  Service: General;  Laterality: N/A;    There were no vitals filed for this visit.  Subjective Assessment - 01/04/18 1300    Subjective  Reports he is seeing a little improvement but still having pain reaching back and out.    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     Limitations  Other (comment);House hold activities    How long can you sit  comfortably?  unlimited    How long can you stand comfortably?  unlimited    How long can you walk comfortably?  unlimited    Patient Stated Goals  Return to playing golf without pain    Currently in Pain?  Yes    Pain Score  5     Pain Location  Shoulder    Pain Orientation  Right    Pain Descriptors / Indicators  Discomfort    Pain Type  Acute pain    Pain Onset  1 to 4 weeks ago         [facility] PT Assessment - 01/04/18 0001      Assessment   Medical Diagnosis  R rotator cuff syndrome    Onset Date/Surgical Date  12/02/17    Hand Dominance  Left    Next MD Visit  unsure    Prior Therapy  yes for rotator cuff injury      Precautions   Precautions  None      Restrictions   Weight Bearing Restrictions  No                  [facility] Adult PT Treatment/Exercise - 01/04/18 0001      Shoulder Exercises: Standing   Extension  AAROM;Both;15 reps    Other Standing Exercises  RUE ranger into flex, small circles x20 reps    Other Standing Exercises  R D2 AROM x15 reps within comfortable range      Shoulder Exercises: ROM/Strengthening   UBE (Upper Arm Bike)  60 RPM x6 min (  3 min forward, 3 min backward)      Shoulder Exercises: Isometric Strengthening   Flexion  5X10"    Extension  5X10"    External Rotation  5X10"    Internal Rotation  5X10"      Shoulder Exercises: Stretch   Corner Stretch  3 reps;30 seconds      Modalities   Modalities  Electrical Stimulation;Cryotherapy;Iontophoresis      Cryotherapy   Number Minutes Cryotherapy  15 Minutes    Cryotherapy Location  Shoulder    Type of Cryotherapy  Ice pack      Electrical Stimulation   Electrical Stimulation Location  R shoulder    Electrical Stimulation Action  Pre-Mod    Electrical Stimulation Parameters  80-150 hz x15 min    Electrical Stimulation Goals  Pain;Tone      Iontophoresis   Type of Iontophoresis  Dexamethasone    Location  R lateral shoulder    Dose  1 ml    Time  8      Manual  Therapy   Manual Therapy  Myofascial release    Myofascial Release  IASTW to R deltoids to reduce muscle tightness                  PT Long Term Goals - 12/23/17 1241      PT LONG TERM GOAL #1   Title  Ind with an advanced HEP.    Time  4    Period  Weeks    Status  New    Target Date  01/22/18      PT LONG TERM GOAL #2   Title  Return to golfing with pain </= 2/10.     Baseline  at evaluation pt unable to play golf due to pain increasing to 8/10.     Time  4    Period  Weeks    Status  New    Target Date  01/22/18      PT LONG TERM GOAL #3   Title  Pt will be able to improve R shoulder flexion to >/= 170 degrees actively in order to improve pt's functional mobility.     Baseline  12/2317: 154 degrees actively    Time  4    Period  Weeks    Status  New    Target Date  01/22/18      PT LONG TERM GOAL #4   Title  Pt will improve R shoulder ER to >/= 75 degrees in order to improve functional mobility and return to golf.     Baseline  ER on 12/23/17 = 58 degrees actively    Time  4    Period  Weeks    Status  New    Target Date  01/22/18            Plan - 01/04/18 1331    Clinical Impression Statement  Patient tolerated today's treatment and did well with all exercises although he was cued to stop just short of end range with R shoulder D2 as facial grimacing noted at end range. Patient reports completing exercises from PT at home as well as he is very motivated to return to his hobbies. IASTW continued to  R deltoids with continued good redness response and reduced muscle tightness. Normal modalities response noted following removal of the modalities.    Rehab Potential  Excellent    PT Frequency  2x / week    PT Duration  4 weeks    PT Treatment/Interventions  ADLs/Self Care Home Management;Cryotherapy;Electrical Stimulation;Iontophoresis 4mg /ml Dexamethasone;Functional mobility training;Therapeutic activities;Therapeutic exercise;Patient/family  education;Passive range of motion;Manual techniques;Dry needling;Taping    PT Next Visit Plan  Shoulder isometrics, Shoulder ROM, gentle strengthening, mobilization, modalities as needed.     PT Home Exercise Plan  shoulder flexion with cane, ER with cane, 4 way isometrics    Consulted and Agree with Plan of Care  Patient       Patient will benefit from skilled therapeutic intervention in order to improve the following deficits and impairments:  Pain, Decreased range of motion, Decreased strength  Visit Diagnosis: Acute pain of right shoulder  Stiffness of right shoulder, not elsewhere classified     Problem List Patient Active Problem List   Diagnosis Date Noted  . Eustachian tube dysfunction 12/14/2015  . Hyperlipidemia 06/13/2015    Standley Brooking, PTA 01/04/2018, 1:49 PM  [facility] [address] [place], [place], [address] Phone: [phone]   Fax:  [phone]  Name: Travis Wood MRN: [id number] Date of Birth: [birth year]

## 2018-01-08 ENCOUNTER — Ambulatory Visit: Payer: Medicare HMO | Admitting: Physical Therapy

## 2018-01-08 ENCOUNTER — Encounter: Payer: Self-pay | Admitting: Physical Therapy

## 2018-01-08 DIAGNOSIS — M25561 Pain in right knee: Secondary | ICD-10-CM

## 2018-01-08 DIAGNOSIS — M25511 Pain in right shoulder: Secondary | ICD-10-CM

## 2018-01-08 DIAGNOSIS — M25661 Stiffness of right knee, not elsewhere classified: Secondary | ICD-10-CM | POA: Diagnosis not present

## 2018-01-08 DIAGNOSIS — M25611 Stiffness of right shoulder, not elsewhere classified: Secondary | ICD-10-CM | POA: Diagnosis not present

## 2018-01-08 DIAGNOSIS — R262 Difficulty in walking, not elsewhere classified: Secondary | ICD-10-CM | POA: Diagnosis not present

## 2018-01-08 NOTE — Therapy (Signed)
[facility] [place], [place], [address] Phone: [phone]   Fax:  [phone]  Physical Therapy Treatment  Patient Details  Name: Travis Wood MRN: [id number] Date of Birth: 16-Feb-1951 Referring Provider: Dr. Kenn File   Encounter Date: 01/08/2018  PT End of Session - 01/08/18 1206    Visit Number  5    Number of Visits  9    Date for PT Re-Evaluation  02/20/18    PT Start Time  1115    PT Stop Time  1150    PT Time Calculation (min)  35 min    Activity Tolerance  Patient tolerated treatment well    Behavior During Therapy  [facility] for tasks assessed/performed       Past Medical History:  Diagnosis Date  . Erectile dysfunction   . History of bronchitis   . History of colon polyps   . Hyperlipemia   . Osteoarthritis   . Shortness of breath dyspnea     Past Surgical History:  Procedure Laterality Date  . APPENDECTOMY    . CATARACT EXTRACTION W/PHACO Left 10/15/2015   Procedure: CATARACT EXTRACTION PHACO AND INTRAOCULAR LENS PLACEMENT LEFT EYE;  Surgeon: Tonny Branch, MD;  Location: [facility];  Service: Ophthalmology;  Laterality: Left;  CDE:12.20  . CATARACT EXTRACTION W/PHACO Right 11/12/2015   Procedure: CATARACT EXTRACTION PHACO AND INTRAOCULAR LENS PLACEMENT RIGHT EYE:  CDE:  8.99;  Surgeon: Tonny Branch, MD;  Location: [facility];  Service: Ophthalmology;  Laterality: Right;  . KNEE SURGERY     Left x 2   . MASS EXCISION N/A 09/12/2015   Procedure: EXCISION SOFT TISSUE NEOPLASM (6 CM), BACK;  Surgeon: Aviva Signs, MD;  Location: [facility];  Service: General;  Laterality: N/A;    There were no vitals filed for this visit.  Subjective Assessment - 01/08/18 1212    Subjective  Pt reported he can tell a difference in his movments since last session. Pt reporting he has been working on his HEP.     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     Limitations  Other (comment);House hold  activities    How long can you sit comfortably?  unlimited    How long can you stand comfortably?  unlimited    How long can you walk comfortably?  unlimited    Patient Stated Goals  Return to playing golf without pain    Currently in Pain?  Yes    Pain Score  3     Pain Location  Shoulder    Pain Orientation  Right    Pain Descriptors / Indicators  Discomfort    Pain Type  Acute pain    Pain Onset  More than a month ago    Pain Frequency  Intermittent    Aggravating Factors   Abduction, IR to reach behind his back    Pain Relieving Factors  resting    Multiple Pain Sites  No         [facility] PT Assessment - 01/08/18 0001      Assessment   Medical Diagnosis  R rotator cuff syndrome    Onset Date/Surgical Date  12/02/17    Hand Dominance  Left    Next MD Visit  unsure    Prior Therapy  yes for rotator cuff injury      Precautions   Precautions  None      Restrictions   Weight Bearing Restrictions  No  AROM   Right Shoulder Flexion  170 Degrees    Right Shoulder ABduction  145 Degrees 70 degrees with elbow flexed, 90 degrees following joint mob                  [facility] Adult PT Treatment/Exercise - 01/08/18 0001      Shoulder Exercises: Standing   Other Standing Exercises  RUE ranger into flex, small circles x20 reps    Other Standing Exercises  R D2 AROM x15 reps within comfortable range      Shoulder Exercises: ROM/Strengthening   UBE (Upper Arm Bike)  60 RPM x6 min (  3 min forward, 3 min backward)      Shoulder Exercises: Isometric Strengthening   Flexion  5X10"    Extension  5X10"    External Rotation  5X10"    Internal Rotation  5X10"      Shoulder Exercises: Stretch   Corner Stretch  3 reps;30 seconds      Iontophoresis   Type of Iontophoresis  -- no dexamethasone to apply Ionto today      Manual Therapy   Manual Therapy  Joint mobilization;Soft tissue mobilization    Manual therapy comments  STW to pectoralis major and infraspinatus     Joint Mobilization  grade 3-4 joint mobilization of R shoulder                  PT Long Term Goals - 01/08/18 1209      PT LONG TERM GOAL #1   Title  Ind with an advanced HEP.    Status  On-going      PT LONG TERM GOAL #2   Title  Return to golfing with pain </= 2/10.     Baseline  at evaluation pt unable to play golf due to pain increasing to 8/10.     Status  On-going      PT LONG TERM GOAL #3   Title  Pt will be able to improve R shoulder flexion to >/= 170 degrees actively in order to improve pt's functional mobility.     Baseline  12/2317: 154 degrees actively    Status  On-going      PT LONG TERM GOAL #4   Title  Pt will improve R shoulder ER to >/= 75 degrees in order to improve functional mobility and return to golf.     Baseline  ER on 12/23/17 = 58 degrees actively    Status  On-going      PT LONG TERM GOAL #5   Title  ----------------------------------------------------------------------------------------    Baseline  --------------------------------------------------            Plan - 01/08/18 1207    Clinical Impression Statement  Pt arriving to therapy tolerating all exercises well. Pt reporting improvements in R shoulder abduction. Pt with 70 degrees AROM in shoulder abduction with elbow flexed. Following shoulder mobilizations pt's AROM of Right shoulder abdcution increased to 90 degrees. Continue with skilled PT to progress toward pt's goals.     Rehab Potential  Excellent    PT Frequency  2x / week    PT Duration  4 weeks    PT Treatment/Interventions  ADLs/Self Care Home Management;Cryotherapy;Electrical Stimulation;Iontophoresis 4mg /ml Dexamethasone;Functional mobility training;Therapeutic activities;Therapeutic exercise;Patient/family education;Passive range of motion;Manual techniques;Dry needling;Taping    PT Next Visit Plan  Shoulder isometrics, Shoulder ROM, gentle strengthening, mobilization, modalities as needed.     PT Home Exercise Plan   shoulder flexion with cane, ER  with cane, 4 way isometrics    Consulted and Agree with Plan of Care  Patient       Patient will benefit from skilled therapeutic intervention in order to improve the following deficits and impairments:  Pain, Decreased range of motion, Decreased strength  Visit Diagnosis: Acute pain of right shoulder  Stiffness of right shoulder, not elsewhere classified  Acute pain of right knee     Problem List Patient Active Problem List   Diagnosis Date Noted  . Eustachian tube dysfunction 12/14/2015  . Hyperlipidemia 06/13/2015    Oretha Caprice, MPT 01/08/2018, 12:20 PM  [facility] [place], [place], [address] Phone: [phone]   Fax:  [phone]  Name: Travis Wood MRN: [id number] Date of Birth: [birth year]

## 2018-01-13 ENCOUNTER — Ambulatory Visit: Payer: Medicare HMO | Admitting: *Deleted

## 2018-01-13 DIAGNOSIS — M25511 Pain in right shoulder: Secondary | ICD-10-CM | POA: Diagnosis not present

## 2018-01-13 DIAGNOSIS — R262 Difficulty in walking, not elsewhere classified: Secondary | ICD-10-CM | POA: Diagnosis not present

## 2018-01-13 DIAGNOSIS — M25561 Pain in right knee: Secondary | ICD-10-CM | POA: Diagnosis not present

## 2018-01-13 DIAGNOSIS — M25611 Stiffness of right shoulder, not elsewhere classified: Secondary | ICD-10-CM

## 2018-01-13 DIAGNOSIS — M25661 Stiffness of right knee, not elsewhere classified: Secondary | ICD-10-CM

## 2018-01-13 NOTE — Therapy (Signed)
[facility] [place], [place], [address] Phone: [phone]   Fax:  [phone]  Physical Therapy Treatment  Patient Details  Name: Travis Wood MRN: [id number] Date of Birth: 1951/10/06 Referring Provider: Dr. Kenn File   Encounter Date: 01/13/2018  PT End of Session - 01/13/18 1122    Visit Number  6    Number of Visits  9    Date for PT Re-Evaluation  02/20/18    PT Start Time  1115    PT Stop Time  1214    PT Time Calculation (min)  59 min       Past Medical History:  Diagnosis Date  . Erectile dysfunction   . History of bronchitis   . History of colon polyps   . Hyperlipemia   . Osteoarthritis   . Shortness of breath dyspnea     Past Surgical History:  Procedure Laterality Date  . APPENDECTOMY    . CATARACT EXTRACTION W/PHACO Left 10/15/2015   Procedure: CATARACT EXTRACTION PHACO AND INTRAOCULAR LENS PLACEMENT LEFT EYE;  Surgeon: Tonny Branch, MD;  Location: [facility];  Service: Ophthalmology;  Laterality: Left;  CDE:12.20  . CATARACT EXTRACTION W/PHACO Right 11/12/2015   Procedure: CATARACT EXTRACTION PHACO AND INTRAOCULAR LENS PLACEMENT RIGHT EYE:  CDE:  8.99;  Surgeon: Tonny Branch, MD;  Location: [facility];  Service: Ophthalmology;  Laterality: Right;  . KNEE SURGERY     Left x 2   . MASS EXCISION N/A 09/12/2015   Procedure: EXCISION SOFT TISSUE NEOPLASM (6 CM), BACK;  Surgeon: Aviva Signs, MD;  Location: [facility];  Service: General;  Laterality: N/A;    There were no vitals filed for this visit.                   [facility] Adult PT Treatment/Exercise - 01/13/18 0001      Shoulder Exercises: Standing   External Rotation  Strengthening;Right;Theraband 3x10    Theraband Level (Shoulder External Rotation)  Level 1 (Yellow)    Other Standing Exercises  RUE ranger into flex, small circles x20 reps and diagonals x20      Shoulder Exercises: ROM/Strengthening   UBE (Upper Arm Bike)  60 RPM x6  min (  3 min forward, 3 min backward)      Shoulder Exercises: Isometric Strengthening   Flexion  5X10"    Extension  5X10"    External Rotation  5X10"    Internal Rotation  5X10"      Shoulder Exercises: Stretch   Corner Stretch  3 reps;30 seconds      Modalities   Modalities  Electrical Stimulation;Cryotherapy;Iontophoresis      Electrical Stimulation   Electrical Stimulation Location  R shoulder IFC x15 mins 80-150hz     Electrical Stimulation Goals  Pain;Tone      Iontophoresis   Type of Iontophoresis  Dexamethasone    Location  R lateral shoulder 4/6    Dose  1 ml    Time  8      Vasopneumatic   Number Minutes Vasopneumatic   15 minutes    Vasopnuematic Location   Shoulder    Vasopneumatic Pressure  Medium    Vasopneumatic Temperature   34      Manual Therapy   Manual Therapy  Passive ROM    Passive ROM  PROM for ER in [place]                  PT Long Term Goals -  01/08/18 1209      PT LONG TERM GOAL #1   Title  Ind with an advanced HEP.    Status  On-going      PT LONG TERM GOAL #2   Title  Return to golfing with pain </= 2/10.     Baseline  at evaluation pt unable to play golf due to pain increasing to 8/10.     Status  On-going      PT LONG TERM GOAL #3   Title  Pt will be able to improve R shoulder flexion to >/= 170 degrees actively in order to improve pt's functional mobility.     Baseline  12/2317: 154 degrees actively    Status  On-going      PT LONG TERM GOAL #4   Title  Pt will improve R shoulder ER to >/= 75 degrees in order to improve functional mobility and return to golf.     Baseline  ER on 12/23/17 = 58 degrees actively    Status  On-going      PT LONG TERM GOAL #5   Title  ----------------------------------------------------------------------------------------    Baseline  --------------------------------------------------            Plan - 01/13/18 1204    Clinical Impression Statement  Pt arrived today doing about 50%  better overall with RT shldr pain. He was able to perform all therex today with minimal pain increase. Try sidelying ER next Rx. Normal response to modalities today. Ionto patch applied today.    Clinical Presentation  Stable    Clinical Decision Making  Low    Rehab Potential  Excellent    PT Frequency  2x / week    PT Duration  4 weeks    PT Treatment/Interventions  ADLs/Self Care Home Management;Cryotherapy;Electrical Stimulation;Iontophoresis 4mg /ml Dexamethasone;Functional mobility training;Therapeutic activities;Therapeutic exercise;Patient/family education;Passive range of motion;Manual techniques;Dry needling;Taping    PT Next Visit Plan  Shoulder isometrics, Shoulder ROM, gentle strengthening, mobilization, modalities as needed.     PT Home Exercise Plan  shoulder flexion with cane, ER with cane, 4 way isometrics    Consulted and Agree with Plan of Care  Patient       Patient will benefit from skilled therapeutic intervention in order to improve the following deficits and impairments:  Pain, Decreased range of motion, Decreased strength  Visit Diagnosis: Acute pain of right shoulder  Stiffness of right shoulder, not elsewhere classified  Acute pain of right knee  Difficulty in walking, not elsewhere classified  Stiffness of right knee, not elsewhere classified     Problem List Patient Active Problem List   Diagnosis Date Noted  . Eustachian tube dysfunction 12/14/2015  . Hyperlipidemia 06/13/2015    Briona Korpela,CHRIS, PTA 01/13/2018, 12:23 PM  [facility] [address] [place], [place], [address] Phone: [phone]   Fax:  [phone]  Name: Travis Wood MRN: [id number] Date of Birth: 05/27/1951

## 2018-01-15 ENCOUNTER — Ambulatory Visit: Payer: Medicare HMO | Admitting: Physical Therapy

## 2018-01-15 DIAGNOSIS — M25611 Stiffness of right shoulder, not elsewhere classified: Secondary | ICD-10-CM | POA: Diagnosis not present

## 2018-01-15 DIAGNOSIS — M25661 Stiffness of right knee, not elsewhere classified: Secondary | ICD-10-CM | POA: Diagnosis not present

## 2018-01-15 DIAGNOSIS — M25511 Pain in right shoulder: Secondary | ICD-10-CM | POA: Diagnosis not present

## 2018-01-15 DIAGNOSIS — M25561 Pain in right knee: Secondary | ICD-10-CM | POA: Diagnosis not present

## 2018-01-15 DIAGNOSIS — R262 Difficulty in walking, not elsewhere classified: Secondary | ICD-10-CM | POA: Diagnosis not present

## 2018-01-15 NOTE — Therapy (Signed)
[facility] [place], [place], [address] Phone: [phone]   Fax:  [phone]  Physical Therapy Treatment  Patient Details  Name: Travis Wood MRN: [id number] Date of Birth: Sep 02, 1951 Referring Provider: Dr. Kenn File   Encounter Date: 01/15/2018  PT End of Session - 01/15/18 1121    Visit Number  7    Number of Visits  9    Date for PT Re-Evaluation  02/20/18    PT Start Time  1115    PT Stop Time  1210    PT Time Calculation (min)  55 min    Activity Tolerance  Patient tolerated treatment well    Behavior During Therapy  [facility] for tasks assessed/performed       Past Medical History:  Diagnosis Date  . Erectile dysfunction   . History of bronchitis   . History of colon polyps   . Hyperlipemia   . Osteoarthritis   . Shortness of breath dyspnea     Past Surgical History:  Procedure Laterality Date  . APPENDECTOMY    . CATARACT EXTRACTION W/PHACO Left 10/15/2015   Procedure: CATARACT EXTRACTION PHACO AND INTRAOCULAR LENS PLACEMENT LEFT EYE;  Surgeon: Tonny Branch, MD;  Location: [facility];  Service: Ophthalmology;  Laterality: Left;  CDE:12.20  . CATARACT EXTRACTION W/PHACO Right 11/12/2015   Procedure: CATARACT EXTRACTION PHACO AND INTRAOCULAR LENS PLACEMENT RIGHT EYE:  CDE:  8.99;  Surgeon: Tonny Branch, MD;  Location: [facility];  Service: Ophthalmology;  Laterality: Right;  . KNEE SURGERY     Left x 2   . MASS EXCISION N/A 09/12/2015   Procedure: EXCISION SOFT TISSUE NEOPLASM (6 CM), BACK;  Surgeon: Aviva Signs, MD;  Location: [facility];  Service: General;  Laterality: N/A;    There were no vitals filed for this visit.  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     Limitations  Other (comment);House hold activities    How long can you sit comfortably?  unlimited     How long can you stand comfortably?  unlimited    How long can you walk comfortably?  unlimited    Patient Stated Goals  Return to playing golf without pain    Currently in Pain?  Yes    Pain Score  4     Pain Location  Shoulder    Pain Orientation  Right    Pain Descriptors / Indicators  Sore    Pain Type  Acute pain    Pain Onset  More than a month ago    Pain Frequency  Intermittent                      [facility] Adult PT Treatment/Exercise - 01/15/18 0001      Shoulder Exercises: Prone   Retraction  AROM;Strengthening;Right x30    Extension  AROM;Strengthening;Right x30    Horizontal ABduction 1  AROM;Strengthening x30      Shoulder Exercises: Sidelying   External Rotation  AROM;Strengthening;Right x30      Shoulder Exercises: Standing   External Rotation  AROM;Right;20 reps    Other Standing Exercises  RUE ranger into flex, small circles x20 reps and diagonals x20      Shoulder Exercises: ROM/Strengthening   UBE (Upper Arm Bike)  60 RPM x6 min (  3 min  forward, 3 min backward)      Shoulder Exercises: Isometric Strengthening   Flexion  5X10"    Extension  5X10"    External Rotation  5X10"    Internal Rotation  5X10"      Modalities   Modalities  Electrical Stimulation      Electrical Stimulation   Electrical Stimulation Location  R shoulder IFC x15 mins 80-150hz     Electrical Stimulation Goals  Pain;Tone      Vasopneumatic   Number Minutes Vasopneumatic   15 minutes    Vasopnuematic Location   Shoulder    Vasopneumatic Pressure  Medium    Vasopneumatic Temperature   34                  PT Long Term Goals - 01/08/18 1209      PT LONG TERM GOAL #1   Title  Ind with an advanced HEP.    Status  On-going      PT LONG TERM GOAL #2   Title  Return to golfing with pain </= 2/10.     Baseline  at evaluation pt unable to play golf due to pain increasing to 8/10.     Status  On-going      PT LONG TERM GOAL #3   Title  Pt will be able  to improve R shoulder flexion to >/= 170 degrees actively in order to improve pt's functional mobility.     Baseline  12/2317: 154 degrees actively    Status  On-going      PT LONG TERM GOAL #4   Title  Pt will improve R shoulder ER to >/= 75 degrees in order to improve functional mobility and return to golf.     Baseline  ER on 12/23/17 = 58 degrees actively    Status  On-going      PT LONG TERM GOAL #5   Title  ----------------------------------------------------------------------------------------    Baseline  --------------------------------------------------            Plan - 01/15/18 1250    Clinical Impression Statement  Patient reported no complaints during exercises. Patient demonstrated good form with prone exercises. Patient educated on importance on working on postural exercises for adequate shoulder ROM. Patient reported understanding. No adverse affects noted upon removal of modalities. Patient reported 3/10 pain at end of session.    Clinical Presentation  Stable    Clinical Decision Making  Low    Rehab Potential  Excellent    PT Frequency  2x / week    PT Duration  4 weeks    PT Treatment/Interventions  ADLs/Self Care Home Management;Cryotherapy;Electrical Stimulation;Iontophoresis 4mg /ml Dexamethasone;Functional mobility training;Therapeutic activities;Therapeutic exercise;Patient/family education;Passive range of motion;Manual techniques;Dry needling;Taping    Consulted and Agree with Plan of Care  Patient       Patient will benefit from skilled therapeutic intervention in order to improve the following deficits and impairments:  Pain, Decreased range of motion, Decreased strength  Visit Diagnosis: Acute pain of right shoulder  Stiffness of right shoulder, not elsewhere classified     Problem List Patient Active Problem List   Diagnosis Date Noted  . Eustachian tube dysfunction 12/14/2015  . Hyperlipidemia 06/13/2015   Gabriela Eves, PT,  DPT 01/15/2018, 12:52 PM  [facility] [address] [place], [place], [address] Phone: [phone]   Fax:  [phone]  Name: KYLE LUPPINO MRN: [id number] Date of Birth: July 31, 1951

## 2018-01-20 ENCOUNTER — Encounter: Payer: Self-pay | Admitting: Physical Therapy

## 2018-01-20 ENCOUNTER — Ambulatory Visit: Payer: Medicare HMO | Admitting: Physical Therapy

## 2018-01-20 DIAGNOSIS — M25661 Stiffness of right knee, not elsewhere classified: Secondary | ICD-10-CM | POA: Diagnosis not present

## 2018-01-20 DIAGNOSIS — R262 Difficulty in walking, not elsewhere classified: Secondary | ICD-10-CM | POA: Diagnosis not present

## 2018-01-20 DIAGNOSIS — M25611 Stiffness of right shoulder, not elsewhere classified: Secondary | ICD-10-CM | POA: Diagnosis not present

## 2018-01-20 DIAGNOSIS — M25561 Pain in right knee: Secondary | ICD-10-CM | POA: Diagnosis not present

## 2018-01-20 DIAGNOSIS — M25511 Pain in right shoulder: Secondary | ICD-10-CM | POA: Diagnosis not present

## 2018-01-20 NOTE — Patient Instructions (Signed)
  Strengthening: Resisted Flexion   Hold tubing with right arm at side. Pull forward and up. Move shoulder through pain-free range of motion. Repeat __10__ times per set. Do _2-3___ sets per session. Do _2-3___ sessions per day.  Strengthening: Resisted Extension   Hold tubing in right hand, arm forward. Pull arm back, elbow straight. Repeat __10__ times per set. Do _2-3___ sets per session. Do _2-3___ sessions per day.   Strengthening: Resisted Internal Rotation   Hold tubing in right hand, elbow at side and forearm out. Rotate forearm in across body. Repeat __10__ times per set. Do _2-3___ sets per session. Do _2-3___ sessions per day.   Strengthening: Resisted External Rotation   Hold tubing in right hand, elbow at side and forearm across body. Rotate forearm out. Repeat __10__ times per set. Do __2-3__ sets per session. Do ____ sessions per day.   

## 2018-01-20 NOTE — Therapy (Signed)
[facility] [place], [place], [address] Phone: [phone]   Fax:  [phone]  Physical Therapy Treatment  Patient Details  Name: ASTER ECKRICH MRN: [id number] Date of Birth: [birth year] Referring Provider: Dr. Kenn File   Encounter Date: 01/20/2018  PT End of Session - 01/20/18 1251    Visit Number  8    Number of Visits  9    Date for PT Re-Evaluation  02/20/18    PT Start Time  1233    PT Stop Time  1329    PT Time Calculation (min)  56 min    Activity Tolerance  Patient tolerated treatment well    Behavior During Therapy  [facility] for tasks assessed/performed       Past Medical History:  Diagnosis Date  . Erectile dysfunction   . History of bronchitis   . History of colon polyps   . Hyperlipemia   . Osteoarthritis   . Shortness of breath dyspnea     Past Surgical History:  Procedure Laterality Date  . APPENDECTOMY    . CATARACT EXTRACTION W/PHACO Left 10/15/2015   Procedure: CATARACT EXTRACTION PHACO AND INTRAOCULAR LENS PLACEMENT LEFT EYE;  Surgeon: Tonny Branch, MD;  Location: [facility];  Service: Ophthalmology;  Laterality: Left;  CDE:12.20  . CATARACT EXTRACTION W/PHACO Right 11/12/2015   Procedure: CATARACT EXTRACTION PHACO AND INTRAOCULAR LENS PLACEMENT RIGHT EYE:  CDE:  8.99;  Surgeon: Tonny Branch, MD;  Location: [facility];  Service: Ophthalmology;  Laterality: Right;  . KNEE SURGERY     Left x 2   . MASS EXCISION N/A 09/12/2015   Procedure: EXCISION SOFT TISSUE NEOPLASM (6 CM), BACK;  Surgeon: Aviva Signs, MD;  Location: [facility];  Service: General;  Laterality: N/A;    There were no vitals filed for this visit.  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     Limitations  Other (comment);House hold activities    How long can you sit comfortably?  unlimited     How long can you stand comfortably?  unlimited    How long can you walk comfortably?  unlimited    Patient Stated Goals  Return to playing golf without pain    Currently in Pain?  Yes    Pain Score  2     Pain Location  Shoulder    Pain Orientation  Right    Pain Descriptors / Indicators  Sore    Pain Type  Acute pain    Pain Onset  More than a month ago    Pain Frequency  Intermittent    Aggravating Factors   reaching back    Pain Relieving Factors  rest                      [facility] Adult PT Treatment/Exercise - 01/20/18 0001      Shoulder Exercises: Standing   Protraction  Strengthening;Right;20 reps;Theraband    Theraband Level (Shoulder Protraction)  Level 1 (Yellow)    External Rotation  Strengthening;20 reps;Limitations;Right    Theraband Level (Shoulder External Rotation)  Level 1 (Yellow)    Internal Rotation  Strengthening;Right;20 reps;Theraband    Theraband Level (Shoulder Internal Rotation)  Level 1 (Yellow)    Row  Strengthening;Right;20 reps;Theraband    Theraband Level (Shoulder Row)  Level 1 (Yellow)  Other Standing Exercises  2# diagnols on wall 2x10    Other Standing Exercises  RT D2 with yellow t-band x10 then red t-bandx10      Shoulder Exercises: ROM/Strengthening   UBE (Upper Arm Bike)  60 RPM x6 min (  3 min forward, 3 min backward)    Wall Wash  2x10      Iontophoresis   Type of Iontophoresis  Dexamethasone    Location  R lateral shoulder 5/6    Dose  1 ml    Time  8      Vasopneumatic   Number Minutes Vasopneumatic   15 minutes    Vasopnuematic Location   Shoulder    Vasopneumatic Pressure  Medium             PT Education - 01/20/18 1252    Education provided  Yes    Education Details  HEP    Person(s) Educated  Patient    Methods  Explanation;Demonstration;Handout    Comprehension  Verbalized understanding;Returned demonstration          PT Long Term Goals - 01/20/18 1311      PT LONG TERM GOAL #1   Title  Ind  with an advanced HEP.    Time  4    Period  Weeks    Status  Achieved 01/20/18      PT LONG TERM GOAL #2   Title  Return to golfing with pain </= 2/10.     Baseline  at evaluation pt unable to play golf due to pain increasing to 8/10.     Time  4    Period  Weeks    Status  On-going has not returned to playing at this time 01/20/18      PT LONG TERM GOAL #3   Title  Pt will be able to improve R shoulder flexion to >/= 170 degrees actively in order to improve pt's functional mobility.     Time  4    Period  Weeks    Status  Achieved 01/20/18      PT LONG TERM GOAL #4   Title  Pt will improve R shoulder ER to >/= 75 degrees in order to improve functional mobility and return to golf.     Baseline  ER on 12/23/17 = 58 degrees actively    Time  4    Period  Weeks    Status  On-going            Plan - 01/20/18 1312    Clinical Impression Statement  Patient tolerated treatment well today. Patient continues to improve and reported 70% improvement overall. Patient has improved right shoulder flexion to full range today. Patient given HEP for right shoulder strength progression. Patient doing well overall and has one visit remaining. Will determine DC or F/U with MD next week. Met HEP goal and flexion goals today.     Rehab Potential  Excellent    PT Frequency  2x / week    PT Duration  4 weeks    PT Treatment/Interventions  ADLs/Self Care Home Management;Cryotherapy;Electrical Stimulation;Iontophoresis 67m/ml Dexamethasone;Functional mobility training;Therapeutic activities;Therapeutic exercise;Patient/family education;Passive range of motion;Manual techniques;Dry needling;Taping    PT Next Visit Plan  cont 1 visit and assess DC/FU with MD    Consulted and Agree with Plan of Care  Patient       Patient will benefit from skilled therapeutic intervention in order to improve the following deficits and impairments:  Pain, Decreased range  of motion, Decreased strength  Visit  Diagnosis: Acute pain of right shoulder  Stiffness of right shoulder, not elsewhere classified     Problem List Patient Active Problem List   Diagnosis Date Noted  . Eustachian tube dysfunction 12/14/2015  . Hyperlipidemia 06/13/2015    Nicolle Heward P, PTA 01/20/2018, 1:45 PM  [facility] [place], [place], [address] Phone: [phone]   Fax:  [phone]  Name: JAXXSON CAVANAH MRN: [id number] Date of Birth: Jan 04, 1951

## 2018-01-22 ENCOUNTER — Encounter: Payer: Medicare HMO | Admitting: *Deleted

## 2018-01-27 ENCOUNTER — Ambulatory Visit: Payer: Medicare HMO | Admitting: Physical Therapy

## 2018-01-27 DIAGNOSIS — R262 Difficulty in walking, not elsewhere classified: Secondary | ICD-10-CM | POA: Diagnosis not present

## 2018-01-27 DIAGNOSIS — M25611 Stiffness of right shoulder, not elsewhere classified: Secondary | ICD-10-CM | POA: Diagnosis not present

## 2018-01-27 DIAGNOSIS — M25661 Stiffness of right knee, not elsewhere classified: Secondary | ICD-10-CM | POA: Diagnosis not present

## 2018-01-27 DIAGNOSIS — M25561 Pain in right knee: Secondary | ICD-10-CM | POA: Diagnosis not present

## 2018-01-27 DIAGNOSIS — M25511 Pain in right shoulder: Secondary | ICD-10-CM

## 2018-01-27 NOTE — Therapy (Signed)
[facility] [place], [place], [address] Phone: [phone]   Fax:  [phone]  Physical Therapy Treatment  Patient Details  Name: Travis Wood MRN: [id number] Date of Birth: [birth year] Referring Provider: Dr. Kenn File   Encounter Date: 01/27/2018  PT End of Session - 01/27/18 1143    Visit Number  9    Number of Visits  9    Date for PT Re-Evaluation  02/20/18    PT Start Time  1115    PT Stop Time  1201    PT Time Calculation (min)  46 min    Activity Tolerance  Patient tolerated treatment well    Behavior During Therapy  [facility] for tasks assessed/performed       Past Medical History:  Diagnosis Date  . Erectile dysfunction   . History of bronchitis   . History of colon polyps   . Hyperlipemia   . Osteoarthritis   . Shortness of breath dyspnea     Past Surgical History:  Procedure Laterality Date  . APPENDECTOMY    . CATARACT EXTRACTION W/PHACO Left 10/15/2015   Procedure: CATARACT EXTRACTION PHACO AND INTRAOCULAR LENS PLACEMENT LEFT EYE;  Surgeon: Tonny Branch, MD;  Location: [facility];  Service: Ophthalmology;  Laterality: Left;  CDE:12.20  . CATARACT EXTRACTION W/PHACO Right 11/12/2015   Procedure: CATARACT EXTRACTION PHACO AND INTRAOCULAR LENS PLACEMENT RIGHT EYE:  CDE:  8.99;  Surgeon: Tonny Branch, MD;  Location: [facility];  Service: Ophthalmology;  Laterality: Right;  . KNEE SURGERY     Left x 2   . MASS EXCISION N/A 09/12/2015   Procedure: EXCISION SOFT TISSUE NEOPLASM (6 CM), BACK;  Surgeon: Aviva Signs, MD;  Location: [facility];  Service: General;  Laterality: N/A;    There were no vitals filed for this visit.                   [facility] Adult PT Treatment/Exercise - 01/27/18 0001      Shoulder Exercises: Standing   Other Standing Exercises  Red Theraband RW4 and full can with 2# to fatigue x 2.      Shoulder Exercises: ROM/Strengthening   UBE (Upper Arm Bike)  90 RPM's x 6 minutes.       Modalities   Modalities  Vasopneumatic      Iontophoresis   Type of Iontophoresis  Dexamethasone    Location  Right lateal shoulder.    Dose  80 mA-min    Time  8      Vasopneumatic   Number Minutes Vasopneumatic   15 minutes    Vasopnuematic Location   -- Right shoulder    Vasopneumatic Pressure  Medium                  PT Long Term Goals - 01/27/18 1139      PT LONG TERM GOAL #1   Title  Ind with an advanced HEP.    Time  4    Period  Weeks    Status  Achieved      PT LONG TERM GOAL #2   Title  Return to golfing with pain </= 2/10.     Baseline  at evaluation pt unable to play golf due to pain increasing to 8/10.  "I'm going tomorrow" 01/28/18    Time  4    Period  Weeks    Status  On-going      PT LONG TERM GOAL #3   Title  Pt will be able to improve R shoulder flexion to >/= 170 degrees actively in order to improve pt's functional mobility.     Time  4    Period  Weeks    Status  Achieved      PT LONG TERM GOAL #4   Title  Pt will improve R shoulder ER to >/= 75 degrees in order to improve functional mobility and return to golf.     Baseline  80 degrees 01/27/18.    Time  4    Period  Weeks    Status  Achieved            Plan - 01/27/18 1225    Clinical Impression Statement  The patient did very well with PT.  No pain reported after treatment.  Discharge today.    PT Treatment/Interventions  ADLs/Self Care Home Management;Cryotherapy;Electrical Stimulation;Iontophoresis 76m/ml Dexamethasone;Functional mobility training;Therapeutic activities;Therapeutic exercise;Patient/family education;Passive range of motion;Manual techniques;Dry needling;Taping    Consulted and Agree with Plan of Care  Patient       Patient will benefit from skilled therapeutic intervention in order to improve the following deficits and impairments:  Pain, Decreased range of motion, Decreased strength  Visit Diagnosis: Acute pain of right shoulder  Stiffness of right  shoulder, not elsewhere classified     Problem List Patient Active Problem List   Diagnosis Date Noted  . Eustachian tube dysfunction 12/14/2015  . Hyperlipidemia 06/13/2015   PHYSICAL THERAPY DISCHARGE SUMMARY  Visits from Start of Care: 9.  Current functional level related to goals / functional outcomes: See above.   Remaining deficits: Goals essentially met.  He is scheduled to golf tomorrow, 01/27/18.   Education / Equipment: HEP.  Plan: Patient agrees to discharge.  Patient goals were partially met. Patient is being discharged due to being pleased with the current functional level.  ?????      Rashada Klontz, CMaliMPT 01/27/2018, 12:27 PM  [facility] [address]Phone: [phone]  Fax:  [phone] Name: Travis ALEWINEMRN: [id number] of Birth: [birth year]

## 2018-02-08 ENCOUNTER — Other Ambulatory Visit: Payer: Self-pay

## 2018-02-08 ENCOUNTER — Ambulatory Visit (AMBULATORY_SURGERY_CENTER): Payer: Self-pay

## 2018-02-08 ENCOUNTER — Encounter: Payer: Self-pay | Admitting: Internal Medicine

## 2018-02-08 VITALS — Ht 75.0 in | Wt 244.2 lb

## 2018-02-08 DIAGNOSIS — Z86010 Personal history of colonic polyps: Secondary | ICD-10-CM

## 2018-02-08 MED ORDER — NA SULFATE-K SULFATE-MG SULF 17.5-3.13-1.6 GM/177ML PO SOLN: 1.0000 | Freq: Once | ORAL | 0 refills | Status: AC

## 2018-02-08 NOTE — Progress Notes (Signed)
Denies allergies to eggs or soy products. Denies complication of anesthesia or sedation. Denies use of weight loss medication. Denies use of O2.   Emmi instructions declined.  

## 2018-02-22 ENCOUNTER — Ambulatory Visit (AMBULATORY_SURGERY_CENTER): Payer: Medicare HMO | Admitting: Internal Medicine

## 2018-02-22 ENCOUNTER — Encounter: Payer: Self-pay | Admitting: Internal Medicine

## 2018-02-22 VITALS — BP 114/72 | HR 63 | Temp 98.0°F | Resp 11 | Ht 75.0 in | Wt 244.0 lb

## 2018-02-22 DIAGNOSIS — Z86010 Personal history of colonic polyps: Secondary | ICD-10-CM

## 2018-02-22 DIAGNOSIS — E669 Obesity, unspecified: Secondary | ICD-10-CM | POA: Diagnosis not present

## 2018-02-22 DIAGNOSIS — Z800 Family history of malignant neoplasm of digestive organs: Secondary | ICD-10-CM | POA: Diagnosis not present

## 2018-02-22 DIAGNOSIS — D122 Benign neoplasm of ascending colon: Secondary | ICD-10-CM

## 2018-02-22 DIAGNOSIS — D123 Benign neoplasm of transverse colon: Secondary | ICD-10-CM | POA: Diagnosis not present

## 2018-02-22 MED ORDER — SODIUM CHLORIDE 0.9 % IV SOLN: 500.0000 mL | Freq: Once | INTRAVENOUS | Status: DC

## 2018-02-22 NOTE — Patient Instructions (Signed)
YOU HAD AN ENDOSCOPIC PROCEDURE TODAY AT THE [facility]:   Refer to the procedure report that was given to you for any specific questions about what was found during the examination.  If the procedure report does not answer your questions, please call your gastroenterologist to clarify.  If you requested that your care partner not be given the details of your procedure findings, then the procedure report has been included in a sealed envelope for you to review at your convenience later.  YOU SHOULD EXPECT: Some feelings of bloating in the abdomen. Passage of more gas than usual.  Walking can help get rid of the air that was put into your GI tract during the procedure and reduce the bloating. If you had a lower endoscopy (such as a colonoscopy or flexible sigmoidoscopy) you may notice spotting of blood in your stool or on the toilet paper. If you underwent a bowel prep for your procedure, you may not have a normal bowel movement for a few days.  Please Note:  You might notice some irritation and congestion in your nose or some drainage.  This is from the oxygen used during your procedure.  There is no need for concern and it should clear up in a day or so.  SYMPTOMS TO REPORT IMMEDIATELY:   Following lower endoscopy (colonoscopy or flexible sigmoidoscopy):  Excessive amounts of blood in the stool  Significant tenderness or worsening of abdominal pains  Swelling of the abdomen that is new, acute  Fever of 100F or higher  Please see handouts given to you on polyps, Diverticulosis and Hemorrhoids.  For urgent or emergent issues, a gastroenterologist can be reached at any hour by calling [phone].   DIET:  We do recommend a small meal at first, but then you may proceed to your regular diet.  Drink plenty of fluids but you should avoid alcoholic beverages for 24 hours.  ACTIVITY:  You should plan to take it easy for the rest of today and you should NOT DRIVE or use heavy  machinery until tomorrow (because of the sedation medicines used during the test).    FOLLOW UP: Our staff will call the number listed on your records the next business day following your procedure to check on you and address any questions or concerns that you may have regarding the information given to you following your procedure. If we do not reach you, we will leave a message.  However, if you are feeling well and you are not experiencing any problems, there is no need to return our call.  We will assume that you have returned to your regular daily activities without incident.  If any biopsies were taken you will be contacted by phone or by letter within the next 1-3 weeks.  Please call us at [phone] if you have not heard about the biopsies in 3 weeks.    SIGNATURES/CONFIDENTIALITY: You and/or your care partner have signed paperwork which will be entered into your electronic medical record.  These signatures attest to the fact that that the information above on your After Visit Summary has been reviewed and is understood.  Full responsibility of the confidentiality of this discharge information lies with you and/or your care-partner.  Thank you for letting us take care of your healthcare needs today. 

## 2018-02-22 NOTE — Progress Notes (Signed)
To recovery, report to RN, VSS. 

## 2018-02-22 NOTE — Progress Notes (Signed)
Pt's states no medical or surgical changes since previsit or office visit. 

## 2018-02-22 NOTE — Op Note (Signed)
[facility] Patient Name: Travis Wood Procedure Date: 02/22/2018 9:31 AM MRN: [id number] Endoscopist: Docia Chuck. Henrene Pastor , MD Age: 67 Referring MD:  Date of Birth: [birth year] Gender: Male Account #: [id number] Procedure:                Colonoscopy, With cold snare polypectomy x 4 Indications:              High risk colon cancer surveillance: Personal                            history of multiple (3 or more) adenomas. Father                            with colon cancer in mid 36s. Had multiple prior                            examinations elsewhere then most recent examination                            here August 2012. Overdue for recommended follow-up Medicines:                Monitored Anesthesia Care Procedure:                Pre-Anesthesia Assessment:                           - Prior to the procedure, a History and Physical                            was performed, and patient medications and                            allergies were reviewed. The patient's tolerance of                            previous anesthesia was also reviewed. The risks                            and benefits of the procedure and the sedation                            options and risks were discussed with the patient.                            All questions were answered, and informed consent                            was obtained. Prior Anticoagulants: The patient has                            taken no previous anticoagulant or antiplatelet                            agents. ASA Grade Assessment: II - A patient with  mild systemic disease. After reviewing the risks                            and benefits, the patient was deemed in                            satisfactory condition to undergo the procedure.                           After obtaining informed consent, the colonoscope                            was passed under direct vision. Throughout the              procedure, the patient's blood pressure, pulse, and                            oxygen saturations were monitored continuously. The                            Colonoscope was introduced through the anus and                            advanced to the the cecum, identified by                            appendiceal orifice and ileocecal valve. The                            ileocecal valve, appendiceal orifice, and rectum                            were photographed. The quality of the bowel                            preparation was good. The colonoscopy was performed                            without difficulty. The patient tolerated the                            procedure well. The bowel preparation used was                            SUPREP. Scope In: 9:41:00 AM Scope Out: 10:00:50 AM Scope Withdrawal Time: 0 hours 13 minutes 50 seconds  Total Procedure Duration: 0 hours 19 minutes 50 seconds  Findings:                 Four polyps were found in the transverse colon and                            ascending colon. The polyps were 2 to 5 mm in size.  These polyps were removed with a cold snare.                            Resection and retrieval were complete.                           Multiple diverticula were found in the sigmoid                            colon.                           Internal hemorrhoids were found during retroflexion.                           The exam was otherwise without abnormality on                            direct and retroflexion views. Complications:            No immediate complications. Estimated blood loss:                            None. Estimated Blood Loss:     Estimated blood loss: none. Impression:               - Four 2 to 5 mm polyps in the transverse colon and                            in the ascending colon, removed with a cold snare.                            Resected and retrieved.                            - Diverticulosis in the sigmoid colon.                           - Internal hemorrhoids.                           - The examination was otherwise normal on direct                            and retroflexion views. Recommendation:           - Repeat colonoscopy in 3 years for surveillance.                           - Patient has a contact number available for                            emergencies. The signs and symptoms of potential                            delayed complications were discussed with the  patient. Return to normal activities tomorrow.                            Written discharge instructions were provided to the                            patient.                           - Resume previous diet.                           - Continue present medications.                           - Await pathology results. Docia Chuck. Henrene Pastor, MD 02/22/2018 10:07:54 AM This report has been signed electronically.

## 2018-02-22 NOTE — Progress Notes (Signed)
Called to room to assist during endoscopic procedure.  Patient ID and intended procedure confirmed with present staff. Received instructions for my participation in the procedure from the performing physician.  

## 2018-02-23 ENCOUNTER — Telehealth: Payer: Self-pay

## 2018-02-23 NOTE — Telephone Encounter (Signed)
  Follow up Call-  Call back number 02/22/2018  Post procedure Call Back phone  # [phone]  Permission to leave phone message Yes  Some recent data might be hidden     Patient questions:  Do you have a fever, pain , or abdominal swelling? No. Pain Score  0 *  Have you tolerated food without any problems? Yes.    Have you been able to return to your normal activities? Yes.    Do you have any questions about your discharge instructions: Diet   No. Medications  No. Follow up visit  No.  Do you have questions or concerns about your Care? No.  Actions: * If pain score is 4 or above: No action needed, pain <4.  No problems noted per pt. maw

## 2018-02-26 ENCOUNTER — Encounter: Payer: Self-pay | Admitting: Internal Medicine

## 2018-05-05 ENCOUNTER — Telehealth: Payer: Self-pay | Admitting: Family Medicine

## 2018-05-05 NOTE — Telephone Encounter (Signed)
lmtcb jkp 6/5

## 2018-05-05 NOTE — Telephone Encounter (Signed)
Patient is requesting we [facility] his [facility] and he is also requesting a refill.  Patient's last visit for hyperlipidemia was 08/29/2016, last labs 08/29/2016; has had visit with Dr. Wendi Snipes in January 2019 for shoulder problems.  Please advise how to proceed.

## 2018-05-05 NOTE — Telephone Encounter (Signed)
Pt. Needs to be seen for this. Thanks, WS 

## 2018-05-13 ENCOUNTER — Encounter: Payer: Self-pay | Admitting: Family Medicine

## 2018-05-13 ENCOUNTER — Ambulatory Visit (INDEPENDENT_AMBULATORY_CARE_PROVIDER_SITE_OTHER): Payer: Medicare HMO | Admitting: Family Medicine

## 2018-05-13 VITALS — Temp 97.6°F | Ht 75.0 in | Wt 231.1 lb

## 2018-05-13 DIAGNOSIS — E782 Mixed hyperlipidemia: Secondary | ICD-10-CM

## 2018-05-13 LAB — CMP14+EGFR
ALT: 13 [IU]/L (ref 0–44)
AST: 13 [IU]/L (ref 0–40)
Albumin/Globulin Ratio: 2.1 (ref 1.2–2.2)
Albumin: 4.2 g/dL (ref 3.6–4.8)
Alkaline Phosphatase: 92 [IU]/L (ref 39–117)
BUN/Creatinine Ratio: 15 (ref 10–24)
BUN: 14 mg/dL (ref 8–27)
Bilirubin Total: 0.5 mg/dL (ref 0.0–1.2)
CO2: 26 mmol/L (ref 20–29)
Calcium: 9.1 mg/dL (ref 8.6–10.2)
Chloride: 105 mmol/L (ref 96–106)
Creatinine, Ser: 0.92 mg/dL (ref 0.76–1.27)
GFR calc Af Amer: 100 mL/min/{1.73_m2}
GFR calc non Af Amer: 86 mL/min/{1.73_m2}
Globulin, Total: 2 g/dL (ref 1.5–4.5)
Glucose: 91 mg/dL (ref 65–99)
Potassium: 5 mmol/L (ref 3.5–5.2)
Sodium: 143 mmol/L (ref 134–144)
Total Protein: 6.2 g/dL (ref 6.0–8.5)

## 2018-05-13 LAB — LIPID PANEL
Chol/HDL Ratio: 5.6 ratio — ABNORMAL HIGH (ref 0.0–5.0)
Cholesterol, Total: 201 mg/dL — ABNORMAL HIGH (ref 100–199)
HDL: 36 mg/dL — AB (ref >39)
LDL Calculated: 149 mg/dL — ABNORMAL HIGH (ref 0–99)
Triglycerides: 82 mg/dL (ref 0–149)
VLDL CHOLESTEROL CAL: 16 mg/dL (ref 5–40)

## 2018-05-13 MED ORDER — ATORVASTATIN CALCIUM 40 MG PO TABS: 40.0000 mg | ORAL_TABLET | Freq: Every day | ORAL | 1 refills | Status: DC

## 2018-05-13 NOTE — Patient Instructions (Signed)

## 2018-05-13 NOTE — Telephone Encounter (Signed)
Taken care of, pt had appt 05/13/18, medication was refilled

## 2018-05-14 ENCOUNTER — Telehealth: Payer: Self-pay | Admitting: Family Medicine

## 2018-05-14 NOTE — Telephone Encounter (Signed)
Left voice mail with results of labs.

## 2018-05-17 ENCOUNTER — Encounter: Payer: Self-pay | Admitting: Family Medicine

## 2018-05-17 NOTE — Progress Notes (Signed)
Subjective:  Patient ID: Travis Wood, male    DOB: 07/18/1951  Age: 67 y.o. MRN: [id number]  CC: Medical Management of Chronic Issues   HPI Travis Wood presents for patient in for follow-up of elevated cholesterol. Doing well without complaints on current medication. Denies side effects of statin including myalgia and arthralgia and nausea. Also in today for liver function testing. Currently no chest pain, shortness of breath or other cardiovascular related symptoms noted.  Depression screen [facility] 2/9 05/13/2018 12/17/2017 10/08/2017  Decreased Interest 0 0 0  Down, Depressed, Hopeless 0 0 0  PHQ - 2 Score 0 0 0    History Travis Wood has a past medical history of Cataract, Erectile dysfunction, History of bronchitis, History of colon polyps, Hyperlipemia, Osteoarthritis, and Shortness of breath dyspnea.   Travis Wood has a past surgical history that includes Appendectomy; Knee surgery; Mass excision (N/A, 09/12/2015); Cataract extraction w/PHACO (Left, 10/15/2015); and Cataract extraction w/PHACO (Right, 11/12/2015).   Travis Wood family history includes Cancer in Travis Wood sister; Colon cancer in Travis Wood father; Colon polyps in Travis Wood father; Heart disease in Travis Wood father and mother.Travis Wood reports that Travis Wood quit smoking about 26 years ago. Travis Wood smoking use included cigarettes. Travis Wood has a 5.00 pack-year smoking history. Travis Wood has never used smokeless tobacco. Travis Wood reports that Travis Wood drinks about 0.6 oz of alcohol per week. Travis Wood reports that Travis Wood does not use drugs.    ROS Review of Systems  Constitutional: Negative.   HENT: Negative.   Eyes: Negative for visual disturbance.  Respiratory: Negative for cough and shortness of breath.   Cardiovascular: Negative for chest pain and leg swelling.  Gastrointestinal: Negative for abdominal pain, diarrhea, nausea and vomiting.  Genitourinary: Negative for difficulty urinating.  Musculoskeletal: Negative for arthralgias and myalgias.  Skin: Negative for rash.  Neurological: Negative for headaches.    Psychiatric/Behavioral: Negative for sleep disturbance.    Objective:  Temp 97.6 F (36.4 C) (Oral)   Ht _0  (1.905 m)   Wt 231 lb 2 oz (104.8 kg)   BMI 28.89 kg/m   BP Readings from Last 3 Encounters:  02/22/18 114/72  12/17/17 120/72  10/08/17 (!) 115/58    Wt Readings from Last 3 Encounters:  05/13/18 231 lb 2 oz (104.8 kg)  02/22/18 244 lb (110.7 kg)  02/08/18 244 lb 3.2 oz (110.8 kg)     Physical Exam  Constitutional: Travis Wood is oriented to person, place, and time. Travis Wood appears well-developed and well-nourished. No distress.  HENT:  Head: Normocephalic and atraumatic.  Right Ear: External ear normal.  Left Ear: External ear normal.  Nose: Nose normal.  Mouth/Throat: Oropharynx is clear and moist.  Eyes: Pupils are equal, round, and reactive to light. Conjunctivae and EOM are normal.  Neck: Normal range of motion. Neck supple. No thyromegaly present.  Cardiovascular: Normal rate, regular rhythm and normal heart sounds.  No murmur heard. Pulmonary/Chest: Effort normal and breath sounds normal. No respiratory distress. Travis Wood has no wheezes. Travis Wood has no rales.  Abdominal: Soft. Bowel sounds are normal. Travis Wood exhibits no distension. There is no tenderness.  Lymphadenopathy:    Travis Wood has no cervical adenopathy.  Neurological: Travis Wood is alert and oriented to person, place, and time. Travis Wood has normal reflexes.  Skin: Skin is warm and dry.  Psychiatric: Travis Wood has a normal mood and affect. Travis Wood behavior is normal. Judgment and thought content normal.      Assessment & Plan:   [name] was seen today for medical management of chronic issues.  Diagnoses  and all orders for this visit:  Mixed hyperlipidemia -     CMP14+EGFR -     Lipid panel  Other orders -     atorvastatin (LIPITOR) 40 MG tablet; Take 1 tablet (40 mg total) by mouth daily. For cholesterol       I am having Travis Wood maintain Travis Wood Vitamin D, Multiple Vitamin, and atorvastatin. We will continue to administer sodium  chloride.  Allergies as of 05/13/2018   No Known Allergies     Medication List        Accurate as of 05/13/18 11:59 PM. Always use your most recent med list.          atorvastatin 40 MG tablet Commonly known as:  LIPITOR Take 1 tablet (40 mg total) by mouth daily. For cholesterol   Multiple Vitamin tablet Take 1 tablet by mouth daily.   Vitamin D 2000 units tablet Take 1 tablet (2,000 Units total) by mouth daily.      Heart healthy eating plan handout reviewed and given to patient Follow-up: Return in about 6 months (around 11/12/2018).  Claretta Fraise, M.D.

## 2018-05-31 ENCOUNTER — Ambulatory Visit (INDEPENDENT_AMBULATORY_CARE_PROVIDER_SITE_OTHER): Payer: Medicare HMO | Admitting: *Deleted

## 2018-05-31 ENCOUNTER — Encounter: Payer: Self-pay | Admitting: *Deleted

## 2018-05-31 VITALS — BP 109/72 | HR 65 | Ht 75.0 in | Wt 227.0 lb

## 2018-05-31 DIAGNOSIS — Z23 Encounter for immunization: Secondary | ICD-10-CM | POA: Diagnosis not present

## 2018-05-31 DIAGNOSIS — Z0000 Encounter for general adult medical examination without abnormal findings: Secondary | ICD-10-CM

## 2018-05-31 NOTE — Patient Instructions (Addendum)
You received your Prevnar (pneumonia vaccine), you will need the pneumovax in 1 year.    Please review the information given on Advanced Directives, and if you complete these please bring our office a copy to be filed in your medical record.  Please work on your goal of reducing fried foods.   Thank you for coming in for your Annual Wellness Visit today!   Preventive Care 12 Years and Older, Male Preventive care refers to lifestyle choices and visits with your health care provider that can promote health and wellness. What does preventive care include?  A yearly physical exam. This is also called an annual well check.  Dental exams once or twice a year.  Routine eye exams. Ask your health care provider how often you should have your eyes checked.  Personal lifestyle choices, including: ? Daily care of your teeth and gums. ? Regular physical activity. ? Eating a healthy diet. ? Avoiding tobacco and drug use. ? Limiting alcohol use. ? Practicing safe sex. ? Taking low doses of aspirin every day. ? Taking vitamin and mineral supplements as recommended by your health care provider. What happens during an annual well check? The services and screenings done by your health care provider during your annual well check will depend on your age, overall health, lifestyle risk factors, and family history of disease. Counseling Your health care provider may ask you questions about your:  Alcohol use.  Tobacco use.  Drug use.  Emotional well-being.  Home and relationship well-being.  Sexual activity.  Eating habits.  History of falls.  Memory and ability to understand (cognition).  Work and work Statistician.  Screening You may have the following tests or measurements:  Height, weight, and BMI.  Blood pressure.  Lipid and cholesterol levels. These may be checked every 5 years, or more frequently if you are over 33 years old.  Skin check.  Lung cancer screening. You  may have this screening every year starting at age 59 if you have a 30-pack-year history of smoking and currently smoke or have quit within the past 15 years.  Fecal occult blood test (FOBT) of the stool. You may have this test every year starting at age 54.  Flexible sigmoidoscopy or colonoscopy. You may have a sigmoidoscopy every 5 years or a colonoscopy every 10 years starting at age 32.  Prostate cancer screening. Recommendations will vary depending on your family history and other risks.  Hepatitis C blood test.  Hepatitis B blood test.  Sexually transmitted disease (STD) testing.  Diabetes screening. This is done by checking your blood sugar (glucose) after you have not eaten for a while (fasting). You may have this done every 1-3 years.  Abdominal aortic aneurysm (AAA) screening. You may need this if you are a current or former smoker.  Osteoporosis. You may be screened starting at age 69 if you are at high risk.  Talk with your health care provider about your test results, treatment options, and if necessary, the need for more tests. Vaccines Your health care provider may recommend certain vaccines, such as:  Influenza vaccine. This is recommended every year.  Tetanus, diphtheria, and acellular pertussis (Tdap, Td) vaccine. You may need a Td booster every 10 years.  Varicella vaccine. You may need this if you have not been vaccinated.  Zoster vaccine. You may need this after age 28.  Measles, mumps, and rubella (MMR) vaccine. You may need at least one dose of MMR if you were born in 1957 or  later. You may also need a second dose.  Pneumococcal 13-valent conjugate (PCV13) vaccine. One dose is recommended after age 56.  Pneumococcal polysaccharide (PPSV23) vaccine. One dose is recommended after age 52.  Meningococcal vaccine. You may need this if you have certain conditions.  Hepatitis A vaccine. You may need this if you have certain conditions or if you travel or work  in places where you may be exposed to hepatitis A.  Hepatitis B vaccine. You may need this if you have certain conditions or if you travel or work in places where you may be exposed to hepatitis B.  Haemophilus influenzae type b (Hib) vaccine. You may need this if you have certain risk factors.  Talk to your health care provider about which screenings and vaccines you need and how often you need them. This information is not intended to replace advice given to you by your health care provider. Make sure you discuss any questions you have with your health care provider. Document Released: 12/14/2015 Document Revised: 08/06/2016 Document Reviewed: 09/18/2015 Elsevier Interactive Patient Education  [employer].

## 2018-06-01 NOTE — Progress Notes (Signed)
Subjective:   Travis Wood is a 67 y.o. male who presents for an Initial Medicare Annual Wellness Visit.  Travis Wood is retired from [employer].  He enjoys playing golf and working around his home.  He lives with his significant other, and 2 german shepherd dogs.  Travis Wood has 2 sons and 3 grandchildren.  He feels his health is the same this year as it was last year.  He reports no hospitalizations, ER visits, or surgeries in the past year.   Review of Systems  All negative today      Objective:    Today's Vitals   05/31/18 1421  BP: 109/72  Pulse: 65  Weight: 227 lb (103 kg)  Height: 6\' 3"  (1.905 m)  PainSc: 0-No pain   Body mass index is 28.37 kg/m.  Advanced Directives 06/01/2018 05/31/2018 03/09/2017 02/13/2017 02/02/2017 11/12/2015 09/12/2015  Does Patient Have a Medical Advance Directive? - No No No No No No  Would patient like information on creating a medical advance directive? Yes (MAU/Ambulatory/Procedural Areas - Information given) - - - - No - patient declined information No - patient declined information    Current Medications (verified) Outpatient Encounter Medications as of 05/31/2018  Medication Sig  . atorvastatin (LIPITOR) 40 MG tablet Take 1 tablet (40 mg total) by mouth daily. For cholesterol  . Multiple Vitamin tablet Take 1 tablet by mouth daily.  . Cholecalciferol (VITAMIN D) 2000 UNITS tablet Take 1 tablet (2,000 Units total) by mouth daily. (Patient not taking: Reported on 05/31/2018)   Facility-Administered Encounter Medications as of 05/31/2018  Medication  . 0.9 %  sodium chloride infusion    Allergies (verified) Patient has no known allergies.   History: Past Medical History:  Diagnosis Date  . Cataract   . Erectile dysfunction   . History of bronchitis   . History of colon polyps   . Hyperlipemia   . Osteoarthritis   . Shortness of breath dyspnea    Past Surgical History:  Procedure Laterality Date  . APPENDECTOMY    . CATARACT  EXTRACTION W/PHACO Left 10/15/2015   Procedure: CATARACT EXTRACTION PHACO AND INTRAOCULAR LENS PLACEMENT LEFT EYE;  Surgeon: Tonny Branch, MD;  Location: [facility];  Service: Ophthalmology;  Laterality: Left;  CDE:12.20  . CATARACT EXTRACTION W/PHACO Right 11/12/2015   Procedure: CATARACT EXTRACTION PHACO AND INTRAOCULAR LENS PLACEMENT RIGHT EYE:  CDE:  8.99;  Surgeon: Tonny Branch, MD;  Location: [facility];  Service: Ophthalmology;  Laterality: Right;  . KNEE SURGERY     Left x 2   . MASS EXCISION N/A 09/12/2015   Procedure: EXCISION SOFT TISSUE NEOPLASM (6 CM), BACK;  Surgeon: Aviva Signs, MD;  Location: [facility];  Service: General;  Laterality: N/A;   Family History  Problem Relation Age of Onset  . Colon cancer Father        age 14   . Colon polyps Father   . Heart disease Father        Heart failure  . Heart disease Mother   . Cancer Sister        Uterine, and ovarian  . Esophageal cancer Neg Hx   . Liver cancer Neg Hx   . Pancreatic cancer Neg Hx   . Rectal cancer Neg Hx   . Stomach cancer Neg Hx    Social History   Socioeconomic History  . Marital status: Single    Spouse name: Not on file  . Number of children: 2  .  Years of education: Not on file  . Highest education level: Not on file  Occupational History  . Occupation: [employer] nurse  . Financial resource strain: Not on file  . Food insecurity:    Worry: Not on file    Inability: Not on file  . Transportation needs:    Medical: Not on file    Non-medical: Not on file  Tobacco Use  . Smoking status: Former Smoker    Packs/day: 1.00    Years: 5.00    Pack years: 5.00    Types: Cigarettes    Last attempt to quit: 09/10/1991    Years since quitting: 26.7  . Smokeless tobacco: Never Used  Substance and Sexual Activity  . Alcohol use: Yes    Alcohol/week: 0.6 oz    Types: 1 Cans of beer per week  . Drug use: Yes    Frequency: 1.0 times per week    Types: Marijuana  . Sexual activity: Yes    Lifestyle  . Physical activity:    Days per week: Not on file    Minutes per session: Not on file  . Stress: Not on file  Relationships  . Social connections:    Talks on phone: Not on file    Gets together: Not on file    Attends religious service: Not on file    Active member of club or organization: Not on file    Attends meetings of clubs or organizations: Not on file    Relationship status: Not on file  Other Topics Concern  . Not on file  Social History Narrative   0 caffeine drinks daily    Tobacco Counseling Counseling given: No   Clinical Intake:     Pain Score: 0-No pain                 Activities of Daily Living In your present state of health, do you have any difficulty performing the following activities: 05/31/2018  Hearing? N  Vision? N  Comment Lasik 2016  Walking or climbing stairs? Y  Comment Right knee pain with going upstairs  Dressing or bathing? N  Doing errands, shopping? N  Preparing Food and eating ? N  Using the Toilet? N  In the past six months, have you accidently leaked urine? N  Do you have problems with loss of bowel control? N  Managing your Medications? N  Managing your Finances? N  Housekeeping or managing your Housekeeping? N  Some recent data might be hidden     Immunizations and Health Maintenance Immunization History  Administered Date(s) Administered  . DTaP 04/21/2011  . Influenza, High Dose Seasonal PF 08/29/2016  . Pneumococcal Conjugate-13 05/31/2018   Health Maintenance Due  Topic Date Due  . PNA vac Low Risk Adult (1 of 2 - PCV13) 07/23/2016    Patient Care Team: Claretta Fraise, MD as PCP - General (Family Medicine)      Assessment:   This is a routine wellness examination for Travis Wood.  Hearing/Vision screen No hearing deficit noted. Patient states he sees well since having bilateral cataract surgery in 2016.  Dietary issues and exercise activities discussed: Current Exercise Habits: The  patient does not participate in regular exercise at present  Patient states he plays golf several times per week, and stays active around his home.  He is currently building a deck at his home.   Goals    . DIET - REDUCE FAT INTAKE  Reduce intake of fried foods.     He states he eats 2 smaller meals per day and 1 large meal per day.  He states he eats fruits and vegetables daily.  He would like to work on avoiding fried foods more often.  Recommended diet of mostly lean proteins, vegetables, fruits, and whole grains.     Depression Screen PHQ 2/9 Scores 05/31/2018 05/13/2018 12/17/2017 10/08/2017  PHQ - 2 Score 0 0 0 0    Fall Risk Fall Risk  05/31/2018 05/13/2018 12/17/2017 10/08/2017 05/06/2017  Falls in the past year? No No No No No  Number falls in past yr: - - - - -  Injury with Fall? - - - - -    Is the patient's home free of loose throw rugs in walkways, pet beds, electrical cords, etc?   yes      Grab bars in the bathroom? no      Handrails on the stairs?   yes      Adequate lighting?   yes    Cognitive Function: MMSE - Mini Mental State Exam 06/01/2018  Orientation to time 5  Orientation to Place 4  Registration 3  Attention/ Calculation 5  Recall 3  Language- name 2 objects 2  Language- repeat 1  Language- follow 3 step command 3  Language- read & follow direction 1  Write a sentence 1  Copy design 1  Total score 29        Screening Tests Health Maintenance  Topic Date Due  . PNA vac Low Risk Adult (1 of 2 - PCV13) 07/23/2016  . INFLUENZA VACCINE  07/01/2018  . TETANUS/TDAP  01/08/2021  . COLONOSCOPY  02/22/2021  . Hepatitis C Screening  Completed   Prevnar given 05/31/18  Qualifies for Shingles Vaccine? Declined today  Cancer Screenings: Lung: Low Dose CT Chest recommended if Age 76-80 years, 30 pack-year currently smoking OR have quit w/in 15years. Patient does not qualify. Colorectal: up to date  Additional Screenings: Hepatitis C Screening: Completed  08/29/2016      Plan:    You received your Prevnar (pneumonia vaccine), you will need the pneumovax in 1 year.   Review the information given on Advanced Directives, and if you complete these please bring our office a copy to be filed in your medical record. Work on goal of reducing fried foods.    I have personally reviewed and noted the following in the patient's chart:   . Medical and social history . Use of alcohol, tobacco or illicit drugs  . Current medications and supplements . Functional ability and status . Nutritional status . Physical activity . Advanced directives . List of other physicians . Hospitalizations, surgeries, and ER visits in previous 12 months . Vitals . Screenings to include cognitive, depression, and falls . Referrals and appointments  In addition, I have reviewed and discussed with patient certain preventive protocols, quality metrics, and best practice recommendations. A written personalized care plan for preventive services as well as general preventive health recommendations were provided to patient.     Raef Sprigg M, RN   06/01/2018     I have reviewed and agree with the above AWV documentation.  Claretta Fraise, M.D.

## 2018-09-01 DIAGNOSIS — R69 Illness, unspecified: Secondary | ICD-10-CM | POA: Diagnosis not present

## 2018-11-12 ENCOUNTER — Encounter: Payer: Medicare HMO | Admitting: Family Medicine

## 2018-11-12 ENCOUNTER — Other Ambulatory Visit: Payer: Self-pay | Admitting: Family Medicine

## 2018-11-16 ENCOUNTER — Encounter: Payer: Self-pay | Admitting: Family Medicine

## 2018-12-22 ENCOUNTER — Encounter: Payer: Self-pay | Admitting: Family Medicine

## 2018-12-22 ENCOUNTER — Ambulatory Visit (INDEPENDENT_AMBULATORY_CARE_PROVIDER_SITE_OTHER): Payer: Medicare HMO | Admitting: Family Medicine

## 2018-12-22 VITALS — BP 99/64 | HR 58 | Temp 97.3°F | Ht 75.0 in | Wt 234.2 lb

## 2018-12-22 DIAGNOSIS — E782 Mixed hyperlipidemia: Secondary | ICD-10-CM

## 2018-12-22 DIAGNOSIS — Z0001 Encounter for general adult medical examination with abnormal findings: Secondary | ICD-10-CM | POA: Diagnosis not present

## 2018-12-22 DIAGNOSIS — M75101 Unspecified rotator cuff tear or rupture of right shoulder, not specified as traumatic: Secondary | ICD-10-CM

## 2018-12-22 DIAGNOSIS — Z125 Encounter for screening for malignant neoplasm of prostate: Secondary | ICD-10-CM | POA: Diagnosis not present

## 2018-12-22 DIAGNOSIS — Z0000 Encounter for general adult medical examination without abnormal findings: Secondary | ICD-10-CM

## 2018-12-22 LAB — URINALYSIS
Bilirubin, UA: NEGATIVE
Glucose, UA: NEGATIVE
Ketones, UA: NEGATIVE
Leukocytes, UA: NEGATIVE
Nitrite, UA: NEGATIVE
Protein, UA: NEGATIVE
RBC, UA: NEGATIVE
Specific Gravity, UA: 1.015 (ref 1.005–1.030)
Urobilinogen, Ur: 0.2 mg/dL (ref 0.2–1.0)
pH, UA: 7 (ref 5.0–7.5)

## 2018-12-22 MED ORDER — NABUMETONE 500 MG PO TABS: 1000.0000 mg | ORAL_TABLET | Freq: Two times a day (BID) | ORAL | 1 refills | Status: DC

## 2018-12-22 NOTE — Progress Notes (Signed)
Subjective:  Patient ID: TEE RICHESON, male    DOB: 1951/11/01  Age: 68 y.o. MRN: [id number]  CC: Annual Exam (pt here today for CPE and also c/o right shoulder pain that didn't really get better after cortisone shot and PT)   HPI JEREMIAS BROYHILL presents for Complete physical with ortho referral for right rotator cuff pain. Painful ROM.   Depression screen [facility] 2/9 12/22/2018 05/31/2018 05/13/2018  Decreased Interest 0 0 0  Down, Depressed, Hopeless 0 0 0  PHQ - 2 Score 0 0 0    History Brysin has a past medical history of Cataract, Erectile dysfunction, History of bronchitis, History of colon polyps, Hyperlipemia, Osteoarthritis, and Shortness of breath dyspnea.   He has a past surgical history that includes Appendectomy; Knee surgery; Mass excision (N/A, 09/12/2015); Cataract extraction w/PHACO (Left, 10/15/2015); and Cataract extraction w/PHACO (Right, 11/12/2015).   His family history includes Cancer in his sister; Colon cancer in his father; Colon polyps in his father; Heart disease in his father and mother.He reports that he quit smoking about 27 years ago. His smoking use included cigarettes. He has a 5.00 pack-year smoking history. He has never used smokeless tobacco. He reports current alcohol use of about 1.0 standard drinks of alcohol per week. He reports current drug use. Frequency: 1.00 time per week. Drug: Marijuana.    ROS Review of Systems  Constitutional: Negative for activity change, fatigue and unexpected weight change.  HENT: Negative for congestion, ear pain, hearing loss, postnasal drip and trouble swallowing.   Eyes: Negative for pain and visual disturbance.  Respiratory: Negative for cough, chest tightness and shortness of breath.   Cardiovascular: Negative for chest pain, palpitations and leg swelling.  Gastrointestinal: Negative for abdominal distention, abdominal pain, blood in stool, constipation, diarrhea, nausea and vomiting.  Endocrine: Negative for cold  intolerance, heat intolerance and polydipsia.  Genitourinary: Negative for difficulty urinating, dysuria, flank pain, frequency and urgency.  Musculoskeletal: Positive for arthralgias. Negative for joint swelling.  Skin: Negative for color change, rash and wound.  Neurological: Negative for dizziness, syncope, speech difficulty, weakness, light-headedness, numbness and headaches.  Hematological: Does not bruise/bleed easily.  Psychiatric/Behavioral: Negative for confusion, decreased concentration, dysphoric mood and sleep disturbance. The patient is not nervous/anxious.     Objective:  BP 99/64   Pulse (!) 58   Temp (!) 97.3 F (36.3 C) (Oral)   Ht '6\' 3"'  (1.905 m)   Wt 234 lb 4 oz (106.3 kg)   BMI 29.28 kg/m   BP Readings from Last 3 Encounters:  12/22/18 99/64  05/31/18 109/72  02/22/18 114/72    Wt Readings from Last 3 Encounters:  12/22/18 234 lb 4 oz (106.3 kg)  05/31/18 227 lb (103 kg)  05/13/18 231 lb 2 oz (104.8 kg)     Physical Exam Constitutional:      Appearance: He is well-developed.  HENT:     Head: Normocephalic and atraumatic.  Eyes:     Pupils: Pupils are equal, round, and reactive to light.  Neck:     Musculoskeletal: Normal range of motion.     Thyroid: No thyromegaly.     Trachea: No tracheal deviation.  Cardiovascular:     Rate and Rhythm: Normal rate and regular rhythm.     Heart sounds: Normal heart sounds. No murmur. No friction rub. No gallop.   Pulmonary:     Breath sounds: Normal breath sounds. No wheezing or rales.  Abdominal:     General: Bowel sounds  are normal. There is no distension.     Palpations: Abdomen is soft. There is no mass.     Tenderness: There is no abdominal tenderness.     Hernia: There is no hernia in the right inguinal area or left inguinal area.  Genitourinary:    Penis: Normal.      Scrotum/Testes: Normal.  Musculoskeletal: Normal range of motion.  Lymphadenopathy:     Cervical: No cervical adenopathy.  Skin:     General: Skin is warm and dry.  Neurological:     Mental Status: He is alert and oriented to person, place, and time.       Assessment & Plan:   Azariel was seen today for annual exam.  Diagnoses and all orders for this visit:  Well adult exam -     CBC with Differential/Platelet -     CMP14+EGFR -     Lipid panel -     VITAMIN D 25 Hydroxy (Vit-D Deficiency, Fractures) -     PSA, total and free -     Urinalysis  Special screening for malignant neoplasm of prostate -     PSA, total and free  Mixed hyperlipidemia -     Lipid panel  Rotator cuff syndrome of right shoulder -     Ambulatory referral to Orthopedic Surgery  Other orders -     nabumetone (RELAFEN) 500 MG tablet; Take 2 tablets (1,000 mg total) by mouth 2 (two) times daily. For muscle and joint pain       I am having Jerren L. Tauzin start on nabumetone. I am also having him maintain his Vitamin D, Multiple Vitamin, and atorvastatin. We will continue to administer sodium chloride.  Allergies as of 12/22/2018   No Known Allergies     Medication List       Accurate as of December 22, 2018 11:59 PM. Always use your most recent med list.        atorvastatin 40 MG tablet Commonly known as:  LIPITOR TAKE 1 TABLET (40 MG TOTAL) BY MOUTH DAILY. FOR CHOLESTEROL   Multiple Vitamin tablet Take 1 tablet by mouth daily.   nabumetone 500 MG tablet Commonly known as:  RELAFEN Take 2 tablets (1,000 mg total) by mouth 2 (two) times daily. For muscle and joint pain   Vitamin D 50 MCG (2000 UT) tablet Take 1 tablet (2,000 Units total) by mouth daily.        Follow-up: Return in about 6 months (around 06/22/2019).  Claretta Fraise, M.D.

## 2018-12-23 LAB — CMP14+EGFR
ALBUMIN: 4.5 g/dL (ref 3.8–4.8)
ALK PHOS: 100 IU/L (ref 39–117)
ALT: 15 IU/L (ref 0–44)
AST: 15 IU/L (ref 0–40)
Albumin/Globulin Ratio: 2.6 — ABNORMAL HIGH (ref 1.2–2.2)
BILIRUBIN TOTAL: 0.5 mg/dL (ref 0.0–1.2)
BUN/Creatinine Ratio: 13 (ref 10–24)
BUN: 11 mg/dL (ref 8–27)
CHLORIDE: 102 mmol/L (ref 96–106)
CO2: 25 mmol/L (ref 20–29)
CREATININE: 0.84 mg/dL (ref 0.76–1.27)
Calcium: 9.1 mg/dL (ref 8.6–10.2)
GFR, EST AFRICAN AMERICAN: 105 mL/min/{1.73_m2} (ref >59)
GFR, EST NON AFRICAN AMERICAN: 91 mL/min/{1.73_m2} (ref >59)
GLOBULIN, TOTAL: 1.7 g/dL (ref 1.5–4.5)
Glucose: 87 mg/dL (ref 65–99)
Potassium: 4.6 mmol/L (ref 3.5–5.2)
Sodium: 142 mmol/L (ref 134–144)
Total Protein: 6.2 g/dL (ref 6.0–8.5)

## 2018-12-23 LAB — LIPID PANEL
CHOLESTEROL TOTAL: 151 mg/dL (ref 100–199)
Chol/HDL Ratio: 3.4 ratio (ref 0.0–5.0)
HDL: 45 mg/dL (ref >39)
LDL CALC: 90 mg/dL (ref 0–99)
Triglycerides: 80 mg/dL (ref 0–149)
VLDL CHOLESTEROL CAL: 16 mg/dL (ref 5–40)

## 2018-12-23 LAB — CBC WITH DIFFERENTIAL/PLATELET
BASOS: 0 %
Basophils Absolute: 0.1 10*3/uL (ref 0.0–0.2)
EOS (ABSOLUTE): 0.1 10*3/uL (ref 0.0–0.4)
EOS: 1 %
HEMATOCRIT: 42.8 % (ref 37.5–51.0)
HEMOGLOBIN: 14.4 g/dL (ref 13.0–17.7)
Immature Grans (Abs): 0 10*3/uL (ref 0.0–0.1)
Immature Granulocytes: 0 %
Lymphocytes Absolute: 9.3 10*3/uL — ABNORMAL HIGH (ref 0.7–3.1)
Lymphs: 57 %
MCH: 30.6 pg (ref 26.6–33.0)
MCHC: 33.6 g/dL (ref 31.5–35.7)
MCV: 91 fL (ref 79–97)
MONOS ABS: 1.5 10*3/uL — AB (ref 0.1–0.9)
Monocytes: 9 %
Neutrophils Absolute: 5.3 10*3/uL (ref 1.4–7.0)
Neutrophils: 33 %
Platelets: 142 10*3/uL — ABNORMAL LOW (ref 150–450)
RBC: 4.7 x10E6/uL (ref 4.14–5.80)
RDW: 12.6 % (ref 11.6–15.4)
WBC: 16.2 10*3/uL — AB (ref 3.4–10.8)

## 2018-12-23 LAB — VITAMIN D 25 HYDROXY (VIT D DEFICIENCY, FRACTURES): Vit D, 25-Hydroxy: 21.5 ng/mL — ABNORMAL LOW (ref 30.0–100.0)

## 2018-12-23 LAB — PSA, TOTAL AND FREE
PSA, Free Pct: 22.5 %
PSA, Free: 0.18 ng/mL
Prostate Specific Ag, Serum: 0.8 ng/mL (ref 0.0–4.0)

## 2018-12-24 ENCOUNTER — Other Ambulatory Visit: Payer: Self-pay | Admitting: *Deleted

## 2018-12-24 DIAGNOSIS — D72828 Other elevated white blood cell count: Secondary | ICD-10-CM

## 2018-12-24 MED ORDER — ERGOCALCIFEROL 1.25 MG (50000 UT) PO CAPS: 50000.0000 [IU] | ORAL_CAPSULE | ORAL | 1 refills | Status: DC

## 2018-12-26 ENCOUNTER — Encounter: Payer: Self-pay | Admitting: Family Medicine

## 2018-12-28 ENCOUNTER — Ambulatory Visit (INDEPENDENT_AMBULATORY_CARE_PROVIDER_SITE_OTHER): Payer: Medicare HMO

## 2018-12-28 ENCOUNTER — Other Ambulatory Visit (INDEPENDENT_AMBULATORY_CARE_PROVIDER_SITE_OTHER): Payer: Self-pay

## 2018-12-28 ENCOUNTER — Ambulatory Visit (INDEPENDENT_AMBULATORY_CARE_PROVIDER_SITE_OTHER): Payer: Medicare HMO | Admitting: Orthopaedic Surgery

## 2018-12-28 ENCOUNTER — Encounter (INDEPENDENT_AMBULATORY_CARE_PROVIDER_SITE_OTHER): Payer: Self-pay | Admitting: Orthopaedic Surgery

## 2018-12-28 DIAGNOSIS — G8929 Other chronic pain: Secondary | ICD-10-CM

## 2018-12-28 DIAGNOSIS — M25511 Pain in right shoulder: Principal | ICD-10-CM

## 2018-12-28 MED ORDER — METHYLPREDNISOLONE ACETATE 40 MG/ML IJ SUSP
40.0000 mg | INTRAMUSCULAR | Status: AC | PRN
  Administered 2018-12-28: 40 mg via INTRA_ARTICULAR

## 2018-12-28 MED ORDER — LIDOCAINE HCL 1 % IJ SOLN
3.0000 mL | INTRAMUSCULAR | Status: AC | PRN
  Administered 2018-12-28: 3 mL

## 2018-12-28 NOTE — Progress Notes (Signed)
Office Visit Note   Patient: Travis Wood           Date of Birth: 1951/06/16           MRN: [id number] Visit Date: 12/28/2018              Requested by: Claretta Fraise, MD [place], [place] [address] PCP: Claretta Fraise, MD   Assessment & Plan: Visit Diagnoses:  1. Chronic right shoulder pain     Plan: I am concerned about the possibility of a rotator cuff tear of his shoulder based on how he abducts his shoulder using some of his deltoids.  I did provide a steroid injection in the subacromial space to give him some relief but due to the continued weakness of his shoulder combined with a failure of conservative treatment including anti-inflammatories, activity modification, rest, time, injections and formal physical therapy, an MRI is warranted to rule out a rotator cuff tear and to help [place] make appropriate decisions as to the next treatment modalities for his right shoulder.  All questions concerns were answered and addressed.  We will see him back in about 2 weeks to go over the MRI of his right shoulder.  Follow-Up Instructions: Return in about 2 weeks (around 01/11/2019).   Orders:  Orders Placed This Encounter  Procedures  . Large Joint Inj  . XR Shoulder Right   No orders of the defined types were placed in this encounter.     Procedures: Large Joint Inj: R subacromial bursa on 12/28/2018 10:35 AM Indications: pain and diagnostic evaluation Details: 22 G 1.5 in needle  Arthrogram: No  Medications: 3 mL lidocaine 1 %; 40 mg methylPREDNISolone acetate 40 MG/ML Outcome: tolerated well, no immediate complications Procedure, treatment alternatives, risks and benefits explained, specific risks discussed. Consent was given by the patient. Immediately prior to procedure a time out was called to verify the correct patient, procedure, equipment, support staff and site/side marked as required. Patient was prepped and draped in the usual sterile fashion.        Clinical Data: No additional findings.   Subjective: Chief Complaint  Patient presents with  . Right Shoulder - Pain  Patient is a very active 68 year old right-hand-dominant male with over a year history of worsening right shoulder pain.  He denies any specific injury.  He denies any numbness and tingling in his hand or neck pain.  He says he cannot raise her arm above his shoulder well.  He has a remote history last year of a steroid injection in the right shoulder and that he went through outpatient physical therapy and he said things were better for a while.  He is an avid golfer and he says it really hurts with reaching behind him and overhead.  He says activities in front of them causes no issues.  He is not a diabetic.  HPI  Review of Systems He currently denies any headache, chest pain, short of breath, fever, chills, nausea, vomiting.  Objective: Vital Signs: There were no vitals taken for this visit.  Physical Exam He is alert and orient x3 and in no acute distress Ortho Exam Examination of his right shoulder shows positive Neer and Hawkins signs.  He has a negative liftoff.  His range of motion is full but very painful.  The shoulder is well located. Specialty Comments:  No specialty comments available.  Imaging: Xr Shoulder Right  Result Date: 12/28/2018 3 views of the right shoulder show no  acute findings.  The shoulder is well located but on the [facility] view the humeral head is slightly high riding.  There is loss in the subacromial outlet as well.    PMFS History: Patient Active Problem List   Diagnosis Date Noted  . Eustachian tube dysfunction 12/14/2015  . Hyperlipidemia 06/13/2015   Past Medical History:  Diagnosis Date  . Cataract   . Erectile dysfunction   . History of bronchitis   . History of colon polyps   . Hyperlipemia   . Osteoarthritis   . Shortness of breath dyspnea     Family History  Problem Relation Age of Onset  . Colon cancer  Father        age 3   . Colon polyps Father   . Heart disease Father        Heart failure  . Heart disease Mother   . Cancer Sister        Uterine, and ovarian  . Esophageal cancer Neg Hx   . Liver cancer Neg Hx   . Pancreatic cancer Neg Hx   . Rectal cancer Neg Hx   . Stomach cancer Neg Hx     Past Surgical History:  Procedure Laterality Date  . APPENDECTOMY    . CATARACT EXTRACTION W/PHACO Left 10/15/2015   Procedure: CATARACT EXTRACTION PHACO AND INTRAOCULAR LENS PLACEMENT LEFT EYE;  Surgeon: Tonny Branch, MD;  Location: [facility];  Service: Ophthalmology;  Laterality: Left;  CDE:12.20  . CATARACT EXTRACTION W/PHACO Right 11/12/2015   Procedure: CATARACT EXTRACTION PHACO AND INTRAOCULAR LENS PLACEMENT RIGHT EYE:  CDE:  8.99;  Surgeon: Tonny Branch, MD;  Location: [facility];  Service: Ophthalmology;  Laterality: Right;  . KNEE SURGERY     Left x 2   . MASS EXCISION N/A 09/12/2015   Procedure: EXCISION SOFT TISSUE NEOPLASM (6 CM), BACK;  Surgeon: Aviva Signs, MD;  Location: [facility];  Service: General;  Laterality: N/A;   Social History   Occupational History  . 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 Smoking status: Former Smoker    Packs/day: 1.00    Years: 5.00    Pack years: 5.00    Types: Cigarettes    Last attempt to quit: 09/10/1991    Years since quitting: 27.3  . Smokeless tobacco: Never Used  Substance and Sexual Activity  . Alcohol use: Yes    Alcohol/week: 1.0 standard drinks    Types: 1 Cans of beer per week  . Drug use: Yes    Frequency: 1.0 times per week    Types: Marijuana  . Sexual activity: Yes

## 2018-12-31 ENCOUNTER — Encounter: Payer: Self-pay | Admitting: *Deleted

## 2019-01-05 ENCOUNTER — Other Ambulatory Visit: Payer: Medicare HMO

## 2019-01-05 DIAGNOSIS — D72828 Other elevated white blood cell count: Secondary | ICD-10-CM

## 2019-01-06 LAB — CBC WITH DIFFERENTIAL/PLATELET
Basophils Absolute: 0.1 10*3/uL (ref 0.0–0.2)
Basos: 0 %
EOS (ABSOLUTE): 0.1 10*3/uL (ref 0.0–0.4)
Eos: 1 %
Hematocrit: 41.7 % (ref 37.5–51.0)
Hemoglobin: 14.2 g/dL (ref 13.0–17.7)
Immature Grans (Abs): 0.1 10*3/uL (ref 0.0–0.1)
Immature Granulocytes: 1 %
Lymphocytes Absolute: 14 10*3/uL — ABNORMAL HIGH (ref 0.7–3.1)
Lymphs: 66 %
MCH: 30.6 pg (ref 26.6–33.0)
MCHC: 34.1 g/dL (ref 31.5–35.7)
MCV: 90 fL (ref 79–97)
MONOS ABS: 0.7 10*3/uL (ref 0.1–0.9)
Monocytes: 4 %
Neutrophils Absolute: 5.8 10*3/uL (ref 1.4–7.0)
Neutrophils: 28 %
Platelets: 154 10*3/uL (ref 150–450)
RBC: 4.64 x10E6/uL (ref 4.14–5.80)
RDW: 12.9 % (ref 11.6–15.4)
WBC: 20.9 10*3/uL (ref 3.4–10.8)

## 2019-01-07 ENCOUNTER — Other Ambulatory Visit: Payer: Self-pay | Admitting: Family Medicine

## 2019-01-07 DIAGNOSIS — D72820 Lymphocytosis (symptomatic): Secondary | ICD-10-CM

## 2019-01-11 ENCOUNTER — Ambulatory Visit (INDEPENDENT_AMBULATORY_CARE_PROVIDER_SITE_OTHER): Payer: Medicare HMO | Admitting: Orthopaedic Surgery

## 2019-01-12 ENCOUNTER — Ambulatory Visit
Admission: RE | Admit: 2019-01-12 | Discharge: 2019-01-12 | Disposition: A | Discharge status: Home / Self Care | Payer: Medicare HMO | Source: Ambulatory Visit | Attending: Orthopaedic Surgery | Admitting: Orthopaedic Surgery

## 2019-01-12 DIAGNOSIS — M25511 Pain in right shoulder: Secondary | ICD-10-CM | POA: Diagnosis not present

## 2019-01-12 DIAGNOSIS — G8929 Other chronic pain: Secondary | ICD-10-CM

## 2019-01-17 ENCOUNTER — Other Ambulatory Visit: Payer: Self-pay | Admitting: Family Medicine

## 2019-01-18 ENCOUNTER — Ambulatory Visit (HOSPITAL_COMMUNITY): Payer: Medicare HMO | Admitting: Internal Medicine

## 2019-01-24 ENCOUNTER — Ambulatory Visit (INDEPENDENT_AMBULATORY_CARE_PROVIDER_SITE_OTHER): Payer: Medicare HMO | Admitting: Orthopaedic Surgery

## 2019-01-24 ENCOUNTER — Encounter (INDEPENDENT_AMBULATORY_CARE_PROVIDER_SITE_OTHER): Payer: Self-pay | Admitting: Orthopaedic Surgery

## 2019-01-24 DIAGNOSIS — M25511 Pain in right shoulder: Secondary | ICD-10-CM

## 2019-01-24 DIAGNOSIS — G8929 Other chronic pain: Secondary | ICD-10-CM | POA: Diagnosis not present

## 2019-01-24 NOTE — Progress Notes (Signed)
The patient is coming in today to go over an MRI of his right shoulder.  He has had chronic shoulder issues and weakness in the shoulder.  He is an avid Air cabin crew.  He has never had a steroid shots I did place a steroid injection subacromial space and got an MRI of the shoulder.  He says the shoulder injections help quite a bit and his mobility is better overall.  On examination of his right shoulder I agree that his weakness is less and is moving his shoulder above his head much easier.  He still has positive Neer and Hawkins signs but he mobility and function the shoulder seems to be improved.  MRI does show tendinosis of the rotator cuff and a type II acromion with moderate AC joint arthritis.  There is no muscle atrophy and no tear of the tendon itself.  There is only mild amount of bursitis.  At this point we will hold off on any type of intervention such as surgery given the fact that the injection has helped.  I would always consider repeat injection if things flareup for him again and we could do this in April if he needs it.  All question concerns were answered and addressed.  I gave him a copy of his MRI report.

## 2019-01-26 ENCOUNTER — Inpatient Hospital Stay (HOSPITAL_COMMUNITY): Payer: Medicare HMO

## 2019-01-26 ENCOUNTER — Encounter (HOSPITAL_COMMUNITY): Payer: Self-pay | Admitting: Internal Medicine

## 2019-01-26 ENCOUNTER — Inpatient Hospital Stay (HOSPITAL_COMMUNITY): Payer: Medicare HMO | Attending: Internal Medicine | Admitting: Internal Medicine

## 2019-01-26 VITALS — BP 101/63 | HR 78 | Temp 97.8°F | Resp 18 | Wt 229.0 lb

## 2019-01-26 DIAGNOSIS — M25559 Pain in unspecified hip: Secondary | ICD-10-CM

## 2019-01-26 DIAGNOSIS — Z87891 Personal history of nicotine dependence: Secondary | ICD-10-CM | POA: Diagnosis not present

## 2019-01-26 DIAGNOSIS — D72828 Other elevated white blood cell count: Secondary | ICD-10-CM | POA: Diagnosis not present

## 2019-01-26 DIAGNOSIS — D72829 Elevated white blood cell count, unspecified: Secondary | ICD-10-CM | POA: Diagnosis not present

## 2019-01-26 DIAGNOSIS — K047 Periapical abscess without sinus: Secondary | ICD-10-CM | POA: Diagnosis not present

## 2019-01-26 DIAGNOSIS — Z79899 Other long term (current) drug therapy: Secondary | ICD-10-CM

## 2019-01-26 LAB — CBC WITH DIFFERENTIAL/PLATELET
Abs Immature Granulocytes: 0.03 10*3/uL (ref 0.00–0.07)
Basophils Absolute: 0 10*3/uL (ref 0.0–0.1)
Basophils Relative: 0 %
Eosinophils Absolute: 0.1 10*3/uL (ref 0.0–0.5)
Eosinophils Relative: 0 %
HEMATOCRIT: 42.2 % (ref 39.0–52.0)
HEMOGLOBIN: 13.8 g/dL (ref 13.0–17.0)
Immature Granulocytes: 0 %
LYMPHS ABS: 7.7 10*3/uL — AB (ref 0.7–4.0)
Lymphocytes Relative: 56 %
MCH: 30.9 pg (ref 26.0–34.0)
MCHC: 32.7 g/dL (ref 30.0–36.0)
MCV: 94.4 fL (ref 80.0–100.0)
Monocytes Absolute: 1.5 10*3/uL — ABNORMAL HIGH (ref 0.1–1.0)
Monocytes Relative: 11 %
Neutro Abs: 4.5 10*3/uL (ref 1.7–7.7)
Neutrophils Relative %: 33 %
Platelets: 141 10*3/uL — ABNORMAL LOW (ref 150–400)
RBC: 4.47 MIL/uL (ref 4.22–5.81)
RDW: 12.9 % (ref 11.5–15.5)
WBC: 13.8 10*3/uL — ABNORMAL HIGH (ref 4.0–10.5)
nRBC: 0 % (ref 0.0–0.2)

## 2019-01-26 LAB — COMPREHENSIVE METABOLIC PANEL
ALK PHOS: 88 U/L (ref 38–126)
ALT: 23 U/L (ref 0–44)
AST: 21 U/L (ref 15–41)
Albumin: 4.1 g/dL (ref 3.5–5.0)
Anion gap: 5 (ref 5–15)
BILIRUBIN TOTAL: 0.5 mg/dL (ref 0.3–1.2)
BUN: 17 mg/dL (ref 8–23)
CALCIUM: 8.8 mg/dL — AB (ref 8.9–10.3)
CO2: 26 mmol/L (ref 22–32)
Chloride: 106 mmol/L (ref 98–111)
Creatinine, Ser: 0.81 mg/dL (ref 0.61–1.24)
GFR calc Af Amer: >60 mL/min (ref >60)
Glucose, Bld: 94 mg/dL (ref 70–99)
Potassium: 4 mmol/L (ref 3.5–5.1)
Sodium: 137 mmol/L (ref 135–145)
Total Protein: 6.4 g/dL — ABNORMAL LOW (ref 6.5–8.1)

## 2019-01-26 LAB — LACTATE DEHYDROGENASE: LDH: 123 U/L (ref 98–192)

## 2019-01-26 LAB — SEDIMENTATION RATE: Sed Rate: 1 mm/hr (ref 0–16)

## 2019-01-26 LAB — C-REACTIVE PROTEIN: CRP: <0.8 mg/dL (ref <1.0)

## 2019-01-26 NOTE — Progress Notes (Signed)
Referring Physician:  Dr. Livia Snellen, [facility]  Diagnosis Other elevated white blood cell (WBC) count - Plan: CBC with Differential, Comprehensive metabolic panel, Lactate dehydrogenase, BCR-ABL1 FISH, Sedimentation rate, C-reactive protein, Rheumatoid factor, ANA, IFA (with reflex), Draw extra clot tube, Protein electrophoresis, serum, CANCELED: Jak 2 V617F ([employer])  Pain in joint involving pelvic region and thigh, unspecified laterality - Plan: CBC with Differential, Comprehensive metabolic panel, Lactate dehydrogenase, BCR-ABL1 FISH, Sedimentation rate, C-reactive protein, Rheumatoid factor, ANA, IFA (with reflex), Draw extra clot tube, Protein electrophoresis, serum, CANCELED: Jak 2 V617F ([employer])  Tooth infection - Plan: CBC with Differential, Comprehensive metabolic panel, Lactate dehydrogenase, BCR-ABL1 FISH, Sedimentation rate, C-reactive protein, Rheumatoid factor, ANA, IFA (with reflex), Draw extra clot tube, Protein electrophoresis, serum, CANCELED: Jak 2 V617F ([employer])  Staging Cancer Staging No matching staging information was found for the patient.  Assessment and Plan:  1.  Leucocytosis.  68 year old Travis [name] referred for evaluation due to leucocytosis.  Pt reports problems with cavities dating back to 12/2018.  He reports tooth extraction approx 3 weeks ago and was treated with abx.  He has other cavities that need to be removed.  Labs done 12/22/2018 showed WBC 16.2 HB 14.4 plts 142,000.  He had 57% lymphocytes.  Chemistries WNL with K+ 4.6 Cr 0.84 and normal LFTs.  Labs done 01/05/2019 showed WBC 20.9 HB 14.2 plts 154,000.  Pt had Travis% lymphocytes.  Pt denies any fevers, chills, night sweats, weight loss and has noted no adenopathy.  He has completed abx for tooth infection.  He denies family history of leukemias or lymphomas.  Pt is seen today for consultation due to leukocytosis.   Labs done today 01/26/2019 reviewed and showed WBC 13.8 HB 13.8 plts 141,000.  Smear  review shows 56% lymphocytes.  Chemistries WNL with K+ 4 Cr 0.81 and normal LFTs.  LDH 123.  Awaiting results of flow cytometry, BCR-ABL1 FISH, Sedimentation rate, C-reactive protein, Rheumatoid factor, ANA, IFA (with reflex), Protein electrophoresis, Jak 2.    Pt is asymptomatic.  He will RTC in 2 weeks to go over results.    2.  Joint pain.  Awaiting results of Sedimentation rate, C-reactive protein, Rheumatoid factor, ANA, IFA (with reflex), SPEP.    3.  Tooth infection. Pt reports he had tooth extraction and completed abx 3 weeks ago.  He has other cavities. Pt afebrile in clinic.  WBC slightly improved at 14,000.  He has elevated lymphocyte count.  Flow cytometry pending.  Follow-up with dentist as recommended.    4. Health maintenance.  Pt had colonoscopy done 02/22/2018 that showed:   Impression - Four 2 to 5 mm polyps in the transverse colon and in the ascending colon, removed with a cold snare. Resected and retrieved. - Diverticulosis in the sigmoid colon. - Internal hemorrhoids.  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    40 minutes spent with more than 50% in review of records, counseling and coordination of care.    HPI:  68 year old Travis [name] referred for evaluation due to leucocytosis.  Pt reports problems with cavities dating back to 12/2018.  He reports tooth extraction approx 3 weeks ago and was treated with abx.  He has other cavities that need to be removed.  Labs done 12/22/2018 showed WBC 16.2 HB 14.4 plts 142,000.  He had 57% lymphocytes.  Chemistries WNL with K+ 4.6 Cr 0.84 and normal LFTs.  Labs done 01/05/2019 showed WBC 20.9 HB  14.2 plts 154,000.  Pt had Travis% lymphocytes.  Pt denies any fevers, chills, night sweats, weight loss and has noted no adenopathy.  He has completed abx for tooth infection.  He denies family history of leukemias or lymphomas.  Pt is seen today for consultation due to leukocytosis.   Problem  List Patient Active Problem List   Diagnosis Date Noted  . Eustachian tube dysfunction [H69.80] 12/14/2015  . Hyperlipidemia [E78.5] 06/13/2015    Past Medical History Past Medical History:  Diagnosis Date  . Cataract   . Erectile dysfunction   . History of bronchitis   . History of colon polyps   . Hyperlipemia   . Osteoarthritis   . Shortness of breath dyspnea     Past Surgical History Past Surgical History:  Procedure Laterality Date  . APPENDECTOMY    . CATARACT EXTRACTION W/PHACO Left 10/15/2015   Procedure: CATARACT EXTRACTION PHACO AND INTRAOCULAR LENS PLACEMENT LEFT EYE;  Surgeon: Tonny Branch, MD;  Location: [facility];  Service: Ophthalmology;  Laterality: Left;  CDE:12.20  . CATARACT EXTRACTION W/PHACO Right 11/12/2015   Procedure: CATARACT EXTRACTION PHACO AND INTRAOCULAR LENS PLACEMENT RIGHT EYE:  CDE:  8.99;  Surgeon: Tonny Branch, MD;  Location: [facility];  Service: Ophthalmology;  Laterality: Right;  . KNEE SURGERY     Left x 2   . MASS EXCISION N/A 09/12/2015   Procedure: EXCISION SOFT TISSUE NEOPLASM (6 CM), BACK;  Surgeon: Aviva Signs, MD;  Location: [facility];  Service: General;  Laterality: N/A;    Family History Family History  Problem Relation Age of Onset  . Colon cancer Father        age 68   . Colon polyps Father   . Heart disease Father        Heart failure  . Heart disease Mother   . Cancer Sister        Uterine, and ovarian  . Esophageal cancer Neg Hx   . Liver cancer Neg Hx   . Pancreatic cancer Neg Hx   . Rectal cancer Neg Hx   . Stomach cancer Neg Hx      Social History  reports that he quit smoking about 27 years ago. His smoking use included cigarettes. He has a 5.00 pack-year smoking history. He has never used smokeless tobacco. He reports current alcohol use of about 1.0 standard drinks of alcohol per week. He reports current drug use. Frequency: 1.00 time per week. Drug: Marijuana.  Medications  Current Outpatient Medications:  .   atorvastatin (LIPITOR) 40 MG tablet, TAKE 1 TABLET (40 MG TOTAL) BY MOUTH DAILY. FOR CHOLESTEROL, Disp: 90 tablet, Rfl: 1 .  Cholecalciferol (VITAMIN D) 2000 UNITS tablet, Take 1 tablet (2,000 Units total) by mouth daily., Disp: 30 tablet, Rfl: 11 .  Multiple Vitamin tablet, Take 1 tablet by mouth daily., Disp: , Rfl:  .  nabumetone (RELAFEN) 500 MG tablet, Take 2 tablets (1,000 mg total) by mouth 2 (two) times daily. For muscle and joint pain, Disp: 360 tablet, Rfl: 1  Current Facility-Administered Medications:  .  0.9 %  sodium chloride infusion, 500 mL, Intravenous, Once, Irene Shipper, MD  Allergies Patient has no known allergies.  Review of Systems Review of Systems - Oncology ROS negative   Physical Exam  Vitals Wt Readings from Last 3 Encounters:  01/26/19 229 lb (103.9 kg)  12/22/18 234 lb 4 oz (106.3 kg)  05/31/18 227 lb (103 kg)   Temp Readings from Last 3 Encounters:  01/26/19 97.8 F (36.6 C) (Oral)  12/22/18 (!) 97.3 F (36.3 C) (Oral)  05/13/18 97.6 F (36.4 C) (Oral)   BP Readings from Last 3 Encounters:  01/26/19 101/63  12/22/18 99/64  05/31/18 109/72   Pulse Readings from Last 3 Encounters:  01/26/19 78  12/22/18 (!) 58  05/31/18 65   Constitutional: Well-developed, well-nourished, and in no distress.   HENT: Head: Normocephalic and atraumatic.  Mouth/Throat: No oropharyngeal exudate. Mucosa moist. Eyes: Pupils are equal, round, and reactive to light. Conjunctivae are normal. No scleral icterus.  Neck: Normal range of motion. Neck supple. No JVD present.  Cardiovascular: Normal rate, regular rhythm and normal heart sounds.  Exam reveals no gallop and no friction rub.   No murmur heard. Pulmonary/Chest: Effort normal and breath sounds normal. No respiratory distress. No wheezes.No rales.  Abdominal: Soft. Bowel sounds are normal. No distension. There is no tenderness. There is no guarding.  Musculoskeletal: No edema or tenderness.  Lymphadenopathy:  No cervical,axillary or supraclavicular adenopathy.  Neurological: Alert and oriented to person, place, and time. No cranial nerve deficit.  Skin: Skin is warm and dry. No rash noted. No erythema. No pallor.  Psychiatric: Affect and judgment normal.   Labs Appointment on 01/26/2019  Component Date Value Ref Range Status  . WBC 01/26/2019 13.8* 4.0 - 10.5 K/uL Final  . RBC 01/26/2019 4.47  4.22 - 5.81 MIL/uL Final  . Hemoglobin 01/26/2019 13.8  13.0 - 17.0 g/dL Final  . HCT 01/26/2019 42.2  39.0 - 52.0 % Final  . MCV 01/26/2019 94.4  80.0 - 100.0 fL Final  . MCH 01/26/2019 30.9  26.0 - 34.0 pg Final  . MCHC 01/26/2019 32.7  30.0 - 36.0 g/dL Final  . RDW 01/26/2019 12.9  11.5 - 15.5 % Final  . Platelets 01/26/2019 141* 150 - 400 K/uL Final  . nRBC 01/26/2019 0.0  0.0 - 0.2 % Final  . Neutrophils Relative % 01/26/2019 33  % Final  . Neutro Abs 01/26/2019 4.5  1.7 - 7.7 K/uL Final  . Lymphocytes Relative 01/26/2019 56  % Final  . Lymphs Abs 01/26/2019 7.7* 0.7 - 4.0 K/uL Final  . Monocytes Relative 01/26/2019 11  % Final  . Monocytes Absolute 01/26/2019 1.5* 0.1 - 1.0 K/uL Final  . Eosinophils Relative 01/26/2019 0  % Final  . Eosinophils Absolute 01/26/2019 0.1  0.0 - 0.5 K/uL Final  . Basophils Relative 01/26/2019 0  % Final  . Basophils Absolute 01/26/2019 0.0  0.0 - 0.1 K/uL Final  . WBC Morphology 01/26/2019 ABSOLUTE LYMPHOCYTOSIS   Final   Comment: SMUDGE CELLS ATYPICAL LYMPHOCYTES PRESENT PATHOLOGY REVIEW TO FOLLOW   . Immature Granulocytes 01/26/2019 0  % Final  . Abs Immature Granulocytes 01/26/2019 0.03  0.00 - 0.07 K/uL Final   Performed at [facility], [address], [place], [place] [address]  . Sodium 01/26/2019 137  135 - 145 mmol/L Final  . Potassium 01/26/2019 4.0  3.5 - 5.1 mmol/L Final  . Chloride 01/26/2019 106  98 - 111 mmol/L Final  . CO2 01/26/2019 26  22 - 32 mmol/L Final  . Glucose, Bld 01/26/2019 94  70 - 99 mg/dL Final  . BUN 01/26/2019 17  8 - 23 mg/dL  Final  . Creatinine, Ser 01/26/2019 0.81  0.61 - 1.24 mg/dL Final  . Calcium 01/26/2019 8.8* 8.9 - 10.3 mg/dL Final  . Total Protein 01/26/2019 6.4* 6.5 - 8.1 g/dL Final  . Albumin 01/26/2019 4.1  3.5 - 5.0  g/dL Final  . AST 01/26/2019 21  15 - 41 U/L Final  . ALT 01/26/2019 23  0 - 44 U/L Final  . Alkaline Phosphatase 01/26/2019 88  38 - 126 U/L Final  . Total Bilirubin 01/26/2019 0.5  0.3 - 1.2 mg/dL Final  . GFR calc non Af Amer 01/26/2019 >60  >60 mL/min Final  . GFR calc Af Amer 01/26/2019 >60  >60 mL/min Final  . Anion gap 01/26/2019 5  5 - 15 Final   Performed at [facility], [address] [place], [place] [address]  . LDH 01/26/2019 123  98 - 192 U/L Final   Performed at [facility], [address] [place], [place] [address]  . Sed Rate 01/26/2019 1  0 - 16 mm/hr Final   Performed at [facility], [address] [place], [place] [address]     Pathology Orders Placed This Encounter  Procedures  . CBC with Differential    Standing Status:   Future    Number of Occurrences:   1    Standing Expiration Date:   01/27/2020  . Comprehensive metabolic panel    Standing Status:   Future    Number of Occurrences:   1    Standing Expiration Date:   01/27/2020  . Lactate dehydrogenase    Standing Status:   Future    Number of Occurrences:   1    Standing Expiration Date:   01/27/2020  . BCR-ABL1 FISH    Standing Status:   Future    Number of Occurrences:   1    Standing Expiration Date:   01/27/2020  . Sedimentation rate    Standing Status:   Future    Number of Occurrences:   1    Standing Expiration Date:   01/27/2020  . C-reactive protein    Standing Status:   Future    Number of Occurrences:   1    Standing Expiration Date:   01/27/2020  . Rheumatoid factor    Standing Status:   Future    Number of Occurrences:   1    Standing Expiration Date:   01/27/2020  . ANA, IFA (with reflex)    Standing Status:   Future    Number of Occurrences:   1    Standing Expiration Date:    01/27/2020  . Draw extra clot tube    For possible flow cytometry    Standing Status:   Future    Number of Occurrences:   1    Standing Expiration Date:   01/27/2020  . Protein electrophoresis, serum    Standing Status:   Future    Number of Occurrences:   1    Standing Expiration Date:   01/27/2020       Zoila Shutter MD

## 2019-01-27 LAB — ANTINUCLEAR ANTIBODIES, IFA: ANA Ab, IFA: NEGATIVE

## 2019-01-27 LAB — RHEUMATOID FACTOR: Rheumatoid fact SerPl-aCnc: 64.1 IU/mL — ABNORMAL HIGH (ref 0.0–13.9)

## 2019-01-28 LAB — PROTEIN ELECTROPHORESIS, SERUM
A/G Ratio: 1.9 — ABNORMAL HIGH (ref 0.7–1.7)
Albumin ELP: 3.9 g/dL (ref 2.9–4.4)
Alpha-1-Globulin: 0.2 g/dL (ref 0.0–0.4)
Alpha-2-Globulin: 0.5 g/dL (ref 0.4–1.0)
BETA GLOBULIN: 0.9 g/dL (ref 0.7–1.3)
Gamma Globulin: 0.5 g/dL (ref 0.4–1.8)
Globulin, Total: 2.1 g/dL — ABNORMAL LOW (ref 2.2–3.9)
Total Protein ELP: 6 g/dL (ref 6.0–8.5)

## 2019-01-31 LAB — JAK2 GENOTYPR

## 2019-02-03 LAB — BCR-ABL1 FISH
CELLS COUNTED: 200
Cells Analyzed: 200

## 2019-02-09 ENCOUNTER — Ambulatory Visit (HOSPITAL_COMMUNITY): Payer: Medicare HMO | Admitting: Internal Medicine

## 2019-02-17 IMAGING — DX DG KNEE 1-2V*R*
2 series · 2 of 2 positions shown · non-contrast
Comparison: Frontal view of the left knee same day

CLINICAL DATA: Fall 3 weeks ago, right knee swelling

EXAM:
RIGHT KNEE - 1-2 VIEW

[knee ap]
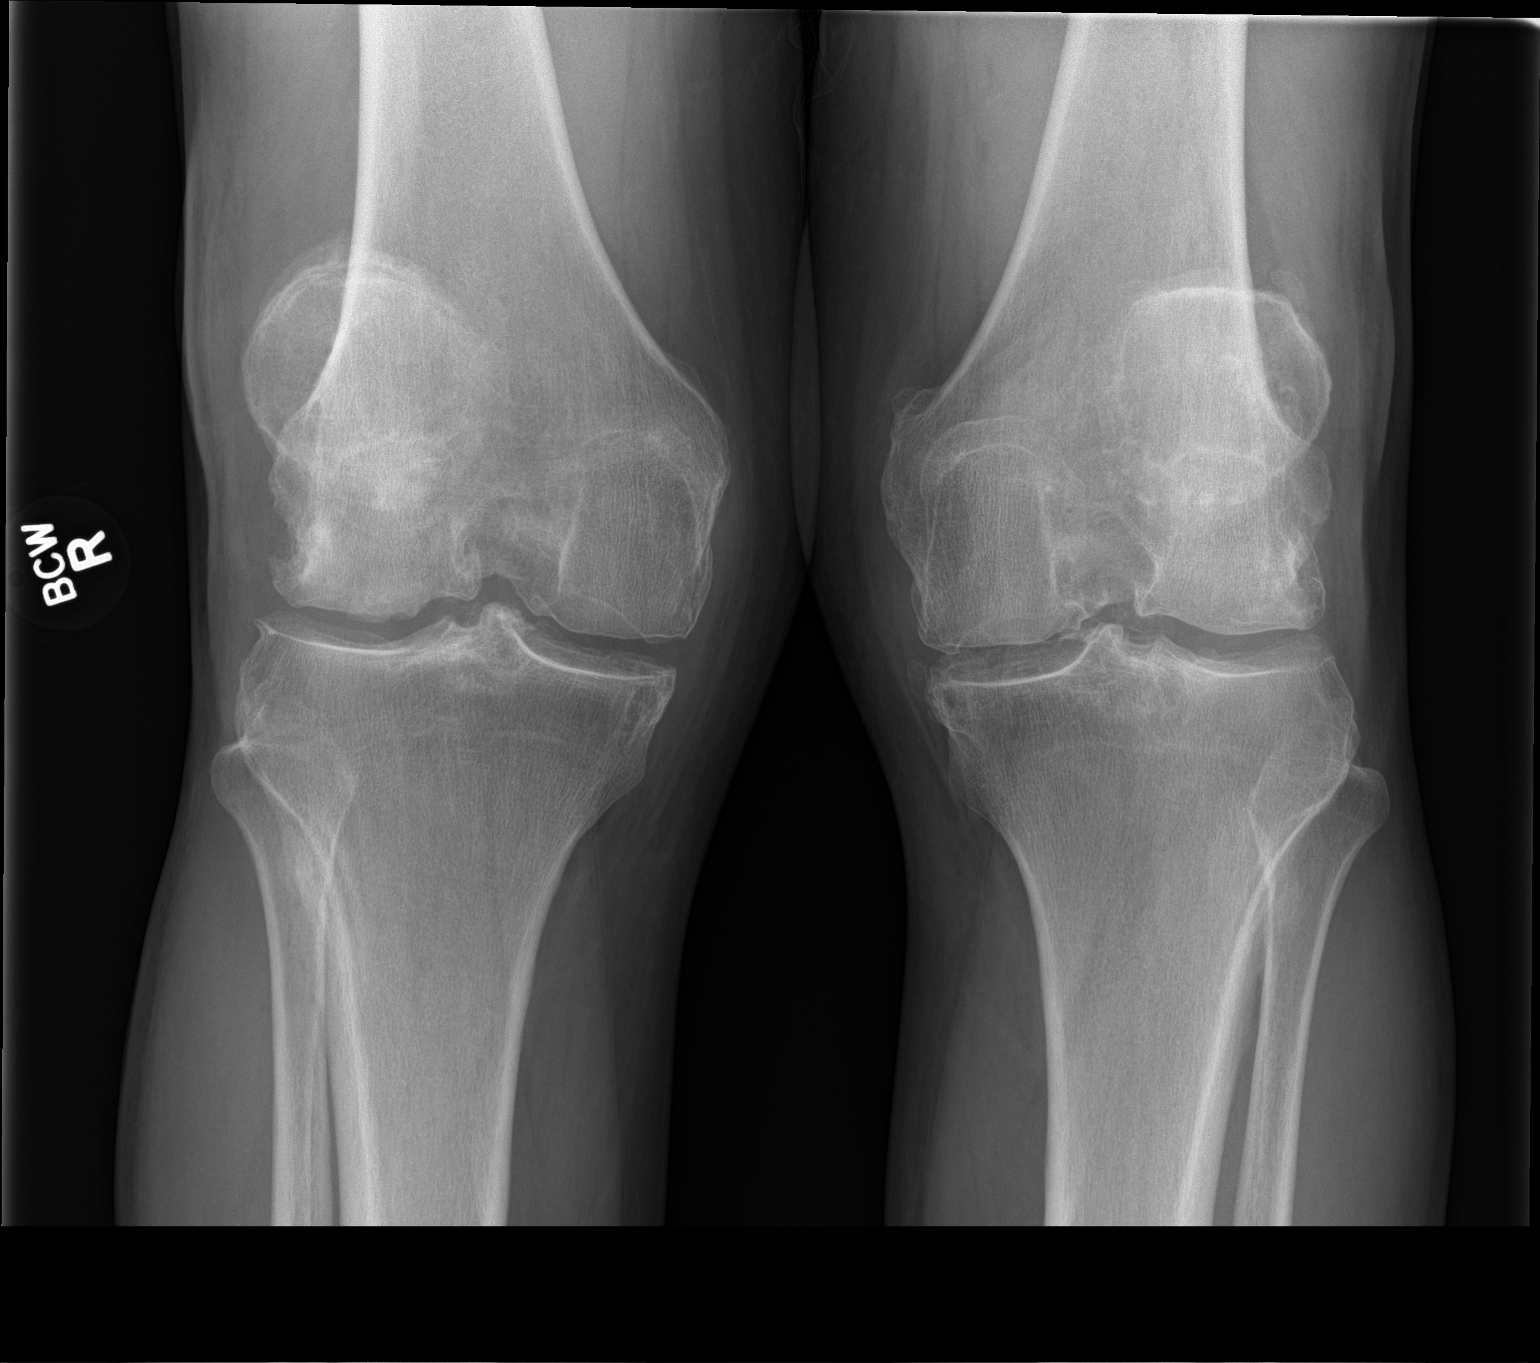

[knee lat]
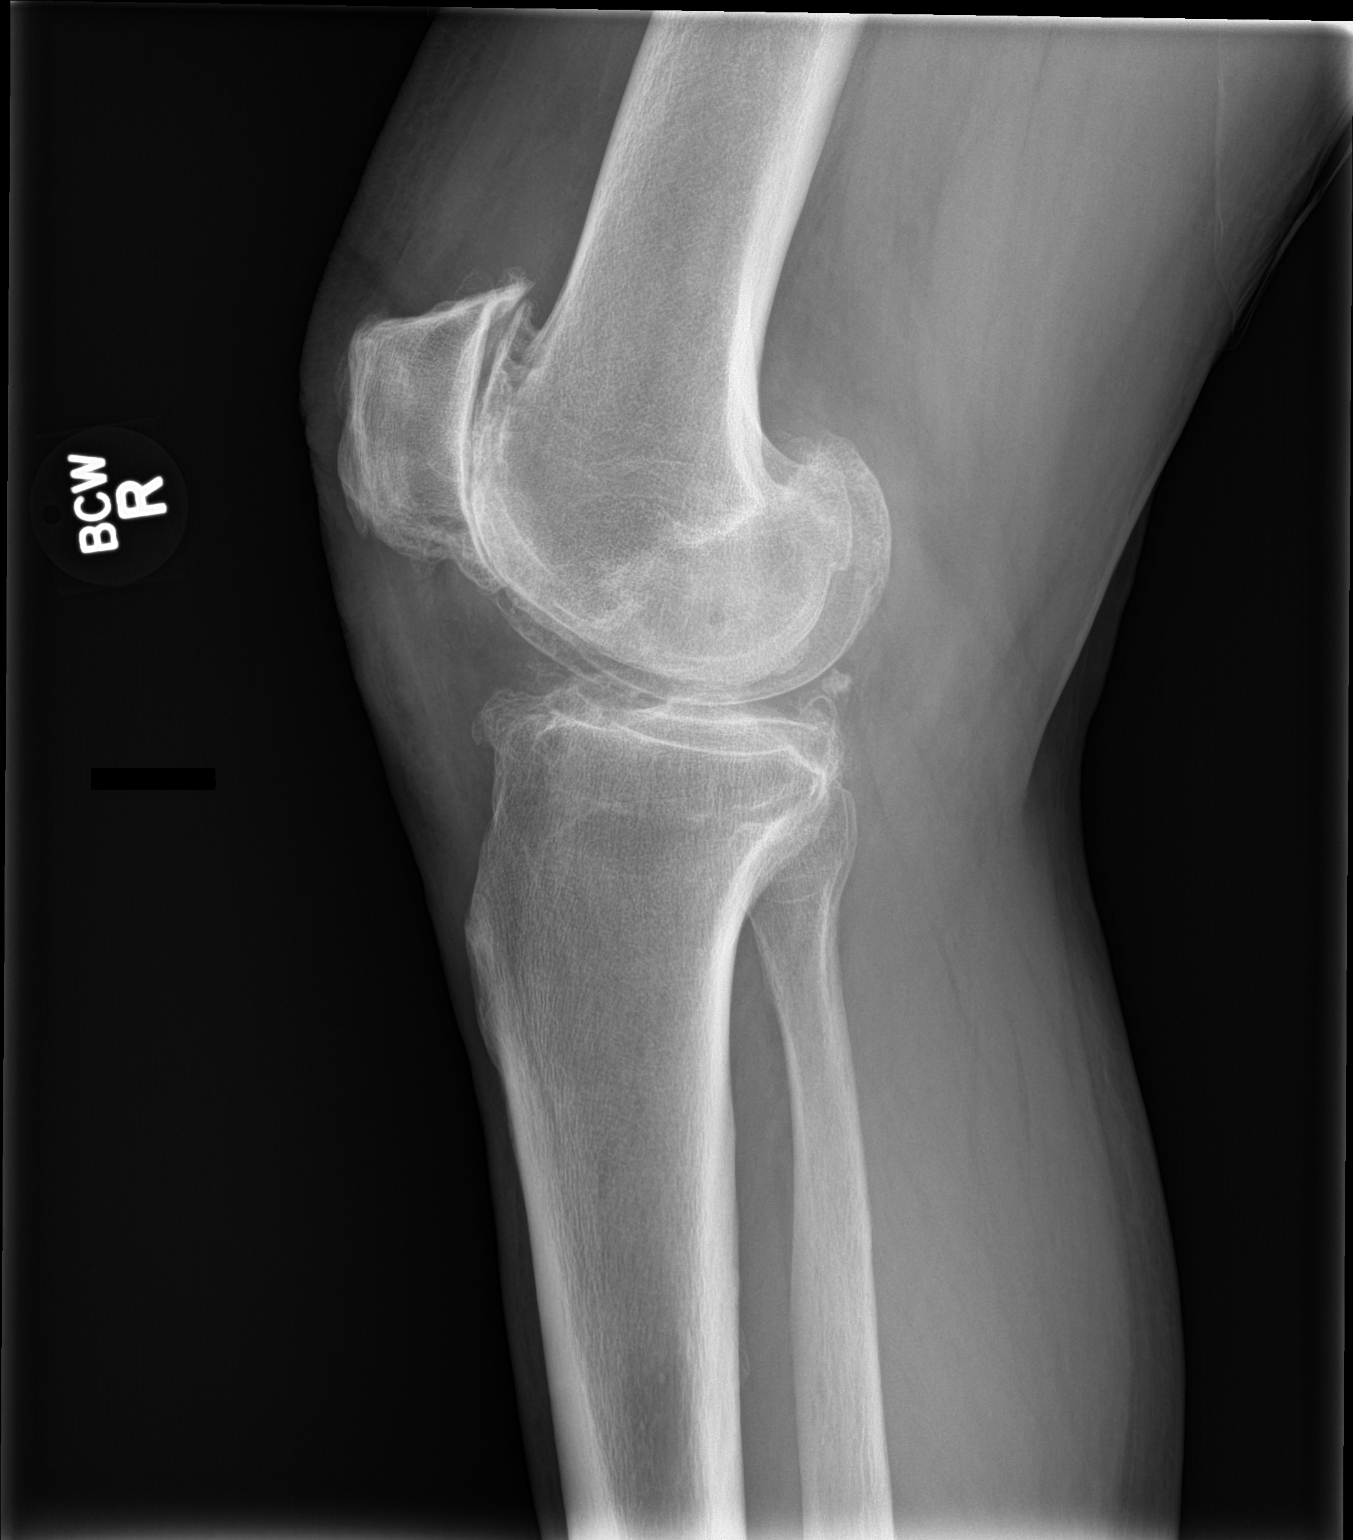

[2 of 2 positions shown; findings below may reference images not displayed]

FINDINGS: Two views of the right knee submitted. There is diffuse narrowing of
joint space most significant lateral compartment. Spurring of
lateral femoral condyle and lateral tibial plateau. Significant
narrowing of patellofemoral joint space. Spurring of patella.
Moderate joint no acute fracture or subluxation. There is narrowing
of medial joint compartment left knee joint. Spurring of medial
femoral condyle and medial tibial plateau on the left.
IMPRESSION: No acute fracture or subluxation. Extensive osteoarthritic changes
right knee joint especially lateral compartment and patellofemoral
joint space. Moderate joint effusion. Degenerative changes left knee
joint medial joint compartment.

## 2019-02-28 ENCOUNTER — Other Ambulatory Visit: Payer: Self-pay | Admitting: Family Medicine

## 2019-02-28 NOTE — Telephone Encounter (Signed)
Last Vit D 12/22/2018  21.5

## 2019-03-22 ENCOUNTER — Other Ambulatory Visit: Payer: Self-pay | Admitting: Family Medicine

## 2019-04-19 ENCOUNTER — Other Ambulatory Visit: Payer: Self-pay | Admitting: Family Medicine

## 2019-06-13 ENCOUNTER — Telehealth: Payer: Self-pay | Admitting: Family Medicine

## 2019-06-14 ENCOUNTER — Ambulatory Visit: Payer: Medicare HMO | Admitting: Family Medicine

## 2019-06-22 ENCOUNTER — Ambulatory Visit: Payer: Medicare HMO | Admitting: Family Medicine

## 2019-06-22 ENCOUNTER — Other Ambulatory Visit: Payer: Self-pay | Admitting: Family Medicine

## 2019-07-05 ENCOUNTER — Other Ambulatory Visit: Payer: Self-pay | Admitting: Family Medicine

## 2019-08-02 ENCOUNTER — Encounter: Payer: Self-pay | Admitting: Nurse Practitioner

## 2019-08-02 ENCOUNTER — Ambulatory Visit (INDEPENDENT_AMBULATORY_CARE_PROVIDER_SITE_OTHER): Payer: Medicare HMO | Admitting: Nurse Practitioner

## 2019-08-02 ENCOUNTER — Other Ambulatory Visit: Payer: Self-pay

## 2019-08-02 DIAGNOSIS — H938X1 Other specified disorders of right ear: Secondary | ICD-10-CM | POA: Diagnosis not present

## 2019-08-02 NOTE — Progress Notes (Signed)
   Virtual Visit via telephone Note Due to COVID-19 pandemic this visit was conducted virtually. This visit type was conducted due to national recommendations for restrictions regarding the COVID-19 Pandemic (e.g. social distancing, sheltering in place) in an effort to limit this patient's exposure and mitigate transmission in our community. All issues noted in this document were discussed and addressed.  A physical exam was not performed with this format.  I connected with Travis Wood on 08/02/19 at 10:00 by telephone and verified that I am speaking with the correct person using two identifiers. Travis Wood is currently located at home and no one is currently with him during visit. The provider, Mary-Margaret Hassell Done, FNP is located in their office at time of visit.  I discussed the limitations, risks, security and privacy concerns of performing an evaluation and management service by telephone and the availability of in person appointments. I also discussed with the patient that there may be a patient responsible charge related to this service. The patient expressed understanding and agreed to proceed.   History and Present Illness:   Chief Complaint: Otalgia   HPI Patient calls in for appointment today c/o left ear pain and swelling.  Started yesterday. He denies going swimming. Denies pain when wiggling ear. No drainage. Says he knows it it is swollen because he cannot get his finger in his ear canal. Feels like a hard knot in ear.    Review of Systems  Constitutional: Negative for diaphoresis and weight loss.  HENT: Positive for ear pain (mild). Negative for ear discharge and hearing loss.   Eyes: Negative for blurred vision, double vision and pain.  Respiratory: Negative for shortness of breath.   Cardiovascular: Negative for chest pain, palpitations, orthopnea and leg swelling.  Gastrointestinal: Negative for abdominal pain.  Skin: Negative for rash.  Neurological: Negative for  dizziness, sensory change, loss of consciousness, weakness and headaches.  Endo/Heme/Allergies: Negative for polydipsia. Does not bruise/bleed easily.  Psychiatric/Behavioral: Negative for memory loss. The patient does not have insomnia.   All other systems reviewed and are negative.    Observations/Objective: Alert and oriented- answers all questions appropriately No distress    Assessment and Plan: Travis Wood in today with chief complaint of Otalgia   1. Swelling of right ear appointment made for Thursday at 12:15 for face to face visit Warm compresses Do not try to stick anything into ear canal   Follow Up Instructions: As scheduled    I discussed the assessment and treatment plan with the patient. The patient was provided an opportunity to ask questions and all were answered. The patient agreed with the plan and demonstrated an understanding of the instructions.   The patient was advised to call back or seek an in-person evaluation if the symptoms worsen or if the condition fails to improve as anticipated.  The above assessment and management plan was discussed with the patient. The patient verbalized understanding of and has agreed to the management plan. Patient is aware to call the clinic if symptoms persist or worsen. Patient is aware when to return to the clinic for a follow-up visit. Patient educated on when it is appropriate to go to the emergency department.   Time call ended:  10:15  I provided 15 minutes of non-face-to-face time during this encounter.    Mary-Margaret Hassell Done, FNP

## 2019-08-04 ENCOUNTER — Encounter: Payer: Self-pay | Admitting: Nurse Practitioner

## 2019-08-04 ENCOUNTER — Ambulatory Visit (INDEPENDENT_AMBULATORY_CARE_PROVIDER_SITE_OTHER): Payer: Medicare HMO | Admitting: Nurse Practitioner

## 2019-08-04 ENCOUNTER — Other Ambulatory Visit: Payer: Self-pay

## 2019-08-04 VITALS — BP 138/83 | HR 63 | Temp 97.8°F | Ht 75.0 in | Wt 213.0 lb

## 2019-08-04 DIAGNOSIS — H938X2 Other specified disorders of left ear: Secondary | ICD-10-CM

## 2019-08-04 NOTE — Progress Notes (Signed)
   Subjective:    Patient ID: Travis Wood, male    DOB: 02-Feb-1951, 68 y.o.   MRN: [id number]   Chief Complaint: left ear swelling and ear swelling (Thinks he has an abcessed tooth)   HPI Patient comes in today c/o swelling of left cheek and ear several days ago. He did a telephone visit on Tuesday sept1,2020 and he was told he needed to come in for a face to face visit. Since his viist on Tuesday he is better,. Swelling and pain have subsided. He has a fractured back upper tooth on left but it is not painful. No pain with chewing food.  Review of Systems  Constitutional: Negative for activity change and appetite change.  HENT: Negative.  Negative for ear discharge, ear pain and facial swelling.   Eyes: Negative for pain.  Respiratory: Negative for shortness of breath.   Cardiovascular: Negative for chest pain, palpitations and leg swelling.  Gastrointestinal: Negative for abdominal pain.  Endocrine: Negative for polydipsia.  Genitourinary: Negative.   Skin: Negative for rash.  Neurological: Negative for dizziness, weakness and headaches.  Hematological: Does not bruise/bleed easily.  Psychiatric/Behavioral: Negative.   All other systems reviewed and are negative.      Objective:   Physical Exam Vitals signs and nursing note reviewed.  Constitutional:      Appearance: Normal appearance.  HENT:     Right Ear: Tympanic membrane, ear canal and external ear normal.     Left Ear: Tympanic membrane, ear canal and external ear normal.     Mouth/Throat:     Mouth: Mucous membranes are moist.     Comments: No dental caries noted Left upper 2nd lmolar fracture- gum pink and moist Cardiovascular:     Rate and Rhythm: Normal rate and regular rhythm.     Heart sounds: Normal heart sounds.  Pulmonary:     Effort: Pulmonary effort is normal.     Breath sounds: Normal breath sounds.  Musculoskeletal: Normal range of motion.  Skin:    General: Skin is warm and dry.  Neurological:    General: No focal deficit present.     Mental Status: He is alert and oriented to person, place, and time.  Psychiatric:        Mood and Affect: Mood normal.        Behavior: Behavior normal.    BP 138/83   Pulse 63   Temp 97.8 F (36.6 C) (Oral)   Ht 6\' 3"  (1.905 m)   Wt 213 lb (96.6 kg)   BMI 26.62 kg/m        Assessment & Plan:  [name] in today with chief complaint of left ear swelling and ear swelling (Thinks he has an abcessed tooth)   1. Swelling of left ear motrin OTC as needed rto prn  Mary-Margaret Hassell Done, FNP

## 2020-03-22 ENCOUNTER — Other Ambulatory Visit: Payer: Self-pay | Admitting: Family Medicine

## 2020-03-22 NOTE — Telephone Encounter (Signed)
Stacks. NTBS LOV 12/22/18 °

## 2020-05-14 ENCOUNTER — Other Ambulatory Visit: Payer: Self-pay | Admitting: Family Medicine

## 2020-05-15 MED ORDER — ATORVASTATIN CALCIUM 40 MG PO TABS: 40.0000 mg | ORAL_TABLET | Freq: Every day | ORAL | 0 refills | Status: DC

## 2020-05-15 NOTE — Telephone Encounter (Signed)
Stacks. NTBS LOV 12/22/18

## 2020-05-15 NOTE — Telephone Encounter (Signed)
Pt called/ appt made - 1 rf sent to last

## 2020-05-15 NOTE — Addendum Note (Signed)
Addended by: Zannie Cove on: 05/15/2020 09:12 AM   Modules accepted: Orders

## 2020-05-30 ENCOUNTER — Other Ambulatory Visit: Payer: Self-pay

## 2020-05-30 ENCOUNTER — Encounter: Payer: Self-pay | Admitting: Family Medicine

## 2020-05-30 ENCOUNTER — Ambulatory Visit (INDEPENDENT_AMBULATORY_CARE_PROVIDER_SITE_OTHER): Payer: Medicare HMO | Admitting: Family Medicine

## 2020-05-30 VITALS — BP 109/68 | HR 67 | Temp 97.6°F | Ht 75.0 in | Wt 209.2 lb

## 2020-05-30 DIAGNOSIS — M1711 Unilateral primary osteoarthritis, right knee: Secondary | ICD-10-CM | POA: Diagnosis not present

## 2020-05-30 DIAGNOSIS — E782 Mixed hyperlipidemia: Secondary | ICD-10-CM | POA: Diagnosis not present

## 2020-05-30 DIAGNOSIS — E559 Vitamin D deficiency, unspecified: Secondary | ICD-10-CM

## 2020-05-30 NOTE — Progress Notes (Signed)
Subjective:  Patient ID: Travis Wood, male    DOB: 08/15/1951  Age: 69 y.o. MRN: [id number]  CC: Follow-up (6 month)   HPI [name] presents for patient in for follow-up of elevated cholesterol. Doing well without complaints on current medication. Denies side effects of statin including myalgia and arthralgia and nausea. Also in today for liver function testing. Currently no chest pain, shortness of breath or other cardiovascular related symptoms noted.  He also took vitamin D for deficiency last year and is due for recheck of his vitamin D level to make sure he is staying on track with that.  Patient also has been trying to call and" is made this difficult enough that is right knee has been getting worse and worse.  He saw Dr. Alvan Dame of orthopedics sometime back he told him he had bone-on-bone arthritis and it was going to be time for knee replacement with the when [name] has pain that is bad enough to motivated.  Mr. [name] tells me today that he is there.  He wants a referral back to Dr. Alvan Dame and for something to help relieve his pain in the meantime.  He wants to wait till the fall for his surgery this would happen later when he is not trying to golf.  Depression screen [facility] 2/9 05/30/2020 08/04/2019 12/22/2018  Decreased Interest 0 0 0  Down, Depressed, Hopeless 0 0 0  PHQ - 2 Score 0 0 0    History Jeffory has a past medical history of Cataract, Erectile dysfunction, History of bronchitis, History of colon polyps, Hyperlipemia, Osteoarthritis, and Shortness of breath dyspnea.   He has a past surgical history that includes Appendectomy; Knee surgery; Mass excision (N/A, 09/12/2015); Cataract extraction w/PHACO (Left, 10/15/2015); and Cataract extraction w/PHACO (Right, 11/12/2015).   His family history includes Cancer in his sister; Colon cancer in his father; Colon polyps in his father; Heart disease in his father and mother.He reports that he quit smoking about 28 years ago. His smoking  use included cigarettes. He has a 5.00 pack-year smoking history. He has never used smokeless tobacco. He reports current alcohol use of about 1.0 standard drink of alcohol per week. He reports current drug use. Frequency: 1.00 time per week. Drug: Marijuana.    ROS Review of Systems  Constitutional: Negative for fever.  Respiratory: Negative for shortness of breath.   Cardiovascular: Negative for chest pain.  Musculoskeletal: Positive for arthralgias.  Skin: Negative for rash.    Objective:  BP 109/68   Pulse 67   Temp 97.6 F (36.4 C) (Temporal)   Ht '6\' 3"'  (1.905 m)   Wt 209 lb 3.2 oz (94.9 kg)   BMI 26.15 kg/m   BP Readings from Last 3 Encounters:  05/30/20 109/68  08/04/19 138/83  01/26/19 101/63    Wt Readings from Last 3 Encounters:  05/30/20 209 lb 3.2 oz (94.9 kg)  08/04/19 213 lb (96.6 kg)  01/26/19 229 lb (103.9 kg)     Physical Exam Vitals reviewed.  Constitutional:      Appearance: He is well-developed.  HENT:     Head: Normocephalic and atraumatic.     Right Ear: External ear normal.     Left Ear: External ear normal.     Mouth/Throat:     Pharynx: No oropharyngeal exudate or posterior oropharyngeal erythema.  Eyes:     Pupils: Pupils are equal, round, and reactive to light.  Cardiovascular:     Rate and Rhythm: Normal rate  and regular rhythm.     Heart sounds: No murmur heard.   Pulmonary:     Effort: No respiratory distress.     Breath sounds: Normal breath sounds.  Musculoskeletal:        General: Deformity (joint hypertrophy and cripitance, right knee) present.     Cervical back: Normal range of motion and neck supple.  Skin:    General: Skin is warm and dry.  Neurological:     Mental Status: He is alert and oriented to person, place, and time.       Assessment & Plan:   Jamaree was seen today for follow-up.  Diagnoses and all orders for this visit:  Mixed hyperlipidemia -     CMP14+EGFR -     CBC with Differential/Platelet -      Lipid panel  Vitamin D deficiency -     CMP14+EGFR -     CBC with Differential/Platelet -     VITAMIN D 25 Hydroxy (Vit-D Deficiency, Fractures)  Primary osteoarthritis of right knee -     CMP14+EGFR -     CBC with Differential/Platelet -     Ambulatory referral to Orthopedics     After obtaining informed consent, A steroid injection was performed at the right knee. The lateral compartment was cleansed with betadine. With 1.5 inch, 23 gauge needle using 2.5 ml marcan and 9 mg of Celestone the joint space was entered and injected. This was well tolerated. Simple dressing applied.    I have discontinued Paiton L. Colclough's Vitamin D (Ergocalciferol). I am also having him maintain his atorvastatin. We will continue to administer sodium chloride.  Allergies as of 05/30/2020   No Known Allergies     Medication List       Accurate as of May 30, 2020  9:13 AM. If you have any questions, ask your nurse or doctor.        STOP taking these medications   Vitamin D (Ergocalciferol) 1.25 MG (50000 UNIT) Caps capsule Commonly known as: DRISDOL Stopped by: Claretta Fraise, MD     TAKE these medications   atorvastatin 40 MG tablet Commonly known as: LIPITOR Take 1 tablet (40 mg total) by mouth daily at 6 PM. (Please make your 6 mos Aug appt)        Follow-up: Return in about 6 months (around 11/29/2020), or if symptoms worsen or fail to improve, for Compete physical, cholesterol.  Claretta Fraise, M.D.

## 2020-05-31 LAB — CMP14+EGFR
ALT: 13 IU/L (ref 0–44)
AST: 17 IU/L (ref 0–40)
Albumin/Globulin Ratio: 2.4 — ABNORMAL HIGH (ref 1.2–2.2)
Albumin: 4.4 g/dL (ref 3.8–4.8)
Alkaline Phosphatase: 93 IU/L (ref 48–121)
BUN/Creatinine Ratio: 18 (ref 10–24)
BUN: 17 mg/dL (ref 8–27)
Bilirubin Total: 0.5 mg/dL (ref 0.0–1.2)
CO2: 23 mmol/L (ref 20–29)
Calcium: 9.1 mg/dL (ref 8.6–10.2)
Chloride: 106 mmol/L (ref 96–106)
Creatinine, Ser: 0.94 mg/dL (ref 0.76–1.27)
GFR calc Af Amer: 96 mL/min/{1.73_m2} (ref >59)
GFR calc non Af Amer: 83 mL/min/{1.73_m2} (ref >59)
Globulin, Total: 1.8 g/dL (ref 1.5–4.5)
Glucose: 102 mg/dL — ABNORMAL HIGH (ref 65–99)
Potassium: 4.1 mmol/L (ref 3.5–5.2)
Sodium: 142 mmol/L (ref 134–144)
Total Protein: 6.2 g/dL (ref 6.0–8.5)

## 2020-05-31 LAB — CBC WITH DIFFERENTIAL/PLATELET
Basophils Absolute: 0.1 10*3/uL (ref 0.0–0.2)
Basos: 0 %
EOS (ABSOLUTE): 0.1 10*3/uL (ref 0.0–0.4)
Eos: 1 %
Hematocrit: 39.5 % (ref 37.5–51.0)
Hemoglobin: 13.8 g/dL (ref 13.0–17.7)
Immature Grans (Abs): 0 10*3/uL (ref 0.0–0.1)
Immature Granulocytes: 0 %
Lymphocytes Absolute: 19.7 10*3/uL — ABNORMAL HIGH (ref 0.7–3.1)
Lymphs: 71 %
MCH: 31.3 pg (ref 26.6–33.0)
MCHC: 34.9 g/dL (ref 31.5–35.7)
MCV: 90 fL (ref 79–97)
Monocytes Absolute: 2.9 10*3/uL — ABNORMAL HIGH (ref 0.1–0.9)
Monocytes: 10 %
Neutrophils Absolute: 5.1 10*3/uL (ref 1.4–7.0)
Neutrophils: 18 %
Platelets: 113 10*3/uL — ABNORMAL LOW (ref 150–450)
RBC: 4.41 x10E6/uL (ref 4.14–5.80)
RDW: 13.1 % (ref 11.6–15.4)
WBC: 27.9 10*3/uL (ref 3.4–10.8)

## 2020-05-31 LAB — LIPID PANEL
Chol/HDL Ratio: 4.5 ratio (ref 0.0–5.0)
Cholesterol, Total: 163 mg/dL (ref 100–199)
HDL: 36 mg/dL — ABNORMAL LOW (ref >39)
LDL Chol Calc (NIH): 106 mg/dL — ABNORMAL HIGH (ref 0–99)
Triglycerides: 118 mg/dL (ref 0–149)
VLDL Cholesterol Cal: 21 mg/dL (ref 5–40)

## 2020-05-31 LAB — VITAMIN D 25 HYDROXY (VIT D DEFICIENCY, FRACTURES): Vit D, 25-Hydroxy: 25.5 ng/mL — ABNORMAL LOW (ref 30.0–100.0)

## 2020-06-01 ENCOUNTER — Other Ambulatory Visit: Payer: Self-pay | Admitting: *Deleted

## 2020-06-01 ENCOUNTER — Other Ambulatory Visit: Payer: Self-pay | Admitting: Family Medicine

## 2020-06-01 DIAGNOSIS — D72829 Elevated white blood cell count, unspecified: Secondary | ICD-10-CM

## 2020-06-01 MED ORDER — VITAMIN D (ERGOCALCIFEROL) 1.25 MG (50000 UNIT) PO CAPS: 50000.0000 [IU] | ORAL_CAPSULE | ORAL | 3 refills | Status: DC

## 2020-06-01 NOTE — Progress Notes (Signed)
White blood count is elevated again like last year. Irecommend a repeat in 2 weeks. If it remains high, then follow up with hematology is recommended.

## 2020-06-06 ENCOUNTER — Other Ambulatory Visit: Payer: Medicare HMO

## 2020-06-06 ENCOUNTER — Other Ambulatory Visit: Payer: Self-pay

## 2020-06-06 DIAGNOSIS — D72829 Elevated white blood cell count, unspecified: Secondary | ICD-10-CM

## 2020-06-06 DIAGNOSIS — Z125 Encounter for screening for malignant neoplasm of prostate: Secondary | ICD-10-CM | POA: Diagnosis not present

## 2020-06-07 ENCOUNTER — Other Ambulatory Visit: Payer: Self-pay | Admitting: Family Medicine

## 2020-06-07 DIAGNOSIS — D72823 Leukemoid reaction: Secondary | ICD-10-CM

## 2020-06-07 LAB — CBC WITH DIFFERENTIAL/PLATELET
Basophils Absolute: 0.1 10*3/uL (ref 0.0–0.2)
Basos: 0 %
EOS (ABSOLUTE): 0.1 10*3/uL (ref 0.0–0.4)
Eos: 1 %
Hematocrit: 40.8 % (ref 37.5–51.0)
Hemoglobin: 14.2 g/dL (ref 13.0–17.7)
Immature Grans (Abs): 0 10*3/uL (ref 0.0–0.1)
Immature Granulocytes: 0 %
Lymphocytes Absolute: 21.3 10*3/uL — ABNORMAL HIGH (ref 0.7–3.1)
Lymphs: 75 %
MCH: 31.6 pg (ref 26.6–33.0)
MCHC: 34.8 g/dL (ref 31.5–35.7)
MCV: 91 fL (ref 79–97)
Monocytes Absolute: 1.8 10*3/uL — ABNORMAL HIGH (ref 0.1–0.9)
Monocytes: 6 %
Neutrophils Absolute: 5.3 10*3/uL (ref 1.4–7.0)
Neutrophils: 18 %
Platelets: 121 10*3/uL — ABNORMAL LOW (ref 150–450)
RBC: 4.49 x10E6/uL (ref 4.14–5.80)
RDW: 12.9 % (ref 11.6–15.4)
WBC: 28.7 10*3/uL (ref 3.4–10.8)

## 2020-06-07 LAB — PSA, TOTAL AND FREE
PSA, Free Pct: 23.3 %
PSA, Free: 0.14 ng/mL
Prostate Specific Ag, Serum: 0.6 ng/mL (ref 0.0–4.0)

## 2020-06-08 ENCOUNTER — Other Ambulatory Visit: Payer: Self-pay | Admitting: Family Medicine

## 2020-06-12 ENCOUNTER — Encounter (HOSPITAL_COMMUNITY): Payer: Self-pay

## 2020-06-12 ENCOUNTER — Other Ambulatory Visit: Payer: Self-pay

## 2020-06-13 ENCOUNTER — Encounter (HOSPITAL_COMMUNITY): Payer: Self-pay | Admitting: Hematology

## 2020-06-13 ENCOUNTER — Inpatient Hospital Stay (HOSPITAL_COMMUNITY): Payer: Medicare HMO | Attending: Hematology | Admitting: Hematology

## 2020-06-13 VITALS — BP 117/70 | HR 51 | Temp 97.3°F | Resp 18 | Ht 75.0 in | Wt 208.3 lb

## 2020-06-13 DIAGNOSIS — Z79899 Other long term (current) drug therapy: Secondary | ICD-10-CM | POA: Insufficient documentation

## 2020-06-13 DIAGNOSIS — E785 Hyperlipidemia, unspecified: Secondary | ICD-10-CM | POA: Insufficient documentation

## 2020-06-13 DIAGNOSIS — Z800 Family history of malignant neoplasm of digestive organs: Secondary | ICD-10-CM | POA: Diagnosis not present

## 2020-06-13 DIAGNOSIS — Z8249 Family history of ischemic heart disease and other diseases of the circulatory system: Secondary | ICD-10-CM | POA: Insufficient documentation

## 2020-06-13 DIAGNOSIS — C9110 Chronic lymphocytic leukemia of B-cell type not having achieved remission: Secondary | ICD-10-CM | POA: Diagnosis not present

## 2020-06-13 DIAGNOSIS — F17210 Nicotine dependence, cigarettes, uncomplicated: Secondary | ICD-10-CM | POA: Insufficient documentation

## 2020-06-13 DIAGNOSIS — R69 Illness, unspecified: Secondary | ICD-10-CM | POA: Diagnosis not present

## 2020-06-13 DIAGNOSIS — Z8349 Family history of other endocrine, nutritional and metabolic diseases: Secondary | ICD-10-CM | POA: Diagnosis not present

## 2020-06-13 DIAGNOSIS — Z96652 Presence of left artificial knee joint: Secondary | ICD-10-CM | POA: Diagnosis not present

## 2020-06-13 NOTE — Patient Instructions (Signed)
[place] at [facility] Discharge Instructions  You were seen today by Dr. Delton Coombes. He went over your recent results and possible diagnoses, including chronic lymphocytic leukemia (CLL). Stay vigilant for any possible symptoms, including fevers, chills, night sweats, or unexplained weight loss. Dr. Delton Coombes will see you back in 4 months for labs and follow up.   Thank you for choosing [place] at [facility] to provide your oncology and hematology care.  To afford each patient quality time with our provider, please arrive at least 15 minutes before your scheduled appointment time.   If you have a lab appointment with the [facility] please come in thru the Main Entrance and check in at the main information desk  You need to re-schedule your appointment should you arrive 10 or more minutes late.  We strive to give you quality time with our providers, and arriving late affects you and other patients whose appointments are after yours.  Also, if you no show three or more times for appointments you may be dismissed from the clinic at the providers discretion.     Again, thank you for choosing [facility]  Our hope is that these requests will decrease the amount of time that you wait before being seen by our physicians.       _____________________________________________________________  Should you have questions after your visit to [facility], please contact our office at [phone] between the hours of 8:00 a.m. and 4:30 p.m.  Voicemails left after 4:00 p.m. will not be returned until the following business day.  For prescription refill requests, have your pharmacy contact our office and allow 72 hours.    Cancer Center Support Programs:   > Cancer Support Group  2nd Tuesday of the month 1pm-2pm, Journey Room

## 2020-06-13 NOTE — Progress Notes (Signed)
Travis Wood, [place] [id number]   CLINIC:  Medical Oncology/Hematology  Patient Care Team: Claretta Fraise, MD as PCP - General (Family Medicine)  CHIEF COMPLAINTS/PURPOSE OF CONSULTATION:  Evaluation of leukocytosis.  HISTORY OF PRESENTING ILLNESS:  Travis Wood 69 y.o. male is here because of evaluation of leukemoid reaction, at the request of Dr. Claretta Fraise from [facility]. He has been having a steadily elevated WBC count since 12/22/2018, whereas his WBC count has been WNL as far back as 2016. JAK2 and BCR/ABL were negative in 01/26/2019, but his flow cytometry showed CLL.  Today he reports feeling great and denies N/V/D, F/C/night sweats, or recent infections, though he has lost 40 lbs in the past 1 year through dietary changes, which he reports was easy to lose the weight. He will eat a light breakfast accompanied by a protein shake, skip lunch, and then a dinner at the end of the day. He denies changes in appetite. He plays golf several times per week.  His father had colon cancer and polyps; mother had probable leukemia. He used to smoke 1 PPD for 10 years and quit 25 years ago. He lives at home with his girlfriend and does all of his chores around the home. He used to work for [employer], doing Water engineer work on Hydrographic surveyor. He will have another left knee replacement this year.   MEDICAL HISTORY:  Past Medical History:  Diagnosis Date  . Cataract   . Erectile dysfunction   . History of bronchitis   . History of colon polyps   . Hyperlipemia   . Osteoarthritis   . Shortness of breath dyspnea     SURGICAL HISTORY: Past Surgical History:  Procedure Laterality Date  . APPENDECTOMY    . CATARACT EXTRACTION W/PHACO Left 10/15/2015   Procedure: CATARACT EXTRACTION PHACO AND INTRAOCULAR LENS PLACEMENT LEFT EYE;  Surgeon: Tonny Branch, MD;  Location: [facility];  Service: Ophthalmology;  Laterality: Left;  CDE:12.20  . CATARACT  EXTRACTION W/PHACO Right 11/12/2015   Procedure: CATARACT EXTRACTION PHACO AND INTRAOCULAR LENS PLACEMENT RIGHT EYE:  CDE:  8.99;  Surgeon: Tonny Branch, MD;  Location: [facility];  Service: Ophthalmology;  Laterality: Right;  . KNEE SURGERY     Left x 2   . MASS EXCISION N/A 09/12/2015   Procedure: EXCISION SOFT TISSUE NEOPLASM (6 CM), BACK;  Surgeon: Aviva Signs, MD;  Location: [facility];  Service: General;  Laterality: N/A;    SOCIAL HISTORY: Social History   Socioeconomic History  . Marital status: Single    Spouse name: Not on file  . Number of children: 2  . Years of education: Not on file  . Highest education level: Not on file  Occupational History  . Occupation: RETIRED  Tobacco Use  . Smoking status: Current Every Day Smoker    Packs/day: 0.00    Years: 0.00    Pack years: 0.00    Types: Cigarettes, Pipe    Last attempt to quit: 09/10/1991    Years since quitting: 28.7  . Smokeless tobacco: Never Used  Substance and Sexual Activity  . Alcohol use: Yes    Alcohol/week: 1.0 standard drink    Types: 1 Cans of beer per week    Comment: occasional  . Drug use: Yes    Frequency: 1.0 times per week    Types: Marijuana  . Sexual activity: Yes  Other Topics Concern  . Not on file  Social History Narrative  0 caffeine drinks daily    Social Determinants of Health   Financial Resource Strain:   . Difficulty of Paying Living Expenses:   Food Insecurity:   . Worried About Charity fundraiser in the Last Year:   . Arboriculturist in the Last Year:   Transportation Needs:   . Film/video editor (Medical):   Marland Kitchen Lack of Transportation (Non-Medical):   Physical Activity:   . Days of Exercise per Week:   . Minutes of Exercise per Session:   Stress:   . Feeling of Stress :   Social Connections:   . Frequency of Communication with Friends and Family:   . Frequency of Social Gatherings with Friends and Family:   . Attends Religious Services:   . Active Member of Clubs or  Organizations:   . Attends Archivist Meetings:   Marland Kitchen Marital Status:   Intimate Partner Violence:   . Fear of Current or Ex-Partner:   . Emotionally Abused:   Marland Kitchen Physically Abused:   . Sexually Abused:     FAMILY HISTORY: Family History  Problem Relation Age of Onset  . Colon cancer Father        age 68   . Colon polyps Father   . Heart disease Father        Heart failure  . Heart disease Mother   . Cancer Sister        Uterine, and ovarian  . Hypertension Brother   . Obesity Sister   . Heart disease Maternal Grandmother   . Esophageal cancer Neg Hx   . Liver cancer Neg Hx   . Pancreatic cancer Neg Hx   . Rectal cancer Neg Hx   . Stomach cancer Neg Hx     ALLERGIES:  has No Known Allergies.  MEDICATIONS:  Current Outpatient Medications  Medication Sig Dispense Refill  . atorvastatin (LIPITOR) 40 MG tablet Take 1 tablet (40 mg total) by mouth daily at 6 PM. 90 tablet 1  . Vitamin D, Ergocalciferol, (DRISDOL) 1.25 MG (50000 UNIT) CAPS capsule Take 1 capsule (50,000 Units total) by mouth every 7 (seven) days. 13 capsule 3   Current Facility-Administered Medications  Medication Dose Route Frequency Provider Last Rate Last Admin  . 0.9 %  sodium chloride infusion  500 mL Intravenous Once Irene Shipper, MD        REVIEW OF SYSTEMS:   Review of Systems  Constitutional: Negative for appetite change, chills, diaphoresis, fatigue, fever and unexpected weight change.  Gastrointestinal: Negative for diarrhea, nausea and vomiting.  Neurological: Negative for numbness.  Hematological: Negative for adenopathy.  All other systems reviewed and are negative.    PHYSICAL EXAMINATION: ECOG PERFORMANCE STATUS: 0 - Asymptomatic  Vitals:   06/13/20 0825  BP: 117/70  Pulse: (!) 51  Resp: 18  Temp: (!) 97.3 F (36.3 C)  SpO2: 97%   Filed Weights   06/13/20 0825  Weight: 208 lb 4.8 oz (94.5 kg)   Physical Exam Vitals reviewed.  Constitutional:      Appearance:  Normal appearance.  Cardiovascular:     Rate and Rhythm: Normal rate and regular rhythm.     Pulses: Normal pulses.     Heart sounds: Normal heart sounds.  Pulmonary:     Effort: Pulmonary effort is normal.     Breath sounds: Normal breath sounds.  Abdominal:     Palpations: Abdomen is soft. There is no hepatomegaly, splenomegaly or mass.  Tenderness: There is no abdominal tenderness.     Hernia: No hernia is present.  Musculoskeletal:     Right lower leg: No edema.     Left lower leg: No edema.  Lymphadenopathy:     Cervical: No cervical adenopathy.     Upper Body:     Right upper body: No supraclavicular or axillary adenopathy.     Left upper body: No supraclavicular or axillary adenopathy.     Lower Body: No right inguinal adenopathy. No left inguinal adenopathy.  Neurological:     General: No focal deficit present.     Mental Status: He is alert and oriented to person, place, and time.  Psychiatric:        Mood and Affect: Mood normal.        Behavior: Behavior normal.      LABORATORY DATA:  I have reviewed the data as listed Recent Results (from the past 2160 hour(s))  CMP14+EGFR     Status: Abnormal   Collection Time: 05/30/20  9:32 AM  Result Value Ref Range   Glucose 102 (H) 65 - 99 mg/dL   BUN 17 8 - 27 mg/dL   Creatinine, Ser 0.94 0.76 - 1.27 mg/dL   GFR calc non Af Amer 83 >59 mL/min/1.73   GFR calc Af Amer 96 >59 mL/min/1.73    Comment: **[facility] currently reports eGFR in compliance with the current**   recommendations of the [employer]. [facility] will   update reporting as new guidelines are published from the NKF-ASN   Task force.    BUN/Creatinine Ratio 18 10 - 24   Sodium 142 134 - 144 mmol/L   Potassium 4.1 3.5 - 5.2 mmol/L   Chloride 106 96 - 106 mmol/L   CO2 23 20 - 29 mmol/L   Calcium 9.1 8.6 - 10.2 mg/dL   Total Protein 6.2 6.0 - 8.5 g/dL   Albumin 4.4 3.8 - 4.8 g/dL   Globulin, Total 1.8 1.5 - 4.5 g/dL   Albumin/Globulin  Ratio 2.4 (H) 1.2 - 2.2   Bilirubin Total 0.5 0.0 - 1.2 mg/dL   Alkaline Phosphatase 93 48 - 121 IU/L   AST 17 0 - 40 IU/L   ALT 13 0 - 44 IU/L  CBC with Differential/Platelet     Status: Abnormal   Collection Time: 05/30/20  9:32 AM  Result Value Ref Range   WBC 27.9 (HH) 3.4 - 10.8 x10E3/uL   RBC 4.41 4.14 - 5.80 x10E6/uL   Hemoglobin 13.8 13.0 - 17.7 g/dL   Hematocrit 39.5 37.5 - 51.0 %   MCV 90 79 - 97 fL   MCH 31.3 26.6 - 33.0 pg   MCHC 34.9 31 - 35 g/dL   RDW 13.1 11.6 - 15.4 %   Platelets 113 (L) 150 - 450 x10E3/uL    Comment: Actual platelet count may be somewhat higher than reported due to aggregation of platelets in this sample.    Neutrophils 18 Not Estab. %   Lymphs 71 Not Estab. %   Monocytes 10 Not Estab. %   Eos 1 Not Estab. %   Basos 0 Not Estab. %   Neutrophils Absolute 5.1 1 - 7 x10E3/uL   Lymphocytes Absolute 19.7 (H) 0 - 3 x10E3/uL   Monocytes Absolute 2.9 (H) 0 - 0 x10E3/uL   EOS (ABSOLUTE) 0.1 0.0 - 0.4 x10E3/uL   Basophils Absolute 0.1 0 - 0 x10E3/uL   Immature Granulocytes 0 Not Estab. %   Immature Grans (Abs)  0.0 0.0 - 0.1 x10E3/uL   Hematology Comments: Note:     Comment: Verified by microscopic examination.  Lipid panel     Status: Abnormal   Collection Time: 05/30/20  9:32 AM  Result Value Ref Range   Cholesterol, Total 163 100 - 199 mg/dL   Triglycerides 118 0 - 149 mg/dL   HDL 36 (L) >39 mg/dL   VLDL Cholesterol Cal 21 5 - 40 mg/dL   LDL Chol Calc (NIH) 106 (H) 0 - 99 mg/dL   Chol/HDL Ratio 4.5 0.0 - 5.0 ratio    Comment:                                   T. Chol/HDL Ratio                                             Men  Women                               1/2 Avg.Risk  3.4    3.3                                   Avg.Risk  5.0    4.4                                2X Avg.Risk  9.6    7.1                                3X Avg.Risk 23.4   11.0   VITAMIN D 25 Hydroxy (Vit-D Deficiency, Fractures)     Status: Abnormal   Collection Time:  05/30/20  9:32 AM  Result Value Ref Range   Vit D, 25-Hydroxy 25.5 (L) 30.0 - 100.0 ng/mL    Comment: Vitamin D deficiency has been defined by the [place] practice guideline as a level of serum 25-OH vitamin D less than 20 ng/mL (1,2). The Endocrine Society went on to further define vitamin D insufficiency as a level between 21 and 29 ng/mL (2). 1. IOM (Institute of Medicine). 2010. Dietary reference    intakes for calcium and D. [place]: The    [employer]. 2. Holick MF, Binkley D[place], Bischoff-Ferrari HA, et al.    Evaluation, treatment, and prevention of vitamin D    deficiency: an Endocrine Society clinical practice    guideline. JCEM. 2011 Jul; 96(7):1911-30.   CBC with Differential/Platelet     Status: Abnormal   Collection Time: 06/06/20 10:14 AM  Result Value Ref Range   WBC 28.7 (HH) 3.4 - 10.8 x10E3/uL   RBC 4.49 4.14 - 5.80 x10E6/uL   Hemoglobin 14.2 13.0 - 17.7 g/dL   Hematocrit 40.8 37.5 - 51.0 %   MCV 91 79 - 97 fL   MCH 31.6 26.6 - 33.0 pg   MCHC 34.8 31 - 35 g/dL   RDW 12.9 11.6 - 15.4 %   Platelets 121 (L) 150 - 450 x10E3/uL   Neutrophils 18 Not Estab. %  Lymphs 75 Not Estab. %   Monocytes 6 Not Estab. %   Eos 1 Not Estab. %   Basos 0 Not Estab. %   Neutrophils Absolute 5.3 1 - 7 x10E3/uL   Lymphocytes Absolute 21.3 (H) 0 - 3 x10E3/uL   Monocytes Absolute 1.8 (H) 0 - 0 x10E3/uL   EOS (ABSOLUTE) 0.1 0.0 - 0.4 x10E3/uL   Basophils Absolute 0.1 0 - 0 x10E3/uL   Immature Granulocytes 0 Not Estab. %   Immature Grans (Abs) 0.0 0.0 - 0.1 x10E3/uL   Hematology Comments: Note:     Comment: Verified by microscopic examination.  PSA, total and free     Status: None   Collection Time: 06/06/20 10:37 AM  Result Value Ref Range   Prostate Specific Ag, Serum 0.6 0.0 - 4.0 ng/mL    Comment: Roche ECLIA methodology. According to the American Urological Association, Serum PSA should decrease and remain at  undetectable levels after radical prostatectomy. The AUA defines biochemical recurrence as an initial PSA value 0.2 ng/mL or greater followed by a subsequent confirmatory PSA value 0.2 ng/mL or greater. Values obtained with different assay methods or kits cannot be used interchangeably. Results cannot be interpreted as absolute evidence of the presence or absence of malignant disease.    PSA, Free 0.14 N/A ng/mL    Comment: Roche ECLIA methodology.   PSA, Free Pct 23.3 %    Comment: The table below lists the probability of prostate cancer for men with non-suspicious DRE results and total PSA between 4 and 10 ng/mL, by patient age Ricci Barker, [place], [id number]).                   % Free PSA       50-64 yr        65-75 yr                   0.00-10.00%        56%             55%                  10.01-15.00%        24%             35%                  15.01-20.00%        17%             23%                  20.01-25.00%        10%             20%                       >25.00%         5%              9% Please note:  Catalona et al did not make specific               recommendations regarding the use of               percent free PSA for any other population               of men.     RADIOGRAPHIC STUDIES: I have personally reviewed the radiological images as listed and agreed with  the findings in the report.  ASSESSMENT:  1.  CLL: -Flow cytometry on 01/26/2019 showed monoclonal B-cell population consistent with CLL. -He does not have any fevers or night sweats.  He lost about 40 pounds in the last 1 year but has drastically modified his diet.  No recurrent infections. -Physical exam did not reveal any palpable splenomegaly or lymphadenopathy.  He is fairly active and plays golf about 3 times a week.  He quit smoking 30 years ago, smoked 1 pack/day for 10 years. -CBC from 06/06/2020 shows white count 28.7, hemoglobin 14.2, platelet count 121.  2.  Family history: -Mother had  some kind of "leukemia".  Father had colon cancer.   PLAN:  1.  Stage 0 CLL: -We talked about the normal pathophysiology and prognosis of chronic lymphocytic leukemia. -We talked about the treatment indications including anemia with hemoglobin less than 11, progressive thrombocytopenia, bulky adenopathy or splenomegaly, severe fatigue, night sweats, weight loss, fever without infection among others. -I plan to reevaluate him in 4 months with repeat labs.  If he is stable, we will switch him to every 85-monthvisits.   All questions were answered. The patient knows to call the clinic with any problems, questions or concerns.   SDerek Jack MD 06/13/20 5:22 PM  [phone]  I, DMilinda Antis am acting as a scribe for Dr. SSanda Linger  I, SDerek JackMD, have reviewed the above documentation for accuracy and completeness, and I agree with the above.

## 2020-06-20 ENCOUNTER — Telehealth: Payer: Self-pay | Admitting: Family Medicine

## 2020-06-20 MED ORDER — ATORVASTATIN CALCIUM 40 MG PO TABS: 40.0000 mg | ORAL_TABLET | Freq: Every day | ORAL | 1 refills | Status: DC

## 2020-06-20 NOTE — Telephone Encounter (Signed)
  Prescription Request  06/20/2020  What is the name of the medication or equipment? atorvastatin (LIPITOR) 40 MG tablet   Have you contacted your pharmacy to request a refill? (if applicable) yes  Which pharmacy would you like this sent to? [facility]    Patient notified that their request is being sent to the clinical staff for review and that they should receive a response within 2 business days.

## 2020-06-20 NOTE — Telephone Encounter (Signed)
Refill sent over patient aware

## 2020-09-07 DIAGNOSIS — M1711 Unilateral primary osteoarthritis, right knee: Secondary | ICD-10-CM | POA: Diagnosis not present

## 2020-09-24 DIAGNOSIS — M1711 Unilateral primary osteoarthritis, right knee: Secondary | ICD-10-CM | POA: Diagnosis not present

## 2020-09-24 DIAGNOSIS — M25561 Pain in right knee: Secondary | ICD-10-CM | POA: Diagnosis not present

## 2020-09-24 NOTE — H&P (Signed)
TOTAL KNEE ADMISSION H&P  Patient is being admitted for right total knee arthroplasty.  Subjective:  Chief Complaint:  Right knee OA / pain  HPI: Travis Wood, 69 y.o. male, has a history of pain and functional disability in the right knee due to arthritis and has failed non-surgical conservative treatments for greater than 12 weeks to include NSAID's and/or analgesics, corticosteriod injections, use of assistive devices and activity modification.  Onset of symptoms was gradual, starting  years ago with gradually worsening course since that time. The patient noted prior procedures on the knee to include  arthroscopy on the right knee(s).  Patient currently rates pain in the right knee(s) at 7 out of 10 with activity. Patient has night pain, worsening of pain with activity and weight bearing, pain that interferes with activities of daily living, pain with passive range of motion, crepitus and joint swelling.  Patient has evidence of periarticular osteophytes and joint space narrowing by imaging studies. There is no active infection.  Risks, benefits and expectations were discussed with the patient.  Risks including but not limited to the risk of anesthesia, blood clots, nerve damage, blood vessel damage, failure of the prosthesis, infection and up to and including death.  Patient understand the risks, benefits and expectations and wishes to proceed with surgery.   PCP: Claretta Fraise, MD  D/C Plans:       Home   Post-op Meds:       No Rx given   Tranexamic Acid:      To be given - IV   Decadron:      Is to be given  FYI:      ASA  Norco  DME:   Pt already has a RW and arrangements for 3-n-1  PT:   [place]: [place]    Patient Active Problem List   Diagnosis Date Noted  . CLL (chronic lymphocytic leukemia) ([place]) 06/13/2020  . Eustachian tube dysfunction 12/14/2015  . Hyperlipidemia 06/13/2015   Past Medical History:  Diagnosis Date  . Cataract    . Erectile dysfunction   . History of bronchitis   . History of colon polyps   . Hyperlipemia   . Osteoarthritis   . Shortness of breath dyspnea     Past Surgical History:  Procedure Laterality Date  . APPENDECTOMY    . CATARACT EXTRACTION W/PHACO Left 10/15/2015   Procedure: CATARACT EXTRACTION PHACO AND INTRAOCULAR LENS PLACEMENT LEFT EYE;  Surgeon: Tonny Branch, MD;  Location: [facility];  Service: Ophthalmology;  Laterality: Left;  CDE:12.20  . CATARACT EXTRACTION W/PHACO Right 11/12/2015   Procedure: CATARACT EXTRACTION PHACO AND INTRAOCULAR LENS PLACEMENT RIGHT EYE:  CDE:  8.99;  Surgeon: Tonny Branch, MD;  Location: [facility];  Service: Ophthalmology;  Laterality: Right;  . KNEE SURGERY     Left x 2   . MASS EXCISION N/A 09/12/2015   Procedure: EXCISION SOFT TISSUE NEOPLASM (6 CM), BACK;  Surgeon: Aviva Signs, MD;  Location: [facility];  Service: General;  Laterality: N/A;    Current Facility-Administered Medications  Medication Dose Route Frequency Provider Last Rate Last Admin  . 0.9 %  sodium chloride infusion  500 mL Intravenous Once Irene Shipper, MD       Current Outpatient Medications  Medication Sig Dispense Refill Last Dose  . atorvastatin (LIPITOR) 40 MG tablet Take 1 tablet (40 mg total) by mouth daily at 6 PM. 90 tablet 1   . Vitamin D,  Ergocalciferol, (DRISDOL) 1.25 MG (50000 UNIT) CAPS capsule Take 1 capsule (50,000 Units total) by mouth every 7 (seven) days. 13 capsule 3    No Known Allergies   Social History   Tobacco Use  . Smoking status: Current Every Day Smoker    Packs/day: 0.00    Years: 0.00    Pack years: 0.00    Types: Cigarettes, Pipe    Last attempt to quit: 09/10/1991    Years since quitting: 29.0  . Smokeless tobacco: Never Used  Substance Use Topics  . Alcohol use: Yes    Alcohol/week: 1.0 standard drink    Types: 1 Cans of beer per week    Comment: occasional    Family History  Problem Relation Age of Onset  . Colon cancer Father        age  16   . Colon polyps Father   . Heart disease Father        Heart failure  . Heart disease Mother   . Cancer Sister        Uterine, and ovarian  . Hypertension Brother   . Obesity Sister   . Heart disease Maternal Grandmother   . Esophageal cancer Neg Hx   . Liver cancer Neg Hx   . Pancreatic cancer Neg Hx   . Rectal cancer Neg Hx   . Stomach cancer Neg Hx      Review of Systems  Constitutional: Negative.   HENT: Negative.   Eyes: Negative.   Respiratory: Negative.   Cardiovascular: Negative.   Gastrointestinal: Negative.   Genitourinary: Negative.   Musculoskeletal: Positive for joint pain.  Skin: Negative.   Neurological: Negative.   Endo/Heme/Allergies: Negative.   Psychiatric/Behavioral: Negative.      Objective:  Physical Exam Constitutional:      Appearance: He is well-developed.  HENT:     Head: Normocephalic.  Eyes:     Pupils: Pupils are equal, round, and reactive to light.  Neck:     Thyroid: No thyromegaly.     Vascular: No JVD.     Trachea: No tracheal deviation.  Cardiovascular:     Rate and Rhythm: Normal rate and regular rhythm.  Pulmonary:     Effort: Pulmonary effort is normal. No respiratory distress.     Breath sounds: Normal breath sounds. No wheezing.  Abdominal:     Palpations: Abdomen is soft.     Tenderness: There is no abdominal tenderness. There is no guarding.  Musculoskeletal:     Cervical back: Neck supple.     Right knee: Swelling and bony tenderness present. No erythema or ecchymosis. Decreased range of motion. Tenderness present.  Lymphadenopathy:     Cervical: No cervical adenopathy.  Skin:    General: Skin is warm and dry.  Neurological:     Mental Status: He is alert and oriented to person, place, and time.       Labs:  Estimated body mass index is 26.04 kg/m as calculated from the following:   Height as of 06/13/20: 6\' 3"  (1.905 m).   Weight as of 06/13/20: 94.5 kg.   Imaging Review Plain radiographs  demonstrate severe degenerative joint disease of the right knee.  The bone quality appears to be good for age and reported activity level.      Assessment/Plan:  End stage arthritis, right knee   The patient history, physical examination, clinical judgment of the provider and imaging studies are consistent with end stage degenerative joint disease of the right knee(s)  and total knee arthroplasty is deemed medically necessary. The treatment options including medical management, injection therapy arthroscopy and arthroplasty were discussed at length. The risks and benefits of total knee arthroplasty were presented and reviewed. The risks due to aseptic loosening, infection, stiffness, patella tracking problems, thromboembolic complications and other imponderables were discussed. The patient acknowledged the explanation, agreed to proceed with the plan and consent was signed. Patient is being admitted for treatment for surgery, pain control, PT, OT, prophylactic antibiotics, VTE prophylaxis, progressive ambulation and ADL's and discharge planning. The patient is planning to be discharged home     Patient's anticipated LOS is less than 2 midnights, meeting these requirements: - Lives within 1 hour of care - Has a competent adult at home to recover with post-op recover - NO history of  - Chronic pain requiring opiods  - Diabetes  - Coronary Artery Disease  - Heart failure  - Heart attack  - Stroke  - DVT/VTE  - Cardiac arrhythmia  - Respiratory Failure/COPD  - Renal failure  - Anemia  - Advanced Liver disease    [place]. Phillp Dolores   PA-C  09/24/2020, 12:22 PM

## 2020-10-01 ENCOUNTER — Other Ambulatory Visit: Payer: Self-pay

## 2020-10-01 ENCOUNTER — Inpatient Hospital Stay (HOSPITAL_COMMUNITY): Payer: Medicare HMO | Attending: Hematology

## 2020-10-01 DIAGNOSIS — C9110 Chronic lymphocytic leukemia of B-cell type not having achieved remission: Secondary | ICD-10-CM | POA: Diagnosis not present

## 2020-10-01 DIAGNOSIS — Z87891 Personal history of nicotine dependence: Secondary | ICD-10-CM | POA: Diagnosis not present

## 2020-10-01 DIAGNOSIS — Z79899 Other long term (current) drug therapy: Secondary | ICD-10-CM | POA: Diagnosis not present

## 2020-10-01 DIAGNOSIS — Z8719 Personal history of other diseases of the digestive system: Secondary | ICD-10-CM | POA: Insufficient documentation

## 2020-10-01 LAB — CBC WITH DIFFERENTIAL/PLATELET
Basophils Absolute: 0 10*3/uL (ref 0.0–0.1)
Basophils Relative: 0 %
Eosinophils Absolute: 0 10*3/uL (ref 0.0–0.5)
Eosinophils Relative: 0 %
HCT: 41 % (ref 39.0–52.0)
Hemoglobin: 13.6 g/dL (ref 13.0–17.0)
Lymphocytes Relative: 71 %
Lymphs Abs: 22 10*3/uL — ABNORMAL HIGH (ref 0.7–4.0)
MCH: 31.5 pg (ref 26.0–34.0)
MCHC: 33.2 g/dL (ref 30.0–36.0)
MCV: 94.9 fL (ref 80.0–100.0)
Monocytes Absolute: 4.1 10*3/uL — ABNORMAL HIGH (ref 0.1–1.0)
Monocytes Relative: 13 %
Neutro Abs: 4.9 10*3/uL (ref 1.7–7.7)
Neutrophils Relative %: 16 %
Platelets: 118 10*3/uL — ABNORMAL LOW (ref 150–400)
RBC: 4.32 MIL/uL (ref 4.22–5.81)
RDW: 12.8 % (ref 11.5–15.5)
WBC: 31.3 10*3/uL — ABNORMAL HIGH (ref 4.0–10.5)
nRBC: 0 % (ref 0.0–0.2)

## 2020-10-01 LAB — COMPREHENSIVE METABOLIC PANEL
ALT: 23 U/L (ref 0–44)
AST: 24 U/L (ref 15–41)
Albumin: 4.2 g/dL (ref 3.5–5.0)
Alkaline Phosphatase: 76 U/L (ref 38–126)
Anion gap: 9 (ref 5–15)
BUN: 18 mg/dL (ref 8–23)
CO2: 27 mmol/L (ref 22–32)
Calcium: 9 mg/dL (ref 8.9–10.3)
Chloride: 103 mmol/L (ref 98–111)
Creatinine, Ser: 0.82 mg/dL (ref 0.61–1.24)
GFR, Estimated: >60 mL/min (ref >60)
Glucose, Bld: 95 mg/dL (ref 70–99)
Potassium: 4.4 mmol/L (ref 3.5–5.1)
Sodium: 139 mmol/L (ref 135–145)
Total Bilirubin: 0.8 mg/dL (ref 0.3–1.2)
Total Protein: 6.5 g/dL (ref 6.5–8.1)

## 2020-10-01 LAB — LACTATE DEHYDROGENASE: LDH: 143 U/L (ref 98–192)

## 2020-10-01 LAB — RETICULOCYTES
Immature Retic Fract: 10.8 % (ref 2.3–15.9)
RBC.: 4.31 MIL/uL (ref 4.22–5.81)
Retic Count, Absolute: 42.7 10*3/uL (ref 19.0–186.0)
Retic Ct Pct: 1 % (ref 0.4–3.1)

## 2020-10-04 NOTE — Patient Instructions (Signed)
DUE TO COVID-19 ONLY ONE VISITOR IS ALLOWED TO COME WITH YOU AND STAY IN THE WAITING ROOM ONLY DURING PRE OP AND PROCEDURE DAY OF SURGERY. THE 1 VISITOR  MAY VISIT WITH YOU AFTER SURGERY IN YOUR PRIVATE ROOM DURING VISITING HOURS ONLY!  YOU NEED TO HAVE A COVID 19 TEST ON: 10/05/20 @ 2:40 PM THIS TEST MUST BE DONE BEFORE SURGERY,  COVID TESTING SITE [address] [place] [address], IT IS ON THE RIGHT GOING OUT [address] APPROXIMATELY  2 MINUTES PAST [employer] ON THE RIGHT. ONCE YOUR COVID TEST IS COMPLETED,  PLEASE BEGIN THE QUARANTINE INSTRUCTIONS AS OUTLINED IN YOUR HANDOUT.                Travis Wood    Your procedure is scheduled on: 10/09/20   Report to [facility] Main  Entrance   Report to short stay at: 5:30 AM     Call this number if you have problems the morning of surgery [phone]    Remember:   NO SOLID FOOD AFTER MIDNIGHT THE NIGHT PRIOR TO SURGERY. NOTHING BY MOUTH EXCEPT CLEAR LIQUIDS UNTIL: 4:15 AM . PLEASE FINISH ENSURE DRINK PER SURGEON ORDER  WHICH NEEDS TO BE COMPLETED AT : 4:15 AM.  CLEAR LIQUID DIET   Foods Allowed                                                                     Foods Excluded  Coffee and tea, regular and decaf                             liquids that you cannot  Plain Jell-O any favor except red or purple                                           see through such as: Fruit ices (not with fruit pulp)                                     milk, soups, orange juice  Iced Popsicles                                    All solid food Carbonated beverages, regular and diet                                    Cranberry, grape and apple juices Sports drinks like Gatorade Lightly seasoned clear broth or consume(fat free) Sugar, honey syrup  Sample Menu Breakfast                                Lunch  Supper Cranberry juice                    Beef broth                             Chicken broth Jell-O                                     Grape juice                           Apple juice Coffee or tea                        Jell-O                                      Popsicle                                                Coffee or tea                        Coffee or tea  _____________________________________________________________________   BRUSH YOUR TEETH MORNING OF SURGERY AND RINSE YOUR MOUTH OUT, NO CHEWING GUM CANDY OR MINTS.                             You may not have any metal on your body including hair pins and              piercings  Do not wear jewelry, lotions, powders or perfumes, deodorant             Men may shave face and neck.   Do not bring valuables to the hospital. [place].  Contacts, dentures or bridgework may not be worn into surgery.  Leave suitcase in the car. After surgery it may be brought to your room.     Patients discharged the day of surgery will not be allowed to drive home. IF YOU ARE HAVING SURGERY AND GOING HOME THE SAME DAY, YOU MUST HAVE AN ADULT TO DRIVE YOU HOME AND BE WITH YOU FOR 24 HOURS. YOU MAY GO HOME BY TAXI OR UBER OR ORTHERWISE, BUT AN ADULT MUST ACCOMPANY YOU HOME AND STAY WITH YOU FOR 24 HOURS.  Name and phone number of your driver:  Special Instructions: N/A              Please read over the following fact sheets you were given: _____________________________________________________________________         [facility] - Preparing for Surgery Before surgery, you can play an important role.  Because skin is not sterile, your skin needs to be as free of germs as possible.  You can reduce the number of germs on your skin by washing with CHG (chlorahexidine gluconate) soap before surgery.  CHG is an antiseptic cleaner which kills germs and bonds with the skin to  continue killing germs even after washing. Please DO NOT use if you have an allergy to CHG or  antibacterial soaps.  If your skin becomes reddened/irritated stop using the CHG and inform your nurse when you arrive at Short Stay. Do not shave (including legs and underarms) for at least 48 hours prior to the first CHG shower.  You may shave your face/neck. Please follow these instructions carefully:  1.  Shower with CHG Soap the night before surgery and the  morning of Surgery.  2.  If you choose to wash your hair, wash your hair first as usual with your  normal  shampoo.  3.  After you shampoo, rinse your hair and body thoroughly to remove the  shampoo.                           4.  Use CHG as you would any other liquid soap.  You can apply chg directly  to the skin and wash                       Gently with a scrungie or clean washcloth.  5.  Apply the CHG Soap to your body ONLY FROM THE NECK DOWN.   Do not use on face/ open                           Wound or open sores. Avoid contact with eyes, ears mouth and genitals (private parts).                       Wash face,  Genitals (private parts) with your normal soap.             6.  Wash thoroughly, paying special attention to the area where your surgery  will be performed.  7.  Thoroughly rinse your body with warm water from the neck down.  8.  DO NOT shower/wash with your normal soap after using and rinsing off  the CHG Soap.                9.  Pat yourself dry with a clean towel.            10.  Wear clean pajamas.            11.  Place clean sheets on your bed the night of your first shower and do not  sleep with pets. Day of Surgery : Do not apply any lotions/deodorants the morning of surgery.  Please wear clean clothes to the hospital/surgery center.  FAILURE TO FOLLOW THESE INSTRUCTIONS MAY RESULT IN THE CANCELLATION OF YOUR SURGERY PATIENT SIGNATURE_________________________________  NURSE SIGNATURE__________________________________  ________________________________________________________________________   Travis Wood  An incentive spirometer is a tool that can help keep your lungs clear and active. This tool measures how well you are filling your lungs with each breath. Taking long deep breaths may help reverse or decrease the chance of developing breathing (pulmonary) problems (especially infection) following:  A long period of time when you are unable to move or be active. BEFORE THE PROCEDURE   If the spirometer includes an indicator to show your best effort, your nurse or respiratory therapist will set it to a desired goal.  If possible, sit up straight or lean slightly forward. Try not to slouch.  Hold the incentive spirometer in an upright position. INSTRUCTIONS FOR USE  1.  Sit on the edge of your bed if possible, or sit up as far as you can in bed or on a chair. 2. Hold the incentive spirometer in an upright position. 3. Breathe out normally. 4. Place the mouthpiece in your mouth and seal your lips tightly around it. 5. Breathe in slowly and as deeply as possible, raising the piston or the ball toward the top of the column. 6. Hold your breath for 3-5 seconds or for as long as possible. Allow the piston or ball to fall to the bottom of the column. 7. Remove the mouthpiece from your mouth and breathe out normally. 8. Rest for a few seconds and repeat Steps 1 through 7 at least 10 times every 1-2 hours when you are awake. Take your time and take a few normal breaths between deep breaths. 9. The spirometer may include an indicator to show your best effort. Use the indicator as a goal to work toward during each repetition. 10. After each set of 10 deep breaths, practice coughing to be sure your lungs are clear. If you have an incision (the cut made at the time of surgery), support your incision when coughing by placing a pillow or rolled up towels firmly against it. Once you are able to get out of bed, walk around indoors and cough well. You may stop using the incentive spirometer when  instructed by your caregiver.  RISKS AND COMPLICATIONS  Take your time so you do not get dizzy or light-headed.  If you are in pain, you may need to take or ask for pain medication before doing incentive spirometry. It is harder to take a deep breath if you are having pain. AFTER USE  Rest and breathe slowly and easily.  It can be helpful to keep track of a log of your progress. Your caregiver can provide you with a simple table to help with this. If you are using the spirometer at home, follow these instructions: [place] IF:   You are having difficultly using the spirometer.  You have trouble using the spirometer as often as instructed.  Your pain medication is not giving enough relief while using the spirometer.  You develop fever of 100.5 F (38.1 C) or higher. SEEK IMMEDIATE MEDICAL CARE IF:   You cough up bloody sputum that had not been present before.  You develop fever of 102 F (38.9 C) or greater.  You develop worsening pain at or near the incision site. MAKE SURE YOU:   Understand these instructions.  Will watch your condition.  Will get help right away if you are not doing well or get worse. Document Released: 03/30/2007 Document Revised: 02/09/2012 Document Reviewed: 05/31/2007 [facility] Patient Information 2014 [place], [place].   ________________________________________________________________________

## 2020-10-05 ENCOUNTER — Other Ambulatory Visit: Payer: Self-pay

## 2020-10-05 ENCOUNTER — Other Ambulatory Visit (HOSPITAL_COMMUNITY)
Admission: RE | Admit: 2020-10-05 | Discharge: 2020-10-05 | Disposition: A | Discharge status: Home / Self Care | Payer: Medicare HMO | Source: Ambulatory Visit | Attending: Orthopedic Surgery | Admitting: Orthopedic Surgery

## 2020-10-05 ENCOUNTER — Encounter (HOSPITAL_COMMUNITY)
Admission: RE | Admit: 2020-10-05 | Discharge: 2020-10-05 | Disposition: A | Discharge status: Home / Self Care | Payer: Medicare HMO | Source: Ambulatory Visit | Attending: Orthopedic Surgery | Admitting: Orthopedic Surgery

## 2020-10-05 ENCOUNTER — Encounter (HOSPITAL_COMMUNITY): Payer: Self-pay

## 2020-10-05 DIAGNOSIS — Z01818 Encounter for other preprocedural examination: Secondary | ICD-10-CM | POA: Insufficient documentation

## 2020-10-05 DIAGNOSIS — Z20822 Contact with and (suspected) exposure to covid-19: Secondary | ICD-10-CM | POA: Diagnosis not present

## 2020-10-05 HISTORY — DX: Chronic lymphocytic leukemia of B-cell type not having achieved remission: C91.10

## 2020-10-05 LAB — SURGICAL PCR SCREEN
MRSA, PCR: NEGATIVE
Staphylococcus aureus: NEGATIVE

## 2020-10-05 LAB — CBC
HCT: 40.4 % (ref 39.0–52.0)
Hemoglobin: 13.5 g/dL (ref 13.0–17.0)
MCH: 31.4 pg (ref 26.0–34.0)
MCHC: 33.4 g/dL (ref 30.0–36.0)
MCV: 94 fL (ref 80.0–100.0)
Platelets: 120 10*3/uL — ABNORMAL LOW (ref 150–400)
RBC: 4.3 MIL/uL (ref 4.22–5.81)
RDW: 12.8 % (ref 11.5–15.5)
WBC: 23.6 10*3/uL — ABNORMAL HIGH (ref 4.0–10.5)
nRBC: 0 % (ref 0.0–0.2)

## 2020-10-05 LAB — SARS CORONAVIRUS 2 (TAT 6-24 HRS): SARS Coronavirus 2: NEGATIVE

## 2020-10-05 NOTE — Progress Notes (Signed)
Lab. Results: WBC: 23.6

## 2020-10-05 NOTE — Progress Notes (Signed)
COVID Vaccine Completed: NO Date COVID Vaccine completed: COVID vaccine manufacturer: Pfizer    Moderna   [employer]   PCP - Dr. Claretta Fraise Cardiologist -   Chest x-ray -  EKG -  Stress Test -  ECHO -  Cardiac Cath -  Pacemaker/ICD device last checked:  Sleep Study -  CPAP -   Fasting Blood Sugar -  Checks Blood Sugar _____ times a day  Blood Thinner Instructions: Aspirin Instructions: Last Dose:  Anesthesia review:   Patient denies shortness of breath, fever, cough and chest pain at PAT appointment   Patient verbalized understanding of instructions that were given to them at the PAT appointment. Patient was also instructed that they will need to review over the PAT instructions again at home before surgery.

## 2020-10-08 ENCOUNTER — Ambulatory Visit (HOSPITAL_COMMUNITY): Payer: Medicare HMO | Admitting: Hematology

## 2020-10-08 NOTE — Anesthesia Preprocedure Evaluation (Addendum)
Anesthesia Evaluation    Reviewed: Allergy & Precautions, Patient's Chart, lab work & pertinent test results  Airway Mallampati: II  TM Distance: >3 FB Neck ROM: Full    Dental  (+) Missing   Pulmonary former smoker,    Pulmonary exam normal breath sounds clear to auscultation       Cardiovascular negative cardio ROS Normal cardiovascular exam Rhythm:Regular Rate:Normal     Neuro/Psych negative neurological ROS  negative psych ROS   GI/Hepatic negative GI ROS, Neg liver ROS,   Endo/Other  negative endocrine ROS  Renal/GU negative Renal ROS     Musculoskeletal  (+) Arthritis ,   Abdominal   Peds  Hematology  (+) Blood dyscrasia (Plt 120k), , CLL   Anesthesia Other Findings   Reproductive/Obstetrics                            Anesthesia Physical Anesthesia Plan  ASA: II  Anesthesia Plan: Spinal   Post-op Pain Management:  Regional for Post-op pain   Induction: Intravenous  PONV Risk Score and Plan: 2 and Propofol infusion and Midazolam  Airway Management Planned: Natural Airway and Nasal Cannula  Additional Equipment:   Intra-op Plan:   Post-operative Plan:   Informed Consent: I have reviewed the patients History and Physical, chart, labs and discussed the procedure including the risks, benefits and alternatives for the proposed anesthesia with the patient or authorized representative who has indicated his/her understanding and acceptance.       Plan Discussed with: CRNA  Anesthesia Plan Comments:        Anesthesia Quick Evaluation

## 2020-10-09 ENCOUNTER — Encounter (HOSPITAL_COMMUNITY)
Admission: RE | Disposition: A | Discharge status: Home / Self Care | Payer: Self-pay | Source: Ambulatory Visit | Attending: Orthopedic Surgery

## 2020-10-09 ENCOUNTER — Observation Stay (HOSPITAL_COMMUNITY)
Admission: RE | Admit: 2020-10-09 | Discharge: 2020-10-10 | Disposition: A | Discharge status: Home / Self Care | Payer: Medicare HMO | Source: Ambulatory Visit | Attending: Orthopedic Surgery | Admitting: Orthopedic Surgery

## 2020-10-09 ENCOUNTER — Ambulatory Visit (HOSPITAL_COMMUNITY): Payer: Medicare HMO | Admitting: Anesthesiology

## 2020-10-09 ENCOUNTER — Other Ambulatory Visit: Payer: Self-pay

## 2020-10-09 ENCOUNTER — Encounter (HOSPITAL_COMMUNITY): Payer: Self-pay | Admitting: Orthopedic Surgery

## 2020-10-09 DIAGNOSIS — M1711 Unilateral primary osteoarthritis, right knee: Secondary | ICD-10-CM | POA: Diagnosis not present

## 2020-10-09 DIAGNOSIS — R69 Illness, unspecified: Secondary | ICD-10-CM | POA: Diagnosis not present

## 2020-10-09 DIAGNOSIS — Z96651 Presence of right artificial knee joint: Secondary | ICD-10-CM

## 2020-10-09 DIAGNOSIS — F17210 Nicotine dependence, cigarettes, uncomplicated: Secondary | ICD-10-CM | POA: Diagnosis not present

## 2020-10-09 DIAGNOSIS — Z23 Encounter for immunization: Secondary | ICD-10-CM | POA: Diagnosis not present

## 2020-10-09 DIAGNOSIS — C9110 Chronic lymphocytic leukemia of B-cell type not having achieved remission: Secondary | ICD-10-CM | POA: Insufficient documentation

## 2020-10-09 DIAGNOSIS — G8918 Other acute postprocedural pain: Secondary | ICD-10-CM | POA: Diagnosis not present

## 2020-10-09 DIAGNOSIS — E785 Hyperlipidemia, unspecified: Secondary | ICD-10-CM | POA: Diagnosis not present

## 2020-10-09 HISTORY — PX: TOTAL KNEE ARTHROPLASTY: SHX125

## 2020-10-09 LAB — TYPE AND SCREEN
ABO/RH(D): A POS
Antibody Screen: NEGATIVE

## 2020-10-09 LAB — ABO/RH: ABO/RH(D): A POS

## 2020-10-09 SURGERY — ARTHROPLASTY, KNEE, TOTAL
Anesthesia: Spinal | Site: Knee | Laterality: Right

## 2020-10-09 MED ORDER — STERILE WATER FOR IRRIGATION IR SOLN
Status: DC | PRN
  Administered 2020-10-09: 2000 mL

## 2020-10-09 MED ORDER — ROPIVACAINE HCL 7.5 MG/ML IJ SOLN
INTRAMUSCULAR | Status: DC | PRN
  Administered 2020-10-09: 20 mL via PERINEURAL

## 2020-10-09 MED ORDER — BISACODYL 10 MG RE SUPP: 10.0000 mg | Freq: Every day | RECTAL | Status: DC | PRN

## 2020-10-09 MED ORDER — PROMETHAZINE HCL 25 MG/ML IJ SOLN: 6.2500 mg | INTRAMUSCULAR | Status: DC | PRN

## 2020-10-09 MED ORDER — POLYETHYLENE GLYCOL 3350 17 G PO PACK
17.0000 g | PACK | Freq: Two times a day (BID) | ORAL | Status: DC
  Administered 2020-10-10: 17 g via ORAL
  Filled 2020-10-09 (×2): qty 1

## 2020-10-09 MED ORDER — TRANEXAMIC ACID-NACL 1000-0.7 MG/100ML-% IV SOLN
1000.0000 mg | Freq: Once | INTRAVENOUS | Status: AC
  Administered 2020-10-09: 1000 mg via INTRAVENOUS
  Filled 2020-10-09: qty 100

## 2020-10-09 MED ORDER — MIDAZOLAM HCL 2 MG/2ML IJ SOLN
INTRAMUSCULAR | Status: AC
  Filled 2020-10-09: qty 2

## 2020-10-09 MED ORDER — ALUM & MAG HYDROXIDE-SIMETH 200-200-20 MG/5ML PO SUSP: 15.0000 mL | ORAL | Status: DC | PRN

## 2020-10-09 MED ORDER — MENTHOL 3 MG MT LOZG: 1.0000 | LOZENGE | OROMUCOSAL | Status: DC | PRN

## 2020-10-09 MED ORDER — CHLORHEXIDINE GLUCONATE CLOTH 2 % EX PADS: 6.0000 | MEDICATED_PAD | Freq: Every day | CUTANEOUS | Status: DC

## 2020-10-09 MED ORDER — BUPIVACAINE IN DEXTROSE 0.75-8.25 % IT SOLN
INTRATHECAL | Status: DC | PRN
  Administered 2020-10-09: 1.8 mL via INTRATHECAL

## 2020-10-09 MED ORDER — HYDROCODONE-ACETAMINOPHEN 7.5-325 MG PO TABS
1.0000 | ORAL_TABLET | ORAL | Status: DC | PRN
  Administered 2020-10-09 (×2): 2 via ORAL
  Administered 2020-10-09: 1 via ORAL
  Filled 2020-10-09: qty 2
  Filled 2020-10-09: qty 1
  Filled 2020-10-09: qty 2

## 2020-10-09 MED ORDER — ONDANSETRON HCL 4 MG/2ML IJ SOLN: 4.0000 mg | Freq: Four times a day (QID) | INTRAMUSCULAR | Status: DC | PRN

## 2020-10-09 MED ORDER — METHOCARBAMOL 500 MG PO TABS
500.0000 mg | ORAL_TABLET | Freq: Four times a day (QID) | ORAL | Status: DC | PRN
  Administered 2020-10-09 – 2020-10-10 (×2): 500 mg via ORAL
  Filled 2020-10-09 (×2): qty 1

## 2020-10-09 MED ORDER — MIDAZOLAM HCL 5 MG/5ML IJ SOLN
INTRAMUSCULAR | Status: DC | PRN
  Administered 2020-10-09: 2 mg via INTRAVENOUS

## 2020-10-09 MED ORDER — MAGNESIUM CITRATE PO SOLN: 1.0000 | Freq: Once | ORAL | Status: DC | PRN

## 2020-10-09 MED ORDER — SODIUM CHLORIDE (PF) 0.9 % IJ SOLN
INTRAMUSCULAR | Status: AC
  Filled 2020-10-09: qty 30

## 2020-10-09 MED ORDER — METHOCARBAMOL 1000 MG/10ML IJ SOLN
500.0000 mg | Freq: Four times a day (QID) | INTRAVENOUS | Status: DC | PRN
  Filled 2020-10-09: qty 5

## 2020-10-09 MED ORDER — TRANEXAMIC ACID-NACL 1000-0.7 MG/100ML-% IV SOLN
1000.0000 mg | INTRAVENOUS | Status: AC
  Administered 2020-10-09: 1000 mg via INTRAVENOUS
  Filled 2020-10-09: qty 100

## 2020-10-09 MED ORDER — ONDANSETRON HCL 4 MG/2ML IJ SOLN
INTRAMUSCULAR | Status: AC
  Filled 2020-10-09: qty 2

## 2020-10-09 MED ORDER — METOCLOPRAMIDE HCL 5 MG/ML IJ SOLN: 5.0000 mg | Freq: Three times a day (TID) | INTRAMUSCULAR | Status: DC | PRN

## 2020-10-09 MED ORDER — POVIDONE-IODINE 10 % EX SWAB
2.0000 "application " | Freq: Once | CUTANEOUS | Status: AC
  Administered 2020-10-09: 2 via TOPICAL

## 2020-10-09 MED ORDER — LACTATED RINGERS IV SOLN: INTRAVENOUS | Status: DC

## 2020-10-09 MED ORDER — FENTANYL CITRATE (PF) 100 MCG/2ML IJ SOLN
INTRAMUSCULAR | Status: DC | PRN
  Administered 2020-10-09: 50 ug via INTRAVENOUS

## 2020-10-09 MED ORDER — DEXAMETHASONE SODIUM PHOSPHATE 10 MG/ML IJ SOLN
10.0000 mg | Freq: Once | INTRAMUSCULAR | Status: AC
  Administered 2020-10-09: 8 mg via INTRAVENOUS

## 2020-10-09 MED ORDER — KETOROLAC TROMETHAMINE 30 MG/ML IJ SOLN
INTRAMUSCULAR | Status: DC | PRN
  Administered 2020-10-09: 30 mg

## 2020-10-09 MED ORDER — HYDROCODONE-ACETAMINOPHEN 5-325 MG PO TABS
1.0000 | ORAL_TABLET | ORAL | Status: DC | PRN
  Administered 2020-10-10 (×3): 2 via ORAL
  Filled 2020-10-09 (×3): qty 2

## 2020-10-09 MED ORDER — ORAL CARE MOUTH RINSE: 15.0000 mL | Freq: Once | OROMUCOSAL | Status: AC

## 2020-10-09 MED ORDER — ACETAMINOPHEN 325 MG PO TABS: 325.0000 mg | ORAL_TABLET | Freq: Four times a day (QID) | ORAL | Status: DC | PRN

## 2020-10-09 MED ORDER — FENTANYL CITRATE (PF) 100 MCG/2ML IJ SOLN: 25.0000 ug | INTRAMUSCULAR | Status: DC | PRN

## 2020-10-09 MED ORDER — DEXAMETHASONE SODIUM PHOSPHATE 10 MG/ML IJ SOLN
10.0000 mg | Freq: Once | INTRAMUSCULAR | Status: AC
  Administered 2020-10-10: 10 mg via INTRAVENOUS
  Filled 2020-10-09: qty 1

## 2020-10-09 MED ORDER — CHLORHEXIDINE GLUCONATE 0.12 % MT SOLN
15.0000 mL | Freq: Once | OROMUCOSAL | Status: AC
  Administered 2020-10-09: 15 mL via OROMUCOSAL

## 2020-10-09 MED ORDER — KETOROLAC TROMETHAMINE 30 MG/ML IJ SOLN
INTRAMUSCULAR | Status: AC
  Filled 2020-10-09: qty 1

## 2020-10-09 MED ORDER — DEXAMETHASONE SODIUM PHOSPHATE 10 MG/ML IJ SOLN
INTRAMUSCULAR | Status: AC
  Filled 2020-10-09: qty 1

## 2020-10-09 MED ORDER — EPHEDRINE SULFATE-NACL 50-0.9 MG/10ML-% IV SOSY
PREFILLED_SYRINGE | INTRAVENOUS | Status: DC | PRN
  Administered 2020-10-09 (×3): 5 mg via INTRAVENOUS

## 2020-10-09 MED ORDER — SODIUM CHLORIDE 0.9 % IV SOLN: INTRAVENOUS | Status: DC

## 2020-10-09 MED ORDER — CLONIDINE HCL (ANALGESIA) 100 MCG/ML EP SOLN
EPIDURAL | Status: DC | PRN
  Administered 2020-10-09: 50 ug

## 2020-10-09 MED ORDER — PROPOFOL 500 MG/50ML IV EMUL
INTRAVENOUS | Status: DC | PRN
  Administered 2020-10-09: 40 ug/kg/min via INTRAVENOUS

## 2020-10-09 MED ORDER — CELECOXIB 200 MG PO CAPS
200.0000 mg | ORAL_CAPSULE | Freq: Two times a day (BID) | ORAL | Status: DC
  Administered 2020-10-09 – 2020-10-10 (×2): 200 mg via ORAL
  Filled 2020-10-09 (×2): qty 1

## 2020-10-09 MED ORDER — MORPHINE SULFATE (PF) 2 MG/ML IV SOLN: 0.5000 mg | INTRAVENOUS | Status: DC | PRN

## 2020-10-09 MED ORDER — METOCLOPRAMIDE HCL 5 MG PO TABS: 5.0000 mg | ORAL_TABLET | Freq: Three times a day (TID) | ORAL | Status: DC | PRN

## 2020-10-09 MED ORDER — ACETAMINOPHEN 500 MG PO TABS
1000.0000 mg | ORAL_TABLET | Freq: Once | ORAL | Status: AC
  Administered 2020-10-09: 1000 mg via ORAL
  Filled 2020-10-09: qty 2

## 2020-10-09 MED ORDER — CEFAZOLIN SODIUM-DEXTROSE 2-4 GM/100ML-% IV SOLN
2.0000 g | INTRAVENOUS | Status: AC
  Administered 2020-10-09: 2 g via INTRAVENOUS
  Filled 2020-10-09: qty 100

## 2020-10-09 MED ORDER — FERROUS SULFATE 325 (65 FE) MG PO TABS
325.0000 mg | ORAL_TABLET | Freq: Two times a day (BID) | ORAL | Status: DC
  Administered 2020-10-09 – 2020-10-10 (×2): 325 mg via ORAL
  Filled 2020-10-09 (×2): qty 1

## 2020-10-09 MED ORDER — PROPOFOL 500 MG/50ML IV EMUL
INTRAVENOUS | Status: AC
  Filled 2020-10-09: qty 50

## 2020-10-09 MED ORDER — ONDANSETRON HCL 4 MG PO TABS: 4.0000 mg | ORAL_TABLET | Freq: Four times a day (QID) | ORAL | Status: DC | PRN

## 2020-10-09 MED ORDER — EPHEDRINE 5 MG/ML INJ
INTRAVENOUS | Status: AC
  Filled 2020-10-09: qty 10

## 2020-10-09 MED ORDER — BUPIVACAINE-EPINEPHRINE (PF) 0.25% -1:200000 IJ SOLN
INTRAMUSCULAR | Status: AC
  Filled 2020-10-09: qty 30

## 2020-10-09 MED ORDER — SODIUM CHLORIDE 0.9 % IR SOLN
Status: DC | PRN
  Administered 2020-10-09: 1000 mL

## 2020-10-09 MED ORDER — DOCUSATE SODIUM 100 MG PO CAPS
100.0000 mg | ORAL_CAPSULE | Freq: Two times a day (BID) | ORAL | Status: DC
  Administered 2020-10-10: 100 mg via ORAL
  Filled 2020-10-09 (×2): qty 1

## 2020-10-09 MED ORDER — ATORVASTATIN CALCIUM 40 MG PO TABS: 40.0000 mg | ORAL_TABLET | Freq: Every day | ORAL | Status: DC

## 2020-10-09 MED ORDER — PHENOL 1.4 % MT LIQD: 1.0000 | OROMUCOSAL | Status: DC | PRN

## 2020-10-09 MED ORDER — SODIUM CHLORIDE (PF) 0.9 % IJ SOLN
INTRAMUSCULAR | Status: DC | PRN
  Administered 2020-10-09: 30 mL

## 2020-10-09 MED ORDER — PNEUMOCOCCAL VAC POLYVALENT 25 MCG/0.5ML IJ INJ
0.5000 mL | INJECTION | INTRAMUSCULAR | Status: AC
  Administered 2020-10-10: 0.5 mL via INTRAMUSCULAR
  Filled 2020-10-09: qty 0.5

## 2020-10-09 MED ORDER — DIPHENHYDRAMINE HCL 12.5 MG/5ML PO ELIX: 12.5000 mg | ORAL_SOLUTION | ORAL | Status: DC | PRN

## 2020-10-09 MED ORDER — ASPIRIN 81 MG PO CHEW
81.0000 mg | CHEWABLE_TABLET | Freq: Two times a day (BID) | ORAL | Status: DC
  Administered 2020-10-09 – 2020-10-10 (×2): 81 mg via ORAL
  Filled 2020-10-09 (×2): qty 1

## 2020-10-09 MED ORDER — PROPOFOL 1000 MG/100ML IV EMUL
INTRAVENOUS | Status: AC
  Filled 2020-10-09: qty 100

## 2020-10-09 MED ORDER — ONDANSETRON HCL 4 MG/2ML IJ SOLN
INTRAMUSCULAR | Status: DC | PRN
  Administered 2020-10-09: 4 mg via INTRAVENOUS

## 2020-10-09 MED ORDER — INFLUENZA VAC A&B SA ADJ QUAD 0.5 ML IM PRSY
0.5000 mL | PREFILLED_SYRINGE | INTRAMUSCULAR | Status: AC
  Administered 2020-10-10: 0.5 mL via INTRAMUSCULAR
  Filled 2020-10-09: qty 0.5

## 2020-10-09 MED ORDER — CEFAZOLIN SODIUM-DEXTROSE 2-4 GM/100ML-% IV SOLN
2.0000 g | Freq: Four times a day (QID) | INTRAVENOUS | Status: AC
  Administered 2020-10-09 (×2): 2 g via INTRAVENOUS
  Filled 2020-10-09 (×2): qty 100

## 2020-10-09 MED ORDER — BUPIVACAINE-EPINEPHRINE (PF) 0.25% -1:200000 IJ SOLN
INTRAMUSCULAR | Status: DC | PRN
  Administered 2020-10-09: 30 mL

## 2020-10-09 MED ORDER — FENTANYL CITRATE (PF) 100 MCG/2ML IJ SOLN
INTRAMUSCULAR | Status: AC
  Filled 2020-10-09: qty 2

## 2020-10-09 SURGICAL SUPPLY — 65 items
ADH SKN CLS APL DERMABOND .7 (GAUZE/BANDAGES/DRESSINGS) ×1
ATTUNE MED ANAT PAT 41 KNEE (Knees) ×2 IMPLANT
ATTUNE MED ANAT PAT 41MM KNEE (Knees) ×1 IMPLANT
ATTUNE PS FEM RT SZ 8 CEM KNEE (Femur) ×3 IMPLANT
ATTUNE PSRP INSR SZ8 7 KNEE (Insert) ×2 IMPLANT
ATTUNE PSRP INSR SZ8 7MM KNEE (Insert) ×1 IMPLANT
BAG SPEC THK2 15X12 ZIP CLS (MISCELLANEOUS)
BAG ZIPLOCK 12X15 (MISCELLANEOUS) IMPLANT
BASE TIBIAL ROT PLAT SZ 8 KNEE (Knees) ×1 IMPLANT
BLADE SAW SGTL 11.0X1.19X90.0M (BLADE) IMPLANT
BLADE SAW SGTL 13.0X1.19X90.0M (BLADE) ×3 IMPLANT
BLADE SURG SZ10 CARB STEEL (BLADE) ×6 IMPLANT
BNDG CMPR MED 15X6 ELC VLCR LF (GAUZE/BANDAGES/DRESSINGS) ×1
BNDG ELASTIC 6X15 VLCR STRL LF (GAUZE/BANDAGES/DRESSINGS) ×3 IMPLANT
BNDG ELASTIC 6X5.8 VLCR STR LF (GAUZE/BANDAGES/DRESSINGS) ×3 IMPLANT
BOWL SMART MIX CTS (DISPOSABLE) ×3 IMPLANT
BSPLAT TIB 8 CMNT ROT PLAT STR (Knees) ×1 IMPLANT
CEMENT HV SMART SET (Cement) ×6 IMPLANT
COVER SURGICAL LIGHT HANDLE (MISCELLANEOUS) ×3 IMPLANT
COVER WAND RF STERILE (DRAPES) IMPLANT
CUFF TOURN SGL QUICK 34 (TOURNIQUET CUFF) ×3
CUFF TRNQT CYL 34X4.125X (TOURNIQUET CUFF) ×1 IMPLANT
DECANTER SPIKE VIAL GLASS SM (MISCELLANEOUS) ×6 IMPLANT
DERMABOND ADVANCED (GAUZE/BANDAGES/DRESSINGS) ×2
DERMABOND ADVANCED .7 DNX12 (GAUZE/BANDAGES/DRESSINGS) ×1 IMPLANT
DRAPE U-SHAPE 47X51 STRL (DRAPES) ×3 IMPLANT
DRESSING AQUACEL AG SP 3.5X10 (GAUZE/BANDAGES/DRESSINGS) ×1 IMPLANT
DRSG AQUACEL AG SP 3.5X10 (GAUZE/BANDAGES/DRESSINGS) ×3
DRSG MEPILEX BORDER 4X12 (GAUZE/BANDAGES/DRESSINGS) ×3 IMPLANT
DURAPREP 26ML APPLICATOR (WOUND CARE) ×6 IMPLANT
ELECT REM PT RETURN 15FT ADLT (MISCELLANEOUS) ×3 IMPLANT
GLOVE BIO SURGEON STRL SZ 6 (GLOVE) ×3 IMPLANT
GLOVE BIOGEL PI IND STRL 6.5 (GLOVE) ×1 IMPLANT
GLOVE BIOGEL PI IND STRL 7.5 (GLOVE) ×1 IMPLANT
GLOVE BIOGEL PI IND STRL 8.5 (GLOVE) ×1 IMPLANT
GLOVE BIOGEL PI INDICATOR 6.5 (GLOVE) ×2
GLOVE BIOGEL PI INDICATOR 7.5 (GLOVE) ×2
GLOVE BIOGEL PI INDICATOR 8.5 (GLOVE) ×2
GLOVE ECLIPSE 8.0 STRL XLNG CF (GLOVE) ×3 IMPLANT
GLOVE ORTHO TXT STRL SZ7.5 (GLOVE) ×3 IMPLANT
GOWN STRL REUS W/ TWL LRG LVL3 (GOWN DISPOSABLE) ×1 IMPLANT
GOWN STRL REUS W/TWL 2XL LVL3 (GOWN DISPOSABLE) ×3 IMPLANT
GOWN STRL REUS W/TWL LRG LVL3 (GOWN DISPOSABLE) ×6 IMPLANT
HANDPIECE INTERPULSE COAX TIP (DISPOSABLE) ×3
HOLDER FOLEY CATH W/STRAP (MISCELLANEOUS) IMPLANT
KIT TURNOVER KIT A (KITS) IMPLANT
MANIFOLD NEPTUNE II (INSTRUMENTS) ×3 IMPLANT
NDL SAFETY ECLIPSE 18X1.5 (NEEDLE) IMPLANT
NEEDLE HYPO 18GX1.5 SHARP (NEEDLE)
NS IRRIG 1000ML POUR BTL (IV SOLUTION) ×3 IMPLANT
PACK TOTAL KNEE CUSTOM (KITS) ×3 IMPLANT
PENCIL SMOKE EVACUATOR (MISCELLANEOUS) IMPLANT
PROTECTOR NERVE ULNAR (MISCELLANEOUS) ×3 IMPLANT
SET HNDPC FAN SPRY TIP SCT (DISPOSABLE) ×1 IMPLANT
SET PAD KNEE POSITIONER (MISCELLANEOUS) ×3 IMPLANT
SUT MNCRL AB 4-0 PS2 18 (SUTURE) ×3 IMPLANT
SUT STRATAFIX PDS+ 0 24IN (SUTURE) ×3 IMPLANT
SUT VIC AB 1 CT1 36 (SUTURE) ×3 IMPLANT
SUT VIC AB 2-0 CT1 27 (SUTURE) ×9
SUT VIC AB 2-0 CT1 TAPERPNT 27 (SUTURE) ×3 IMPLANT
SYR 3ML LL SCALE MARK (SYRINGE) ×3 IMPLANT
TIBIAL BASE ROT PLAT SZ 8 KNEE (Knees) ×3 IMPLANT
TRAY FOLEY MTR SLVR 16FR STAT (SET/KITS/TRAYS/PACK) ×3 IMPLANT
WATER STERILE IRR 1000ML POUR (IV SOLUTION) ×6 IMPLANT
WRAP KNEE MAXI GEL POST OP (GAUZE/BANDAGES/DRESSINGS) ×3 IMPLANT

## 2020-10-09 NOTE — Interval H&P Note (Signed)
History and Physical Interval Note:  10/09/2020 7:04 AM  Travis Wood  has presented today for surgery, with the diagnosis of Right knee osteoarthritis.  The various methods of treatment have been discussed with the patient and family. After consideration of risks, benefits and other options for treatment, the patient has consented to  Procedure(s) with comments: TOTAL KNEE ARTHROPLASTY (Right) - 70 mins as a surgical intervention.  The patient's history has been reviewed, patient examined, no change in status, stable for surgery.  I have reviewed the patient's chart and labs.  Questions were answered to the patient's satisfaction.     [name]

## 2020-10-09 NOTE — Transfer of Care (Signed)
Immediate Anesthesia Transfer of Care Note  Patient: Travis Wood  Procedure(s) Performed: TOTAL KNEE ARTHROPLASTY (Right Knee)  Patient Location: PACU  Anesthesia Type:MAC and Spinal  Level of Consciousness: awake, alert , oriented and patient cooperative  Airway & Oxygen Therapy: Patient Spontanous Breathing and Patient connected to face mask oxygen  Post-op Assessment: Report given to RN and Post -op Vital signs reviewed and stable  Post vital signs: Reviewed and stable  Last Vitals:  Vitals Value Taken Time  BP 107/65 10/09/20 0922  Temp    Pulse 75 10/09/20 0923  Resp 17 10/09/20 0923  SpO2 100 % 10/09/20 0923  Vitals shown include unvalidated device data.  Last Pain:  Vitals:   10/09/20 0611  TempSrc: Oral      Patients Stated Pain Goal: 4 (27/07/86 7544)  Complications: No complications documented.

## 2020-10-09 NOTE — Discharge Instructions (Signed)

## 2020-10-09 NOTE — Op Note (Signed)
NAME:  [place] RECORD NO.:  [id number]                             FACILITY:  [facility]      PHYSICIAN:  Pietro Cassis. Alvan Dame, M.D.  DATE OF BIRTH:  1951/05/12      DATE OF PROCEDURE:  10/09/2020                                     OPERATIVE REPORT         PREOPERATIVE DIAGNOSIS:  Right knee osteoarthritis.      POSTOPERATIVE DIAGNOSIS:  Right knee osteoarthritis.      FINDINGS:  The patient was noted to have complete loss of cartilage and   bone-on-bone arthritis with associated osteophytes in the lateral and patellofemoral compartments of   the knee with a significant synovitis and associated effusion.  The patient had failed months of conservative treatment including medications, injection therapy, activity modification.     PROCEDURE:  Right total knee replacement.      COMPONENTS USED:  DePuy Attune rotating platform posterior stabilized knee   system, a size 8 femur, 8 tibia, size 7 mm PS AOX insert, and 41 anatomic patellar   button.      SURGEON:  Pietro Cassis. Alvan Dame, M.D.      ASSISTANT:  Griffith Citron, PA-C.      ANESTHESIA:  Regional and Spinal.      SPECIMENS:  None.      COMPLICATION:  None.      DRAINS:  None.  EBL: <200cc       TOURNIQUET TIME:   Total Tourniquet Time Documented: Thigh (Right) - 45 minutes Total: Thigh (Right) - 45 minutes  .      The patient was stable to the recovery room.      INDICATION FOR PROCEDURE:  Travis Wood is a 69 y.o. male patient of   mine.  The patient had been seen, evaluated, and treated for months conservatively in the   office with medication, activity modification, and injections.  The patient had   radiographic changes of bone-on-bone arthritis with endplate sclerosis and osteophytes noted.  Based on the radiographic changes and failed conservative measures, the patient   decided to proceed with definitive treatment, total knee replacement.  Risks of infection, DVT, component failure, need  for revision surgery, neurovascular injury were reviewed in the office setting.  The postop course was reviewed stressing the efforts to maximize post-operative satisfaction and function.  Consent was obtained for benefit of pain   relief.      PROCEDURE IN DETAIL:  The patient was brought to the operative theater.   Once adequate anesthesia, preoperative antibiotics, 2 gm of Ancef,1 gm of Tranexamic Acid, and 10 mg of Decadron administered, the patient was positioned supine with a right thigh tourniquet placed.  The  right lower extremity was prepped and draped in sterile fashion.  A time-   out was performed identifying the patient, planned procedure, and the appropriate extremity.      The right lower extremity was placed in the [facility] leg holder.  The leg was   exsanguinated, tourniquet elevated to 250 mmHg.  A midline incision was  made followed by median parapatellar arthrotomy.  Following initial   exposure, attention was first directed to the patella.  Precut   measurement was noted to be 28 mm.  I resected down to 16 mm and used a   41 anatomic patellar button to restore patellar height as well as cover the cut surface.      The lug holes were drilled and a metal shim was placed to protect the   patella from retractors and saw blade during the procedure.      At this point, attention was now directed to the femur.  The femoral   canal was opened with a drill, irrigated to try to prevent fat emboli.  An   intramedullary rod was passed at 5 degrees valgus, 12 mm of bone was   resected off the distal femur.  Following this resection, the tibia was   subluxated anteriorly.  Using the extramedullary guide, 2 mm of bone was resected off   the proximal medial tibia.  We confirmed the gap would be   stable medially and laterally with a size 5 spacer block as well as confirmed that the tibial cut was perpendicular in the coronal plane, checking with an alignment rod.      Once this was  done, I sized the femur to be a size 8 in the anterior-   posterior dimension, chose a standard component based on medial and   lateral dimension.  The size 8 rotation block was then pinned in   position anterior referenced using the C-clamp to set rotation.  The   anterior, posterior, and  chamfer cuts were made without difficulty nor   notching making certain that I was along the anterior cortex to help   with flexion gap stability.      The final box cut was made off the lateral aspect of distal femur. At this point I used the laminar spreader to open the posterior aspect of the knee to removed posterior femoral osteophytes medially and laterally.     At this point, the tibia was sized to be a size 8.  The size 8 tray was   then pinned in position through the medial third of the tubercle,   drilled, and keel punched.  Trial reduction was now carried with a 8 femur,  8 tibia, a size 7 mm PS insert, and the 41 anatomic patella botton.  The knee was brought to full extension with good flexion stability with the patella   tracking through the trochlea without application of pressure.  Given   all these findings the trial components removed.  Final components were   opened and cement was mixed.  The knee was irrigated with normal saline solution and pulse lavage.  The synovial lining was   then injected with 30 cc of 0.25% Marcaine with epinephrine, 1 cc of Toradol and 30 cc of NS for a total of 61 cc.     Final implants were then cemented onto cleaned and dried cut surfaces of bone with the knee brought to extension with a size 7 mm PS trial insert.      Once the cement had fully cured, excess cement was removed   throughout the knee.  I confirmed that I was satisfied with the range of   motion and stability, and the final size 7 mm PS AOX insert was chosen.  It was   placed into the knee.      The tourniquet had been  let down at 44 minutes.  No significant   hemostasis was required.  The  extensor mechanism was then reapproximated using #1 Vicryl and #1 Stratafix sutures with the knee   in flexion.  The   remaining wound was closed with 2-0 Vicryl and running 4-0 Monocryl.   The knee was cleaned, dried, dressed sterilely using Dermabond and   Aquacel dressing.  The patient was then   brought to recovery room in stable condition, tolerating the procedure   well.   Please note that Physician Assistant, Griffith Citron, PA-C was present for the entirety of the case, and was utilized for pre-operative positioning, peri-operative retractor management, general facilitation of the procedure and for primary wound closure at the end of the case.              Pietro Cassis Alvan Dame, M.D.    10/09/2020 8:53 AM

## 2020-10-09 NOTE — Evaluation (Signed)
Physical Therapy Evaluation Patient Details Name: Travis Wood MRN: [id number] DOB: Nov 05, 1951 Today's Date: 10/09/2020   History of Present Illness  Patient is 69 y.o. male s/p Rt TKA on 10/09/20 with PMH significant for OA, HLD, CLL.  Clinical Impression  Travis Wood is a 69 y.o. male POD 0 s/p Rt TKA. Patient reports independence with mobility at baseline. Patient is now limited by functional impairments (see PT problem list below) and requires min assist/guard for transfers and gait with RW. Patient was able to ambulate ~120 feet with RW and min assist. Patient instructed in exercise to facilitate ROM and circulation. Patient will benefit from continued skilled PT interventions to address impairments and progress towards PLOF. Acute PT will follow to progress mobility and stair training in preparation for safe discharge home.     Follow Up Recommendations Follow surgeon's recommendation for DC plan and follow-up therapies;Outpatient PT    Equipment Recommendations  Rolling walker with 5" wheels;3in1 (PT)    Recommendations for Other Services       Precautions / Restrictions Precautions Precautions: Fall Restrictions Weight Bearing Restrictions: No Other Position/Activity Restrictions: WBAT      Mobility  Bed Mobility Overal bed mobility: Needs Assistance Bed Mobility: Supine to Sit     Supine to sit: Min guard;HOB elevated     General bed mobility comments: cues for use of bed rail, no assist needed.    Transfers Overall transfer level: Needs assistance Equipment used: Rolling walker (2 wheeled) Transfers: Sit to/from Stand Sit to Stand: Min guard;Min assist;From elevated surface         General transfer comment: cues for safe hand placement on RW, light assist for power up, pt steady with rising.  Ambulation/Gait Ambulation/Gait assistance: Min guard Gait Distance (Feet): 120 Feet Assistive device: Rolling walker (2 wheeled) Gait Pattern/deviations:  Step-to pattern;Step-through pattern;Decreased stride length;Decreased weight shift to right Gait velocity: decr   General Gait Details: cues for safe proximity to RW and step pattern, no overt LOB or buckling at Rt knee. pt began with step to pattern and transitioned to step through.  Stairs            Wheelchair Mobility    Modified Rankin (Stroke Patients Only)       Balance Overall balance assessment: Needs assistance Sitting-balance support: Feet supported Sitting balance-Leahy Scale: Good     Standing balance support: During functional activity;Bilateral upper extremity supported Standing balance-Leahy Scale: Fair                               Pertinent Vitals/Pain Pain Assessment: No/denies pain    Home Living Family/patient expects to be discharged to:: Private residence Living Arrangements: Spouse/significant other Available Help at Discharge: Family Type of Home: House Home Access: Stairs to enter Entrance Stairs-Rails: Right Entrance Stairs-Number of Steps: 3 Home Layout: One level Home Equipment: None      Prior Function Level of Independence: Independent         Comments: pt wnjoys golfing 3x/week and taking care of his [employer] shepards     Hand Dominance   Dominant Hand: Right    Extremity/Trunk Assessment   Upper Extremity Assessment Upper Extremity Assessment: Overall WFL for tasks assessed    Lower Extremity Assessment Lower Extremity Assessment: Overall WFL for tasks assessed;RLE deficits/detail RLE Deficits / Details: good quad activation, no extensor lag with SLR RLE Sensation: WNL RLE Coordination: WNL    Cervical /  Trunk Assessment Cervical / Trunk Assessment: Normal  Communication   Communication: No difficulties  Cognition Arousal/Alertness: Awake/alert Behavior During Therapy: WFL for tasks assessed/performed Overall Cognitive Status: Within Functional Limits for tasks assessed                                         General Comments      Exercises Total Joint Exercises Ankle Circles/Pumps: AROM;Both;20 reps;Seated Quad Sets: AROM;Right;10 reps;Seated Heel Slides: AROM;Right;10 reps;Seated   Assessment/Plan    PT Assessment Patient needs continued PT services  PT Problem List Decreased strength;Decreased range of motion;Decreased activity tolerance;Decreased balance;Decreased mobility;Decreased knowledge of use of DME;Decreased knowledge of precautions       PT Treatment Interventions DME instruction;Gait training;Stair training;Functional mobility training;Therapeutic activities;Therapeutic exercise;Balance training;Patient/family education    PT Goals (Current goals can be found in the Care Plan section)  Acute Rehab PT Goals Patient Stated Goal: get back to "chipping" in 4 weeks. PT Goal Formulation: With patient Time For Goal Achievement: 10/16/20 Potential to Achieve Goals: Good    Frequency 7X/week   Barriers to discharge        Co-evaluation               AM-PAC PT "6 Clicks" Mobility  Outcome Measure Help needed turning from your back to your side while in a flat bed without using bedrails?: None Help needed moving from lying on your back to sitting on the side of a flat bed without using bedrails?: A Little Help needed moving to and from a bed to a chair (including a wheelchair)?: A Little Help needed standing up from a chair using your arms (e.g., wheelchair or bedside chair)?: A Little Help needed to walk in hospital room?: A Little Help needed climbing 3-5 steps with a railing? : A Little 6 Click Score: 19    End of Session Equipment Utilized During Treatment: Gait belt Activity Tolerance: Patient tolerated treatment well Patient left: in chair;with call bell/phone within reach;with chair alarm set Nurse Communication: Mobility status PT Visit Diagnosis: Difficulty in walking, not elsewhere classified (R26.2);Muscle weakness  (generalized) (M62.81)    Time: 0300-9233 PT Time Calculation (min) (ACUTE ONLY): 27 min   Charges:   PT Evaluation $PT Eval Low Complexity: 1 Low PT Treatments $Gait Training: 8-22 mins        Verner Mould, DPT Acute Rehabilitation Services  Office [phone] Pager [phone]  10/09/2020 1:47 PM

## 2020-10-09 NOTE — Care Plan (Signed)
Ortho Bundle Case Management Note  Patient Details  Name: GEORGES VICTORIO MRN: [id number] Date of Birth: 1951/04/03  R TKA on 10-09-20  DCP:  Home with wife.  1 story home with 0 ste. DME:   Has a RW.  3-in-1 ordered through [employer]. PT:  [facility].  PT eval scheduled on 10-12-20.                   DME Arranged:  3-N-1 DME Agency:  [employer]  HH Arranged:  NA [place] Agency:  NA  Additional Comments: Please contact me with any questions of if this plan should need to change.  Marianne Sofia, RN,CCM [facility]  [phone] 10/09/2020, 2:21 PM

## 2020-10-09 NOTE — Anesthesia Procedure Notes (Signed)
Anesthesia Regional Block: Adductor canal block   Pre-Anesthetic Checklist: ,, timeout performed, Correct Patient, Correct Site, Correct Laterality, Correct Procedure, Correct Position, site marked, Risks and benefits discussed,  Surgical consent,  Pre-op evaluation,  At surgeon's request and post-op pain management  Laterality: Right  Prep: chloraprep       Needles:  Injection technique: Single-shot  Needle Type: Echogenic Needle     Needle Length: 9cm  Needle Gauge: 21     Additional Needles:   Procedures:,,,, ultrasound used (permanent image in chart),,,,  Narrative:  Start time: 10/09/2020 6:45 AM End time: 10/09/2020 6:55 AM Injection made incrementally with aspirations every 5 mL.  Performed by: Personally  Anesthesiologist: Catalina Gravel, MD  Additional Notes: No pain on injection. No increased resistance to injection. Injection made in 5cc increments.  Good needle visualization.  Patient tolerated procedure well.

## 2020-10-09 NOTE — Anesthesia Procedure Notes (Addendum)
Spinal  Patient location during procedure: OR Start time: 10/09/2020 7:10 AM End time: 10/09/2020 7:18 AM Staffing Performed: anesthesiologist  Anesthesiologist: Catalina Gravel, MD Preanesthetic Checklist Completed: patient identified, IV checked, risks and benefits discussed, surgical consent, monitors and equipment checked, pre-op evaluation and timeout performed Spinal Block Patient position: sitting Prep: DuraPrep and site prepped and draped Patient monitoring: continuous pulse ox and blood pressure Approach: midline Location: L3-4 Injection technique: single-shot Needle Needle type: Pencan  Needle gauge: 24 G Additional Notes Functioning IV was confirmed and monitors were applied. Sterile prep and drape, including hand hygiene, mask and sterile gloves were used. The patient was positioned and the spine was prepped. The skin was anesthetized with lidocaine.  Free flow of clear CSF was obtained prior to injecting local anesthetic into the CSF.  The spinal needle aspirated freely following injection.  The needle was carefully withdrawn.  The patient tolerated the procedure well. Consent was obtained prior to procedure with all questions answered and concerns addressed. Risks including but not limited to bleeding, infection, nerve damage, paralysis, failed block, inadequate analgesia, allergic reaction, high spinal, itching and headache were discussed and the patient wished to proceed.   Attempt x2 by CRNA. Attempt x1 by MDA with CSF return.  Hoy Morn, MD

## 2020-10-10 ENCOUNTER — Encounter (HOSPITAL_COMMUNITY): Payer: Self-pay | Admitting: Orthopedic Surgery

## 2020-10-10 DIAGNOSIS — M1711 Unilateral primary osteoarthritis, right knee: Secondary | ICD-10-CM | POA: Diagnosis not present

## 2020-10-10 DIAGNOSIS — Z96651 Presence of right artificial knee joint: Secondary | ICD-10-CM | POA: Diagnosis not present

## 2020-10-10 LAB — BASIC METABOLIC PANEL
Anion gap: 6 (ref 5–15)
BUN: 18 mg/dL (ref 8–23)
CO2: 24 mmol/L (ref 22–32)
Calcium: 7.9 mg/dL — ABNORMAL LOW (ref 8.9–10.3)
Chloride: 106 mmol/L (ref 98–111)
Creatinine, Ser: 0.69 mg/dL (ref 0.61–1.24)
GFR, Estimated: >60 mL/min (ref >60)
Glucose, Bld: 118 mg/dL — ABNORMAL HIGH (ref 70–99)
Potassium: 4.2 mmol/L (ref 3.5–5.1)
Sodium: 136 mmol/L (ref 135–145)

## 2020-10-10 LAB — CBC
HCT: 31.5 % — ABNORMAL LOW (ref 39.0–52.0)
Hemoglobin: 10.4 g/dL — ABNORMAL LOW (ref 13.0–17.0)
MCH: 31.7 pg (ref 26.0–34.0)
MCHC: 33 g/dL (ref 30.0–36.0)
MCV: 96 fL (ref 80.0–100.0)
Platelets: 133 10*3/uL — ABNORMAL LOW (ref 150–400)
RBC: 3.28 MIL/uL — ABNORMAL LOW (ref 4.22–5.81)
RDW: 12.6 % (ref 11.5–15.5)
WBC: 41.6 10*3/uL — ABNORMAL HIGH (ref 4.0–10.5)
nRBC: 0 % (ref 0.0–0.2)

## 2020-10-10 MED ORDER — METHOCARBAMOL 500 MG PO TABS: 500.0000 mg | ORAL_TABLET | Freq: Four times a day (QID) | ORAL | 0 refills | Status: DC | PRN

## 2020-10-10 MED ORDER — ASPIRIN 81 MG PO CHEW: 81.0000 mg | CHEWABLE_TABLET | Freq: Two times a day (BID) | ORAL | 0 refills | Status: AC

## 2020-10-10 MED ORDER — CELECOXIB 200 MG PO CAPS: 200.0000 mg | ORAL_CAPSULE | Freq: Two times a day (BID) | ORAL | 0 refills | Status: DC

## 2020-10-10 MED ORDER — POLYETHYLENE GLYCOL 3350 17 G PO PACK: 17.0000 g | PACK | Freq: Two times a day (BID) | ORAL | 0 refills | Status: DC

## 2020-10-10 MED ORDER — DOCUSATE SODIUM 100 MG PO CAPS: 100.0000 mg | ORAL_CAPSULE | Freq: Two times a day (BID) | ORAL | 0 refills | Status: DC

## 2020-10-10 MED ORDER — FERROUS SULFATE 325 (65 FE) MG PO TABS: 325.0000 mg | ORAL_TABLET | Freq: Three times a day (TID) | ORAL | 0 refills | Status: DC

## 2020-10-10 MED ORDER — HYDROCODONE-ACETAMINOPHEN 7.5-325 MG PO TABS
1.0000 | ORAL_TABLET | ORAL | 0 refills | Status: DC | PRN
Start: 2020-10-10 — End: 2021-03-27

## 2020-10-10 NOTE — TOC Transition Note (Signed)
Transition of Care [facility]) - CM/SW Discharge Note   Patient Details  Name: KYLAND NO MRN: [id number] Date of Birth: 10-13-1951  Transition of Care [facility]) CM/SW Contact:  Lia Hopping, LCSW Phone Number: 10/10/2020, 11:00 AM   Clinical Narrative:    [place] Plan of care, see Ortho CM note.   DME delivered to the pt. Bedside by [employer]  Final next level of care: OP Rehab       Discharge Plan and Services                DME Arranged: 3-N-1, Walker rolling DME Agency: [employer] Date DME Agency Contacted: 10/10/20 Time DME Agency Contacted: 0900 Representative spoke with at DME Agency: Ovid Curd HH Arranged: NA [place] Agency: NA       Social Determinants of Health ([place]) Interventions    Readmission Risk Interventions No flowsheet data found.

## 2020-10-10 NOTE — Progress Notes (Signed)
     Subjective: 1 Day Post-Op Procedure(s) (LRB): TOTAL KNEE ARTHROPLASTY (Right)   Patient reports pain as moderate at time, but controlled with medications. No reported events throughout the night.  Discussed the procedure and expectations moving forward.  Ready to be discharged home, if they do well with PT.  Follow up in the clinic in 2 weeks.  Knows to call with any questions or concerns.     Patient's anticipated LOS is less than 2 midnights, meeting these requirements: - Lives within 1 hour of care - Has a competent adult at home to recover with post-op recover - NO history of  - Chronic pain requiring opiods  - Diabetes  - Coronary Artery Disease  - Heart failure  - Heart attack  - Stroke  - DVT/VTE  - Cardiac arrhythmia  - Respiratory Failure/COPD  - Renal failure  - Anemia  - Advanced Liver disease       Objective:   VITALS:   Vitals:   10/10/20 0119 10/10/20 0553  BP: 127/76 115/70  Pulse: (!) 58 (!) 59  Resp: 18 16  Temp: 97.9 F (36.6 C) 97.7 F (36.5 C)  SpO2: 99% 98%    Dorsiflexion/Plantar flexion intact Incision: dressing C/D/I No cellulitis present Compartment soft  LABS Recent Labs    10/10/20 0310  HGB 10.4*  HCT 31.5*  WBC 41.6*  PLT 133*    Recent Labs    10/10/20 0310  NA 136  K 4.2  BUN 18  CREATININE 0.69  GLUCOSE 118*     Assessment/Plan: 1 Day Post-Op Procedure(s) (LRB): TOTAL KNEE ARTHROPLASTY (Right) Foley cath d/c'ed Advance diet Up with therapy D/C IV fluids Discharge home  Follow up in 2 weeks at [facility] Follow up with OLIN,Kawehi Hostetter D in 2 weeks.  Contact information:  [facility] [address], [address] [place] [phone]          Danae Orleans PA-C  [facility]  [place] [address], [place], [place], [place] [address] Phone: [phone] [email or web address] Facebook  [employer]

## 2020-10-10 NOTE — Progress Notes (Signed)
Physical Therapy Treatment Patient Details Name: Travis Wood MRN: [id number] DOB: [birth year] Today's Date: 10/10/2020    History of Present Illness Patient is 69 y.o. male s/p Rt TKA on 10/09/20 with PMH significant for OA, HLD, CLL.    PT Comments    Pt progressing very well today. Reviewed areas below. Pt is ready to d/c home with family assist from PT standpoint.    Follow Up Recommendations  Follow surgeon's recommendation for DC plan and follow-up therapies;Outpatient PT     Equipment Recommendations  Rolling walker with 5" wheels;3in1 (PT)    Recommendations for Other Services       Precautions / Restrictions Precautions Precautions: Fall Restrictions Weight Bearing Restrictions: Yes Other Position/Activity Restrictions: WBAT    Mobility  Bed Mobility Overal bed mobility: Needs Assistance Bed Mobility: Supine to Sit     Supine to sit: Modified independent (Device/Increase time)        Transfers Overall transfer level: Needs assistance Equipment used: Rolling walker (2 wheeled) Transfers: Sit to/from Stand Sit to Stand: Supervision         General transfer comment: cues for safe hand placement   Ambulation/Gait Ambulation/Gait assistance: Supervision;Min guard Gait Distance (Feet): 130 Feet Assistive device: Rolling walker (2 wheeled) Gait Pattern/deviations: Step-to pattern;Step-through pattern;Decreased stride length;Decreased weight shift to right Gait velocity: decr   General Gait Details: cues for sequence, beginning step through, incr wt shift to RLE end of distance    Stairs Stairs: Yes Stairs assistance: Min guard Stair Management: One rail Right;One rail Left;Step to pattern;Sideways Number of Stairs: 3 General stair comments: cues for sequence and technique    Wheelchair Mobility    Modified Rankin (Stroke Patients Only)       Balance                                            Cognition  Arousal/Alertness: Awake/alert Behavior During Therapy: WFL for tasks assessed/performed Overall Cognitive Status: Within Functional Limits for tasks assessed                                        Exercises Total Joint Exercises Ankle Circles/Pumps: AROM;Both;5 reps (has been doing I'ly) Quad Sets: AROM;Both;5 reps Heel Slides: AROM;Right;10 reps Hip ABduction/ADduction: AROM;Right;10 reps Straight Leg Raises: AROM;Right;10 reps Long Arc Quad: AROM;Right;10 reps Goniometric ROM: ~ 5 to 75 degrees right knee flexion     General Comments        Pertinent Vitals/Pain Pain Assessment: 0-10 Pain Score: 3  Pain Location: right knee  Pain Descriptors / Indicators: Sore Pain Intervention(s): Limited activity within patient's tolerance;Monitored during session;Repositioned;Premedicated before session;Ice applied    Home Living                      Prior Function            PT Goals (current goals can now be found in the care plan section) Acute Rehab PT Goals Patient Stated Goal: get back to "chipping" in 4 weeks. PT Goal Formulation: With patient Time For Goal Achievement: 10/16/20 Potential to Achieve Goals: Good Progress towards PT goals: Progressing toward goals    Frequency    7X/week      PT Plan Current plan remains appropriate    Co-evaluation  AM-PAC PT "6 Clicks" Mobility   Outcome Measure  Help needed turning from your back to your side while in a flat bed without using bedrails?: None Help needed moving from lying on your back to sitting on the side of a flat bed without using bedrails?: None Help needed moving to and from a bed to a chair (including a wheelchair)?: None Help needed standing up from a chair using your arms (e.g., wheelchair or bedside chair)?: None Help needed to walk in hospital room?: A Little Help needed climbing 3-5 steps with a railing? : A Little 6 Click Score: 22    End of Session  Equipment Utilized During Treatment: Gait belt Activity Tolerance: Patient tolerated treatment well Patient left: in chair;with call bell/phone within reach;with chair alarm set Nurse Communication: Mobility status PT Visit Diagnosis: Difficulty in walking, not elsewhere classified (R26.2);Muscle weakness (generalized) (M62.81)     Time: 1006-1030 PT Time Calculation (min) (ACUTE ONLY): 24 min  Charges:  $Gait Training: 8-22 mins $Therapeutic Exercise: 8-22 mins                     Baxter Flattery, PT  Acute Rehab Dept ([place]) [phone] Pager [phone]  10/10/2020    [facility] 10/10/2020, 10:36 AM

## 2020-10-10 NOTE — Anesthesia Postprocedure Evaluation (Signed)
Anesthesia Post Note  Patient: [name]  Procedure(s) Performed: TOTAL KNEE ARTHROPLASTY (Right Knee)     Patient location during evaluation: PACU Anesthesia Type: Spinal Level of consciousness: oriented, awake and alert and awake Pain management: pain level controlled Vital Signs Assessment: post-procedure vital signs reviewed and stable Respiratory status: spontaneous breathing, respiratory function stable, patient connected to nasal cannula oxygen and nonlabored ventilation Cardiovascular status: blood pressure returned to baseline and stable Postop Assessment: no headache, no backache, no apparent nausea or vomiting, spinal receding and patient able to bend at knees Anesthetic complications: no   No complications documented.  Last Vitals:  Vitals:   10/10/20 0119 10/10/20 0553  BP: 127/76 115/70  Pulse: (!) 58 (!) 59  Resp: 18 16  Temp: 36.6 C 36.5 C  SpO2: 99% 98%    Last Pain:  Vitals:   10/10/20 0830  TempSrc:   PainSc: 4                  Catalina Gravel

## 2020-10-10 NOTE — Discharge Summary (Signed)
Patient ID: Travis Wood MRN: [id number] DOB/AGE: 07-May-1951 69 y.o.  Admit date: 10/09/2020 Discharge date: 10/10/2020  Admission Diagnoses:  Principal Problem:   Right knee OA Active Problems:   S/P right TKA   Discharge Diagnoses:  Same  Past Medical History:  Diagnosis Date  . Cataract   . CLL (chronic lymphocytic leukemia) ([place])   . Erectile dysfunction   . History of bronchitis   . History of colon polyps   . Hyperlipemia   . Osteoarthritis   . Shortness of breath dyspnea     Surgeries: Procedure(s): RIGHT TOTAL KNEE ARTHROPLASTY on 10/09/2020   Consultants: N/A  Discharged Condition: Improved  Hospital Course: JERRIC OYEN is an 69 y.o. male who was admitted 10/09/2020 for operative treatment ofOsteoarthritis of right knee. Patient has severe unremitting pain that affects sleep, daily activities, and work/hobbies. After pre-op clearance the patient was taken to the operating room on 10/09/2020 and underwent  Procedure(s): RIGHT TOTAL KNEE ARTHROPLASTY.    Patient was given perioperative antibiotics:  Anti-infectives (From admission, onward)   Start     Dose/Rate Route Frequency Ordered Stop   10/09/20 1300  ceFAZolin (ANCEF) IVPB 2g/100 mL premix        2 g 200 mL/hr over 30 Minutes Intravenous Every 6 hours 10/09/20 1019 10/09/20 2030   10/09/20 0600  ceFAZolin (ANCEF) IVPB 2g/100 mL premix        2 g 200 mL/hr over 30 Minutes Intravenous On call to O.R. 10/09/20 0534 10/09/20 0749       Patient was given sequential compression devices, early ambulation, and chemoprophylaxis to prevent DVT.  Patient benefited maximally from hospital stay and there were no complications.    Recent vital signs:  Patient Vitals for the past 24 hrs:  BP Temp Temp src Pulse Resp SpO2  10/10/20 0553 115/70 97.7 F (36.5 C) Oral (!) 59 16 98 %  10/10/20 0119 127/76 97.9 F (36.6 C) Oral (!) 58 18 99 %  10/09/20 2102 (!) 141/82 97.7 F (36.5 C) Oral 63 14 99 %  10/09/20  1804 131/76 -- -- 77 18 97 %  10/09/20 1310 122/69 -- -- 70 16 97 %  10/09/20 1215 132/75 -- -- 75 16 100 %  10/09/20 1110 115/68 (!) 97.4 F (36.3 C) Oral 64 18 99 %  10/09/20 1013 114/65 (!) 97.5 F (36.4 C) Oral 67 16 100 %  10/09/20 1000 114/66 -- -- 72 14 97 %  10/09/20 0945 104/71 97.7 F (36.5 C) -- 74 13 100 %  10/09/20 0930 (!) 110/59 -- -- 73 15 100 %  10/09/20 0921 107/65 97.6 F (36.4 C) -- 75 16 100 %     Recent laboratory studies:  Recent Labs    10/10/20 0310  WBC 41.6*  HGB 10.4*  HCT 31.5*  PLT 133*  NA 136  K 4.2  CL 106  CO2 24  BUN 18  CREATININE 0.69  GLUCOSE 118*  CALCIUM 7.9*     Discharge Medications:   Allergies as of 10/10/2020   No Known Allergies     Medication List    TAKE these medications   aspirin 81 MG chewable tablet Commonly known as: Aspirin Childrens Chew 1 tablet (81 mg total) by mouth 2 (two) times daily. Take for 4 weeks, then resume regular dose.   atorvastatin 40 MG tablet Commonly known as: LIPITOR Take 1 tablet (40 mg total) by mouth daily at 6 PM.   celecoxib 200 MG  capsule Commonly known as: CeleBREX Take 1 capsule (200 mg total) by mouth 2 (two) times daily.   docusate sodium 100 MG capsule Commonly known as: Colace Take 1 capsule (100 mg total) by mouth 2 (two) times daily.   ferrous sulfate 325 (65 FE) MG tablet Commonly known as: FerrouSul Take 1 tablet (325 mg total) by mouth 3 (three) times daily with meals for 14 days.   HYDROcodone-acetaminophen 7.5-325 MG tablet Commonly known as: Norco Take 1-2 tablets by mouth every 4 (four) hours as needed for moderate pain.   methocarbamol 500 MG tablet Commonly known as: Robaxin Take 1 tablet (500 mg total) by mouth every 6 (six) hours as needed for muscle spasms.   multivitamin with minerals Tabs tablet Take 1 tablet by mouth daily.   OLIVE LEAF EXTRACT PO Take 1 tablet by mouth in the morning and at bedtime.   polyethylene glycol 17 g  packet Commonly known as: MIRALAX / GLYCOLAX Take 17 g by mouth 2 (two) times daily.   Vitamin D (Ergocalciferol) 1.25 MG (50000 UNIT) Caps capsule Commonly known as: DRISDOL Take 1 capsule (50,000 Units total) by mouth every 7 (seven) days.            Discharge Care Instructions  (From admission, onward)         Start     Ordered   10/10/20 0000  Change dressing       Comments: Maintain surgical dressing until follow up in the clinic. If the edges start to pull up, may reinforce with tape. If the dressing is no longer working, may remove and cover with gauze and tape, but must keep the area dry and clean.  Call with any questions or concerns.   10/10/20 0803          Diagnostic Studies: No results found.  Disposition: Home  Discharge Instructions    Call MD / Call 911   Complete by: As directed    If you experience chest pain or shortness of breath, CALL 911 and be transported to the hospital emergency room.  If you develope a fever above 101 F, pus (white drainage) or increased drainage or redness at the wound, or calf pain, call your surgeon's office.   Change dressing   Complete by: As directed    Maintain surgical dressing until follow up in the clinic. If the edges start to pull up, may reinforce with tape. If the dressing is no longer working, may remove and cover with gauze and tape, but must keep the area dry and clean.  Call with any questions or concerns.   Constipation Prevention   Complete by: As directed    Drink plenty of fluids.  Prune juice may be helpful.  You may use a stool softener, such as Colace (over the counter) 100 mg twice a day.  Use MiraLax (over the counter) for constipation as needed.   Diet - low sodium heart healthy   Complete by: As directed    Discharge instructions   Complete by: As directed    Maintain surgical dressing until follow up in the clinic. If the edges start to pull up, may reinforce with tape. If the dressing is no longer  working, may remove and cover with gauze and tape, but must keep the area dry and clean.  Follow up in 2 [facility] at [facility]. Call with any questions or concerns.   Increase activity slowly as tolerated   Complete by: As directed  Weight bearing as tolerated with assist device (walker, cane, etc) as directed, use it as long as suggested by your surgeon or therapist, typically at least 4-6 weeks.   TED hose   Complete by: As directed    Use stockings (TED hose) for 2 weeks on both leg(s).  You may remove them at night for sleeping.       Follow-up Information    Paralee Cancel, MD. Go on 10/24/2020.   Specialty: Orthopedic Surgery Why: You are scheduled for a post-operative appointment on 10-24-20 at 10:45 am.  Contact information: [address] [place] [address] [phone]                Signed: Lucille Passy [facility] 10/10/2020, 8:04 AM

## 2020-10-10 NOTE — Plan of Care (Signed)
Patient dc'd all careplans met 

## 2020-10-12 ENCOUNTER — Encounter: Payer: Self-pay | Admitting: Physical Therapy

## 2020-10-12 ENCOUNTER — Ambulatory Visit: Payer: Medicare HMO | Attending: Orthopedic Surgery | Admitting: Physical Therapy

## 2020-10-12 ENCOUNTER — Other Ambulatory Visit: Payer: Self-pay

## 2020-10-12 DIAGNOSIS — M6281 Muscle weakness (generalized): Secondary | ICD-10-CM | POA: Diagnosis present

## 2020-10-12 DIAGNOSIS — M25661 Stiffness of right knee, not elsewhere classified: Secondary | ICD-10-CM | POA: Diagnosis present

## 2020-10-12 DIAGNOSIS — G8929 Other chronic pain: Secondary | ICD-10-CM | POA: Insufficient documentation

## 2020-10-12 DIAGNOSIS — R600 Localized edema: Secondary | ICD-10-CM | POA: Insufficient documentation

## 2020-10-12 DIAGNOSIS — M25561 Pain in right knee: Secondary | ICD-10-CM | POA: Insufficient documentation

## 2020-10-12 NOTE — Therapy (Signed)
[facility] [place], [place], [address] Phone: [phone]   Fax:  [phone]  Physical Therapy Evaluation  Patient Details  Name: Travis Wood MRN: [id number] Date of Birth: Jan 31, 1951 Referring Provider (PT): Paralee Cancel MD   Encounter Date: 10/12/2020   PT End of Session - 10/12/20 1159    Visit Number 1    Number of Visits 12    Date for PT Re-Evaluation 11/09/20    Authorization Type FOTO AT LEAST EVERY 5TH VISIT.  PROGRESS NOTE AT 10TH VISIT.  KX MODIFIER AFTER 15 VISITS.    PT Start Time 1115    PT Stop Time 1156    PT Time Calculation (min) 41 min    Equipment Utilized During Treatment --   FWW.   Activity Tolerance Patient tolerated treatment well    Behavior During Therapy [facility] for tasks assessed/performed           Past Medical History:  Diagnosis Date  . Cataract   . CLL (chronic lymphocytic leukemia) ([place])   . Erectile dysfunction   . History of bronchitis   . History of colon polyps   . Hyperlipemia   . Osteoarthritis   . Shortness of breath dyspnea     Past Surgical History:  Procedure Laterality Date  . APPENDECTOMY    . CATARACT EXTRACTION W/PHACO Left 10/15/2015   Procedure: CATARACT EXTRACTION PHACO AND INTRAOCULAR LENS PLACEMENT LEFT EYE;  Surgeon: Tonny Branch, MD;  Location: [facility];  Service: Ophthalmology;  Laterality: Left;  CDE:12.20  . CATARACT EXTRACTION W/PHACO Right 11/12/2015   Procedure: CATARACT EXTRACTION PHACO AND INTRAOCULAR LENS PLACEMENT RIGHT EYE:  CDE:  8.99;  Surgeon: Tonny Branch, MD;  Location: [facility];  Service: Ophthalmology;  Laterality: Right;  . KNEE SURGERY     Left x 2   . MASS EXCISION N/A 09/12/2015   Procedure: EXCISION SOFT TISSUE NEOPLASM (6 CM), BACK;  Surgeon: Aviva Signs, MD;  Location: [facility]S;  Service: General;  Laterality: N/A;  . 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   Procedure: TOTAL KNEE ARTHROPLASTY;  Surgeon: Paralee Cancel, MD;  Location: [facility];  Service: Orthopedics;  Laterality: Right;  70 mins    There were no vitals filed for this visit.    Subjective Assessment - 10/12/20 1215    Subjective COVID-19 screen performed prior to patient entering clinic.  The patient presents to the clinic today s/p right total knee replacement performed on 10/09/20.  He presented to the clinic today with a FWW.  His pain at rest today is a 4/10.  He is icing at home to decrease pain.  Movement increases pain.    Pertinent History Previous left knee surgeries, OA, CLL, h/o right shoulder pain.    How long can you walk comfortably? Around home with a FWW.    Patient Stated Goals Get back to golfing.    Currently in Pain? Yes    Pain Score 4     Pain Location Knee    Pain Orientation Right    Pain Descriptors / Indicators Aching;Throbbing    Pain Type Surgical pain    Pain Onset In the past 7 days    Pain Frequency Constant    Aggravating Factors  Movement of right knee.    Pain Relieving Factors Rest and ice.              [facility] PT Assessment - 10/12/20 0001      Assessment   Medical  Diagnosis Right total knee replacement.    Referring Provider (PT) Paralee Cancel MD    Onset Date/Surgical Date --   10/09/20 (surgery date).     Precautions   Precautions --   No ultrasound.     Restrictions   Weight Bearing Restrictions No      Balance Screen   Has the patient fallen in the past 6 months No    Has the patient had a decrease in activity level because of a fear of falling?  No    Is the patient reluctant to leave their home because of a fear of falling?  No      Home Environment   Living Environment Private residence      Prior Function   Level of Independence Independent      Observation/Other Assessments   Observations Aquacel intact over patient's right knee.  TED hose donned.    Focus on Therapeutic Outcomes (FOTO)  79% limitation.      Observation/Other Assessments-Edema    Edema Circumferential      Circumferential  Edema   Circumferential - Right RT 5 cms > LT.      ROM / Strength   AROM / PROM / Strength AROM;Strength      AROM   Overall AROM Comments In supine:  Right knee extension is -10 degrees and passive -8 degrees with flexion to 78 degrees and passive to 82 degrees.      Strength   Overall Strength Comments Unable to perform a right antigravity SLR and decreased volitional contraction of his right quadriceps musculature currently.      Palpation   Palpation comment C/o diffuse right knee pain currently.      Transfers   Comments Assist to right LE with movement on/off plinth.      Ambulation/Gait   Gait Comments Step to gait with a FWW.                      Objective measurements completed on examination: See above findings.       [facility] Adult PT Treatment/Exercise - 10/12/20 0001      Modalities   Modalities Vasopneumatic      Vasopneumatic   Number Minutes Vasopneumatic  15 minutes    Vasopnuematic Location  --   Right knee.   Vasopneumatic Pressure Low                       PT Long Term Goals - 10/12/20 1314      PT LONG TERM GOAL #1   Title Independent with a HEP.    Time 4    Period Weeks    Status New      PT LONG TERM GOAL #2   Title Full active right knee extension in order to normalize gait.    Time 4    Period Weeks    Status New      PT LONG TERM GOAL #3   Title Active right knee flexion to 120 degrees+ so the patient can perform functional tasks and do so with pain not > 2-3/10.    Time 4    Period Weeks    Status New      PT LONG TERM GOAL #4   Title Increase right hip and  knee strength to a solid 4+/5 to provide good stability for accomplishment of functional activities.    Time 4    Period Weeks    Status  New      PT LONG TERM GOAL #5   Title Perform a reciprocating stair gait with one railing with pain not > 2-3/10.    Time 4    Period Weeks    Status New                  Plan - 10/12/20 1246     Clinical Impression Statement The patient presents to OPPT s/p right total knee replacement performed on 10/09/20.  He is pleased thus far.  He presented to the clinic today using a FWW with a step-to gait pattern.  He is lacking some extension and flexion but is performing a HEP.  He has a moderate amount of edema currently.  His strength is decreased currently as well.  Patient will benefit from skilled physical therapy intervention to address deficits and pain.    Personal Factors and Comorbidities Comorbidity 1;Comorbidity 2    Comorbidities Previous left knee surgeries, OA, CLL, h/o right shoulder pain.    Examination-Activity Limitations Other;Transfers;Locomotion Level    Examination-Participation Restrictions Other    Stability/Clinical Decision Making Stable/Uncomplicated    Clinical Decision Making Low    Rehab Potential Excellent    PT Frequency 3x / week    PT Duration 4 weeks    PT Treatment/Interventions ADLs/Self Care Home Management;Cryotherapy;Electrical Stimulation;Gait training;Stair training;Functional mobility training;Therapeutic activities;Therapeutic exercise;Neuromuscular re-education;Manual techniques;Patient/family education;Passive range of motion;Vasopneumatic Device    PT Next Visit Plan Nustep with progression to bike, progress into TKA protocol, VMS to right quadriceps may be needed.  Vasopnuematic.    Consulted and Agree with Plan of Care Patient           Patient will benefit from skilled therapeutic intervention in order to improve the following deficits and impairments:  Pain, Decreased range of motion, Decreased strength, Increased edema  Visit Diagnosis: Chronic pain of right knee - Plan: PT plan of care cert/re-cert  Localized edema - Plan: PT plan of care cert/re-cert  Stiffness of right knee, not elsewhere classified - Plan: PT plan of care cert/re-cert  Muscle weakness (generalized) - Plan: PT plan of care cert/re-cert     Problem List Patient  Active Problem List   Diagnosis Date Noted  . 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 Right knee OA 10/09/2020  . CLL (chronic lymphocytic leukemia) ([place]) 06/13/2020  . Eustachian tube dysfunction 12/14/2015  . Hyperlipidemia 06/13/2015    Kennita Pavlovich, [place] MPT 10/12/2020, 1:18 PM  [facility] [address] [place], [place], [address] Phone: [phone]   Fax:  [phone]  Name: KALEV TEMME MRN: [id number] Date of Birth: Dec 16, 1950

## 2020-10-15 ENCOUNTER — Ambulatory Visit (HOSPITAL_COMMUNITY): Payer: Medicare HMO | Admitting: Hematology

## 2020-10-15 ENCOUNTER — Ambulatory Visit: Payer: Medicare HMO | Admitting: Physical Therapy

## 2020-10-15 ENCOUNTER — Other Ambulatory Visit: Payer: Self-pay

## 2020-10-15 ENCOUNTER — Other Ambulatory Visit (HOSPITAL_COMMUNITY): Payer: Medicare HMO

## 2020-10-15 DIAGNOSIS — G8929 Other chronic pain: Secondary | ICD-10-CM

## 2020-10-15 DIAGNOSIS — M25561 Pain in right knee: Secondary | ICD-10-CM | POA: Diagnosis not present

## 2020-10-15 DIAGNOSIS — M6281 Muscle weakness (generalized): Secondary | ICD-10-CM | POA: Diagnosis not present

## 2020-10-15 DIAGNOSIS — R600 Localized edema: Secondary | ICD-10-CM

## 2020-10-15 DIAGNOSIS — M25661 Stiffness of right knee, not elsewhere classified: Secondary | ICD-10-CM

## 2020-10-15 NOTE — Therapy (Signed)
[facility] [place], [place], [address] Phone: [phone]   Fax:  [phone]  Physical Therapy Treatment  Patient Details  Name: Travis Wood MRN: [id number] Date of Birth: 04/19/1951 Referring Provider (PT): Paralee Cancel MD   Encounter Date: 10/15/2020   PT End of Session - 10/15/20 1509    Visit Number 2    Number of Visits 12    Date for PT Re-Evaluation 11/09/20    Authorization Type FOTO AT LEAST EVERY 5TH VISIT.  PROGRESS NOTE AT 10TH VISIT.  KX MODIFIER AFTER 15 VISITS.    PT Start Time 1430    PT Stop Time 1519    PT Time Calculation (min) 49 min    Activity Tolerance Patient tolerated treatment well    Behavior During Therapy WFL for tasks assessed/performed           Past Medical History:  Diagnosis Date  . Cataract   . CLL (chronic lymphocytic leukemia) ([place])   . Erectile dysfunction   . History of bronchitis   . History of colon polyps   . Hyperlipemia   . Osteoarthritis   . Shortness of breath dyspnea     Past Surgical History:  Procedure Laterality Date  . APPENDECTOMY    . CATARACT EXTRACTION W/PHACO Left 10/15/2015   Procedure: CATARACT EXTRACTION PHACO AND INTRAOCULAR LENS PLACEMENT LEFT EYE;  Surgeon: Tonny Branch, MD;  Location: [facility];  Service: Ophthalmology;  Laterality: Left;  CDE:12.20  . CATARACT EXTRACTION W/PHACO Right 11/12/2015   Procedure: CATARACT EXTRACTION PHACO AND INTRAOCULAR LENS PLACEMENT RIGHT EYE:  CDE:  8.99;  Surgeon: Tonny Branch, MD;  Location: [facility];  Service: Ophthalmology;  Laterality: Right;  . KNEE SURGERY     Left x 2   . MASS EXCISION N/A 09/12/2015   Procedure: EXCISION SOFT TISSUE NEOPLASM (6 CM), BACK;  Surgeon: Aviva Signs, MD;  Location: [facility];  Service: General;  Laterality: N/A;  . 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   Procedure: TOTAL KNEE ARTHROPLASTY;  Surgeon: Paralee Cancel, MD;  Location: [facility];  Service: Orthopedics;  Laterality: Right;   70 mins    There were no vitals filed for this visit.   Subjective Assessment - 10/15/20 1522    Subjective COVID-19 screen performed prior to patient entering clinic.  Patient reports doing fairly well. 2-3/10 pain currently.    Pertinent History Previous left knee surgeries, OA, CLL, h/o right shoulder pain.    How long can you walk comfortably? Around home with a FWW.    Patient Stated Goals Get back to golfing.    Currently in Pain? Yes    Pain Score 3     Pain Location Knee    Pain Orientation Right    Pain Descriptors / Indicators Aching;Throbbing    Pain Type Surgical pain    Pain Onset In the past 7 days    Pain Frequency Constant              [facility] PT Assessment - 10/15/20 0001      Assessment   Medical Diagnosis Right total knee replacement.    Referring Provider (PT) Paralee Cancel MD      Precautions   Precautions Other (comment)    Precaution Comments no ultrasound                         [facility] Adult PT Treatment/Exercise - 10/15/20 0001      Exercises  Exercises Knee/Hip      Knee/Hip Exercises: Aerobic   Nustep Level 2 x15 mins seat 12 to 10 to increase knee flexion      Knee/Hip Exercises: Standing   Rocker Board 3 minutes      Knee/Hip Exercises: Supine   Quad Sets Strengthening;Right;Other (comment)    Quad Sets Limitations with VMS for neuro re-ed      Modalities   Modalities Marketing executive Action VMS    Electrical Stimulation Parameters 10 on/10 off, 280 usec, 2 sec ramp x10 mins    Electrical Stimulation Goals Neuromuscular facilitation      Vasopneumatic   Number Minutes Vasopneumatic  10 minutes    Vasopnuematic Location  Knee    Vasopneumatic Pressure Low    Vasopneumatic Temperature  34      Manual Therapy   Manual Therapy Passive ROM    Passive ROM gentle R knee PROM into flexion to  improve range; intermittent oscillations to decrease pain and guarding                       PT Long Term Goals - 10/12/20 1314      PT LONG TERM GOAL #1   Title Independent with a HEP.    Time 4    Period Weeks    Status New      PT LONG TERM GOAL #2   Title Full active right knee extension in order to normalize gait.    Time 4    Period Weeks    Status New      PT LONG TERM GOAL #3   Title Active right knee flexion to 120 degrees+ so the patient can perform functional tasks and do so with pain not > 2-3/10.    Time 4    Period Weeks    Status New      PT LONG TERM GOAL #4   Title Increase right hip and  knee strength to a solid 4+/5 to provide good stability for accomplishment of functional activities.    Time 4    Period Weeks    Status New      PT LONG TERM GOAL #5   Title Perform a reciprocating stair gait with one railing with pain not > 2-3/10.    Time 4    Period Weeks    Status New                 Plan - 10/15/20 1515    Clinical Impression Statement Patient responded well to therapy session with no reports of increased of pain. Patient guided through TEs with excellent form and technique. VMS for R quad neuromuscular re-education initiated today with good response. Normal response to modalities upon removal.    Personal Factors and Comorbidities Comorbidity 1;Comorbidity 2    Comorbidities Previous left knee surgeries, OA, CLL, h/o right shoulder pain.    Examination-Activity Limitations Other;Transfers;Locomotion Level    Examination-Participation Restrictions Other    Stability/Clinical Decision Making Stable/Uncomplicated    Clinical Decision Making Low    Rehab Potential Excellent    PT Frequency 3x / week    PT Duration 4 weeks    PT Treatment/Interventions ADLs/Self Care Home Management;Cryotherapy;Electrical Stimulation;Gait training;Stair training;Functional mobility training;Therapeutic activities;Therapeutic  exercise;Neuromuscular re-education;Manual techniques;Patient/family education;Passive range of motion;Vasopneumatic Device    PT Next Visit Plan Nustep with progression to bike,  progress into TKA protocol, VMS to right quadriceps may be needed.  Vasopnuematic.    Consulted and Agree with Plan of Care Patient           Patient will benefit from skilled therapeutic intervention in order to improve the following deficits and impairments:  Pain, Decreased range of motion, Decreased strength, Increased edema  Visit Diagnosis: Chronic pain of right knee  Localized edema  Stiffness of right knee, not elsewhere classified  Muscle weakness (generalized)     Problem List Patient Active Problem List   Diagnosis Date Noted  . 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 Right knee OA 10/09/2020  . CLL (chronic lymphocytic leukemia) ([place]) 06/13/2020  . Eustachian tube dysfunction 12/14/2015  . Hyperlipidemia 06/13/2015    Gabriela Eves, PT, DPT 10/15/2020, 3:23 PM  [facility] [address] [place], [place], [address] Phone: [phone]   Fax:  [phone]  Name: Travis Wood MRN: [id number] Date of Birth: 08-04-1951

## 2020-10-17 ENCOUNTER — Ambulatory Visit: Payer: Medicare HMO | Admitting: Physical Therapy

## 2020-10-17 ENCOUNTER — Other Ambulatory Visit: Payer: Self-pay

## 2020-10-17 DIAGNOSIS — M6281 Muscle weakness (generalized): Secondary | ICD-10-CM

## 2020-10-17 DIAGNOSIS — R600 Localized edema: Secondary | ICD-10-CM

## 2020-10-17 DIAGNOSIS — M25561 Pain in right knee: Secondary | ICD-10-CM | POA: Diagnosis not present

## 2020-10-17 DIAGNOSIS — M25661 Stiffness of right knee, not elsewhere classified: Secondary | ICD-10-CM | POA: Diagnosis not present

## 2020-10-17 DIAGNOSIS — G8929 Other chronic pain: Secondary | ICD-10-CM

## 2020-10-17 NOTE — Therapy (Signed)
[facility] [place], [place], [address] Phone: [phone]   Fax:  [phone]  Physical Therapy Treatment  Patient Details  Name: Travis Wood MRN: [id number] Date of Birth: 19-Oct-1951 Referring Provider (PT): Paralee Cancel MD   Encounter Date: 10/17/2020   PT End of Session - 10/17/20 1354    Visit Number 3    Number of Visits 12    Date for PT Re-Evaluation 11/09/20    Authorization Type FOTO AT LEAST EVERY 5TH VISIT.  PROGRESS NOTE AT 10TH VISIT.  KX MODIFIER AFTER 15 VISITS.    PT Start Time 0145    PT Stop Time 0230    PT Time Calculation (min) 45 min    Activity Tolerance Patient tolerated treatment well    Behavior During Therapy WFL for tasks assessed/performed           Past Medical History:  Diagnosis Date  . Cataract   . CLL (chronic lymphocytic leukemia) ([place])   . Erectile dysfunction   . History of bronchitis   . History of colon polyps   . Hyperlipemia   . Osteoarthritis   . Shortness of breath dyspnea     Past Surgical History:  Procedure Laterality Date  . APPENDECTOMY    . CATARACT EXTRACTION W/PHACO Left 10/15/2015   Procedure: CATARACT EXTRACTION PHACO AND INTRAOCULAR LENS PLACEMENT LEFT EYE;  Surgeon: Tonny Branch, MD;  Location: [facility];  Service: Ophthalmology;  Laterality: Left;  CDE:12.20  . CATARACT EXTRACTION W/PHACO Right 11/12/2015   Procedure: CATARACT EXTRACTION PHACO AND INTRAOCULAR LENS PLACEMENT RIGHT EYE:  CDE:  8.99;  Surgeon: Tonny Branch, MD;  Location: [facility];  Service: Ophthalmology;  Laterality: Right;  . KNEE SURGERY     Left x 2   . MASS EXCISION N/A 09/12/2015   Procedure: EXCISION SOFT TISSUE NEOPLASM (6 CM), BACK;  Surgeon: Aviva Signs, MD;  Location: [facility];  Service: General;  Laterality: N/A;  . 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   Procedure: TOTAL KNEE ARTHROPLASTY;  Surgeon: Paralee Cancel, MD;  Location: [facility];  Service: Orthopedics;  Laterality: Right;   70 mins    There were no vitals filed for this visit.   Subjective Assessment - 10/17/20 1350    Subjective COVID-19 screen performed prior to patient entering clinic.  Patient arrived with some pain    Pertinent History Previous left knee surgeries, OA, CLL, h/o right shoulder pain.    How long can you walk comfortably? Around home with a FWW.    Patient Stated Goals Get back to golfing.    Currently in Pain? Yes    Pain Score 3     Pain Location Knee    Pain Orientation Right    Pain Descriptors / Indicators Discomfort;Sore    Pain Type Surgical pain    Pain Onset In the past 7 days    Pain Frequency Constant    Aggravating Factors  bending knee    Pain Relieving Factors rest              [facility] PT Assessment - 10/17/20 0001      ROM / Strength   AROM / PROM / Strength AROM;PROM      AROM   AROM Assessment Site Knee    Right/Left Knee Right    Right Knee Extension -10    Right Knee Flexion 82      PROM   PROM Assessment Site Knee    Right/Left Knee Right  Right Knee Extension -8    Right Knee Flexion 90                         [facility] Adult PT Treatment/Exercise - 10/17/20 0001      Knee/Hip Exercises: Aerobic   Nustep 2 x34min for ROM      Electrical Stimulation   Electrical Stimulation Location R Wellsite geologist Action VMS    Electrical Stimulation Parameters 10/10 x71min with quad sets for activation    Electrical Stimulation Goals Neuromuscular facilitation      Vasopneumatic   Number Minutes Vasopneumatic  10 minutes    Vasopnuematic Location  Knee    Vasopneumatic Pressure Low    Vasopneumatic Temperature  34      Manual Therapy   Manual Therapy Passive ROM    Passive ROM gentle R knee PROM into flexion and ext to improve mobility; intermittent oscillations to decrease pain and guarding                       PT Long Term Goals - 10/17/20 1354      PT LONG TERM GOAL #1   Title Independent with a  HEP.    Time 4    Period Weeks    Status On-going      PT LONG TERM GOAL #2   Title Full active right knee extension in order to normalize gait.    Baseline AROM - 10 degrees 10/17/20    Time 4    Period Weeks    Status On-going      PT LONG TERM GOAL #3   Title Active right knee flexion to 120 degrees+ so the patient can perform functional tasks and do so with pain not > 2-3/10.    Baseline AROM 82 degrees 10/17/20    Time 4    Period Weeks    Status On-going      PT LONG TERM GOAL #4   Time 4    Period Weeks    Status On-going      PT LONG TERM GOAL #5   Title Perform a reciprocating stair gait with one railing with pain not > 2-3/10.    Time 4    Period Weeks    Status On-going                 Plan - 10/17/20 1417    Clinical Impression Statement Patient tolerated treatment well today. Patient has responded well to VMS for quad activation and doing well with ROM in right knee. Patient has minimal discomfort thus far and doing self stretches per reported. Patient current goals progressing.    Personal Factors and Comorbidities Comorbidity 1;Comorbidity 2    Comorbidities Previous left knee surgeries, OA, CLL, h/o right shoulder pain.    Examination-Activity Limitations Other;Transfers;Locomotion Level    Examination-Participation Restrictions Other    Stability/Clinical Decision Making Stable/Uncomplicated    Rehab Potential Excellent    PT Frequency 3x / week    PT Duration 4 weeks    PT Treatment/Interventions ADLs/Self Care Home Management;Cryotherapy;Electrical Stimulation;Gait training;Stair training;Functional mobility training;Therapeutic activities;Therapeutic exercise;Neuromuscular re-education;Manual techniques;Patient/family education;Passive range of motion;Vasopneumatic Device    PT Next Visit Plan cont with POC for Nustep with progression to bike, progress into TKA protocol, VMS to right quadriceps may be needed.  Vasopnuematic.    Consulted and  Agree with Plan of Care Patient  Patient will benefit from skilled therapeutic intervention in order to improve the following deficits and impairments:  Pain, Decreased range of motion, Decreased strength, Increased edema  Visit Diagnosis: Chronic pain of right knee  Localized edema  Stiffness of right knee, not elsewhere classified  Muscle weakness (generalized)     Problem List Patient Active Problem List   Diagnosis Date Noted  . 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 Right knee OA 10/09/2020  . CLL (chronic lymphocytic leukemia) ([place]) 06/13/2020  . Eustachian tube dysfunction 12/14/2015  . Hyperlipidemia 06/13/2015    Carmelite Violet P, PTA 10/17/2020, 2:32 PM  [facility] [place], [place], [address] Phone: [phone]   Fax:  [phone]  Name: Travis Wood MRN: [id number] Date of Birth: 1951/05/07

## 2020-10-19 ENCOUNTER — Ambulatory Visit: Payer: Medicare HMO | Admitting: *Deleted

## 2020-10-19 ENCOUNTER — Other Ambulatory Visit: Payer: Self-pay

## 2020-10-19 DIAGNOSIS — G8929 Other chronic pain: Secondary | ICD-10-CM | POA: Diagnosis not present

## 2020-10-19 DIAGNOSIS — R600 Localized edema: Secondary | ICD-10-CM

## 2020-10-19 DIAGNOSIS — M25661 Stiffness of right knee, not elsewhere classified: Secondary | ICD-10-CM | POA: Diagnosis not present

## 2020-10-19 DIAGNOSIS — M6281 Muscle weakness (generalized): Secondary | ICD-10-CM | POA: Diagnosis not present

## 2020-10-19 DIAGNOSIS — M25561 Pain in right knee: Secondary | ICD-10-CM | POA: Diagnosis not present

## 2020-10-19 NOTE — Therapy (Signed)
[facility] [place], [place], [address] Phone: [phone]   Fax:  [phone]  Physical Therapy Treatment  Patient Details  Name: Travis Wood MRN: [id number] Date of Birth: 11/01/1951 Referring Provider (PT): Paralee Cancel MD   Encounter Date: 10/19/2020   PT End of Session - 10/19/20 1201    Visit Number 4    Number of Visits 12    Date for PT Re-Evaluation 11/09/20    Authorization Type FOTO AT LEAST EVERY 5TH VISIT.  PROGRESS NOTE AT 10TH VISIT.  KX MODIFIER AFTER 15 VISITS.    PT Start Time 1115    PT Stop Time 1211    PT Time Calculation (min) 56 min    Activity Tolerance Patient tolerated treatment well           Past Medical History:  Diagnosis Date  . Cataract   . CLL (chronic lymphocytic leukemia) ([place])   . Erectile dysfunction   . History of bronchitis   . History of colon polyps   . Hyperlipemia   . Osteoarthritis   . Shortness of breath dyspnea     Past Surgical History:  Procedure Laterality Date  . APPENDECTOMY    . CATARACT EXTRACTION W/PHACO Left 10/15/2015   Procedure: CATARACT EXTRACTION PHACO AND INTRAOCULAR LENS PLACEMENT LEFT EYE;  Surgeon: Tonny Branch, MD;  Location: [facility];  Service: Ophthalmology;  Laterality: Left;  CDE:12.20  . CATARACT EXTRACTION W/PHACO Right 11/12/2015   Procedure: CATARACT EXTRACTION PHACO AND INTRAOCULAR LENS PLACEMENT RIGHT EYE:  CDE:  8.99;  Surgeon: Tonny Branch, MD;  Location: [facility];  Service: Ophthalmology;  Laterality: Right;  . KNEE SURGERY     Left x 2   . MASS EXCISION N/A 09/12/2015   Procedure: EXCISION SOFT TISSUE NEOPLASM (6 CM), BACK;  Surgeon: Aviva Signs, MD;  Location: [facility];  Service: General;  Laterality: N/A;  . 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   Procedure: TOTAL KNEE ARTHROPLASTY;  Surgeon: Paralee Cancel, MD;  Location: [facility];  Service: Orthopedics;  Laterality: Right;  70 mins    There were no vitals filed for this visit.    Subjective Assessment - 10/19/20 1117    Subjective COVID-19 screen performed prior to patient entering clinic.  Patient arrived with some pain 3-4/10. To MD next week    Pertinent History Previous left knee surgeries, OA, CLL, h/o right shoulder pain.    How long can you walk comfortably? Around home with a FWW.    Patient Stated Goals Get back to golfing.    Currently in Pain? Yes    Pain Score 3     Pain Location Knee    Pain Orientation Right    Pain Descriptors / Indicators Sore;Discomfort    Pain Type Surgical pain    Pain Onset In the past 7 days                             [facility] Adult PT Treatment/Exercise - 10/19/20 0001      Knee/Hip Exercises: Aerobic   Nustep L4 x 15 mins      Knee/Hip Exercises: Standing   Rocker Board 3 minutes      Knee/Hip Exercises: Supine   Quad Sets Strengthening;Right;Other (comment)   with VMS x 10 mins     Modalities   Modalities Tourist information centre manager  Electrical Stimulation Action VMS    Electrical Stimulation Parameters 10/10secs on/off x 10 mins    Electrical Stimulation Goals Neuromuscular facilitation      Vasopneumatic   Number Minutes Vasopneumatic  10 minutes    Vasopnuematic Location  Knee    Vasopneumatic Pressure Low    Vasopneumatic Temperature  34      Manual Therapy   Manual Therapy Passive ROM    Passive ROM gentle R knee PROM into ext to improve mobility; intermittent oscillations to decrease pain and guarding                       PT Long Term Goals - 10/17/20 1354      PT LONG TERM GOAL #1   Title Independent with a HEP.    Time 4    Period Weeks    Status On-going      PT LONG TERM GOAL #2   Title Full active right knee extension in order to normalize gait.    Baseline AROM - 10 degrees 10/17/20    Time 4    Period Weeks    Status On-going      PT LONG TERM GOAL #3   Title  Active right knee flexion to 120 degrees+ so the patient can perform functional tasks and do so with pain not > 2-3/10.    Baseline AROM 82 degrees 10/17/20    Time 4    Period Weeks    Status On-going      PT LONG TERM GOAL #4   Time 4    Period Weeks    Status On-going      PT LONG TERM GOAL #5   Title Perform a reciprocating stair gait with one railing with pain not > 2-3/10.    Time 4    Period Weeks    Status On-going                 Plan - 10/19/20 1201    Clinical Impression Statement Pt arrived today doing fairly well with RT knee and will F/U with MD next week. Rx focused on ROM and quad activation RT LE. Extension deficit -8 degrees. Normal vaso response.    Personal Factors and Comorbidities Comorbidity 1;Comorbidity 2    Comorbidities Previous left knee surgeries, OA, CLL, h/o right shoulder pain.    Examination-Activity Limitations Other;Transfers;Locomotion Level    Rehab Potential Excellent    PT Frequency 3x / week    PT Duration 4 weeks    PT Treatment/Interventions ADLs/Self Care Home Management;Cryotherapy;Electrical Stimulation;Gait training;Stair training;Functional mobility training;Therapeutic activities;Therapeutic exercise;Neuromuscular re-education;Manual techniques;Patient/family education;Passive range of motion;Vasopneumatic Device    PT Next Visit Plan cont with POC for Nustep with progression to bike, progress into TKA protocol, VMS to right quadriceps may be needed.  Vasopnuematic.    Consulted and Agree with Plan of Care Patient           Patient will benefit from skilled therapeutic intervention in order to improve the following deficits and impairments:     Visit Diagnosis: Chronic pain of right knee  Localized edema  Stiffness of right knee, not elsewhere classified  Muscle weakness (generalized)     Problem List Patient Active Problem List   Diagnosis Date Noted  . 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 Right knee OA 10/09/2020  .  CLL (chronic lymphocytic leukemia) ([place]) 06/13/2020  . Eustachian tube dysfunction 12/14/2015  . Hyperlipidemia 06/13/2015    Deshea Pooley,CHRIS, PTA 10/19/2020,  12:20 PM  [facility] [place], [place], [address] Phone: [phone]   Fax:  [phone]  Name: ALFIO LOESCHER MRN: [id number] Date of Birth: September 18, 1951

## 2020-10-22 ENCOUNTER — Other Ambulatory Visit: Payer: Self-pay

## 2020-10-22 ENCOUNTER — Ambulatory Visit: Payer: Medicare HMO | Admitting: Physical Therapy

## 2020-10-22 DIAGNOSIS — R600 Localized edema: Secondary | ICD-10-CM | POA: Diagnosis not present

## 2020-10-22 DIAGNOSIS — M25661 Stiffness of right knee, not elsewhere classified: Secondary | ICD-10-CM

## 2020-10-22 DIAGNOSIS — M6281 Muscle weakness (generalized): Secondary | ICD-10-CM

## 2020-10-22 DIAGNOSIS — G8929 Other chronic pain: Secondary | ICD-10-CM

## 2020-10-22 DIAGNOSIS — M25561 Pain in right knee: Secondary | ICD-10-CM | POA: Diagnosis not present

## 2020-10-22 NOTE — Therapy (Signed)
[facility] [place], [place], [address] Phone: [phone]   Fax:  [phone]  Physical Therapy Treatment  Patient Details  Name: Travis Wood MRN: [id number] Date of Birth: [birth year] Referring Provider (PT): Paralee Cancel MD   Encounter Date: 10/22/2020   PT End of Session - 10/22/20 1346    Visit Number 5    Number of Visits 12    Date for PT Re-Evaluation 11/09/20    Authorization Type FOTO AT LEAST EVERY 5TH VISIT.  PROGRESS NOTE AT 10TH VISIT.  KX MODIFIER AFTER 15 VISITS.    PT Start Time 0144    PT Stop Time 0229    PT Time Calculation (min) 45 min    Activity Tolerance Patient tolerated treatment well    Behavior During Therapy [facility] for tasks assessed/performed           Past Medical History:  Diagnosis Date  . Cataract   . CLL (chronic lymphocytic leukemia) ([place])   . Erectile dysfunction   . History of bronchitis   . History of colon polyps   . Hyperlipemia   . Osteoarthritis   . Shortness of breath dyspnea     Past Surgical History:  Procedure Laterality Date  . APPENDECTOMY    . CATARACT EXTRACTION W/PHACO Left 10/15/2015   Procedure: CATARACT EXTRACTION PHACO AND INTRAOCULAR LENS PLACEMENT LEFT EYE;  Surgeon: Tonny Branch, MD;  Location: [facility];  Service: Ophthalmology;  Laterality: Left;  CDE:12.20  . CATARACT EXTRACTION W/PHACO Right 11/12/2015   Procedure: CATARACT EXTRACTION PHACO AND INTRAOCULAR LENS PLACEMENT RIGHT EYE:  CDE:  8.99;  Surgeon: Tonny Branch, MD;  Location: [facility];  Service: Ophthalmology;  Laterality: Right;  . KNEE SURGERY     Left x 2   . MASS EXCISION N/A 09/12/2015   Procedure: EXCISION SOFT TISSUE NEOPLASM (6 CM), BACK;  Surgeon: Aviva Signs, MD;  Location: [facility];  Service: General;  Laterality: N/A;  . 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   Procedure: TOTAL KNEE ARTHROPLASTY;  Surgeon: Paralee Cancel, MD;  Location: [facility];  Service: Orthopedics;  Laterality: Right;   70 mins    There were no vitals filed for this visit.   Subjective Assessment - 10/22/20 1345    Subjective COVID-19 screen performed prior to patient entering clinic.  Patient arrived with reported stiffness in knee    Pertinent History Previous left knee surgeries, OA, CLL, h/o right shoulder pain.    How long can you walk comfortably? Around home with a FWW.    Patient Stated Goals Get back to golfing.    Currently in Pain? Yes    Pain Score 3     Pain Location Knee    Pain Orientation Right    Pain Descriptors / Indicators Discomfort;Tightness    Pain Type Surgical pain    Pain Onset 1 to 4 weeks ago    Pain Frequency Constant    Aggravating Factors  bening knee    Pain Relieving Factors at rest              [facility] PT Assessment - 10/22/20 0001      AROM   AROM Assessment Site Knee    Right/Left Knee Right    Right Knee Extension -9    Right Knee Flexion 85      PROM   PROM Assessment Site Knee    Right/Left Knee Right    Right Knee Extension -7    Right Knee Flexion  64                         [facility] Adult PT Treatment/Exercise - 10/22/20 0001      Knee/Hip Exercises: Aerobic   Nustep L4 x 14 mins      Knee/Hip Exercises: Standing   Rocker Board --      Child psychotherapist Action VMS to [employer]    Electrical Stimulation Parameters 10/10 with SAQ for activation    Electrical Stimulation Goals Neuromuscular facilitation      Vasopneumatic   Number Minutes Vasopneumatic  10 minutes    Vasopnuematic Location  Knee    Vasopneumatic Pressure Low    Vasopneumatic Temperature  34      Manual Therapy   Manual Therapy Passive ROM    Passive ROM gentle R knee PROM into flex/ext to improve mobility                       PT Long Term Goals - 10/22/20 1348      PT LONG TERM GOAL #1   Title Independent with a HEP.    Time 4    Status On-going      PT LONG TERM  GOAL #2   Title Full active right knee extension in order to normalize gait.    Baseline AROM - 9 degrees 10/22/20    Time 4    Period Weeks    Status On-going      PT LONG TERM GOAL #3   Title Active right knee flexion to 120 degrees+ so the patient can perform functional tasks and do so with pain not > 2-3/10.    Baseline AROM 85 degrees 10/22/20    Time 4    Period Weeks    Status On-going      PT LONG TERM GOAL #4   Title Increase right hip and  knee strength to a solid 4+/5 to provide good stability for accomplishment of functional activities.    Time 4    Period Weeks    Status On-going      PT LONG TERM GOAL #5   Title Perform a reciprocating stair gait with one railing with pain not > 2-3/10.    Time 4    Period Weeks    Status On-going                 Plan - 10/22/20 1413    Clinical Impression Statement Patient tolerated treatment well today. Patient feels tightness in knee and little pain. Patient has improved with flexion and ext ROM today and progressing with quad activation. Patient current goals progressing. MD F/U is wednesday.    Personal Factors and Comorbidities Comorbidity 1;Comorbidity 2    Comorbidities Previous left knee surgeries, OA, CLL, h/o right shoulder pain.    Examination-Activity Limitations Other;Transfers;Locomotion Level    Examination-Participation Restrictions Other    Stability/Clinical Decision Making Stable/Uncomplicated    Rehab Potential Excellent    PT Frequency 3x / week    PT Duration 4 weeks    PT Treatment/Interventions ADLs/Self Care Home Management;Cryotherapy;Electrical Stimulation;Gait training;Stair training;Functional mobility training;Therapeutic activities;Therapeutic exercise;Neuromuscular re-education;Manual techniques;Patient/family education;Passive range of motion;Vasopneumatic Device    PT Next Visit Plan cont with POC for Nustep with progression to bike, progress ROM and PRE's    Consulted and Agree with Plan  of Care Patient  Patient will benefit from skilled therapeutic intervention in order to improve the following deficits and impairments:  Pain, Decreased range of motion, Decreased strength, Increased edema  Visit Diagnosis: Chronic pain of right knee  Localized edema  Stiffness of right knee, not elsewhere classified  Muscle weakness (generalized)     Problem List Patient Active Problem List   Diagnosis Date Noted  . 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 Right knee OA 10/09/2020  . CLL (chronic lymphocytic leukemia) ([place]) 06/13/2020  . Eustachian tube dysfunction 12/14/2015  . Hyperlipidemia 06/13/2015    Ladean Raya, PTA 10/22/20 2:30 PM   [facility] [place], [place], [address] Phone: [phone]   Fax:  [phone]  Name: MANSOOR HILLYARD MRN: [id number] Date of Birth: [birth year]

## 2020-10-24 ENCOUNTER — Encounter: Payer: Medicare HMO | Admitting: Physical Therapy

## 2020-10-29 ENCOUNTER — Inpatient Hospital Stay (HOSPITAL_BASED_OUTPATIENT_CLINIC_OR_DEPARTMENT_OTHER): Payer: Medicare HMO | Admitting: Hematology

## 2020-10-29 ENCOUNTER — Other Ambulatory Visit: Payer: Self-pay

## 2020-10-29 ENCOUNTER — Ambulatory Visit: Payer: Medicare HMO | Admitting: Physical Therapy

## 2020-10-29 ENCOUNTER — Encounter (HOSPITAL_COMMUNITY): Payer: Self-pay | Admitting: Hematology

## 2020-10-29 ENCOUNTER — Encounter: Payer: Self-pay | Admitting: Physical Therapy

## 2020-10-29 VITALS — BP 101/46 | HR 72 | Temp 98.1°F | Resp 14 | Wt 223.2 lb

## 2020-10-29 DIAGNOSIS — M6281 Muscle weakness (generalized): Secondary | ICD-10-CM

## 2020-10-29 DIAGNOSIS — C9110 Chronic lymphocytic leukemia of B-cell type not having achieved remission: Secondary | ICD-10-CM

## 2020-10-29 DIAGNOSIS — M25661 Stiffness of right knee, not elsewhere classified: Secondary | ICD-10-CM | POA: Diagnosis not present

## 2020-10-29 DIAGNOSIS — M25561 Pain in right knee: Secondary | ICD-10-CM | POA: Diagnosis not present

## 2020-10-29 DIAGNOSIS — G8929 Other chronic pain: Secondary | ICD-10-CM

## 2020-10-29 DIAGNOSIS — R600 Localized edema: Secondary | ICD-10-CM | POA: Diagnosis not present

## 2020-10-29 NOTE — Progress Notes (Signed)
Travis Wood, [place] [id number]   CLINIC:  Medical Oncology/Hematology  PCP:  Claretta Fraise, MD [place] [place] [id number]  [phone]  REASON FOR VISIT:  Follow-up for CLL  PRIOR THERAPY: None  CURRENT THERAPY: Observation  INTERVAL HISTORY:  Travis Wood, a 69 y.o. male, returns for routine follow-up for his CLL. Travis Wood was last seen on 06/13/2020.  Today Travis Wood reports feeling well. Travis Wood continues doing therapy for his right knee replacement which happened on 11/9. Travis Wood has some limitations, but Travis Wood is able to ambulate well. Travis Wood denies having any F/C, night sweats or recent infections in the last 6 months.   REVIEW OF SYSTEMS:  Review of Systems  Constitutional: Negative for appetite change, chills, diaphoresis, fatigue and fever.  Respiratory: Positive for cough (dry) and shortness of breath (w/ exertion).   Gastrointestinal: Positive for diarrhea (intermittent), nausea and vomiting (intermittent).  Hematological: Bruises/bleeds easily.  All other systems reviewed and are negative.   PAST MEDICAL/SURGICAL HISTORY:  Past Medical History:  Diagnosis Date  . Cataract   . CLL (chronic lymphocytic leukemia) ([place])   . Erectile dysfunction   . History of bronchitis   . History of colon polyps   . Hyperlipemia   . Osteoarthritis   . Shortness of breath dyspnea    Past Surgical History:  Procedure Laterality Date  . APPENDECTOMY    . CATARACT EXTRACTION W/PHACO Left 10/15/2015   Procedure: CATARACT EXTRACTION PHACO AND INTRAOCULAR LENS PLACEMENT LEFT EYE;  Surgeon: Tonny Branch, MD;  Location: [facility];  Service: Ophthalmology;  Laterality: Left;  CDE:12.20  . CATARACT EXTRACTION W/PHACO Right 11/12/2015   Procedure: CATARACT EXTRACTION PHACO AND INTRAOCULAR LENS PLACEMENT RIGHT EYE:  CDE:  8.99;  Surgeon: Tonny Branch, MD;  Location: [facility];  Service: Ophthalmology;  Laterality: Right;  . KNEE SURGERY     Left x 2   . MASS EXCISION  N/A 09/12/2015   Procedure: EXCISION SOFT TISSUE NEOPLASM (6 CM), BACK;  Surgeon: Aviva Signs, MD;  Location: [facility];  Service: General;  Laterality: N/A;  . 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   Procedure: TOTAL KNEE ARTHROPLASTY;  Surgeon: Paralee Cancel, MD;  Location: [facility];  Service: Orthopedics;  Laterality: Right;  70 mins    SOCIAL HISTORY:  Social History   Socioeconomic History  . Marital status: Single    Spouse name: Not on file  . Number of children: 2  . Years of education: Not on file  . Highest education level: Not on file  Occupational History  . Occupation: RETIRED  Tobacco Use  . Smoking status: Former Smoker    Packs/day: 0.00    Years: 0.00    Pack years: 0.00    Types: Cigarettes, Pipe    Quit date: 09/10/1991    Years since quitting: 29.1  . Smokeless tobacco: Never Used  Vaping Use  . Vaping Use: Never used  Substance and Sexual Activity  . Alcohol use: Yes    Alcohol/week: 1.0 standard drink    Types: 1 Cans of beer per week    Comment: occasional  . Drug use: Yes    Frequency: 1.0 times per week    Types: Marijuana  . Sexual activity: Yes  Other Topics Concern  . Not on file  Social History Narrative   0 caffeine drinks daily    Social Determinants of Health   Financial Resource Strain: Low Risk   . Difficulty  of Paying Living Expenses: Not hard at all  Food Insecurity: No Food Insecurity  . Worried About Charity fundraiser in the Last Year: Never true  . Ran Out of Food in the Last Year: Never true  Transportation Needs: No Transportation Needs  . Lack of Transportation (Medical): No  . Lack of Transportation (Non-Medical): No  Physical Activity: Sufficiently Active  . Days of Exercise per Week: 7 days  . Minutes of Exercise per Session: 30 min  Stress: No Stress Concern Present  . Feeling of Stress : Not at all  Social Connections: Moderately Integrated  . Frequency of Communication with Friends and Family: More than three  times a week  . Frequency of Social Gatherings with Friends and Family: More than three times a week  . Attends Religious Services: 1 to 4 times per year  . Active Member of Clubs or Organizations: No  . Attends Archivist Meetings: Never  . Marital Status: Living with partner  Intimate Partner Violence: Not At Risk  . Fear of Current or Ex-Partner: No  . Emotionally Abused: No  . Physically Abused: No  . Sexually Abused: No    FAMILY HISTORY:  Family History  Problem Relation Age of Onset  . Colon cancer Father        age 48   . Colon polyps Father   . Heart disease Father        Heart failure  . Heart disease Mother   . Cancer Sister        Uterine, and ovarian  . Hypertension Brother   . Obesity Sister   . Heart disease Maternal Grandmother   . Esophageal cancer Neg Hx   . Liver cancer Neg Hx   . Pancreatic cancer Neg Hx   . Rectal cancer Neg Hx   . Stomach cancer Neg Hx     CURRENT MEDICATIONS:  Current Outpatient Medications  Medication Sig Dispense Refill  . aspirin (ASPIRIN CHILDRENS) 81 MG chewable tablet Chew 1 tablet (81 mg total) by mouth 2 (two) times daily. Take for 4 weeks, then resume regular dose. 60 tablet 0  . atorvastatin (LIPITOR) 40 MG tablet Take 1 tablet (40 mg total) by mouth daily at 6 PM. 90 tablet 1  . celecoxib (CELEBREX) 200 MG capsule Take 1 capsule (200 mg total) by mouth 2 (two) times daily. 60 capsule 0  . docusate sodium (COLACE) 100 MG capsule Take 1 capsule (100 mg total) by mouth 2 (two) times daily. 28 capsule 0  . ferrous sulfate (FERROUSUL) 325 (65 FE) MG tablet Take 1 tablet (325 mg total) by mouth 3 (three) times daily with meals for 14 days. 42 tablet 0  . HYDROcodone-acetaminophen (NORCO) 7.5-325 MG tablet Take 1-2 tablets by mouth every 4 (four) hours as needed for moderate pain. 60 tablet 0  . methocarbamol (ROBAXIN) 500 MG tablet Take 1 tablet (500 mg total) by mouth every 6 (six) hours as needed for muscle spasms. 40  tablet 0  . Multiple Vitamin (MULTIVITAMIN WITH MINERALS) TABS tablet Take 1 tablet by mouth daily.    Marland Kitchen OLIVE LEAF EXTRACT PO Take 1 tablet by mouth in the morning and at bedtime.    . polyethylene glycol (MIRALAX / GLYCOLAX) 17 g packet Take 17 g by mouth 2 (two) times daily. 28 packet 0  . Vitamin D, Ergocalciferol, (DRISDOL) 1.25 MG (50000 UNIT) CAPS capsule Take 1 capsule (50,000 Units total) by mouth every 7 (seven) days. 13 capsule  3   No current facility-administered medications for this visit.    ALLERGIES:  No Known Allergies  PHYSICAL EXAM:  Performance status (ECOG): 0 - Asymptomatic  Vitals:   10/29/20 1516  BP: (!) 101/46  Pulse: 72  Resp: 14  Temp: 98.1 F (36.7 C)  SpO2: 98%   Wt Readings from Last 3 Encounters:  10/29/20 223 lb 3.2 oz (101.2 kg)  10/09/20 201 lb (91.2 kg)  10/05/20 201 lb (91.2 kg)   Physical Exam Vitals reviewed.  Constitutional:      Appearance: Normal appearance.  Cardiovascular:     Rate and Rhythm: Normal rate and regular rhythm.     Pulses: Normal pulses.     Heart sounds: Normal heart sounds.  Pulmonary:     Effort: Pulmonary effort is normal.     Breath sounds: Normal breath sounds.  Abdominal:     Palpations: Abdomen is soft. There is no hepatomegaly, splenomegaly or mass.     Tenderness: There is no abdominal tenderness.     Hernia: No hernia is present.  Musculoskeletal:     Right lower leg: Edema present.     Left lower leg: No edema.  Lymphadenopathy:     Upper Body:     Right upper body: No supraclavicular, axillary or pectoral adenopathy.     Left upper body: No supraclavicular, axillary or pectoral adenopathy.     Lower Body: No right inguinal adenopathy. No left inguinal adenopathy.  Neurological:     General: No focal deficit present.     Mental Status: Travis Wood is alert and oriented to person, place, and time.  Psychiatric:        Mood and Affect: Mood normal.        Behavior: Behavior normal.     LABORATORY  DATA:  I have reviewed the labs as listed.  CBC Latest Ref Rng & Units 10/10/2020 10/05/2020 10/01/2020  WBC 4.0 - 10.5 K/uL 41.6(H) 23.6(H) 31.3(H)  Hemoglobin 13.0 - 17.0 g/dL 10.4(L) 13.5 13.6  Hematocrit 39 - 52 % 31.5(L) 40.4 41.0  Platelets 150 - 400 K/uL 133(L) 120(L) 118(L)   CMP Latest Ref Rng & Units 10/10/2020 10/01/2020 05/30/2020  Glucose 70 - 99 mg/dL 118(H) 95 102(H)  BUN 8 - 23 mg/dL 18 18 17   Creatinine 0.61 - 1.24 mg/dL 0.69 0.82 0.94  Sodium 135 - 145 mmol/L 136 139 142  Potassium 3.5 - 5.1 mmol/L 4.2 4.4 4.1  Chloride 98 - 111 mmol/L 106 103 106  CO2 22 - 32 mmol/L 24 27 23   Calcium 8.9 - 10.3 mg/dL 7.9(L) 9.0 9.1  Total Protein 6.5 - 8.1 g/dL - 6.5 6.2  Total Bilirubin 0.3 - 1.2 mg/dL - 0.8 0.5  Alkaline Phos 38 - 126 U/L - 76 93  AST 15 - 41 U/L - 24 17  ALT 0 - 44 U/L - 23 13      Component Value Date/Time   RBC 3.28 (L) 10/10/2020 0310   MCV 96.0 10/10/2020 0310   MCV 91 06/06/2020 1014   MCH 31.7 10/10/2020 0310   MCHC 33.0 10/10/2020 0310   RDW 12.6 10/10/2020 0310   RDW 12.9 06/06/2020 1014   LYMPHSABS 22.0 (H) 10/01/2020 1237   LYMPHSABS 21.3 (H) 06/06/2020 1014   MONOABS 4.1 (H) 10/01/2020 1237   EOSABS 0.0 10/01/2020 1237   EOSABS 0.1 06/06/2020 1014   BASOSABS 0.0 10/01/2020 1237   BASOSABS 0.1 06/06/2020 1014   Lab Results  Component Value Date  LDH 143 10/01/2020   LDH 123 01/26/2019    DIAGNOSTIC IMAGING:  I have independently reviewed the scans and discussed with the patient. No results found.   ASSESSMENT:  1.  CLL: -Flow cytometry on 01/26/2019 showed monoclonal B-cell population consistent with CLL. -Travis Wood does not have any fevers or night sweats.  Travis Wood lost about 40 pounds in the last 1 year but has drastically modified his diet.  No recurrent infections. -Physical exam did not reveal any palpable splenomegaly or lymphadenopathy.  Travis Wood is fairly active and plays golf about 3 times a week.  Travis Wood quit smoking 30 years ago, smoked 1  pack/day for 10 years. -CBC from 06/06/2020 shows white count 28.7, hemoglobin 14.2, platelet count 121.  2.  Family history: -Mother had some kind of "leukemia".  Father had colon cancer.   PLAN:  1.  Stage 0 CLL: -We reviewed labs from 10/10/2020 which showed white count 41.6 up from 30 K.  Hemoglobin dropped to 10.4. -However Travis Wood had right knee replacement surgery about 2 weeks ago. -Physical examination did not reveal any palpable adenopathy or splenomegaly.  No infections noted. -Hence I have recommended follow-up in 4 months with repeat labs and physical exam.  Orders placed this encounter:  No orders of the defined types were placed in this encounter.    Derek Jack, MD [place] [phone]   I, Milinda Antis, am acting as a scribe for Dr. Sanda Linger.  I, Derek Jack MD, have reviewed the above documentation for accuracy and completeness, and I agree with the above.

## 2020-10-29 NOTE — Therapy (Signed)
[facility] [place], [place], [address] Phone: [phone]   Fax:  [phone]  Physical Therapy Treatment  Patient Details  Name: Travis Wood MRN: [id number] Date of Birth: 31-Aug-1951 Referring Provider (PT): Paralee Cancel MD   Encounter Date: 10/29/2020   PT End of Session - 10/29/20 1344    Visit Number 6    Number of Visits 12    Date for PT Re-Evaluation 11/09/20    Authorization Type FOTO AT LEAST EVERY 5TH VISIT.  PROGRESS NOTE AT 10TH VISIT.  KX MODIFIER AFTER 15 VISITS.    PT Start Time 1346    PT Stop Time 1420   Requested to leave early   PT Time Calculation (min) 34 min    Activity Tolerance Patient tolerated treatment well    Behavior During Therapy [facility] for tasks assessed/performed           Past Medical History:  Diagnosis Date  . Cataract   . CLL (chronic lymphocytic leukemia) ([place])   . Erectile dysfunction   . History of bronchitis   . History of colon polyps   . Hyperlipemia   . Osteoarthritis   . Shortness of breath dyspnea     Past Surgical History:  Procedure Laterality Date  . APPENDECTOMY    . CATARACT EXTRACTION W/PHACO Left 10/15/2015   Procedure: CATARACT EXTRACTION PHACO AND INTRAOCULAR LENS PLACEMENT LEFT EYE;  Surgeon: Tonny Branch, MD;  Location: [facility];  Service: Ophthalmology;  Laterality: Left;  CDE:12.20  . CATARACT EXTRACTION W/PHACO Right 11/12/2015   Procedure: CATARACT EXTRACTION PHACO AND INTRAOCULAR LENS PLACEMENT RIGHT EYE:  CDE:  8.99;  Surgeon: Tonny Branch, MD;  Location: [facility];  Service: Ophthalmology;  Laterality: Right;  . KNEE SURGERY     Left x 2   . MASS EXCISION N/A 09/12/2015   Procedure: EXCISION SOFT TISSUE NEOPLASM (6 CM), BACK;  Surgeon: Aviva Signs, MD;  Location: [facility];  Service: General;  Laterality: N/A;  . 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   Procedure: TOTAL KNEE ARTHROPLASTY;  Surgeon: Paralee Cancel, MD;  Location: [facility];  Service:  Orthopedics;  Laterality: Right;  70 mins    There were no vitals filed for this visit.   Subjective Assessment - 10/29/20 1344    Subjective COVID-19 screen performed prior to patient entering clinic.  Has to leave a little early has he has an appointment at 3:15 pm.    Pertinent History Previous left knee surgeries, OA, CLL, h/o right shoulder pain.    How long can you walk comfortably? Around home with a FWW.    Patient Stated Goals Get back to golfing.    Currently in Pain? Yes    Pain Score 3     Pain Location Knee    Pain Orientation Right    Pain Descriptors / Indicators Discomfort    Pain Type Surgical pain    Pain Onset 1 to 4 weeks ago    Pain Frequency Intermittent              [facility] PT Assessment - 10/29/20 0001      Assessment   Medical Diagnosis Right total knee replacement.    Referring Provider (PT) Paralee Cancel MD    Onset Date/Surgical Date 10/09/20    Hand Dominance Left    Next MD Visit 11/21/2020      Precautions   Precautions Other (comment)    Precaution Comments no ultrasound      Restrictions  Weight Bearing Restrictions No      ROM / Strength   AROM / PROM / Strength AROM      AROM   Overall AROM  Deficits    AROM Assessment Site Knee    Right/Left Knee Right    Right Knee Extension 5    Right Knee Flexion 104                         [facility] Adult PT Treatment/Exercise - 10/29/20 0001      Knee/Hip Exercises: Aerobic   Nustep L3, seat 9 x10 min      Knee/Hip Exercises: Standing   Forward Lunges Right;2 sets;10 reps;3 seconds    Forward Lunges Limitations 8" step    Hip Abduction Stengthening;Right;20 reps;Knee straight    Forward Step Up Right;20 reps;Hand Hold: 2;Step Height: 6"    Rocker Board 3 minutes      Knee/Hip Exercises: Seated   Long Arc Quad Strengthening;Right;2 sets;10 reps;Weights    Long Arc Quad Weight 4 lbs.      Modalities   Modalities Vasopneumatic      Vasopneumatic   Number Minutes  Vasopneumatic  10 minutes    Vasopnuematic Location  Knee    Vasopneumatic Pressure Low    Vasopneumatic Temperature  34                       PT Long Term Goals - 10/22/20 1348      PT LONG TERM GOAL #1   Title Independent with a HEP.    Time 4    Status On-going      PT LONG TERM GOAL #2   Title Full active right knee extension in order to normalize gait.    Baseline AROM - 9 degrees 10/22/20    Time 4    Period Weeks    Status On-going      PT LONG TERM GOAL #3   Title Active right knee flexion to 120 degrees+ so the patient can perform functional tasks and do so with pain not > 2-3/10.    Baseline AROM 85 degrees 10/22/20    Time 4    Period Weeks    Status On-going      PT LONG TERM GOAL #4   Title Increase right hip and  knee strength to a solid 4+/5 to provide good stability for accomplishment of functional activities.    Time 4    Period Weeks    Status On-going      PT LONG TERM GOAL #5   Title Perform a reciprocating stair gait with one railing with pain not > 2-3/10.    Time 4    Period Weeks    Status On-going                 Plan - 10/29/20 1415    Clinical Impression Statement Patient presented in clinic with reports of minimal R knee pain upon arrival. Patient guided through light therex with no complaints of pain. Patient also instructed regarding leading with forward step ups using RLE. AROM of R knee measured as 5-104 deg. Normal vasopneumatic response noted following removal of the modality.    Personal Factors and Comorbidities Comorbidity 1;Comorbidity 2    Comorbidities Previous left knee surgeries, OA, CLL, h/o right shoulder pain.    Examination-Activity Limitations Other;Transfers;Locomotion Level    Examination-Participation Restrictions Other    Stability/Clinical Decision Making Stable/Uncomplicated  Rehab Potential Excellent    PT Frequency 3x / week    PT Duration 4 weeks    PT Treatment/Interventions ADLs/Self  Care Home Management;Cryotherapy;Electrical Stimulation;Gait training;Stair training;Functional mobility training;Therapeutic activities;Therapeutic exercise;Neuromuscular re-education;Manual techniques;Patient/family education;Passive range of motion;Vasopneumatic Device    PT Next Visit Plan cont with POC for Nustep with progression to bike, progress ROM and PRE's    Consulted and Agree with Plan of Care Patient           Patient will benefit from skilled therapeutic intervention in order to improve the following deficits and impairments:  Pain, Decreased range of motion, Decreased strength, Increased edema  Visit Diagnosis: Chronic pain of right knee  Localized edema  Stiffness of right knee, not elsewhere classified  Muscle weakness (generalized)     Problem List Patient Active Problem List   Diagnosis Date Noted  . 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 Right knee OA 10/09/2020  . CLL (chronic lymphocytic leukemia) ([place]) 06/13/2020  . Eustachian tube dysfunction 12/14/2015  . Hyperlipidemia 06/13/2015    Standley Brooking, PTA 10/29/2020, 2:28 PM  [facility] [address] [place], [place], [address] Phone: [phone]   Fax:  [phone]  Name: JAVELLE DONIGAN MRN: [id number] Date of Birth: 27-Nov-1951

## 2020-10-29 NOTE — Patient Instructions (Signed)
[facility] at [facility] Discharge Instructions  You were seen today by Dr. Katragadda. He went over your recent results and scans. Dr. Katragadda will see you back in 4 months for labs and follow up.   Thank you for choosing [facility] at [facility] to provide your oncology and hematology care.  To afford each patient quality time with our provider, please arrive at least 15 minutes before your scheduled appointment time.   If you have a lab appointment with the Cancer Center please come in thru the Main Entrance and check in at the main information desk  You need to re-schedule your appointment should you arrive 10 or more minutes late.  We strive to give you quality time with our providers, and arriving late affects you and other patients whose appointments are after yours.  Also, if you no show three or more times for appointments you may be dismissed from the clinic at the providers discretion.     Again, thank you for choosing [facility].  Our hope is that these requests will decrease the amount of time that you wait before being seen by our physicians.       _____________________________________________________________  Should you have questions after your visit to  Cancer Center, please contact our office at [phone] between the hours of 8:00 a.m. and 4:30 p.m.  Voicemails left after 4:00 p.m. will not be returned until the following business day.  For prescription refill requests, have your pharmacy contact our office and allow 72 hours.    Cancer Center Support Programs:   > Cancer Support Group  2nd Tuesday of the month 1pm-2pm, Journey Room   

## 2020-10-31 ENCOUNTER — Ambulatory Visit: Payer: Medicare HMO | Attending: Orthopedic Surgery | Admitting: Physical Therapy

## 2020-10-31 ENCOUNTER — Other Ambulatory Visit: Payer: Self-pay

## 2020-10-31 DIAGNOSIS — M25561 Pain in right knee: Secondary | ICD-10-CM | POA: Diagnosis not present

## 2020-10-31 DIAGNOSIS — M25661 Stiffness of right knee, not elsewhere classified: Secondary | ICD-10-CM

## 2020-10-31 DIAGNOSIS — G8929 Other chronic pain: Secondary | ICD-10-CM | POA: Diagnosis not present

## 2020-10-31 DIAGNOSIS — R600 Localized edema: Secondary | ICD-10-CM

## 2020-10-31 DIAGNOSIS — M6281 Muscle weakness (generalized): Secondary | ICD-10-CM

## 2020-10-31 NOTE — Therapy (Signed)
[facility] [place], [place], [address] Phone: [phone]   Fax:  [phone]  Physical Therapy Treatment  Patient Details  Name: Travis Wood MRN: [id number] Date of Birth: [birth year] Referring Provider (PT): Paralee Cancel MD   Encounter Date: 10/31/2020   PT End of Session - 10/31/20 4709    Visit Number 7    Number of Visits 12    Date for PT Re-Evaluation 11/09/20    Authorization Type FOTO AT LEAST EVERY 5TH VISIT.  PROGRESS NOTE AT 10TH VISIT.  KX MODIFIER AFTER 15 VISITS.    PT Start Time 0151    PT Stop Time 0229    PT Time Calculation (min) 38 min    Activity Tolerance Patient tolerated treatment well    Behavior During Therapy WFL for tasks assessed/performed           Past Medical History:  Diagnosis Date   Cataract    CLL (chronic lymphocytic leukemia) ([place])    Erectile dysfunction    History of bronchitis    History of colon polyps    Hyperlipemia    Osteoarthritis    Shortness of breath dyspnea     Past Surgical History:  Procedure Laterality Date   APPENDECTOMY     CATARACT EXTRACTION W/PHACO Left 10/15/2015   Procedure: CATARACT EXTRACTION PHACO AND INTRAOCULAR LENS PLACEMENT LEFT EYE;  Surgeon: Tonny Branch, MD;  Location: [facility];  Service: Ophthalmology;  Laterality: Left;  CDE:12.20   CATARACT EXTRACTION W/PHACO Right 11/12/2015   Procedure: CATARACT EXTRACTION PHACO AND INTRAOCULAR LENS PLACEMENT RIGHT EYE:  CDE:  8.99;  Surgeon: Tonny Branch, MD;  Location: [facility];  Service: Ophthalmology;  Laterality: Right;   KNEE SURGERY     Left x 2    MASS EXCISION N/A 09/12/2015   Procedure: EXCISION SOFT TISSUE NEOPLASM (6 CM), BACK;  Surgeon: Aviva Signs, MD;  Location: [facility];  Service: General;  Laterality: N/A;   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   Procedure: TOTAL KNEE ARTHROPLASTY;  Surgeon: Paralee Cancel, MD;  Location: [facility];  Service: Orthopedics;  Laterality: Right;   70 mins    There were no vitals filed for this visit.   Subjective Assessment - 10/31/20 1355    Subjective COVID-19 screen performed prior to patient entering clinic. Patient arrived with ongoing soreness in knee, yet doing well.    Pertinent History Previous left knee surgeries, OA, CLL, h/o right shoulder pain.    How long can you walk comfortably? Around home with a FWW.    Patient Stated Goals Get back to golfing.    Currently in Pain? Yes    Pain Score 3     Pain Location Knee    Pain Orientation Right    Pain Descriptors / Indicators Discomfort    Pain Type Surgical pain    Pain Onset 1 to 4 weeks ago    Pain Frequency Intermittent    Aggravating Factors  bending knee    Pain Relieving Factors rest              [facility] PT Assessment - 10/31/20 0001      AROM   AROM Assessment Site Knee    Right/Left Knee Right    Right Knee Extension -7    Right Knee Flexion 92      PROM   PROM Assessment Site Knee    Right/Left Knee Right    Right Knee Extension -4    Right Knee  Flexion 100                         [facility] Adult PT Treatment/Exercise - 10/31/20 0001      Knee/Hip Exercises: Aerobic   Recumbent Bike x2 1/2 min, limited due to pain    Nustep L3 x 29min      Knee/Hip Exercises: Standing   Forward Step Up Right;20 reps;Hand Hold: 2;Step Height: 6"    Rocker Board 3 minutes      Vasopneumatic   Number Minutes Vasopneumatic  10 minutes    Vasopnuematic Location  Knee    Vasopneumatic Pressure Low    Vasopneumatic Temperature  34      Manual Therapy   Manual Therapy Passive ROM    Passive ROM gentle R knee PROM into flex/ext to improve mobility                       PT Long Term Goals - 10/31/20 1418      PT LONG TERM GOAL #1   Title Independent with a HEP.    Time 4    Period Weeks    Status On-going      PT LONG TERM GOAL #2   Title Full active right knee extension in order to normalize gait.    Baseline AROM - 7  degrees 10/31/20    Time 4    Period Weeks    Status On-going      PT LONG TERM GOAL #3   Title Active right knee flexion to 120 degrees+ so the patient can perform functional tasks and do so with pain not > 2-3/10.    Baseline AROM 92 degrees 10/31/20    Time 4    Period Weeks    Status On-going      PT LONG TERM GOAL #4   Title Increase right hip and  knee strength to a solid 4+/5 to provide good stability for accomplishment of functional activities.    Time 4    Period Weeks    Status On-going                 Plan - 10/31/20 1419    Clinical Impression Statement Patient tolerated treatment well today. Patient attempted bike transition yet was too much today per reported. Today patient improved with AROM in right knee for flexion and ext. Patient has improved quad control and able to perform standing activities with greater ease. goals progressing today.    Personal Factors and Comorbidities Comorbidity 1;Comorbidity 2    Comorbidities Previous left knee surgeries, OA, CLL, h/o right shoulder pain.    Examination-Activity Limitations Other;Transfers;Locomotion Level    Examination-Participation Restrictions Other    Stability/Clinical Decision Making Stable/Uncomplicated    Rehab Potential Excellent    PT Frequency 3x / week    PT Duration 4 weeks    PT Treatment/Interventions ADLs/Self Care Home Management;Cryotherapy;Electrical Stimulation;Gait training;Stair training;Functional mobility training;Therapeutic activities;Therapeutic exercise;Neuromuscular re-education;Manual techniques;Patient/family education;Passive range of motion;Vasopneumatic Device    PT Next Visit Plan cont with POC for Nustep with progression to bike, progress ROM and PRE's    Consulted and Agree with Plan of Care Patient           Patient will benefit from skilled therapeutic intervention in order to improve the following deficits and impairments:  Pain, Decreased range of motion, Decreased  strength, Increased edema  Visit Diagnosis: Chronic pain of right knee  Localized edema  Stiffness of right knee, not elsewhere classified  Muscle weakness (generalized)     Problem List Patient Active Problem List   Diagnosis Date Noted   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 [place], [place], [address] Phone: [phone]   Fax:  [phone]  Name: Travis Wood MRN: [id number] Date of Birth: 07-15-1951

## 2020-11-02 ENCOUNTER — Ambulatory Visit: Payer: Medicare HMO | Admitting: Physical Therapy

## 2020-11-02 ENCOUNTER — Other Ambulatory Visit: Payer: Self-pay

## 2020-11-02 ENCOUNTER — Encounter: Payer: Self-pay | Admitting: Physical Therapy

## 2020-11-02 DIAGNOSIS — M6281 Muscle weakness (generalized): Secondary | ICD-10-CM | POA: Diagnosis not present

## 2020-11-02 DIAGNOSIS — M25661 Stiffness of right knee, not elsewhere classified: Secondary | ICD-10-CM | POA: Diagnosis not present

## 2020-11-02 DIAGNOSIS — R600 Localized edema: Secondary | ICD-10-CM | POA: Diagnosis not present

## 2020-11-02 DIAGNOSIS — G8929 Other chronic pain: Secondary | ICD-10-CM | POA: Diagnosis not present

## 2020-11-02 DIAGNOSIS — M25561 Pain in right knee: Secondary | ICD-10-CM | POA: Diagnosis not present

## 2020-11-02 NOTE — Therapy (Signed)
[facility] [place], [place], [address] Phone: [phone]   Fax:  [phone]  Physical Therapy Treatment  Patient Details  Name: Travis Wood MRN: [id number] Date of Birth: [birth year] Referring Provider (PT): Paralee Cancel MD   Encounter Date: 11/02/2020   PT End of Session - 11/02/20 1216    Visit Number 8    Number of Visits 12    Date for PT Re-Evaluation 11/09/20    Authorization Type FOTO AT LEAST EVERY 5TH VISIT.  PROGRESS NOTE AT 10TH VISIT.  KX MODIFIER AFTER 15 VISITS.    PT Start Time 1105    PT Stop Time 1153    PT Time Calculation (min) 48 min    Activity Tolerance Patient tolerated treatment well    Behavior During Therapy WFL for tasks assessed/performed           Past Medical History:  Diagnosis Date  . Cataract   . CLL (chronic lymphocytic leukemia) ([place])   . Erectile dysfunction   . History of bronchitis   . History of colon polyps   . Hyperlipemia   . Osteoarthritis   . Shortness of breath dyspnea     Past Surgical History:  Procedure Laterality Date  . APPENDECTOMY    . CATARACT EXTRACTION W/PHACO Left 10/15/2015   Procedure: CATARACT EXTRACTION PHACO AND INTRAOCULAR LENS PLACEMENT LEFT EYE;  Surgeon: Tonny Branch, MD;  Location: [facility];  Service: Ophthalmology;  Laterality: Left;  CDE:12.20  . CATARACT EXTRACTION W/PHACO Right 11/12/2015   Procedure: CATARACT EXTRACTION PHACO AND INTRAOCULAR LENS PLACEMENT RIGHT EYE:  CDE:  8.99;  Surgeon: Tonny Branch, MD;  Location: [facility];  Service: Ophthalmology;  Laterality: Right;  . KNEE SURGERY     Left x 2   . MASS EXCISION N/A 09/12/2015   Procedure: EXCISION SOFT TISSUE NEOPLASM (6 CM), BACK;  Surgeon: Aviva Signs, MD;  Location: [facility];  Service: General;  Laterality: N/A;  . 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   Procedure: TOTAL KNEE ARTHROPLASTY;  Surgeon: Paralee Cancel, MD;  [facility]: [facility];  Service: Orthopedics;  Laterality: Right;   70 mins    There were no vitals filed for this visit.   Subjective Assessment - 11/02/20 1114    Subjective COVID-19 screen performed prior to patient entering clinic. No new complaints.    Pertinent History Previous left knee surgeries, OA, CLL, h/o right shoulder pain.    How long can you walk comfortably? Around home with a FWW.    Patient Stated Goals Get back to golfing.    Currently in Pain? No/denies              [facility] PT Assessment - 11/02/20 0001      Assessment   Medical Diagnosis Right total knee replacement.    Referring Provider (PT) Paralee Cancel MD    Onset Date/Surgical Date 10/09/20    Hand Dominance Left    Next MD Visit 11/21/2020      Precautions   Precautions Other (comment)    Precaution Comments no ultrasound      Restrictions   Weight Bearing Restrictions No      Circumferential Edema   Circumferential - Right 48.5 cm    Circumferential - Left  45.5 cm      ROM / Strength   AROM / PROM / Strength AROM      AROM   Overall AROM  Deficits    AROM Assessment Site Knee  Right/Left Knee Right    Right Knee Extension 10    Right Knee Flexion 115                         [facility] Adult PT Treatment/Exercise - 11/02/20 0001      Knee/Hip Exercises: Aerobic   Nustep L4, seat 11 x15 min      Knee/Hip Exercises: Machines for Strengthening   Cybex Knee Extension 10# 2x10 reps    Cybex Knee Flexion 30# 2x10 reps      Knee/Hip Exercises: Standing   Forward Lunges Right;2 sets;10 reps;3 seconds    Forward Lunges Limitations 6" step    Lateral Step Up Right;20 reps;Hand Hold: 2;Step Height: 6"    Forward Step Up Right;20 reps;Hand Hold: 2;Step Height: 6"    Step Down Right;2 sets;10 reps;Hand Hold: 2;Step Height: 6"    Rocker Board 3 minutes      Knee/Hip Exercises: Supine   Single Leg Bridge Strengthening;Right;15 reps    Straight Leg Raises Strengthening;Right;2 sets;10 reps      Modalities   Modalities Vasopneumatic       Vasopneumatic   Number Minutes Vasopneumatic  15 minutes    Vasopnuematic Location  Knee    Vasopneumatic Pressure Medium    Vasopneumatic Temperature  34/edema                       PT Long Term Goals - 10/31/20 1418      PT LONG TERM GOAL #1   Title Independent with a HEP.    Time 4    Period Weeks    Status On-going      PT LONG TERM GOAL #2   Title Full active right knee extension in order to normalize gait.    Baseline AROM - 7 degrees 10/31/20    Time 4    Period Weeks    Status On-going      PT LONG TERM GOAL #3   Title Active right knee flexion to 120 degrees+ so the patient can perform functional tasks and do so with pain not > 2-3/10.    Baseline AROM 92 degrees 10/31/20    Time 4    Period Weeks    Status On-going      PT LONG TERM GOAL #4   Title Increase right hip and  knee strength to a solid 4+/5 to provide good stability for accomplishment of functional activities.    Time 4    Period Weeks    Status On-going                 Plan - 11/02/20 1216    Clinical Impression Statement Patient presented in clinic with reports of no soreness or new limitations. Patient able to complete all therex well even the introduction to machine strengthening. Patient lacking more full range R knee extension at this time. 3 cm difference in edema R knee > L knee. AROM of R knee measured as 10-115 deg. Normal vasopneumatic response noted following removal of the modality.    Personal Factors and Comorbidities Comorbidity 1;Comorbidity 2    Comorbidities Previous left knee surgeries, OA, CLL, h/o right shoulder pain.    Examination-Activity Limitations Other;Transfers;Locomotion Level    Examination-Participation Restrictions Other    Stability/Clinical Decision Making Stable/Uncomplicated    Rehab Potential Excellent    PT Frequency 3x / week    PT Duration 4 weeks    PT Treatment/Interventions  ADLs/Self Care Home Management;Cryotherapy;Electrical  Stimulation;Gait training;Stair training;Functional mobility training;Therapeutic activities;Therapeutic exercise;Neuromuscular re-education;Manual techniques;Patient/family education;Passive range of motion;Vasopneumatic Device    PT Next Visit Plan cont with POC for Nustep with progression to bike, progress ROM and PRE's    Consulted and Agree with Plan of Care Patient           Patient will benefit from skilled therapeutic intervention in order to improve the following deficits and impairments:  Pain, Decreased range of motion, Decreased strength, Increased edema  Visit Diagnosis: Chronic pain of right knee  Localized edema  Stiffness of right knee, not elsewhere classified  Muscle weakness (generalized)     Problem List Patient Active Problem List   Diagnosis Date Noted  . 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 Right knee OA 10/09/2020  . CLL (chronic lymphocytic leukemia) ([place]) 06/13/2020  . Eustachian tube dysfunction 12/14/2015  . Hyperlipidemia 06/13/2015    Standley Brooking, PTA 11/02/2020, 12:27 PM  [facility] [address] [place], [place], [address] Phone: [phone]   Fax:  [phone]  Name: Travis Wood MRN: [id number] Date of Birth: Feb 25, 1951

## 2020-11-05 ENCOUNTER — Other Ambulatory Visit: Payer: Self-pay

## 2020-11-05 ENCOUNTER — Encounter: Payer: Self-pay | Admitting: Physical Therapy

## 2020-11-05 ENCOUNTER — Ambulatory Visit: Payer: Medicare HMO | Admitting: Physical Therapy

## 2020-11-05 DIAGNOSIS — G8929 Other chronic pain: Secondary | ICD-10-CM | POA: Diagnosis not present

## 2020-11-05 DIAGNOSIS — M6281 Muscle weakness (generalized): Secondary | ICD-10-CM | POA: Diagnosis not present

## 2020-11-05 DIAGNOSIS — M25661 Stiffness of right knee, not elsewhere classified: Secondary | ICD-10-CM | POA: Diagnosis not present

## 2020-11-05 DIAGNOSIS — M25561 Pain in right knee: Secondary | ICD-10-CM

## 2020-11-05 DIAGNOSIS — R600 Localized edema: Secondary | ICD-10-CM | POA: Diagnosis not present

## 2020-11-05 NOTE — Therapy (Signed)
[facility] [place], [place], [address] Phone: [phone]   Fax:  [phone]  Physical Therapy Treatment  Patient Details  Name: Travis Wood MRN: [id number] Date of Birth: [birth year] Referring Provider (PT): Paralee Cancel MD   Encounter Date: 11/05/2020   PT End of Session - 11/05/20 1522    Visit Number 9    Number of Visits 12    Date for PT Re-Evaluation 11/09/20    Authorization Type FOTO AT LEAST EVERY 5TH VISIT.  PROGRESS NOTE AT 10TH VISIT.  KX MODIFIER AFTER 15 VISITS.    PT Start Time 1515    PT Stop Time 1604    PT Time Calculation (min) 49 min    Activity Tolerance Patient tolerated treatment well    Behavior During Therapy WFL for tasks assessed/performed           Past Medical History:  Diagnosis Date  . Cataract   . CLL (chronic lymphocytic leukemia) ([place])   . Erectile dysfunction   . History of bronchitis   . History of colon polyps   . Hyperlipemia   . Osteoarthritis   . Shortness of breath dyspnea     Past Surgical History:  Procedure Laterality Date  . APPENDECTOMY    . CATARACT EXTRACTION W/PHACO Left 10/15/2015   Procedure: CATARACT EXTRACTION PHACO AND INTRAOCULAR LENS PLACEMENT LEFT EYE;  Surgeon: Tonny Branch, MD;  Location: [facility];  Service: Ophthalmology;  Laterality: Left;  CDE:12.20  . CATARACT EXTRACTION W/PHACO Right 11/12/2015   Procedure: CATARACT EXTRACTION PHACO AND INTRAOCULAR LENS PLACEMENT RIGHT EYE:  CDE:  8.99;  Surgeon: Tonny Branch, MD;  Location: [facility];  Service: Ophthalmology;  Laterality: Right;  . KNEE SURGERY     Left x 2   . MASS EXCISION N/A 09/12/2015   Procedure: EXCISION SOFT TISSUE NEOPLASM (6 CM), BACK;  Surgeon: Aviva Signs, MD;  Location: [facility];  Service: General;  Laterality: N/A;  . 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   Procedure: TOTAL KNEE ARTHROPLASTY;  Surgeon: Paralee Cancel, MD;  Location: [facility];  Service: Orthopedics;  Laterality: Right;   70 mins    There were no vitals filed for this visit.   Subjective Assessment - 11/05/20 1521    Subjective COVID-19 screen performed prior to patient entering clinic. No new complaints.    Pertinent History Previous left knee surgeries, OA, CLL, h/o right shoulder pain.    How long can you walk comfortably? Around home with a FWW.    Patient Stated Goals Get back to golfing.    Currently in Pain? Yes    Pain Score 1     Pain Location Knee    Pain Orientation Right    Pain Descriptors / Indicators Discomfort    Pain Type Surgical pain    Pain Onset 1 to 4 weeks ago    Pain Frequency Intermittent              [facility] PT Assessment - 11/05/20 0001      Assessment   Medical Diagnosis Right total knee replacement.    Referring Provider (PT) Paralee Cancel MD    Onset Date/Surgical Date 10/09/20    Hand Dominance Left    Next MD Visit 11/21/2020      Precautions   Precautions Other (comment)    Precaution Comments no ultrasound      Restrictions   Weight Bearing Restrictions No  [facility] PT Treatment/Exercise - 11/05/20 0001      Knee/Hip Exercises: Aerobic   Recumbent Bike 5 mins at seat 10; AAROM to full revolutions    Nustep L4, seat 9 x10 min      Knee/Hip Exercises: Machines for Strengthening   Cybex Knee Extension 10# 2x10 reps    Cybex Knee Flexion 30# 2x10 reps      Knee/Hip Exercises: Standing   Lateral Step Up Right;20 reps;Step Height: 6";Hand Hold: 0    Forward Step Up Right;20 reps;Hand Hold: 2;Step Height: 6"    Step Down Right;2 sets;10 reps;Hand Hold: 2;Step Height: 6"    Rocker Board 3 minutes      Modalities   Modalities Vasopneumatic      Vasopneumatic   Number Minutes Vasopneumatic  10 minutes    Vasopnuematic Location  Knee    Vasopneumatic Pressure Medium    Vasopneumatic Temperature  34 for edema                       PT Long Term Goals - 10/31/20 1418      PT LONG TERM GOAL #1    Title Independent with a HEP.    Time 4    Period Weeks    Status On-going      PT LONG TERM GOAL #2   Title Full active right knee extension in order to normalize gait.    Baseline AROM - 7 degrees 10/31/20    Time 4    Period Weeks    Status On-going      PT LONG TERM GOAL #3   Title Active right knee flexion to 120 degrees+ so the patient can perform functional tasks and do so with pain not > 2-3/10.    Baseline AROM 92 degrees 10/31/20    Time 4    Period Weeks    Status On-going      PT LONG TERM GOAL #4   Title Increase right hip and  knee strength to a solid 4+/5 to provide good stability for accomplishment of functional activities.    Time 4    Period Weeks    Status On-going                 Plan - 11/05/20 1611    Clinical Impression Statement Patient responded well to therapy session but with slight increase of pain in R knee. Recumbent bike initiated with good AAROM to AROM to full revolutions at the end of 5 mins. Step ups performed and educated on terminal knee extension with step ups and lateral step ups for full knee extension. No adverse affects upon removal of modalities.    Personal Factors and Comorbidities Comorbidity 1;Comorbidity 2    Comorbidities Previous left knee surgeries, OA, CLL, h/o right shoulder pain.    Examination-Activity Limitations Other;Transfers;Locomotion Level    Examination-Participation Restrictions Other    Stability/Clinical Decision Making Stable/Uncomplicated    Clinical Decision Making Low    Rehab Potential Excellent    PT Frequency 3x / week    PT Duration 4 weeks    PT Treatment/Interventions ADLs/Self Care Home Management;Cryotherapy;Electrical Stimulation;Gait training;Stair training;Functional mobility training;Therapeutic activities;Therapeutic exercise;Neuromuscular re-education;Manual techniques;Patient/family education;Passive range of motion;Vasopneumatic Device    PT Next Visit Plan Progress note and FOTO for 10th  visit; nustep for 10 mins then transition to bike; cont with POC for Nustep with progression to bike, progress ROM and PRE's    Consulted and Agree with Plan of  Care Patient           Patient will benefit from skilled therapeutic intervention in order to improve the following deficits and impairments:  Pain, Decreased range of motion, Decreased strength, Increased edema  Visit Diagnosis: Chronic pain of right knee  Localized edema  Stiffness of right knee, not elsewhere classified  Muscle weakness (generalized)     Problem List Patient Active Problem List   Diagnosis Date Noted  . 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 Right knee OA 10/09/2020  . CLL (chronic lymphocytic leukemia) ([place]) 06/13/2020  . Eustachian tube dysfunction 12/14/2015  . Hyperlipidemia 06/13/2015    Gabriela Eves, PT, DPT 11/05/2020, 4:14 PM  [facility] [address] [place], [place], [address] Phone: [phone]   Fax:  [phone]  Name: Travis Wood MRN: [id number] Date of Birth: Aug 01, 1951

## 2020-11-07 ENCOUNTER — Ambulatory Visit: Payer: Medicare HMO | Admitting: Physical Therapy

## 2020-11-07 ENCOUNTER — Other Ambulatory Visit: Payer: Self-pay

## 2020-11-07 ENCOUNTER — Encounter: Payer: Self-pay | Admitting: Physical Therapy

## 2020-11-07 DIAGNOSIS — M25561 Pain in right knee: Secondary | ICD-10-CM | POA: Diagnosis not present

## 2020-11-07 DIAGNOSIS — R600 Localized edema: Secondary | ICD-10-CM | POA: Diagnosis not present

## 2020-11-07 DIAGNOSIS — M25661 Stiffness of right knee, not elsewhere classified: Secondary | ICD-10-CM | POA: Diagnosis not present

## 2020-11-07 DIAGNOSIS — G8929 Other chronic pain: Secondary | ICD-10-CM

## 2020-11-07 DIAGNOSIS — M6281 Muscle weakness (generalized): Secondary | ICD-10-CM | POA: Diagnosis not present

## 2020-11-07 NOTE — Therapy (Signed)
[facility] [place], [place], [address] Phone: [phone]   Fax:  [phone]  Physical Therapy Treatment  Progress Note Reporting Period 10/12/2020 to 11/07/2020  See note below for Objective Data and Assessment of Progress/Goals. Patient making gains toward goals but no goals achieved at this time. Gabriela Eves, PT, DPT     Patient Details  Name: Travis Wood MRN: [id number] Date of Birth: Dec 13, 1950 Referring Provider (PT): Paralee Cancel MD   Encounter Date: 11/07/2020   PT End of Session - 11/07/20 1352    Visit Number 10    Number of Visits 12    Date for PT Re-Evaluation 11/09/20    Authorization Type FOTO AT LEAST EVERY 5TH VISIT.  PROGRESS NOTE AT 10TH VISIT.  KX MODIFIER AFTER 15 VISITS.    PT Start Time 1345    PT Stop Time 1421    PT Time Calculation (min) 36 min    Activity Tolerance Patient tolerated treatment well    Behavior During Therapy WFL for tasks assessed/performed           Past Medical History:  Diagnosis Date  . Cataract   . CLL (chronic lymphocytic leukemia) ([place])   . Erectile dysfunction   . History of bronchitis   . History of colon polyps   . Hyperlipemia   . Osteoarthritis   . Shortness of breath dyspnea     Past Surgical History:  Procedure Laterality Date  . APPENDECTOMY    . CATARACT EXTRACTION W/PHACO Left 10/15/2015   Procedure: CATARACT EXTRACTION PHACO AND INTRAOCULAR LENS PLACEMENT LEFT EYE;  Surgeon: Tonny Branch, MD;  Location: [facility];  Service: Ophthalmology;  Laterality: Left;  CDE:12.20  . CATARACT EXTRACTION W/PHACO Right 11/12/2015   Procedure: CATARACT EXTRACTION PHACO AND INTRAOCULAR LENS PLACEMENT RIGHT EYE:  CDE:  8.99;  Surgeon: Tonny Branch, MD;  Location: [facility];  Service: Ophthalmology;  Laterality: Right;  . KNEE SURGERY     Left x 2   . MASS EXCISION N/A 09/12/2015   Procedure: EXCISION SOFT TISSUE NEOPLASM (6 CM), BACK;  Surgeon: Aviva Signs,  MD;  Location: [facility];  Service: General;  Laterality: N/A;  . 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   Procedure: TOTAL KNEE ARTHROPLASTY;  Surgeon: Paralee Cancel, MD;  Location: [facility];  Service: Orthopedics;  Laterality: Right;  70 mins    There were no vitals filed for this visit.   Subjective Assessment - 11/07/20 1351    Subjective COVID-19 screen performed prior to patient entering clinic. A little pain along medial knee. Limiting sleep some.    Pertinent History Previous left knee surgeries, OA, CLL, h/o right shoulder pain.    How long can you walk comfortably? Around home with a FWW.    Patient Stated Goals Get back to golfing.    Currently in Pain? Yes    Pain Score 2     Pain Location Knee    Pain Orientation Right;Medial    Pain Descriptors / Indicators Discomfort    Pain Type Surgical pain    Pain Onset 1 to 4 weeks ago    Pain Frequency Intermittent              [facility] PT Assessment - 11/07/20 0001      Assessment   Medical Diagnosis Right total knee replacement.    Referring Provider (PT) Paralee Cancel MD    Onset Date/Surgical Date 10/09/20    Hand Dominance Left    Next  MD Visit 11/21/2020      Precautions   Precautions Other (comment)    Precaution Comments no ultrasound      Restrictions   Weight Bearing Restrictions No                         [facility] Adult PT Treatment/Exercise - 11/07/20 0001      Knee/Hip Exercises: Aerobic   Nustep L4, seat 10 x15 min      Knee/Hip Exercises: Machines for Strengthening   Cybex Knee Extension 10# 2x10 reps    Cybex Knee Flexion 30# 2x10 reps      Knee/Hip Exercises: Standing   Forward Lunges Right;2 sets;10 reps;3 seconds    Forward Lunges Limitations off 8" step    Terminal Knee Extension Strengthening;Right;20 reps;Limitations    Terminal Knee Extension Limitations [place] XTS    Rocker Board 3 minutes      Modalities   Modalities Vasopneumatic      Vasopneumatic   Number Minutes  Vasopneumatic  10 minutes    Vasopnuematic Location  Knee    Vasopneumatic Pressure Medium    Vasopneumatic Temperature  34 for edema                       PT Long Term Goals - 10/31/20 1418      PT LONG TERM GOAL #1   Title Independent with a HEP.    Time 4    Period Weeks    Status On-going      PT LONG TERM GOAL #2   Title Full active right knee extension in order to normalize gait.    Baseline AROM - 7 degrees 10/31/20    Time 4    Period Weeks    Status On-going      PT LONG TERM GOAL #3   Title Active right knee flexion to 120 degrees+ so the patient can perform functional tasks and do so with pain not > 2-3/10.    Baseline AROM 92 degrees 10/31/20    Time 4    Period Weeks    Status On-going      PT LONG TERM GOAL #4   Title Increase right hip and  knee strength to a solid 4+/5 to provide good stability for accomplishment of functional activities.    Time 4    Period Weeks    Status On-going                 Plan - 11/07/20 1427    Clinical Impression Statement Patient presented in clinic with only minimal discomfort of medial R knee. Patient not using any AD during ambulation at this time. Patient progressed to more strengthening activites such as TKE in which he could feel muscle fatigue. No other functional complaints other than minimal sleep limitation. Normal vasopneumatic response noted following removal of the modality.    Personal Factors and Comorbidities Comorbidity 1;Comorbidity 2    Comorbidities Previous left knee surgeries, OA, CLL, h/o right shoulder pain.    Examination-Activity Limitations Other;Transfers;Locomotion Level    Examination-Participation Restrictions Other    Stability/Clinical Decision Making Stable/Uncomplicated    Rehab Potential Excellent    PT Frequency 3x / week    PT Duration 4 weeks    PT Treatment/Interventions ADLs/Self Care Home Management;Cryotherapy;Electrical Stimulation;Gait training;Stair  training;Functional mobility training;Therapeutic activities;Therapeutic exercise;Neuromuscular re-education;Manual techniques;Patient/family education;Passive range of motion;Vasopneumatic Device    PT Next Visit Plan Progress to stationary bike. FOTO  complete on 11th visit.    Consulted and Agree with Plan of Care Patient           Patient will benefit from skilled therapeutic intervention in order to improve the following deficits and impairments:  Pain, Decreased range of motion, Decreased strength, Increased edema  Visit Diagnosis: Chronic pain of right knee  Localized edema  Stiffness of right knee, not elsewhere classified  Muscle weakness (generalized)     Problem List Patient Active Problem List   Diagnosis Date Noted  . 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 Right knee OA 10/09/2020  . CLL (chronic lymphocytic leukemia) ([place]) 06/13/2020  . Eustachian tube dysfunction 12/14/2015  . Hyperlipidemia 06/13/2015    Standley Brooking, PTA 11/07/2020, 2:37 PM  [facility] [address] [place], [place], [address] Phone: [phone]   Fax:  [phone]  Name: DIMITRIOUS MICCICHE MRN: [id number] Date of Birth: 1951/05/14

## 2020-11-09 ENCOUNTER — Other Ambulatory Visit: Payer: Self-pay

## 2020-11-09 ENCOUNTER — Ambulatory Visit: Payer: Medicare HMO | Admitting: Physical Therapy

## 2020-11-09 ENCOUNTER — Encounter: Payer: Self-pay | Admitting: Physical Therapy

## 2020-11-09 DIAGNOSIS — G8929 Other chronic pain: Secondary | ICD-10-CM

## 2020-11-09 DIAGNOSIS — M25661 Stiffness of right knee, not elsewhere classified: Secondary | ICD-10-CM | POA: Diagnosis not present

## 2020-11-09 DIAGNOSIS — R600 Localized edema: Secondary | ICD-10-CM | POA: Diagnosis not present

## 2020-11-09 DIAGNOSIS — M25561 Pain in right knee: Secondary | ICD-10-CM | POA: Diagnosis not present

## 2020-11-09 DIAGNOSIS — M6281 Muscle weakness (generalized): Secondary | ICD-10-CM

## 2020-11-09 NOTE — Therapy (Signed)
[facility] [place], [place], [address] Phone: [phone]   Fax:  [phone]  Physical Therapy Treatment  Patient Details  Name: Travis Wood MRN: [id number] Date of Birth: [birth year] Referring Provider (PT): Paralee Cancel MD   Encounter Date: 11/09/2020   PT End of Session - 11/09/20 0949    Visit Number 11    Number of Visits 12    Date for PT Re-Evaluation 11/09/20    Authorization Type FOTO AT LEAST EVERY 5TH VISIT.  PROGRESS NOTE AT 10TH VISIT.  KX MODIFIER AFTER 15 VISITS.    PT Start Time [phone]    PT Stop Time 1028    PT Time Calculation (min) 41 min    Activity Tolerance Patient tolerated treatment well    Behavior During Therapy WFL for tasks assessed/performed           Past Medical History:  Diagnosis Date  . Cataract   . CLL (chronic lymphocytic leukemia) ([place])   . Erectile dysfunction   . History of bronchitis   . History of colon polyps   . Hyperlipemia   . Osteoarthritis   . Shortness of breath dyspnea     Past Surgical History:  Procedure Laterality Date  . APPENDECTOMY    . CATARACT EXTRACTION W/PHACO Left 10/15/2015   Procedure: CATARACT EXTRACTION PHACO AND INTRAOCULAR LENS PLACEMENT LEFT EYE;  Surgeon: Tonny Branch, MD;  Location: [facility];  Service: Ophthalmology;  Laterality: Left;  CDE:12.20  . CATARACT EXTRACTION W/PHACO Right 11/12/2015   Procedure: CATARACT EXTRACTION PHACO AND INTRAOCULAR LENS PLACEMENT RIGHT EYE:  CDE:  8.99;  Surgeon: Tonny Branch, MD;  Location: [facility];  Service: Ophthalmology;  Laterality: Right;  . KNEE SURGERY     Left x 2   . MASS EXCISION N/A 09/12/2015   Procedure: EXCISION SOFT TISSUE NEOPLASM (6 CM), BACK;  Surgeon: Aviva Signs, MD;  Location: [facility];  Service: General;  Laterality: N/A;  . 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   Procedure: TOTAL KNEE ARTHROPLASTY;  Surgeon: Paralee Cancel, MD;  Location: [facility];  Service: Orthopedics;  Laterality: Right;   70 mins    There were no vitals filed for this visit.   Subjective Assessment - 11/09/20 0947    Subjective COVID-19 screen performed prior to patient entering clinic. Reports some discomfort during the night but able to turn over and then go back to sleep.    Pertinent History Previous left knee surgeries, OA, CLL, h/o right shoulder pain.    Currently in Pain? No/denies              [facility] PT Assessment - 11/09/20 0001      Assessment   Medical Diagnosis Right total knee replacement.    Referring Provider (PT) Paralee Cancel MD    Onset Date/Surgical Date 10/09/20    Hand Dominance Left    Next MD Visit 11/21/2020      Precautions   Precautions Other (comment)    Precaution Comments no ultrasound      Restrictions   Weight Bearing Restrictions No      Circumferential Edema   Circumferential - Right 47.7 cm    Circumferential - Left  45.2 cm      ROM / Strength   AROM / PROM / Strength AROM      AROM   Overall AROM  Deficits    AROM Assessment Site Knee    Right/Left Knee Right    Right Knee Extension 8  Right Knee Flexion 113                         [facility] Adult PT Treatment/Exercise - 11/09/20 0001      Knee/Hip Exercises: Aerobic   Recumbent Bike seat 10 x12 min      Knee/Hip Exercises: Machines for Strengthening   Cybex Knee Extension 10# 2x10 reps    Cybex Knee Flexion 40# 2x10 reps    Cybex Leg Press 1.5 pl, seat 7 x20 reps      Knee/Hip Exercises: Standing   Lateral Step Up Right;2 sets;10 reps;Hand Hold: 2;Step Height: 4"    Step Down Right;2 sets;10 reps;Hand Hold: 2;Step Height: 4"      Knee/Hip Exercises: Seated   Sit to Sand 10 reps;without UE support      Modalities   Modalities Vasopneumatic      Vasopneumatic   Number Minutes Vasopneumatic  10 minutes    Vasopnuematic Location  Knee    Vasopneumatic Pressure Medium    Vasopneumatic Temperature  34 for edema                       PT Long Term Goals -  10/31/20 1418      PT LONG TERM GOAL #1   Title Independent with a HEP.    Time 4    Period Weeks    Status On-going      PT LONG TERM GOAL #2   Title Full active right knee extension in order to normalize gait.    Baseline AROM - 7 degrees 10/31/20    Time 4    Period Weeks    Status On-going      PT LONG TERM GOAL #3   Title Active right knee flexion to 120 degrees+ so the patient can perform functional tasks and do so with pain not > 2-3/10.    Baseline AROM 92 degrees 10/31/20    Time 4    Period Weeks    Status On-going      PT LONG TERM GOAL #4   Title Increase right hip and  knee strength to a solid 4+/5 to provide good stability for accomplishment of functional activities.    Time 4    Period Weeks    Status On-going                 Plan - 11/09/20 1023    Clinical Impression Statement Patient progressed through more advanced functional strengthening today to prepare for stairs as well as stationary bike. Patient able to feel quad fatigue with step downs and heel dot for TKE. Patient presented with 2.2 cm greater R knee edema than L knee. Patient reports compliance with icing R knee multiple times per day. AROM of R knee measured as 8-113 deg. Small areas of scabbing notable in R knee incision from mid incision to inferior aspect of incision. Normal vasopneumatic response noted following removal of the modality.    Personal Factors and Comorbidities Comorbidity 1;Comorbidity 2    Comorbidities Previous left knee surgeries, OA, CLL, h/o right shoulder pain.    Examination-Activity Limitations Other;Transfers;Locomotion Level    Examination-Participation Restrictions Other    Stability/Clinical Decision Making Stable/Uncomplicated    Rehab Potential Excellent    PT Frequency 3x / week    PT Duration 4 weeks    PT Treatment/Interventions ADLs/Self Care Home Management;Cryotherapy;Electrical Stimulation;Gait training;Stair training;Functional mobility  training;Therapeutic activities;Therapeutic exercise;Neuromuscular re-education;Manual techniques;Patient/family education;Passive range  of motion;Vasopneumatic Device    PT Next Visit Plan Progress to stationary bike. FOTO complete on 11th visit.    Consulted and Agree with Plan of Care Patient           Patient will benefit from skilled therapeutic intervention in order to improve the following deficits and impairments:  Pain,Decreased range of motion,Decreased strength,Increased edema  Visit Diagnosis: Chronic pain of right knee  Localized edema  Stiffness of right knee, not elsewhere classified  Muscle weakness (generalized)     Problem List Patient Active Problem List   Diagnosis Date Noted  . 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 Right knee OA 10/09/2020  . CLL (chronic lymphocytic leukemia) ([place]) 06/13/2020  . Eustachian tube dysfunction 12/14/2015  . Hyperlipidemia 06/13/2015    Standley Brooking, PTA 11/09/2020, 10:30 AM  [facility] [address] [place], [place], [address] Phone: [phone]   Fax:  [phone]  Name: PERCIVAL GLASHEEN MRN: [id number] Date of Birth: March 18, 1951

## 2020-11-13 ENCOUNTER — Ambulatory Visit: Payer: Medicare HMO | Admitting: Physical Therapy

## 2020-11-15 ENCOUNTER — Other Ambulatory Visit: Payer: Self-pay

## 2020-11-15 ENCOUNTER — Ambulatory Visit: Payer: Medicare HMO | Admitting: *Deleted

## 2020-11-15 DIAGNOSIS — M25561 Pain in right knee: Secondary | ICD-10-CM | POA: Diagnosis not present

## 2020-11-15 DIAGNOSIS — G8929 Other chronic pain: Secondary | ICD-10-CM | POA: Diagnosis not present

## 2020-11-15 DIAGNOSIS — M6281 Muscle weakness (generalized): Secondary | ICD-10-CM | POA: Diagnosis not present

## 2020-11-15 DIAGNOSIS — R600 Localized edema: Secondary | ICD-10-CM | POA: Diagnosis not present

## 2020-11-15 DIAGNOSIS — M25661 Stiffness of right knee, not elsewhere classified: Secondary | ICD-10-CM | POA: Diagnosis not present

## 2020-11-15 NOTE — Therapy (Signed)
[facility] [place], [place], [address] Phone: [phone]   Fax:  [phone]  Physical Therapy Treatment  Patient Details  Name: Travis Wood MRN: [id number] Date of Birth: 27-Nov-1951 Referring Provider (PT): Paralee Cancel MD   Encounter Date: 11/15/2020   PT End of Session - 11/15/20 1359    Visit Number 12    Number of Visits 18    Date for PT Re-Evaluation 12/07/20    Authorization Type FOTO AT LEAST EVERY 5TH VISIT.  PROGRESS NOTE AT 10TH VISIT.  KX MODIFIER AFTER 15 VISITS.    PT Start Time 1345    PT Stop Time 1436    PT Time Calculation (min) 51 min           Past Medical History:  Diagnosis Date  . Cataract   . CLL (chronic lymphocytic leukemia) ([place])   . Erectile dysfunction   . History of bronchitis   . History of colon polyps   . Hyperlipemia   . Osteoarthritis   . Shortness of breath dyspnea     Past Surgical History:  Procedure Laterality Date  . APPENDECTOMY    . CATARACT EXTRACTION W/PHACO Left 10/15/2015   Procedure: CATARACT EXTRACTION PHACO AND INTRAOCULAR LENS PLACEMENT LEFT EYE;  Surgeon: Tonny Branch, MD;  Location: [facility];  Service: Ophthalmology;  Laterality: Left;  CDE:12.20  . CATARACT EXTRACTION W/PHACO Right 11/12/2015   Procedure: CATARACT EXTRACTION PHACO AND INTRAOCULAR LENS PLACEMENT RIGHT EYE:  CDE:  8.99;  Surgeon: Tonny Branch, MD;  Location: [facility];  Service: Ophthalmology;  Laterality: Right;  . KNEE SURGERY     Left x 2   . MASS EXCISION N/A 09/12/2015   Procedure: EXCISION SOFT TISSUE NEOPLASM (6 CM), BACK;  Surgeon: Aviva Signs, MD;  Location: [facility];  Service: General;  Laterality: N/A;  . 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   Procedure: TOTAL KNEE ARTHROPLASTY;  Surgeon: Paralee Cancel, MD;  Location: [facility];  Service: Orthopedics;  Laterality: Right;  70 mins    There were no vitals filed for this visit.   Subjective Assessment - 11/15/20 1349    Subjective  COVID-19 screen performed prior to patient entering clinic. Reports doing better with less pain.    Pertinent History Previous left knee surgeries, OA, CLL, h/o right shoulder pain.    How long can you walk comfortably? Around home with a FWW.    Patient Stated Goals Get back to golfing.    Currently in Pain? Yes    Pain Score 2     Pain Location Knee    Pain Orientation Right;Medial    Pain Descriptors / Indicators Discomfort    Pain Type Surgical pain    Pain Onset 1 to 4 weeks ago                             [facility] Adult PT Treatment/Exercise - 11/15/20 0001      Knee/Hip Exercises: Aerobic   Recumbent Bike seat 10,9 x12 min      Knee/Hip Exercises: Standing   Forward Lunges Right;2 sets;10 reps;3 seconds    Forward Lunges Limitations off 8" step   PF, DF, calf stretch   Lateral Step Up Right;2 sets;10 reps;Hand Hold: 2;Step Height: 4"    Step Down Right;2 sets;10 reps;Hand Hold: 2;Step Height: 4"    Rocker Board 3 minutes      Knee/Hip Exercises: Seated   Sit to  Sand without UE support;20 reps      Modalities   Modalities Vasopneumatic      Vasopneumatic   Number Minutes Vasopneumatic  10 minutes    Vasopnuematic Location  Knee    Vasopneumatic Pressure Medium    Vasopneumatic Temperature  34 for edema      Manual Therapy   Manual Therapy Passive ROM    Passive ROM Passive ROM6- 115 degrees today                       PT Long Term Goals - 10/31/20 1418      PT LONG TERM GOAL #1   Title Independent with a HEP.    Time 4    Period Weeks    Status On-going      PT LONG TERM GOAL #2   Title Full active right knee extension in order to normalize gait.    Baseline AROM - 7 degrees 10/31/20    Time 4    Period Weeks    Status On-going      PT LONG TERM GOAL #3   Title Active right knee flexion to 120 degrees+ so the patient can perform functional tasks and do so with pain not > 2-3/10.    Baseline AROM 92 degrees 10/31/20    Time  4    Period Weeks    Status On-going      PT LONG TERM GOAL #4   Title Increase right hip and  knee strength to a solid 4+/5 to provide good stability for accomplishment of functional activities.    Time 4    Period Weeks    Status On-going                 Plan - 11/15/20 1430    Clinical Impression Statement Pt arrived today doing fairly well and reports RT knee is moving better with ROM. Rx focused on strengthening and ROM. ROM 6-115 degrees. Normal vaso response    Personal Factors and Comorbidities Comorbidity 1;Comorbidity 2    Comorbidities Previous left knee surgeries, OA, CLL, h/o right shoulder pain.    Examination-Activity Limitations Other;Transfers;Locomotion Level    Stability/Clinical Decision Making Stable/Uncomplicated    Rehab Potential Excellent    PT Frequency 3x / week    PT Duration 4 weeks    PT Treatment/Interventions ADLs/Self Care Home Management;Cryotherapy;Electrical Stimulation;Gait training;Stair training;Functional mobility training;Therapeutic activities;Therapeutic exercise;Neuromuscular re-education;Manual techniques;Patient/family education;Passive range of motion;Vasopneumatic Device    PT Next Visit Plan Progress to stationary bike. FOTO complete on 11th visit.    Recert for more visits    Consulted and Agree with Plan of Care Patient           Patient will benefit from skilled therapeutic intervention in order to improve the following deficits and impairments:  Pain,Decreased range of motion,Decreased strength,Increased edema  Visit Diagnosis: Chronic pain of right knee - Plan: PT plan of care cert/re-cert  Localized edema - Plan: PT plan of care cert/re-cert  Stiffness of right knee, not elsewhere classified - Plan: PT plan of care cert/re-cert  Muscle weakness (generalized) - Plan: PT plan of care cert/re-cert     Problem List Patient Active Problem List   Diagnosis Date Noted  . 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 Right knee OA  10/09/2020  . CLL (chronic lymphocytic leukemia) ([place]) 06/13/2020  . Eustachian tube dysfunction 12/14/2015  . Hyperlipidemia 06/13/2015    APPLEGATE, [place], PTA 11/15/2020, 5:19 PM  [facility] [place], [place], [address] Phone: [phone]   Fax:  [phone]  Name: Travis Wood MRN: [id number] Date of Birth: 1951/10/30

## 2020-11-19 ENCOUNTER — Ambulatory Visit: Payer: Medicare HMO | Admitting: Physical Therapy

## 2020-11-19 ENCOUNTER — Other Ambulatory Visit: Payer: Self-pay

## 2020-11-19 DIAGNOSIS — M25661 Stiffness of right knee, not elsewhere classified: Secondary | ICD-10-CM | POA: Diagnosis not present

## 2020-11-19 DIAGNOSIS — R600 Localized edema: Secondary | ICD-10-CM

## 2020-11-19 DIAGNOSIS — M6281 Muscle weakness (generalized): Secondary | ICD-10-CM

## 2020-11-19 DIAGNOSIS — M25561 Pain in right knee: Secondary | ICD-10-CM | POA: Diagnosis not present

## 2020-11-19 DIAGNOSIS — G8929 Other chronic pain: Secondary | ICD-10-CM | POA: Diagnosis not present

## 2020-11-19 NOTE — Therapy (Signed)
[facility] [place], [place], [address] Phone: [phone]   Fax:  [phone]  Physical Therapy Treatment  Patient Details  Name: Travis Wood MRN: [id number] Date of Birth: Apr 30, 1951 Referring Provider (PT): Paralee Cancel MD   Encounter Date: 11/19/2020   PT End of Session - 11/19/20 1357    Visit Number 13    Number of Visits 18    Date for PT Re-Evaluation 12/07/20    Authorization Type FOTO 11th 27% PROGRESS NOTE AT 10TH VISIT.  KX MODIFIER AFTER 15 VISITS.    PT Start Time [phone]    PT Stop Time 0228    PT Time Calculation (min) 42 min    Activity Tolerance Patient tolerated treatment well    Behavior During Therapy [facility] for tasks assessed/performed           Past Medical History:  Diagnosis Date  . Cataract   . CLL (chronic lymphocytic leukemia) ([place])   . Erectile dysfunction   . History of bronchitis   . History of colon polyps   . Hyperlipemia   . Osteoarthritis   . Shortness of breath dyspnea     Past Surgical History:  Procedure Laterality Date  . APPENDECTOMY    . CATARACT EXTRACTION W/PHACO Left 10/15/2015   Procedure: CATARACT EXTRACTION PHACO AND INTRAOCULAR LENS PLACEMENT LEFT EYE;  Surgeon: Tonny Branch, MD;  Location: [facility];  Service: Ophthalmology;  Laterality: Left;  CDE:12.20  . CATARACT EXTRACTION W/PHACO Right 11/12/2015   Procedure: CATARACT EXTRACTION PHACO AND INTRAOCULAR LENS PLACEMENT RIGHT EYE:  CDE:  8.99;  Surgeon: Tonny Branch, MD;  Location: [facility];  Service: Ophthalmology;  Laterality: Right;  . KNEE SURGERY     Left x 2   . MASS EXCISION N/A 09/12/2015   Procedure: EXCISION SOFT TISSUE NEOPLASM (6 CM), BACK;  Surgeon: Aviva Signs, MD;  Location: [facility];  Service: General;  Laterality: N/A;  . 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   Procedure: TOTAL KNEE ARTHROPLASTY;  Surgeon: Paralee Cancel, MD;  Location: [facility];  Service: Orthopedics;  Laterality: Right;  70 mins     There were no vitals filed for this visit.   Subjective Assessment - 11/19/20 1355    Subjective COVID-19 screen performed prior to patient entering clinic. Patient arrived with less discomfort and did well after last treatment    Pertinent History Previous left knee surgeries, OA, CLL, h/o right shoulder pain.    How long can you walk comfortably? Around home with a FWW.    Patient Stated Goals Get back to golfing.    Currently in Pain? Yes    Pain Score 1     Pain Location Knee    Pain Orientation Right;Medial    Pain Descriptors / Indicators Discomfort    Pain Type Surgical pain    Pain Onset More than a month ago    Pain Frequency Intermittent    Aggravating Factors  increased activity/bending knee    Pain Relieving Factors at rest              [facility] PT Assessment - 11/19/20 0001      AROM   AROM Assessment Site Knee    Right/Left Knee Right    Right Knee Extension -8    Right Knee Flexion 110      PROM   PROM Assessment Site Knee    Right/Left Knee Right    Right Knee Extension -5    Right Knee Flexion  [facility] Adult PT Treatment/Exercise - 11/19/20 0001      Knee/Hip Exercises: Aerobic   Recumbent Bike Bike x 62min adjusted for ROM      Knee/Hip Exercises: Standing   Lateral Step Up Right;20 reps;Step Height: 6"    Step Down Right;20 reps;Step Height: 6"    Rocker Board 3 minutes      Vasopneumatic   Number Minutes Vasopneumatic  10 minutes    Vasopnuematic Location  Knee    Vasopneumatic Pressure Medium    Vasopneumatic Temperature  34 for edema      Manual Therapy   Manual Therapy Passive ROM    Passive ROM manual PROM for right knee flexion and ext to improve mobility                       PT Long Term Goals - 11/19/20 1357      PT LONG TERM GOAL #1   Title Independent with a HEP.    Time 4    Period Weeks    Status On-going      PT LONG TERM GOAL #2   Title Full active right knee  extension in order to normalize gait.    Baseline AROM - 8 degrees 11/19/20    Time 4    Period Weeks    Status On-going      PT LONG TERM GOAL #3   Title Active right knee flexion to 120 degrees+ so the patient can perform functional tasks and do so with pain not > 2-3/10.    Baseline AROM 110 degrees 11/19/20    Time 4    Period Weeks    Status On-going      PT LONG TERM GOAL #4   Title Increase right hip and  knee strength to a solid 4+/5 to provide good stability for accomplishment of functional activities.    Time 4    Period Weeks    Status On-going                 Plan - 11/19/20 1421    Clinical Impression Statement Patient tolerated treatment well today. Patient reported minimal discomfort yet stiff knee today. Patient ROM has improved overall and progressing with right LE strengthening. Goals progressing today.    Personal Factors and Comorbidities Comorbidity 1;Comorbidity 2    Comorbidities Previous left knee surgeries, OA, CLL, h/o right shoulder pain.    Examination-Activity Limitations Other;Transfers;Locomotion Level    Examination-Participation Restrictions Other    Stability/Clinical Decision Making Stable/Uncomplicated    Rehab Potential Excellent    PT Frequency 3x / week    PT Duration 4 weeks    PT Treatment/Interventions ADLs/Self Care Home Management;Cryotherapy;Electrical Stimulation;Gait training;Stair training;Functional mobility training;Therapeutic activities;Therapeutic exercise;Neuromuscular re-education;Manual techniques;Patient/family education;Passive range of motion;Vasopneumatic Device    PT Next Visit Plan cont with POC for ROM and strengthening    Consulted and Agree with Plan of Care Patient           Patient will benefit from skilled therapeutic intervention in order to improve the following deficits and impairments:  Pain,Decreased range of motion,Decreased strength,Increased edema  Visit Diagnosis: Chronic pain of right  knee  Localized edema  Stiffness of right knee, not elsewhere classified  Muscle weakness (generalized)     Problem List Patient Active Problem List   Diagnosis Date  Noted  . 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 Right knee OA 10/09/2020  . CLL (chronic lymphocytic leukemia) ([place]) 06/13/2020  . Eustachian tube dysfunction 12/14/2015  . Hyperlipidemia 06/13/2015    Phillips Climes, PTA 11/19/2020, 2:29 PM  [facility] [address] [place], [place], [address] Phone: [phone]   Fax:  [phone]  Name: Travis Wood MRN: [id number] Date of Birth: 01-11-1951

## 2020-11-21 DIAGNOSIS — Z96651 Presence of right artificial knee joint: Secondary | ICD-10-CM | POA: Diagnosis not present

## 2020-11-21 DIAGNOSIS — Z471 Aftercare following joint replacement surgery: Secondary | ICD-10-CM | POA: Diagnosis not present

## 2020-11-22 ENCOUNTER — Encounter: Payer: Self-pay | Admitting: Physical Therapy

## 2020-11-22 ENCOUNTER — Other Ambulatory Visit: Payer: Self-pay

## 2020-11-22 ENCOUNTER — Ambulatory Visit: Payer: Medicare HMO | Admitting: Physical Therapy

## 2020-11-22 DIAGNOSIS — G8929 Other chronic pain: Secondary | ICD-10-CM | POA: Diagnosis not present

## 2020-11-22 DIAGNOSIS — R600 Localized edema: Secondary | ICD-10-CM | POA: Diagnosis not present

## 2020-11-22 DIAGNOSIS — M25661 Stiffness of right knee, not elsewhere classified: Secondary | ICD-10-CM

## 2020-11-22 DIAGNOSIS — M6281 Muscle weakness (generalized): Secondary | ICD-10-CM

## 2020-11-22 DIAGNOSIS — M25561 Pain in right knee: Secondary | ICD-10-CM | POA: Diagnosis not present

## 2020-11-22 NOTE — Therapy (Signed)
[facility] [place], [place], [address] Phone: [phone]   Fax:  [phone]  Physical Therapy Treatment  Patient Details  Name: Travis Wood MRN: [id number] Date of Birth: [birth year] Referring Provider (PT): Paralee Cancel MD   Encounter Date: 11/22/2020   PT End of Session - 11/22/20 1354    Visit Number 14    Number of Visits 22   per new order   Date for PT Re-Evaluation 12/28/20    Authorization Type FOTO 11th 27% PROGRESS NOTE AT 10TH VISIT.  KX MODIFIER AFTER 15 VISITS.    PT Start Time 1343    PT Stop Time 1433    PT Time Calculation (min) 50 min    Activity Tolerance Patient tolerated treatment well    Behavior During Therapy WFL for tasks assessed/performed           Past Medical History:  Diagnosis Date  . Cataract   . CLL (chronic lymphocytic leukemia) ([place])   . Erectile dysfunction   . History of bronchitis   . History of colon polyps   . Hyperlipemia   . Osteoarthritis   . Shortness of breath dyspnea     Past Surgical History:  Procedure Laterality Date  . APPENDECTOMY    . CATARACT EXTRACTION W/PHACO Left 10/15/2015   Procedure: CATARACT EXTRACTION PHACO AND INTRAOCULAR LENS PLACEMENT LEFT EYE;  Surgeon: Tonny Branch, MD;  Location: [facility];  Service: Ophthalmology;  Laterality: Left;  CDE:12.20  . CATARACT EXTRACTION W/PHACO Right 11/12/2015   Procedure: CATARACT EXTRACTION PHACO AND INTRAOCULAR LENS PLACEMENT RIGHT EYE:  CDE:  8.99;  Surgeon: Tonny Branch, MD;  Location: [facility];  Service: Ophthalmology;  Laterality: Right;  . KNEE SURGERY     Left x 2   . MASS EXCISION N/A 09/12/2015   Procedure: EXCISION SOFT TISSUE NEOPLASM (6 CM), BACK;  Surgeon: Aviva Signs, MD;  Location: [facility];  Service: General;  Laterality: N/A;  . 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   Procedure: TOTAL KNEE ARTHROPLASTY;  Surgeon: Paralee Cancel, MD;  [facility]: [facility];  Service: Orthopedics;  Laterality: Right;  70  mins    There were no vitals filed for this visit.   Subjective Assessment - 11/22/20 1351    Subjective COVID-19 screen performed prior to patient entering clinic. Patient arrived doing well, saw MD and was ordered another 4 weeks of therapy.    Pertinent History Previous left knee surgeries, OA, CLL, h/o right shoulder pain.    How long can you walk comfortably? Around home with a FWW.    Patient Stated Goals Get back to golfing.    Currently in Pain? Yes    Pain Score --   did not provide number on pain scale             [facility] PT Assessment - 11/22/20 0001      Assessment   Medical Diagnosis Right total knee replacement.    Referring Provider (PT) Paralee Cancel MD    Onset Date/Surgical Date 10/09/20    Hand Dominance Left    Next MD Visit 12/19/2020      Precautions   Precautions Other (comment)    Precaution Comments no ultrasound      Restrictions   Weight Bearing Restrictions No                         [facility] Adult PT Treatment/Exercise - 11/22/20 0001  Knee/Hip Exercises: Aerobic   Recumbent Bike Bike x 13min adjusted for ROM seat 10 to 8      Knee/Hip Exercises: Machines for Strengthening   Cybex Knee Extension 10# 3x10 reps    Cybex Knee Flexion 40# 3x10 reps      Knee/Hip Exercises: Standing   Lateral Step Up Right;20 reps;Step Height: 6"    Forward Step Up Right;20 reps;Hand Hold: 2;Step Height: 6"    Step Down Right;20 reps;Step Height: 6"    Rocker Board 3 minutes      Modalities   Modalities Vasopneumatic      Vasopneumatic   Number Minutes Vasopneumatic  10 minutes    Vasopnuematic Location  Knee    Vasopneumatic Pressure Medium    Vasopneumatic Temperature  34 for edema      Manual Therapy   Manual Therapy --    Passive ROM --                       PT Long Term Goals - 11/19/20 1357      PT LONG TERM GOAL #1   Title Independent with a HEP.    Time 4    Period Weeks    Status On-going      PT LONG  TERM GOAL #2   Title Full active right knee extension in order to normalize gait.    Baseline AROM - 8 degrees 11/19/20    Time 4    Period Weeks    Status On-going      PT LONG TERM GOAL #3   Title Active right knee flexion to 120 degrees+ so the patient can perform functional tasks and do so with pain not > 2-3/10.    Baseline AROM 110 degrees 11/19/20    Time 4    Period Weeks    Status On-going      PT LONG TERM GOAL #4   Title Increase right hip and  knee strength to a solid 4+/5 to provide good stability for accomplishment of functional activities.    Time 4    Period Weeks    Status On-going                 Plan - 11/22/20 1423    Clinical Impression Statement Patient responding well to therapy session with minimal reports of increase pain. Patient guided through strengthening TEs with excellent technique and pacing. Slight reports of fatigue after knee flexion and extension machine but overall did well. Normal response to modalities upon removal.    Personal Factors and Comorbidities Comorbidity 1;Comorbidity 2    Comorbidities Previous left knee surgeries, OA, CLL, h/o right shoulder pain.    Examination-Activity Limitations Other;Transfers;Locomotion Level    Examination-Participation Restrictions Other    Stability/Clinical Decision Making Stable/Uncomplicated    Clinical Decision Making Low    Rehab Potential Excellent    PT Frequency 3x / week    PT Duration 4 weeks    PT Treatment/Interventions ADLs/Self Care Home Management;Cryotherapy;Electrical Stimulation;Gait training;Stair training;Functional mobility training;Therapeutic activities;Therapeutic exercise;Neuromuscular re-education;Manual techniques;Patient/family education;Passive range of motion;Vasopneumatic Device    PT Next Visit Plan cont with POC for ROM and strengthening    Consulted and Agree with Plan of Care Patient           Patient will benefit from skilled therapeutic intervention in  order to improve the following deficits and impairments:  Pain,Decreased range of motion,Decreased strength,Increased edema  Visit Diagnosis: Chronic pain of right knee  Localized  edema  Stiffness of right knee, not elsewhere classified  Muscle weakness (generalized)     Problem List Patient Active Problem List   Diagnosis Date Noted  . 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 Right knee OA 10/09/2020  . CLL (chronic lymphocytic leukemia) ([place]) 06/13/2020  . Eustachian tube dysfunction 12/14/2015  . Hyperlipidemia 06/13/2015    Gabriela Eves, PT, DPT 11/22/2020, 3:48 PM  [facility] [address] [place], [place], [address] Phone: [phone]   Fax:  [phone]  Name: Travis Wood MRN: [id number] Date of Birth: 1951-08-20

## 2020-11-27 ENCOUNTER — Other Ambulatory Visit: Payer: Self-pay

## 2020-11-27 ENCOUNTER — Ambulatory Visit: Payer: Medicare HMO | Admitting: Physical Therapy

## 2020-11-27 ENCOUNTER — Encounter: Payer: Self-pay | Admitting: Physical Therapy

## 2020-11-27 DIAGNOSIS — R600 Localized edema: Secondary | ICD-10-CM | POA: Diagnosis not present

## 2020-11-27 DIAGNOSIS — M25661 Stiffness of right knee, not elsewhere classified: Secondary | ICD-10-CM

## 2020-11-27 DIAGNOSIS — M25561 Pain in right knee: Secondary | ICD-10-CM | POA: Diagnosis not present

## 2020-11-27 DIAGNOSIS — G8929 Other chronic pain: Secondary | ICD-10-CM

## 2020-11-27 DIAGNOSIS — M6281 Muscle weakness (generalized): Secondary | ICD-10-CM

## 2020-11-27 NOTE — Therapy (Signed)
[facility] [place], [place], [address] Phone: [phone]   Fax:  [phone]  Physical Therapy Treatment  Patient Details  Name: Travis Wood MRN: [id number] Date of Birth: Nov 22, 1951 Referring Provider (PT): Paralee Cancel MD   Encounter Date: 11/27/2020   PT End of Session - 11/27/20 1437    Visit Number 15    Number of Visits 22    Date for PT Re-Evaluation 12/28/20    Authorization Type FOTO 11th 27% PROGRESS NOTE AT 10TH VISIT.  KX MODIFIER AFTER 15 VISITS.    PT Start Time 1431    PT Stop Time 1526    PT Time Calculation (min) 55 min    Activity Tolerance Patient tolerated treatment well    Behavior During Therapy WFL for tasks assessed/performed           Past Medical History:  Diagnosis Date  . Cataract   . CLL (chronic lymphocytic leukemia) ([place])   . Erectile dysfunction   . History of bronchitis   . History of colon polyps   . Hyperlipemia   . Osteoarthritis   . Shortness of breath dyspnea     Past Surgical History:  Procedure Laterality Date  . APPENDECTOMY    . CATARACT EXTRACTION W/PHACO Left 10/15/2015   Procedure: CATARACT EXTRACTION PHACO AND INTRAOCULAR LENS PLACEMENT LEFT EYE;  Surgeon: Tonny Branch, MD;  Location: [facility];  Service: Ophthalmology;  Laterality: Left;  CDE:12.20  . CATARACT EXTRACTION W/PHACO Right 11/12/2015   Procedure: CATARACT EXTRACTION PHACO AND INTRAOCULAR LENS PLACEMENT RIGHT EYE:  CDE:  8.99;  Surgeon: Tonny Branch, MD;  Location: [facility];  Service: Ophthalmology;  Laterality: Right;  . KNEE SURGERY     Left x 2   . MASS EXCISION N/A 09/12/2015   Procedure: EXCISION SOFT TISSUE NEOPLASM (6 CM), BACK;  Surgeon: Aviva Signs, MD;  Location: [facility];  Service: General;  Laterality: N/A;  . 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   Procedure: TOTAL KNEE ARTHROPLASTY;  Surgeon: Paralee Cancel, MD;  Location: [facility];  Service: Orthopedics;  Laterality: Right;  70 mins     There were no vitals filed for this visit.   Subjective Assessment - 11/27/20 1436    Subjective COVID-19 screen performed prior to patient entering clinic. Patient reports doing alright, states feeling stiff.    Pertinent History Previous left knee surgeries, OA, CLL, h/o right shoulder pain.    How long can you walk comfortably? Around home with a FWW.    Patient Stated Goals Get back to golfing.    Currently in Pain? No/denies              [facility] PT Assessment - 11/27/20 0001      Assessment   Medical Diagnosis Right total knee replacement.    Referring Provider (PT) Paralee Cancel MD    Onset Date/Surgical Date 10/09/20    Hand Dominance Left    Next MD Visit 12/19/2020      Precautions   Precautions Other (comment)    Precaution Comments no ultrasound      Restrictions   Weight Bearing Restrictions No      Observation/Other Assessments   Focus on Therapeutic Outcomes (FOTO)  27% limitation                         [facility] Adult PT Treatment/Exercise - 11/27/20 0001      Knee/Hip Exercises: Aerobic   Recumbent Bike Bike  x adjusted for ROM seat 10 to 8      Knee/Hip Exercises: Machines for Strengthening   Cybex Knee Extension 10# 3x10 reps    Cybex Knee Flexion 40# 3x10 reps    Cybex Leg Press 2 pl, seat 7 3x10 reps      Knee/Hip Exercises: Standing   Other Standing Knee Exercises lateral stepping on balance beam x3 minutes      Knee/Hip Exercises: Supine   Heel Prop for Knee Extension 3 minutes;Weight    Heel Prop for Knee Extension Weight (lbs) 4#      Modalities   Modalities Vasopneumatic      Vasopneumatic   Number Minutes Vasopneumatic  10 minutes    Vasopnuematic Location  Knee    Vasopneumatic Pressure Medium    Vasopneumatic Temperature  34 for edema                       PT Long Term Goals - 11/19/20 1357      PT LONG TERM GOAL #1   Title Independent with a HEP.    Time 4    Period Weeks    Status  On-going      PT LONG TERM GOAL #2   Title Full active right knee extension in order to normalize gait.    Baseline AROM - 8 degrees 11/19/20    Time 4    Period Weeks    Status On-going      PT LONG TERM GOAL #3   Title Active right knee flexion to 120 degrees+ so the patient can perform functional tasks and do so with pain not > 2-3/10.    Baseline AROM 110 degrees 11/19/20    Time 4    Period Weeks    Status On-going      PT LONG TERM GOAL #4   Title Increase right hip and  knee strength to a solid 4+/5 to provide good stability for accomplishment of functional activities.    Time 4    Period Weeks    Status On-going                 Plan - 11/27/20 1515    Clinical Impression Statement Patient responded well to therapy session though with muscle fatigue after strengthening machines. Patient reported doing well functionally with home activities but is still cautious with doing activities outdoors due to the changes in terrain. Patient and PT discussed performing more balance activities to strengthen appropriate balance strategies. Patient reported understanding. No adverse affects upon removal of modalities.    Personal Factors and Comorbidities Comorbidity 1;Comorbidity 2    Comorbidities Previous left knee surgeries, OA, CLL, h/o right shoulder pain.    Examination-Activity Limitations Other;Transfers;Locomotion Level    Examination-Participation Restrictions Other    Stability/Clinical Decision Making Stable/Uncomplicated    Clinical Decision Making Low    Rehab Potential Excellent    PT Frequency 3x / week    PT Duration 4 weeks    PT Treatment/Interventions ADLs/Self Care Home Management;Cryotherapy;Electrical Stimulation;Gait training;Stair training;Functional mobility training;Therapeutic activities;Therapeutic exercise;Neuromuscular re-education;Manual techniques;Patient/family education;Passive range of motion;Vasopneumatic Device    PT Next Visit Plan cont with  POC for ROM and strengthening    Consulted and Agree with Plan of Care Patient           Patient will benefit from skilled therapeutic intervention in order to improve the following deficits and impairments:  Pain,Decreased range of motion,Decreased strength,Increased edema  Visit Diagnosis: Chronic  pain of right knee  Localized edema  Stiffness of right knee, not elsewhere classified  Muscle weakness (generalized)     Problem List Patient Active Problem List   Diagnosis Date Noted  . 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 Right knee OA 10/09/2020  . CLL (chronic lymphocytic leukemia) ([facility]) 06/13/2020  . Eustachian tube dysfunction 12/14/2015  . Hyperlipidemia 06/13/2015    Guss Bunde, PT, DPT 11/27/2020, 3:35 PM  [facility] [address] [place], [place], [address] Phone: [phone]   Fax:  [phone]  Name: Travis Wood MRN: [id number] Date of Birth: Jan 19, 1951

## 2020-11-29 ENCOUNTER — Other Ambulatory Visit: Payer: Self-pay

## 2020-11-29 ENCOUNTER — Ambulatory Visit: Payer: Medicare HMO | Admitting: *Deleted

## 2020-11-29 DIAGNOSIS — G8929 Other chronic pain: Secondary | ICD-10-CM | POA: Diagnosis not present

## 2020-11-29 DIAGNOSIS — M25561 Pain in right knee: Secondary | ICD-10-CM | POA: Diagnosis not present

## 2020-11-29 DIAGNOSIS — R600 Localized edema: Secondary | ICD-10-CM | POA: Diagnosis not present

## 2020-11-29 DIAGNOSIS — M25661 Stiffness of right knee, not elsewhere classified: Secondary | ICD-10-CM | POA: Diagnosis not present

## 2020-11-29 DIAGNOSIS — M6281 Muscle weakness (generalized): Secondary | ICD-10-CM | POA: Diagnosis not present

## 2020-11-29 NOTE — Therapy (Signed)
[facility] [address] [place], [place], [address] Phone: [phone]   Fax:  [phone]  Physical Therapy Treatment  Patient Details  Name: Travis Wood MRN: [id number] Date of Birth: [birth year] Referring Provider (PT): Durene Romans MD   Encounter Date: 11/29/2020   PT End of Session - 11/29/20 1455    Visit Number 16    Number of Visits 22    Date for PT Re-Evaluation 12/28/20    Authorization Type FOTO 11th 27% PROGRESS NOTE AT 10TH VISIT.  KX MODIFIER AFTER 15 VISITS.    PT Start Time 1430    PT Stop Time 1520    PT Time Calculation (min) 50 min           Past Medical History:  Diagnosis Date  . Cataract   . CLL (chronic lymphocytic leukemia) (HCC)   . Erectile dysfunction   . History of bronchitis   . History of colon polyps   . Hyperlipemia   . Osteoarthritis   . Shortness of breath dyspnea     Past Surgical History:  Procedure Laterality Date  . APPENDECTOMY    . CATARACT EXTRACTION W/PHACO Left 10/15/2015   Procedure: CATARACT EXTRACTION PHACO AND INTRAOCULAR LENS PLACEMENT LEFT EYE;  Surgeon: Gemma Payor, MD;  Location: [facility];  Service: Ophthalmology;  Laterality: Left;  CDE:12.20  . CATARACT EXTRACTION W/PHACO Right 11/12/2015   Procedure: CATARACT EXTRACTION PHACO AND INTRAOCULAR LENS PLACEMENT RIGHT EYE:  CDE:  8.99;  Surgeon: Gemma Payor, MD;  Location: [facility];  Service: Ophthalmology;  Laterality: Right;  . KNEE SURGERY     Left x 2   . MASS EXCISION N/A 09/12/2015   Procedure: EXCISION SOFT TISSUE NEOPLASM (6 CM), BACK;  Surgeon: Franky Macho, MD;  Location: [facility];  Service: General;  Laterality: N/A;  . 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   Procedure: TOTAL KNEE ARTHROPLASTY;  Surgeon: Durene Romans, MD;  Location: [facility];  Service: Orthopedics;  Laterality: Right;  70 mins    There were no vitals filed for this visit.   Subjective Assessment - 11/29/20 1453    Subjective COVID-19 screen  performed prior to patient entering clinic. Patient reports doing alright, states feeling stiff RT knee.    Pertinent History Previous left knee surgeries, OA, CLL, h/o right shoulder pain.    How long can you walk comfortably? Around home with a FWW.    Patient Stated Goals Get back to golfing.    Currently in Pain? No/denies    Pain Location Knee    Pain Orientation Right;Medial    Pain Descriptors / Indicators Discomfort    Pain Type Surgical pain    Pain Onset More than a month ago                             [facility] Adult PT Treatment/Exercise - 11/29/20 0001      Knee/Hip Exercises: Aerobic   Recumbent Bike Bike x adjusted for ROM seat 10 to 8      Knee/Hip Exercises: Standing   Forward Step Up Right;20 reps;Hand Hold: 2;Step Height: 6"    Step Down Right;20 reps;Step Height: 6"    Rocker Board 3 minutes    SLS RT SLS x 3 mins      Modalities   Modalities Vasopneumatic      Vasopneumatic   Number Minutes Vasopneumatic  10 minutes    Vasopnuematic Location  Knee  Vasopneumatic Pressure Medium    Vasopneumatic Temperature  34 for edema      Manual Therapy   Manual Therapy Passive ROM    Passive ROM manual PROM for right knee flexion and ext to improve mobility  115 degrees                       PT Long Term Goals - 11/19/20 1357      PT LONG TERM GOAL #1   Title Independent with a HEP.    Time 4    Period Weeks    Status On-going      PT LONG TERM GOAL #2   Title Full active right knee extension in order to normalize gait.    Baseline AROM - 8 degrees 11/19/20    Time 4    Period Weeks    Status On-going      PT LONG TERM GOAL #3   Title Active right knee flexion to 120 degrees+ so the patient can perform functional tasks and do so with pain not > 2-3/10.    Baseline AROM 110 degrees 11/19/20    Time 4    Period Weeks    Status On-going      PT LONG TERM GOAL #4   Title Increase right hip and  knee strength to a  solid 4+/5 to provide good stability for accomplishment of functional activities.    Time 4    Period Weeks    Status On-going                 Plan - 11/29/20 1638    Clinical Impression Statement Pt arrived today reporting that he feels that his knee is doing very well. Rx focused on ROM and strengthening as well as balance. SLS on RT LE was still challenging for Pt. Pt continues to progress. Pt reports stairs are getting easier    Personal Factors and Comorbidities Comorbidity 1;Comorbidity 2    Comorbidities Previous left knee surgeries, OA, CLL, h/o right shoulder pain.    Examination-Activity Limitations Other;Transfers;Locomotion Level    Stability/Clinical Decision Making Stable/Uncomplicated    PT Treatment/Interventions ADLs/Self Care Home Management;Cryotherapy;Electrical Stimulation;Gait training;Stair training;Functional mobility training;Therapeutic activities;Therapeutic exercise;Neuromuscular re-education;Manual techniques;Patient/family education;Passive range of motion;Vasopneumatic Device    PT Next Visit Plan cont with POC for ROM and strengthening    Consulted and Agree with Plan of Care Patient           Patient will benefit from skilled therapeutic intervention in order to improve the following deficits and impairments:  Pain,Decreased range of motion,Decreased strength,Increased edema  Visit Diagnosis: Chronic pain of right knee  Localized edema  Stiffness of right knee, not elsewhere classified  Muscle weakness (generalized)     Problem List Patient Active Problem List   Diagnosis Date Noted  . 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 Right knee OA 10/09/2020  . CLL (chronic lymphocytic leukemia) (HCC) 06/13/2020  . Eustachian tube dysfunction 12/14/2015  . Hyperlipidemia 06/13/2015    Bernita Beckstrom,CHRIS, PTA 11/29/2020, 4:43 PM  [facility] [address] [place], [place], [address] Phone: [phone]   Fax:   [phone]  Name: Travis Wood MRN: [id number] Date of Birth: September 09, 1951

## 2020-12-04 ENCOUNTER — Ambulatory Visit: Payer: Medicare HMO | Admitting: Physical Therapy

## 2020-12-06 ENCOUNTER — Other Ambulatory Visit: Payer: Self-pay

## 2020-12-06 ENCOUNTER — Ambulatory Visit: Payer: Medicare HMO | Attending: Orthopedic Surgery | Admitting: Physical Therapy

## 2020-12-06 ENCOUNTER — Encounter: Payer: Self-pay | Admitting: Physical Therapy

## 2020-12-06 DIAGNOSIS — M6281 Muscle weakness (generalized): Secondary | ICD-10-CM | POA: Diagnosis not present

## 2020-12-06 DIAGNOSIS — M25561 Pain in right knee: Secondary | ICD-10-CM | POA: Insufficient documentation

## 2020-12-06 DIAGNOSIS — R600 Localized edema: Secondary | ICD-10-CM | POA: Insufficient documentation

## 2020-12-06 DIAGNOSIS — G8929 Other chronic pain: Secondary | ICD-10-CM | POA: Diagnosis not present

## 2020-12-06 DIAGNOSIS — M25661 Stiffness of right knee, not elsewhere classified: Secondary | ICD-10-CM | POA: Diagnosis not present
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
## 2020-12-10 ENCOUNTER — Encounter: Payer: Self-pay | Admitting: Physical Therapy

## 2020-12-10 ENCOUNTER — Other Ambulatory Visit: Payer: Self-pay

## 2020-12-10 ENCOUNTER — Ambulatory Visit: Payer: Medicare HMO | Admitting: Physical Therapy

## 2020-12-10 DIAGNOSIS — G8929 Other chronic pain: Secondary | ICD-10-CM

## 2020-12-10 DIAGNOSIS — M6281 Muscle weakness (generalized): Secondary | ICD-10-CM

## 2020-12-10 DIAGNOSIS — M25661 Stiffness of right knee, not elsewhere classified: Secondary | ICD-10-CM | POA: Diagnosis not present

## 2020-12-10 DIAGNOSIS — M25561 Pain in right knee: Secondary | ICD-10-CM

## 2020-12-10 DIAGNOSIS — R600 Localized edema: Secondary | ICD-10-CM

## 2020-12-10 NOTE — Therapy (Signed)
[facility] [place], [place], [address] Phone: [phone]   Fax:  [phone]  Physical Therapy Treatment  Patient Details  Name: Travis Wood MRN: [id number] Date of Birth: 06/30/1951 Referring Provider (PT): Paralee Cancel MD   Encounter Date: 12/10/2020   PT End of Session - 12/10/20 1306    Visit Number 18    Number of Visits 22    Date for PT Re-Evaluation 12/28/20    Authorization Type FOTO 11th 27% PROGRESS NOTE AT 10TH VISIT.  KX MODIFIER AFTER 15 VISITS.    PT Start Time 1305    PT Stop Time 1347    PT Time Calculation (min) 42 min    Activity Tolerance Patient tolerated treatment well    Behavior During Therapy WFL for tasks assessed/performed           Past Medical History:  Diagnosis Date  . Cataract   . CLL (chronic lymphocytic leukemia) ([place])   . Erectile dysfunction   . History of bronchitis   . History of colon polyps   . Hyperlipemia   . Osteoarthritis   . Shortness of breath dyspnea     Past Surgical History:  Procedure Laterality Date  . APPENDECTOMY    . CATARACT EXTRACTION W/PHACO Left 10/15/2015   Procedure: CATARACT EXTRACTION PHACO AND INTRAOCULAR LENS PLACEMENT LEFT EYE;  Surgeon: Tonny Branch, MD;  Location: [facility];  Service: Ophthalmology;  Laterality: Left;  CDE:12.20  . CATARACT EXTRACTION W/PHACO Right 11/12/2015   Procedure: CATARACT EXTRACTION PHACO AND INTRAOCULAR LENS PLACEMENT RIGHT EYE:  CDE:  8.99;  Surgeon: Tonny Branch, MD;  Location: [facility];  Service: Ophthalmology;  Laterality: Right;  . KNEE SURGERY     Left x 2   . MASS EXCISION N/A 09/12/2015   Procedure: EXCISION SOFT TISSUE NEOPLASM (6 CM), BACK;  Surgeon: Aviva Signs, MD;  Location: [facility];  Service: General;  Laterality: N/A;  . 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   Procedure: TOTAL KNEE ARTHROPLASTY;  Surgeon: Paralee Cancel, MD;  Location: [facility];  Service: Orthopedics;  Laterality: Right;  70 mins     There were no vitals filed for this visit.   Subjective Assessment - 12/10/20 1305    Subjective COVID-19 screen performed prior to patient entering clinic. Patient reports doing alright.    Pertinent History Previous left knee surgeries, OA, CLL, h/o right shoulder pain.    How long can you walk comfortably? Around home with a FWW.    Patient Stated Goals Get back to golfing.    Currently in Pain? No/denies              [facility] PT Assessment - 12/10/20 0001      Assessment   Medical Diagnosis Right total knee replacement.    Referring Provider (PT) Paralee Cancel MD    Onset Date/Surgical Date 10/09/20    Hand Dominance Left    Next MD Visit 12/19/2020      ROM / Strength   AROM / PROM / Strength AROM      AROM   Overall AROM  Deficits;Within functional limits for tasks performed    AROM Assessment Site Knee    Right/Left Knee Right    Right Knee Extension 9    Right Knee Flexion 123                         [facility] Adult PT Treatment/Exercise - 12/10/20 0001  Ambulation/Gait   Stairs Yes    Stairs Assistance 6: Modified independent (Device/Increase time)    Stair Management Technique One rail Right;Alternating pattern;Forwards    Number of Stairs 4   x1 RT   Height of Stairs 6.5      Knee/Hip Exercises: Aerobic   Recumbent Bike L4, seat 6-4 x15 min      Knee/Hip Exercises: Machines for Strengthening   Cybex Knee Extension 20# 3x10 reps    Cybex Knee Flexion 40# 3x10 reps    Cybex Leg Press 2.5pl ,seat 6 x30 reps      Knee/Hip Exercises: Standing   Step Down Right;20 reps;Step Height: 6"    SLS RLE SLS on airex multiple reps    Walking with Sports Cord 4D resisted walk orange XTS x10 reps esach                       PT Long Term Goals - 12/10/20 1347      PT LONG TERM GOAL #1   Title Independent with a HEP.    Time 4    Period Weeks    Status On-going      PT LONG TERM GOAL #2   Title Full active right knee extension in  order to normalize gait.    Baseline AROM - 8 degrees 11/19/20    Time 4    Period Weeks    Status On-going      PT LONG TERM GOAL #3   Title Active right knee flexion to 120 degrees+ so the patient can perform functional tasks and do so with pain not > 2-3/10.    Baseline AROM 110 degrees 11/19/20    Time 4    Period Weeks    Status Achieved      PT LONG TERM GOAL #4   Title Increase right hip and  knee strength to a solid 4+/5 to provide good stability for accomplishment of functional activities.    Time 4    Period Weeks    Status On-going      PT LONG TERM GOAL #5   Title Perform a reciprocating stair gait with one railing with pain not > 2-3/10.    Time 4    Period Weeks    Status Achieved                 Plan - 12/10/20 1351    Clinical Impression Statement Patient presented in clinic with reports of no pain or stiffness. Patient able to self progress seat for ROM on stationary bike without difficulty. Patient's RLE strengthening also progressed with resistance and reps. Patient able to reciprically ambulate stairs. AROM of R knee still limited with knee extension at 9 deg from neutral. AROM R knee flexion WNL at 123 deg.    Personal Factors and Comorbidities Comorbidity 1;Comorbidity 2    Comorbidities Previous left knee surgeries, OA, CLL, h/o right shoulder pain.    Examination-Activity Limitations Other;Transfers;Locomotion Level    Examination-Participation Restrictions Other    Stability/Clinical Decision Making Stable/Uncomplicated    Rehab Potential Excellent    PT Frequency 3x / week    PT Duration 4 weeks    PT Treatment/Interventions ADLs/Self Care Home Management;Cryotherapy;Electrical Stimulation;Gait training;Stair training;Functional mobility training;Therapeutic activities;Therapeutic exercise;Neuromuscular re-education;Manual techniques;Patient/family education;Passive range of motion;Vasopneumatic Device    PT Next Visit Plan cont with POC for ROM  and strengthening    Consulted and Agree with Plan of Care Patient  Patient will benefit from skilled therapeutic intervention in order to improve the following deficits and impairments:  Pain,Decreased range of motion,Decreased strength,Increased edema  Visit Diagnosis: Chronic pain of right knee  Localized edema  Stiffness of right knee, not elsewhere classified  Muscle weakness (generalized)     Problem List Patient Active Problem List   Diagnosis Date Noted  . 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 Right knee OA 10/09/2020  . CLL (chronic lymphocytic leukemia) ([place]) 06/13/2020  . Eustachian tube dysfunction 12/14/2015  . Hyperlipidemia 06/13/2015    Standley Brooking, PTA 12/10/2020, 1:56 PM  [facility] [address] [place], [place], [address] Phone: [phone]   Fax:  [phone]  Name: Travis Wood MRN: [id number] Date of Birth: December 10, 1950

## 2020-12-12 ENCOUNTER — Other Ambulatory Visit: Payer: Self-pay

## 2020-12-12 ENCOUNTER — Ambulatory Visit: Payer: Medicare HMO | Admitting: Physical Therapy

## 2020-12-12 DIAGNOSIS — M25561 Pain in right knee: Secondary | ICD-10-CM

## 2020-12-12 DIAGNOSIS — M6281 Muscle weakness (generalized): Secondary | ICD-10-CM | POA: Diagnosis not present

## 2020-12-12 DIAGNOSIS — M25661 Stiffness of right knee, not elsewhere classified: Secondary | ICD-10-CM | POA: Diagnosis not present

## 2020-12-12 DIAGNOSIS — R600 Localized edema: Secondary | ICD-10-CM | POA: Diagnosis not present

## 2020-12-12 DIAGNOSIS — G8929 Other chronic pain: Secondary | ICD-10-CM | POA: Diagnosis not present

## 2020-12-12 NOTE — Therapy (Signed)
[facility] [place], [place], [address] Phone: [phone]   Fax:  [phone]  Physical Therapy Treatment  Patient Details  Name: Travis Wood MRN: [id number] Date of Birth: Nov 25, 1951 Referring Provider (PT): Paralee Cancel MD   Encounter Date: 12/12/2020   PT End of Session - 12/12/20 1351    Visit Number 19    Number of Visits 22    Date for PT Re-Evaluation 12/28/20    Authorization Type FOTO 15th 27% PROGRESS NOTE AT 10TH VISIT.  KX MODIFIER AFTER 15 VISITS.    PT Start Time 0148    PT Stop Time 0229    PT Time Calculation (min) 41 min    Activity Tolerance Patient tolerated treatment well    Behavior During Therapy [facility] for tasks assessed/performed           Past Medical History:  Diagnosis Date  . Cataract   . CLL (chronic lymphocytic leukemia) ([place])   . Erectile dysfunction   . History of bronchitis   . History of colon polyps   . Hyperlipemia   . Osteoarthritis   . Shortness of breath dyspnea     Past Surgical History:  Procedure Laterality Date  . APPENDECTOMY    . CATARACT EXTRACTION W/PHACO Left 10/15/2015   Procedure: CATARACT EXTRACTION PHACO AND INTRAOCULAR LENS PLACEMENT LEFT EYE;  Surgeon: Tonny Branch, MD;  Location: [facility];  Service: Ophthalmology;  Laterality: Left;  CDE:12.20  . CATARACT EXTRACTION W/PHACO Right 11/12/2015   Procedure: CATARACT EXTRACTION PHACO AND INTRAOCULAR LENS PLACEMENT RIGHT EYE:  CDE:  8.99;  Surgeon: Tonny Branch, MD;  Location: [facility];  Service: Ophthalmology;  Laterality: Right;  . KNEE SURGERY     Left x 2   . MASS EXCISION N/A 09/12/2015   Procedure: EXCISION SOFT TISSUE NEOPLASM (6 CM), BACK;  Surgeon: Aviva Signs, MD;  Location: [facility];  Service: General;  Laterality: N/A;  . 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   Procedure: TOTAL KNEE ARTHROPLASTY;  Surgeon: Paralee Cancel, MD;  Location: [facility];  Service: Orthopedics;  Laterality: Right;  70 mins     There were no vitals filed for this visit.   Subjective Assessment - 12/12/20 1350    Subjective COVID-19 screen performed prior to patient entering clinic. Patient arrived with no pain only stiffness in knee today.    Pertinent History Previous left knee surgeries, OA, CLL, h/o right shoulder pain.    How long can you walk comfortably? Around home with a FWW.    Patient Stated Goals Get back to golfing.    Currently in Pain? No/denies              [facility] PT Assessment - 12/12/20 0001      AROM   AROM Assessment Site Knee    Right/Left Knee Right    Right Knee Extension -10      PROM   PROM Assessment Site Knee    Right/Left Knee Right    Right Knee Extension -6                         [facility] Adult PT Treatment/Exercise - 12/12/20 0001      Knee/Hip Exercises: Aerobic   Recumbent Bike L4, seat 6-4 x15 min      Knee/Hip Exercises: Machines for Strengthening   Cybex Knee Extension 20# 3x10 reps    Cybex Knee Flexion 40# 3x10 reps    Cybex Leg Press  2.5pl ,seat 6 x30 reps      Knee/Hip Exercises: Supine   Bridges Strengthening;20 reps    Straight Leg Raise with External Rotation Strengthening;Right;20 reps      Manual Therapy   Manual Therapy Passive ROM    Passive ROM PROM for ext stretching to improve mobility                  PT Education - 12/12/20 1407    Education Details HEP progression    Person(s) Educated Patient    Methods Explanation;Demonstration;Handout    Comprehension Verbalized understanding;Returned demonstration               PT Long Term Goals - 12/12/20 1352      PT LONG TERM GOAL #1   Title Independent with a HEP.    Baseline MET 12/12/20    Time 4    Period Weeks    Status Achieved      PT LONG TERM GOAL #2   Title Full active right knee extension in order to normalize gait.    Baseline AROM -10 degrees 12/12/20    Time 4    Period Weeks    Status On-going      PT LONG TERM GOAL #3   Title Active  right knee flexion to 120 degrees+ so the patient can perform functional tasks and do so with pain not > 2-3/10.    Time 4    Period Weeks    Status Achieved      PT LONG TERM GOAL #4   Title Increase right hip and  knee strength to a solid 4+/5 to provide good stability for accomplishment of functional activities.    Time 4    Period Weeks    Status On-going      PT LONG TERM GOAL #5   Title Perform a reciprocating stair gait with one railing with pain not > 2-3/10.    Time 4    Period Weeks    Status Achieved                 Plan - 12/12/20 1428    Clinical Impression Statement Patient tolerated treatment well today. Patient progressing with right LE strengthening yet ongoing limitations with knee ext. Today issued HEP for home progression for knee ext stretch and strengthening. Patient met LTG#1 with remaining ongoing.    Personal Factors and Comorbidities Comorbidity 1;Comorbidity 2    Comorbidities Previous left knee surgeries, OA, CLL, h/o right shoulder pain.    Examination-Activity Limitations Other;Transfers;Locomotion Level    Examination-Participation Restrictions Other    Stability/Clinical Decision Making Stable/Uncomplicated    Rehab Potential Excellent    PT Frequency 3x / week    PT Duration 4 weeks    PT Treatment/Interventions ADLs/Self Care Home Management;Cryotherapy;Electrical Stimulation;Gait training;Stair training;Functional mobility training;Therapeutic activities;Therapeutic exercise;Neuromuscular re-education;Manual techniques;Patient/family education;Passive range of motion;Vasopneumatic Device    PT Next Visit Plan cont with POC for ext ROM and strengthening/ FOTO and progress next visit           Patient will benefit from skilled therapeutic intervention in order to improve the following deficits and impairments:  Pain,Decreased range of motion,Decreased strength,Increased edema  Visit Diagnosis: Chronic pain of right knee  Localized  edema  Stiffness of right knee, not elsewhere classified  Muscle weakness (generalized)     Problem List Patient Active Problem List   Diagnosis Date Noted  . 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 Right knee OA 10/09/2020  .  CLL (chronic lymphocytic leukemia) ([place]) 06/13/2020  . Eustachian tube dysfunction 12/14/2015  . Hyperlipidemia 06/13/2015    Shiro Ellerman P, PTA 12/12/2020, 2:32 PM  [facility] [place], [place], [address] Phone: [phone]   Fax:  [phone]  Name: ORDELL PRICHETT MRN: [id number] Date of Birth: 05-10-1951

## 2020-12-12 NOTE — Patient Instructions (Signed)
KNEE: Knee Hang - Prone   Lie on stomach. Place towel above knee; hang feet off surface. Keep feet straight. Hold _60__ seconds. _5__ reps per set, __2-4_ sets per day Add _0+__ lb weights to ankles.   Step-Down / Step-Up   Stand on stair step or __6__ inch stool. Slowly bend left leg, lowering other foot to floor. Return by straightening front leg. Repeat __10__ times per set. Do __2-3__ sets per session. Do __2-3__ sessions per day.    Strengthening: Hip Abduction (Side-Lying)   Tighten muscles on front of left thigh, then lift leg __5__ inches from surface, keeping knee locked.  Repeat __10__ times per set. Do __2__ sets per session. Do _2___ sessions per day.    Straight Leg Raise  Tighten stomach and slowly raise locked right leg __4__ inches from floor. Repeat __10-30__ times per set. Do __2__ sets per session. Do __2__ sessions per day.  Bridging  Slowly raise buttocks from floor, keeping stomach tight. Repeat _10___ times per set. Do __2__ sets per session. Do __2__ sessions per day.

## 2020-12-18 ENCOUNTER — Ambulatory Visit: Payer: Medicare HMO | Admitting: Physical Therapy

## 2020-12-20 ENCOUNTER — Other Ambulatory Visit: Payer: Self-pay

## 2020-12-20 ENCOUNTER — Ambulatory Visit: Payer: Medicare HMO | Admitting: Physical Therapy

## 2020-12-20 DIAGNOSIS — M25661 Stiffness of right knee, not elsewhere classified: Secondary | ICD-10-CM

## 2020-12-20 DIAGNOSIS — M25561 Pain in right knee: Secondary | ICD-10-CM | POA: Diagnosis not present

## 2020-12-20 DIAGNOSIS — M6281 Muscle weakness (generalized): Secondary | ICD-10-CM | POA: Diagnosis not present

## 2020-12-20 DIAGNOSIS — R600 Localized edema: Secondary | ICD-10-CM | POA: Diagnosis not present

## 2020-12-20 DIAGNOSIS — G8929 Other chronic pain: Secondary | ICD-10-CM | POA: Diagnosis not present

## 2020-12-20 NOTE — Therapy (Signed)
[facility] [place], [place], [address] Phone: [phone]   Fax:  [phone]  Physical Therapy Treatment Progress Note Reporting Period 11/09/2021 to 12/20/2020  See note below for Objective Data and Assessment of Progress/Goals. Patient responding well to therapy, still with ongoing extension lag. Improvements in pain as well.    Patient Details  Name: Travis Wood MRN: [id number] Date of Birth: 10-03-1951 Referring Provider (PT): Paralee Cancel MD   Encounter Date: 12/20/2020   PT End of Session - 12/20/20 1351    Visit Number 20    Number of Visits 22    Date for PT Re-Evaluation 12/28/20    Authorization Type FOTO 20th 15% PROGRESS NOTE AT 10TH VISIT.  KX MODIFIER AFTER 15 VISITS.    PT Start Time 0149    PT Stop Time 0229    PT Time Calculation (min) 40 min    Activity Tolerance Patient tolerated treatment well    Behavior During Therapy [facility] for tasks assessed/performed           Past Medical History:  Diagnosis Date  . Cataract   . CLL (chronic lymphocytic leukemia) ([place])   . Erectile dysfunction   . History of bronchitis   . History of colon polyps   . Hyperlipemia   . Osteoarthritis   . Shortness of breath dyspnea     Past Surgical History:  Procedure Laterality Date  . APPENDECTOMY    . CATARACT EXTRACTION W/PHACO Left 10/15/2015   Procedure: CATARACT EXTRACTION PHACO AND INTRAOCULAR LENS PLACEMENT LEFT EYE;  Surgeon: Tonny Branch, MD;  Location: [facility];  Service: Ophthalmology;  Laterality: Left;  CDE:12.20  . CATARACT EXTRACTION W/PHACO Right 11/12/2015   Procedure: CATARACT EXTRACTION PHACO AND INTRAOCULAR LENS PLACEMENT RIGHT EYE:  CDE:  8.99;  Surgeon: Tonny Branch, MD;  Location: [facility];  Service: Ophthalmology;  Laterality: Right;  . KNEE SURGERY     Left x 2   . MASS EXCISION N/A 09/12/2015   Procedure: EXCISION SOFT TISSUE NEOPLASM (6 CM), BACK;  Surgeon: Aviva Signs, MD;  Location: [facility];  Service: General;  Laterality: N/A;  . 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   Procedure: TOTAL KNEE ARTHROPLASTY;  Surgeon: Paralee Cancel, MD;  Location: [facility];  Service: Orthopedics;  Laterality: Right;  70 mins    There were no vitals filed for this visit.   Subjective Assessment - 12/20/20 1350    Subjective COVID-19 screen performed prior to patient entering clinic. Patient arrived with no pain only stiffness in knee per reported.    Pertinent History Previous left knee surgeries, OA, CLL, h/o right shoulder pain.    How long can you walk comfortably? Around home with a FWW.    Patient Stated Goals Get back to golfing.    Currently in Pain? No/denies              [facility] PT Assessment - 12/20/20 0001      AROM   AROM Assessment Site Knee    Right/Left Knee Right    Right Knee Extension -8      PROM   PROM Assessment Site Knee    Right/Left Knee Right    Right Knee Extension -4                         [facility] Adult PT Treatment/Exercise - 12/20/20 0001      Knee/Hip Exercises: Aerobic   Recumbent Bike L4, seat  6-4 x15 min      Knee/Hip Exercises: Machines for Strengthening   Cybex Knee Extension 20# 3x10 reps    Cybex Knee Flexion 40# 3x10 reps    Cybex Leg Press 2.5pl ,seat 6 x30 reps      Knee/Hip Exercises: Standing   Hip Abduction Stengthening;Right;20 reps;Knee straight    Abduction Limitations w green band    Step Down Right;20 reps;Step Height: 6"      Manual Therapy   Manual Therapy Passive ROM    Passive ROM PROM for ext stretching, ossilations and overpressure holds to improve mobility                       PT Long Term Goals - 12/20/20 1352      PT LONG TERM GOAL #1   Title Independent with a HEP.    Baseline MET 12/12/20    Time 4    Period Weeks    Status Achieved      PT LONG TERM GOAL #2   Title Full active right knee extension in order to normalize gait.    Baseline AROM -8 degrees 12/20/20    Time 4     Period Weeks    Status On-going      PT LONG TERM GOAL #3   Title Active right knee flexion to 120 degrees+ so the patient can perform functional tasks and do so with pain not > 2-3/10.    Time 4    Period Weeks    Status Achieved      PT LONG TERM GOAL #4   Title Increase right hip and  knee strength to a solid 4+/5 to provide good stability for accomplishment of functional activities.    Time 4    Period Weeks    Status On-going      PT LONG TERM GOAL #5   Title Perform a reciprocating stair gait with one railing with pain not > 2-3/10.    Time 4    Period Weeks    Status Achieved                 Plan - 12/20/20 1413    Clinical Impression Statement Patient tolerated treatment well today and progressing with PRE's today. No pain throughout treatment. Patient has continued to progress with ROM in knee yet not full ROM for ext. Patient remaining goals progressing this week.    Personal Factors and Comorbidities Comorbidity 1;Comorbidity 2    Comorbidities Previous left knee surgeries, OA, CLL, h/o right shoulder pain.    Examination-Activity Limitations Other;Transfers;Locomotion Level    Examination-Participation Restrictions Other    Stability/Clinical Decision Making Stable/Uncomplicated    Rehab Potential Excellent    PT Frequency 3x / week    PT Duration 4 weeks    PT Treatment/Interventions ADLs/Self Care Home Management;Cryotherapy;Electrical Stimulation;Gait training;Stair training;Functional mobility training;Therapeutic activities;Therapeutic exercise;Neuromuscular re-education;Manual techniques;Patient/family education;Passive range of motion;Vasopneumatic Device    PT Next Visit Plan cont with POC for ext ROM and strengthening DC next week per PT    Consulted and Agree with Plan of Care Patient           Patient will benefit from skilled therapeutic intervention in order to improve the following deficits and impairments:  Pain,Decreased range of  motion,Decreased strength,Increased edema  Visit Diagnosis: Chronic pain of right knee  Stiffness of right knee, not elsewhere classified  Localized edema  Muscle weakness (generalized)     Problem List Patient Active  Problem List   Diagnosis Date Noted  . 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 Right knee OA 10/09/2020  . CLL (chronic lymphocytic leukemia) ([place]) 06/13/2020  . Eustachian tube dysfunction 12/14/2015  . Hyperlipidemia 06/13/2015    Ladean Raya, PTA 12/20/20 2:31 PM  [facility] [place], [place], [address] Phone: [phone]   Fax:  [phone]  Name: Travis Wood MRN: [id number] Date of Birth: May 14, 1951

## 2020-12-25 ENCOUNTER — Ambulatory Visit: Payer: Medicare HMO | Admitting: Physical Therapy

## 2020-12-25 ENCOUNTER — Other Ambulatory Visit: Payer: Self-pay

## 2020-12-25 DIAGNOSIS — M25661 Stiffness of right knee, not elsewhere classified: Secondary | ICD-10-CM | POA: Diagnosis not present

## 2020-12-25 DIAGNOSIS — M6281 Muscle weakness (generalized): Secondary | ICD-10-CM | POA: Diagnosis not present

## 2020-12-25 DIAGNOSIS — G8929 Other chronic pain: Secondary | ICD-10-CM

## 2020-12-25 DIAGNOSIS — R600 Localized edema: Secondary | ICD-10-CM | POA: Diagnosis not present

## 2020-12-25 DIAGNOSIS — M25561 Pain in right knee: Secondary | ICD-10-CM | POA: Diagnosis not present

## 2020-12-25 NOTE — Therapy (Addendum)
[facility] [place], [place], [address] Phone: [phone]   Fax:  [phone]  Physical Therapy Treatment  Patient Details  Name: Travis Wood MRN: [id number] Date of Birth: Sep 29, 1951 Referring Provider (PT): Paralee Cancel MD   Encounter Date: 12/25/2020   PT End of Session - 12/25/20 1429     Visit Number 21    Number of Visits 22    Date for PT Re-Evaluation 12/28/20    Authorization Type FOTO 20th 15% PROGRESS NOTE AT 10TH VISIT.  KX MODIFIER AFTER 15 VISITS.    PT Start Time 45   Late arriveal.   PT Stop Time 0228    PT Time Calculation (min) 34 min    Activity Tolerance Patient tolerated treatment well    Behavior During Therapy [facility] for tasks assessed/performed             Past Medical History:  Diagnosis Date   Cataract    CLL (chronic lymphocytic leukemia) ([place])    Erectile dysfunction    History of bronchitis    History of colon polyps    Hyperlipemia    Osteoarthritis    Shortness of breath dyspnea     Past Surgical History:  Procedure Laterality Date   APPENDECTOMY     CATARACT EXTRACTION W/PHACO Left 10/15/2015   Procedure: CATARACT EXTRACTION PHACO AND INTRAOCULAR LENS PLACEMENT LEFT EYE;  Surgeon: Tonny Branch, MD;  Location: [facility];  Service: Ophthalmology;  Laterality: Left;  CDE:12.20   CATARACT EXTRACTION W/PHACO Right 11/12/2015   Procedure: CATARACT EXTRACTION PHACO AND INTRAOCULAR LENS PLACEMENT RIGHT EYE:  CDE:  8.99;  Surgeon: Tonny Branch, MD;  Location: [facility];  Service: Ophthalmology;  Laterality: Right;   KNEE SURGERY     Left x 2    MASS EXCISION N/A 09/12/2015   Procedure: EXCISION SOFT TISSUE NEOPLASM (6 CM), BACK;  Surgeon: Aviva Signs, MD;  Location: [facility];  Service: General;  Laterality: N/A;   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   Procedure: TOTAL KNEE ARTHROPLASTY;  Surgeon: Paralee Cancel, MD;  Location[facility];  Service: Orthopedics;  Laterality: Right;  70 mins     There were no vitals filed for this visit.   Subjective Assessment - 12/25/20 1359     Subjective COVID-19 screen performed prior to patient entering clinic.  Doing well.    Pertinent History Previous left knee surgeries, OA, CLL, h/o right shoulder pain.    Patient Stated Goals Get back to golfing.    Pain Orientation Right;Medial    Pain Descriptors / Indicators Discomfort    Pain Type Surgical pain    Pain Onset More than a month ago                [facility] PT Assessment - 12/25/20 0001       PROM   Right Knee Flexion 120                           [facility] Adult PT Treatment/Exercise - 12/25/20 0001       Exercises   Exercises Knee/Hip      Knee/Hip Exercises: Aerobic   Recumbent Bike Level 4 x 15 minutes.      Knee/Hip Exercises: Machines for Strengthening   Cybex Knee Extension 20# x 3 minutes.    Cybex Knee Flexion 30# x 3 minutes.    Cybex Leg Press 2.5 plates x 3 minutes.  Manual Therapy   Manual Therapy Passive ROM    Passive ROM Low load long duration PROM to patient's left knee x 8 minutes.                         PT Long Term Goals - 12/20/20 1352       PT LONG TERM GOAL #1   Title Independent with a HEP.    Baseline MET 12/12/20    Time 4    Period Weeks    Status Achieved      PT LONG TERM GOAL #2   Title Full active right knee extension in order to normalize gait.    Baseline AROM -8 degrees 12/20/20    Time 4    Period Weeks    Status On-going      PT LONG TERM GOAL #3   Title Active right knee flexion to 120 degrees+ so the patient can perform functional tasks and do so with pain not > 2-3/10.    Time 4    Period Weeks    Status Achieved      PT LONG TERM GOAL #4   Title Increase right hip and  knee strength to a solid 4+/5 to provide good stability for accomplishment of functional activities.    Time 4    Period Weeks    Status On-going      PT LONG TERM GOAL #5   Title Perform a  reciprocating stair gait with one railing with pain not > 2-3/10.    Time 4    Period Weeks    Status Achieved                   Plan - 12/25/20 1434     Clinical Impression Statement Excellent progress overall.  Patient very plesed with his progress.  Able to do a lot more at home with essentially no pain.    Personal Factors and Comorbidities Comorbidity 1;Comorbidity 2    Comorbidities Previous left knee surgeries, OA, CLL, h/o right shoulder pain.    Examination-Activity Limitations Other;Transfers;Locomotion Level    Examination-Participation Restrictions Other    Stability/Clinical Decision Making Stable/Uncomplicated    Rehab Potential Excellent    PT Frequency 3x / week    PT Duration 4 weeks    PT Treatment/Interventions ADLs/Self Care Home Management;Cryotherapy;Electrical Stimulation;Gait training;Stair training;Functional mobility training;Therapeutic activities;Therapeutic exercise;Neuromuscular re-education;Manual techniques;Patient/family education;Passive range of motion;Vasopneumatic Device    PT Next Visit Plan cont with POC for ext ROM and strengthening DC next week per PT    Consulted and Agree with Plan of Care Patient             Patient will benefit from skilled therapeutic intervention in order to improve the following deficits and impairments:  Pain,Decreased range of motion,Decreased strength,Increased edema  Visit Diagnosis: Chronic pain of right knee  Stiffness of right knee, not elsewhere classified     Problem List Patient Active Problem List   Diagnosis Date Noted   S/P right TKA 10/09/2020   Right knee OA 10/09/2020   CLL (chronic lymphocytic leukemia) ([place]) 06/13/2020   Eustachian tube dysfunction 12/14/2015   Hyperlipidemia 06/13/2015    Tashaun Obey, [place] MPT 12/25/2020, 2:38 PM  [facility] [address] [place], [place], [address] Phone: [phone]   Fax:  [phone]  Name:  Travis Wood MRN: [id number] Date of Birth: May 09, 1951  PHYSICAL THERAPY DISCHARGE SUMMARY  Visits from Start of Care:  21.  Current functional level related to goals / functional outcomes: See above.   Remaining deficits: See goal section.   Education / Equipment: HEP.   Patient agrees to discharge. Patient goals were partially met. Patient is being discharged due to being pleased with the current functional level.    [place] Delsie Amador MPT

## 2020-12-27 ENCOUNTER — Encounter: Payer: Medicare HMO | Admitting: Physical Therapy

## 2021-02-14 ENCOUNTER — Encounter: Payer: Self-pay | Admitting: Internal Medicine

## 2021-03-06 ENCOUNTER — Other Ambulatory Visit: Payer: Self-pay | Admitting: Family Medicine

## 2021-03-11 ENCOUNTER — Inpatient Hospital Stay (HOSPITAL_COMMUNITY): Payer: Medicare HMO | Attending: Hematology

## 2021-03-18 ENCOUNTER — Inpatient Hospital Stay (HOSPITAL_COMMUNITY): Payer: Medicare HMO | Admitting: Hematology

## 2021-03-27 ENCOUNTER — Ambulatory Visit (INDEPENDENT_AMBULATORY_CARE_PROVIDER_SITE_OTHER): Payer: Medicare HMO | Admitting: Family Medicine

## 2021-03-27 ENCOUNTER — Other Ambulatory Visit: Payer: Self-pay

## 2021-03-27 ENCOUNTER — Encounter: Payer: Self-pay | Admitting: Family Medicine

## 2021-03-27 VITALS — BP 107/67 | HR 64 | Temp 97.5°F | Ht >= 80 in | Wt 205.6 lb

## 2021-03-27 DIAGNOSIS — E782 Mixed hyperlipidemia: Secondary | ICD-10-CM | POA: Diagnosis not present

## 2021-03-27 DIAGNOSIS — E559 Vitamin D deficiency, unspecified: Secondary | ICD-10-CM | POA: Diagnosis not present

## 2021-03-27 DIAGNOSIS — D72829 Elevated white blood cell count, unspecified: Secondary | ICD-10-CM

## 2021-03-27 MED ORDER — VITAMIN D (ERGOCALCIFEROL) 1.25 MG (50000 UNIT) PO CAPS: 50000.0000 [IU] | ORAL_CAPSULE | ORAL | 3 refills | Status: DC

## 2021-03-27 MED ORDER — ATORVASTATIN CALCIUM 40 MG PO TABS: 40.0000 mg | ORAL_TABLET | Freq: Every day | ORAL | 1 refills | Status: DC

## 2021-03-27 NOTE — Progress Notes (Signed)
Subjective:  Patient ID: Travis Wood, male    DOB: December 03, 1950  Age: 70 y.o. MRN: [id number]  CC: Medication Refill   HPI Travis Wood presents for follow-up of elevated cholesterol. Doing well without complaints on current medication. Denies side effects of statin including myalgia and arthralgia and nausea. Also in today for liver function testing. Currently no chest pain, shortness of breath or other cardiovascular related symptoms noted.  He is also taking medicine for vitamin D.  He is very concerned about his immune system due to recent diagnosis of CLL.  He wants to maximize his immune system.  He is using olive leaf extract that as well as taking a multivitamin.  History Travis Wood has a past medical history of Cataract, CLL (chronic lymphocytic leukemia) ([place]), Erectile dysfunction, History of bronchitis, History of colon polyps, Hyperlipemia, Osteoarthritis, and Shortness of breath dyspnea.   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   His family history includes Cancer in his sister; Colon cancer in his father; Colon polyps in his father; Heart disease in his father, maternal grandmother, and mother; Hypertension in his brother; Obesity in his sister.He reports that he quit smoking about 29 years ago. His smoking use included cigarettes and pipe. He smoked 0.00 packs per day for 0.00 years. He has never used smokeless tobacco. He reports current alcohol use of about 1.0 standard drink of alcohol per week. He reports current drug use. Frequency: 1.00 time per week. Drug: Marijuana.  Current Outpatient Medications on File Prior to Visit  Medication Sig Dispense Refill  . Multiple Vitamin (MULTIVITAMIN WITH MINERALS) TABS tablet Take 1 tablet by mouth daily.    Marland Kitchen OLIVE LEAF EXTRACT  PO Take 1 tablet by mouth in the morning and at bedtime.     No current facility-administered medications on file prior to visit.    ROS Review of Systems  Constitutional: Negative for fever.  Respiratory: Negative for shortness of breath.   Cardiovascular: Negative for chest pain.  Musculoskeletal: Negative for arthralgias.  Skin: Negative for rash.    Objective:  BP 107/67   Pulse 64   Temp (!) 97.5 F (36.4 C)   Ht '6\' 9"'  (2.057 m)   Wt 205 lb 9.6 oz (93.3 kg)   SpO2 95%   BMI 22.03 kg/m   BP Readings from Last 3 Encounters:  03/27/21 107/67  10/29/20 (!) 101/46  10/10/20 132/74    Wt Readings from Last 3 Encounters:  03/27/21 205 lb 9.6 oz (93.3 kg)  10/29/20 223 lb 3.2 oz (101.2 kg)  10/09/20 201 lb (91.2 kg)     Physical Exam Vitals reviewed.  Constitutional:      Appearance: He is well-developed.  HENT:     Head: Normocephalic and atraumatic.     Right Ear: Tympanic membrane and external ear normal. No decreased hearing noted.     Left Ear: Tympanic membrane and external ear normal. No decreased hearing noted.     Mouth/Throat:     Pharynx: No oropharyngeal exudate or posterior oropharyngeal erythema.  Eyes:     Pupils: Pupils are equal, round, and reactive to light.  Cardiovascular:     Rate and Rhythm: Normal rate and regular rhythm.     Heart sounds: No murmur heard.   Pulmonary:     Effort: No respiratory distress.  Breath sounds: Normal breath sounds.  Abdominal:     General: Bowel sounds are normal.     Palpations: Abdomen is soft. There is no mass.     Tenderness: There is no abdominal tenderness.  Musculoskeletal:     Cervical back: Normal range of motion and neck supple.     No results found for: HGBA1C  Lab Results  Component Value Date   WBC 41.6 (H) 10/10/2020   HGB 10.4 (L) 10/10/2020   HCT 31.5 (L) 10/10/2020   PLT 133 (L) 10/10/2020   GLUCOSE 118 (H) 10/10/2020   CHOL 163 05/30/2020   TRIG 118 05/30/2020   HDL 36  (L) 05/30/2020   LDLCALC 106 (H) 05/30/2020   ALT 23 10/01/2020   AST 24 10/01/2020   NA 136 10/10/2020   K 4.2 10/10/2020   CL 106 10/10/2020   CREATININE 0.69 10/10/2020   BUN 18 10/10/2020   CO2 24 10/10/2020   TSH 1.750 05/30/2015   PSA 1.8 02/20/2015    No results found.  Assessment & Plan:   Travis Wood was seen today for medication refill.  Diagnoses and all orders for this visit:  Mixed hyperlipidemia -     CMP14+EGFR -     Lipid panel -     atorvastatin (LIPITOR) 40 MG tablet; Take 1 tablet (40 mg total) by mouth daily at 6 PM.  Vitamin D deficiency -     CMP14+EGFR -     VITAMIN D 25 Hydroxy (Vit-D Deficiency, Fractures) -     Vitamin D, Ergocalciferol, (DRISDOL) 1.25 MG (50000 UNIT) CAPS capsule; Take 1 capsule (50,000 Units total) by mouth every 7 (seven) days.  Leukocytosis, unspecified type -     CMP14+EGFR -     CBC with Differential/Platelet   I have discontinued Travis Wood's ferrous sulfate, docusate sodium, polyethylene glycol, methocarbamol, HYDROcodone-acetaminophen, and celecoxib. I am also having him maintain his multivitamin with minerals, OLIVE LEAF EXTRACT PO, Vitamin D (Ergocalciferol), and atorvastatin.  Meds ordered this encounter  Medications  . Vitamin D, Ergocalciferol, (DRISDOL) 1.25 MG (50000 UNIT) CAPS capsule    Sig: Take 1 capsule (50,000 Units total) by mouth every 7 (seven) days.    Dispense:  13 capsule    Refill:  3  . atorvastatin (LIPITOR) 40 MG tablet    Sig: Take 1 tablet (40 mg total) by mouth daily at 6 PM.    Dispense:  90 tablet    Refill:  1     Follow-up: Return in about 6 months (around 09/26/2021).  Claretta Fraise, M.D.

## 2021-03-28 LAB — LIPID PANEL
Chol/HDL Ratio: 4.8 ratio (ref 0.0–5.0)
Cholesterol, Total: 145 mg/dL (ref 100–199)
HDL: 30 mg/dL — ABNORMAL LOW (ref >39)
LDL Chol Calc (NIH): 93 mg/dL (ref 0–99)
Triglycerides: 119 mg/dL (ref 0–149)
VLDL Cholesterol Cal: 22 mg/dL (ref 5–40)

## 2021-03-28 LAB — CBC WITH DIFFERENTIAL/PLATELET
Basophils Absolute: 0.1 10*3/uL (ref 0.0–0.2)
Basos: 0 %
EOS (ABSOLUTE): 0.1 10*3/uL (ref 0.0–0.4)
Eos: 0 %
Hematocrit: 38.8 % (ref 37.5–51.0)
Hemoglobin: 13.1 g/dL (ref 13.0–17.7)
Immature Grans (Abs): 0.1 10*3/uL (ref 0.0–0.1)
Immature Granulocytes: 1 %
Lymphocytes Absolute: 16.5 10*3/uL — ABNORMAL HIGH (ref 0.7–3.1)
Lymphs: 66 %
MCH: 29.8 pg (ref 26.6–33.0)
MCHC: 33.8 g/dL (ref 31.5–35.7)
MCV: 88 fL (ref 79–97)
Monocytes Absolute: 2.4 10*3/uL — ABNORMAL HIGH (ref 0.1–0.9)
Monocytes: 9 %
Neutrophils Absolute: 6 10*3/uL (ref 1.4–7.0)
Neutrophils: 24 %
Platelets: 119 10*3/uL — ABNORMAL LOW (ref 150–450)
RBC: 4.39 x10E6/uL (ref 4.14–5.80)
RDW: 15 % (ref 11.6–15.4)
WBC: 25.2 10*3/uL (ref 3.4–10.8)

## 2021-03-28 LAB — CMP14+EGFR
ALT: 13 IU/L (ref 0–44)
AST: 18 IU/L (ref 0–40)
Albumin/Globulin Ratio: 2.8 — ABNORMAL HIGH (ref 1.2–2.2)
Albumin: 4.5 g/dL (ref 3.8–4.8)
Alkaline Phosphatase: 122 IU/L — ABNORMAL HIGH (ref 44–121)
BUN/Creatinine Ratio: 16 (ref 10–24)
BUN: 15 mg/dL (ref 8–27)
Bilirubin Total: 0.6 mg/dL (ref 0.0–1.2)
CO2: 26 mmol/L (ref 20–29)
Calcium: 9.1 mg/dL (ref 8.6–10.2)
Chloride: 103 mmol/L (ref 96–106)
Creatinine, Ser: 0.95 mg/dL (ref 0.76–1.27)
Globulin, Total: 1.6 g/dL (ref 1.5–4.5)
Glucose: 93 mg/dL (ref 65–99)
Potassium: 5.3 mmol/L — ABNORMAL HIGH (ref 3.5–5.2)
Sodium: 143 mmol/L (ref 134–144)
Total Protein: 6.1 g/dL (ref 6.0–8.5)
eGFR: 87 mL/min/{1.73_m2} (ref >59)

## 2021-03-28 LAB — VITAMIN D 25 HYDROXY (VIT D DEFICIENCY, FRACTURES): Vit D, 25-Hydroxy: 54.8 ng/mL (ref 30.0–100.0)

## 2021-04-19 ENCOUNTER — Other Ambulatory Visit: Payer: Self-pay | Admitting: Family Medicine

## 2021-04-19 DIAGNOSIS — E782 Mixed hyperlipidemia: Secondary | ICD-10-CM

## 2021-08-21 ENCOUNTER — Ambulatory Visit (INDEPENDENT_AMBULATORY_CARE_PROVIDER_SITE_OTHER): Payer: Medicare HMO | Admitting: Family Medicine

## 2021-08-21 ENCOUNTER — Other Ambulatory Visit: Payer: Self-pay

## 2021-08-21 ENCOUNTER — Encounter: Payer: Self-pay | Admitting: Family Medicine

## 2021-08-21 VITALS — BP 107/61 | HR 90 | Temp 98.0°F | Ht 75.0 in | Wt 197.0 lb

## 2021-08-21 DIAGNOSIS — C9110 Chronic lymphocytic leukemia of B-cell type not having achieved remission: Secondary | ICD-10-CM

## 2021-08-21 DIAGNOSIS — F320 Major depressive disorder, single episode, mild: Secondary | ICD-10-CM

## 2021-08-21 DIAGNOSIS — R69 Illness, unspecified: Secondary | ICD-10-CM | POA: Diagnosis not present

## 2021-08-21 DIAGNOSIS — R6889 Other general symptoms and signs: Secondary | ICD-10-CM | POA: Diagnosis not present

## 2021-08-21 DIAGNOSIS — R5383 Other fatigue: Secondary | ICD-10-CM | POA: Diagnosis not present

## 2021-08-21 NOTE — Patient Instructions (Signed)

## 2021-08-21 NOTE — Progress Notes (Signed)
Established Patient Office Visit  Subjective:  Patient ID: Travis Wood, male    DOB: June 16, 1951  Age: 70 y.o. MRN: [id number]  CC:  Chief Complaint  Patient presents with   Fatigue    HPI Travis Wood presents for fatigue. He reports fatigue for about 6 weeks. He reports getting sick 6 weeks ago with congestion and body aches. He was not evaluated for this. The fatigue has been present since then. It has improved overall. He reports sleeping well but he wakes up tired. The fatigue progresses throughout the day. He does report a decreased appetite as well. He also reports some brain fog. He feels like he can't think as clearly as he used to. This has been going on for 6 weeks as well. He has had diarrhea on and off for the last 6 weeks too. This will last for a few days at a time then he will have regular bowel movements in between. He feels short of breath with activity at times that is intermittent. He denies blood in stool, vomiting, fever, abdominal pain, night sweats, or recurrent infections. Denies chest pain, orthopnea, or edema. He is trying to stay active and continue golfing but it feels like work to do so. He has lost 7 lbs over the last 6 months.   He has a history of CLL. He has been evaluated by a oncologist before. He has not been seen in over a year. The plan was to watch and wait with follow up every 6 months or so. He would like a second opinion on this. He would like to see at oncologist with [facility] in [place].   Depression screen [facility] 2/9 08/21/2021 05/30/2020 08/04/2019  Decreased Interest 2 0 0  Down, Depressed, Hopeless 0 0 0  PHQ - 2 Score 2 0 0  Altered sleeping 0 - -  Tired, decreased energy 3 - -  Change in appetite 2 - -  Feeling bad or failure about yourself  0 - -  Trouble concentrating 1 - -  Moving slowly or fidgety/restless 0 - -  Suicidal thoughts 0 - -  PHQ-9 Score 8 - -   GAD 7 : Generalized Anxiety Score 08/21/2021  Nervous, Anxious, on Edge 0   Control/stop worrying 0  Worry too much - different things 0  Trouble relaxing 0  Restless 0  Easily annoyed or irritable 3  Afraid - awful might happen 0  Total GAD 7 Score 3      Past Medical History:  Diagnosis Date   Cataract    CLL (chronic lymphocytic leukemia) ([facility])    Erectile dysfunction    History of bronchitis    History of colon polyps    Hyperlipemia    Osteoarthritis    Shortness of breath dyspnea     Past Surgical History:  Procedure Laterality Date   APPENDECTOMY     CATARACT EXTRACTION W/PHACO Left 10/15/2015   Procedure: CATARACT EXTRACTION PHACO AND INTRAOCULAR LENS PLACEMENT LEFT EYE;  Surgeon: Tonny Branch, MD;  Location: [facility];  Service: Ophthalmology;  Laterality: Left;  CDE:12.20   CATARACT EXTRACTION W/PHACO Right 11/12/2015   Procedure: CATARACT EXTRACTION PHACO AND INTRAOCULAR LENS PLACEMENT RIGHT EYE:  CDE:  8.99;  Surgeon: Tonny Branch, MD;  Location: [facility];  Service: Ophthalmology;  Laterality: Right;   KNEE SURGERY     Left x 2    MASS EXCISION N/A 09/12/2015   Procedure: EXCISION SOFT TISSUE NEOPLASM (6 CM), BACK;  Surgeon: Aviva Signs, MD;  Location: [facility];  Service: General;  Laterality: N/A;   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   Procedure: TOTAL KNEE ARTHROPLASTY;  Surgeon: Paralee Cancel, MD;  Location: [facility];  Service: Orthopedics;  Laterality: Right;  70 mins    Family History  Problem Relation Age of Onset   Colon cancer Father        age 65    Colon polyps Father    Heart disease Father        Heart failure   Heart disease Mother    Cancer Sister        Uterine, and ovarian   Hypertension Brother    Obesity Sister    Heart disease Maternal Grandmother    Esophageal cancer Neg Hx    Liver cancer Neg Hx    Pancreatic cancer Neg Hx    Rectal cancer Neg Hx    Stomach cancer Neg Hx     Social History   Socioeconomic History   Marital status: Single    Spouse name: Not on file   Number of children: 2   Years of  education: Not on file   Highest education level: Not on file  Occupational History   Occupation: RETIRED  Tobacco Use   Smoking status: Former    Packs/day: 0.00    Years: 0.00    Pack years: 0.00    Types: Cigarettes, Pipe    Quit date: 09/10/1991    Years since quitting: 29.9   Smokeless tobacco: Never  Vaping Use   Vaping Use: Never used  Substance and Sexual Activity   Alcohol use: Yes    Alcohol/week: 1.0 standard drink    Types: 1 Cans of beer per week    Comment: occasional   Drug use: Yes    Frequency: 1.0 times per week    Types: Marijuana   Sexual activity: Yes  Other Topics Concern   Not on file  Social History Narrative   0 caffeine drinks daily    Social Determinants of Health   Financial Resource Strain: Low Risk    Difficulty of Paying Living Expenses: Not hard at all  Food Insecurity: No Food Insecurity   Worried About Charity fundraiser in the Last Year: Never true   Arboriculturist in the Last Year: Never true  Transportation Needs: No Transportation Needs   Lack of Transportation (Medical): No   Lack of Transportation (Non-Medical): No  Physical Activity: Sufficiently Active   Days of Exercise per Week: 7 days   Minutes of Exercise per Session: 30 min  Stress: No Stress Concern Present   Feeling of Stress : Not at all  Social Connections: Moderately Integrated   Frequency of Communication with Friends and Family: More than three times a week   Frequency of Social Gatherings with Friends and Family: More than three times a week   Attends Religious Services: 1 to 4 times per year   Active Member of [employer] or Organizations: No   Attends Archivist Meetings: Never   Marital Status: Living with partner  Intimate Partner Violence: Not At Risk   Fear of Current or Ex-Partner: No   Emotionally Abused: No   Physically Abused: No   Sexually Abused: No    Outpatient Medications Prior to Visit  Medication Sig Dispense Refill   atorvastatin  (LIPITOR) 40 MG tablet TAKE 1 TABLET (40 MG TOTAL) BY MOUTH DAILY AT 6 PM. 90 tablet 0  OLIVE LEAF EXTRACT PO Take 1 tablet by mouth in the morning and at bedtime.     Vitamin D, Ergocalciferol, (DRISDOL) 1.25 MG (50000 UNIT) CAPS capsule Take 1 capsule (50,000 Units total) by mouth every 7 (seven) days. 13 capsule 3   Multiple Vitamin (MULTIVITAMIN WITH MINERALS) TABS tablet Take 1 tablet by mouth daily. (Patient not taking: Reported on 08/21/2021)     No facility-administered medications prior to visit.    No Known Allergies  ROS Review of Systems As per HPI.    Objective:    Physical Exam Vitals and nursing note reviewed.  Constitutional:      General: He is not in acute distress.    Appearance: He is not ill-appearing, toxic-appearing or diaphoretic.  HENT:     Head: Normocephalic and atraumatic.  Cardiovascular:     Rate and Rhythm: Normal rate and regular rhythm.     Heart sounds: Normal heart sounds. No murmur heard. Pulmonary:     Effort: Pulmonary effort is normal. No respiratory distress.     Breath sounds: Normal breath sounds. No stridor. No wheezing, rhonchi or rales.  Chest:     Chest wall: No tenderness.  Abdominal:     General: Bowel sounds are normal. There is no distension.     Palpations: Abdomen is soft.     Tenderness: There is no abdominal tenderness. There is no guarding or rebound.  Musculoskeletal:     Cervical back: Neck supple. No tenderness.     Right lower leg: No edema.     Left lower leg: No edema.  Skin:    General: Skin is warm and dry.  Neurological:     General: No focal deficit present.     Mental Status: He is alert and oriented to person, place, and time.     Motor: No weakness.     Gait: Gait normal.  Psychiatric:        Mood and Affect: Mood normal.        Behavior: Behavior normal.        Thought Content: Thought content normal.        Judgment: Judgment normal.    BP 107/61   Pulse 90   Temp 98 F (36.7 C) (Temporal)    Ht '6\' 3"'  (1.905 m)   Wt 197 lb (89.4 kg)   SpO2 100%   BMI 24.62 kg/m  Wt Readings from Last 3 Encounters:  08/21/21 197 lb (89.4 kg)  03/27/21 205 lb 9.6 oz (93.3 kg)  10/29/20 223 lb 3.2 oz (101.2 kg)     Health Maintenance Due  Topic Date Due   Zoster Vaccines- Shingrix (1 of 2) Never done   COLONOSCOPY (Pts 45-69yr Insurance coverage will need to be confirmed)  02/22/2021   TETANUS/TDAP  04/20/2021    There are no preventive care reminders to display for this patient.  Lab Results  Component Value Date   TSH 1.750 05/30/2015   Lab Results  Component Value Date   WBC 25.2 (HH) 03/27/2021   HGB 13.1 03/27/2021   HCT 38.8 03/27/2021   MCV 88 03/27/2021   PLT 119 (L) 03/27/2021   Lab Results  Component Value Date   NA 143 03/27/2021   K 5.3 (H) 03/27/2021   CO2 26 03/27/2021   GLUCOSE 93 03/27/2021   BUN 15 03/27/2021   CREATININE 0.95 03/27/2021   BILITOT 0.6 03/27/2021   ALKPHOS 122 (H) 03/27/2021   AST 18 03/27/2021   ALT 13  03/27/2021   PROT 6.1 03/27/2021   ALBUMIN 4.5 03/27/2021   CALCIUM 9.1 03/27/2021   ANIONGAP 6 10/10/2020   EGFR 87 03/27/2021   Lab Results  Component Value Date   CHOL 145 03/27/2021   Lab Results  Component Value Date   HDL 30 (L) 03/27/2021   Lab Results  Component Value Date   LDLCALC 93 03/27/2021   Lab Results  Component Value Date   TRIG 119 03/27/2021   Lab Results  Component Value Date   CHOLHDL 4.8 03/27/2021   No results found for: HGBA1C    Assessment & Plan:   Danile was seen today for fatigue.  Diagnoses and all orders for this visit:  Fatigue, unspecified type For 6 weeks after illness. Discussed possible of post-Covid fatigue since he is also reporting brain fog post illness. Will check labs as below. Also recommended follow up with oncology for history of CLL with new fatigue. Will notify patient of results.  -     CMP14+EGFR -     Thyroid Panel With TSH -     Vitamin B12 -     Anemia  Profile B -     VITAMIN D 25 Hydroxy (Vit-D Deficiency, Fractures)  Depression, major, single episode, mild ([place]) Discussed screening results today and possibility of depression contributing to fatigue. He declined treatment today.   CLL (chronic lymphocytic leukemia) ([place]) Labs pending. New referral placed for second opinion per patient request.  -     CMP14+EGFR -     Anemia Profile B -     Ambulatory referral to Hematology / Oncology  Follow-up: Return if symptoms worsen or fail to improve. Keep schedule follow up with PCP next month.    The patient indicates understanding of these issues and agrees with the plan.  Gwenlyn Perking, FNP

## 2021-08-22 LAB — ANEMIA PROFILE B
Basophils Absolute: 0.1 10*3/uL (ref 0.0–0.2)
Basos: 0 %
EOS (ABSOLUTE): 0 10*3/uL (ref 0.0–0.4)
Eos: 0 %
Ferritin: 421 ng/mL — ABNORMAL HIGH (ref 30–400)
Folate: 17.6 ng/mL (ref >3.0)
Hematocrit: 28.8 % — ABNORMAL LOW (ref 37.5–51.0)
Hemoglobin: 9.5 g/dL — ABNORMAL LOW (ref 13.0–17.7)
Immature Grans (Abs): 0.3 10*3/uL — ABNORMAL HIGH (ref 0.0–0.1)
Immature Granulocytes: 1 %
Iron Saturation: 20 % (ref 15–55)
Iron: 62 ug/dL (ref 38–169)
Lymphocytes Absolute: 19.5 10*3/uL — ABNORMAL HIGH (ref 0.7–3.1)
Lymphs: 83 %
MCH: 29.3 pg (ref 26.6–33.0)
MCHC: 33 g/dL (ref 31.5–35.7)
MCV: 89 fL (ref 79–97)
Monocytes Absolute: 1.6 10*3/uL — ABNORMAL HIGH (ref 0.1–0.9)
Monocytes: 7 %
Neutrophils Absolute: 2 10*3/uL (ref 1.4–7.0)
Neutrophils: 9 %
Platelets: 89 10*3/uL — CL (ref 150–450)
RBC: 3.24 x10E6/uL — ABNORMAL LOW (ref 4.14–5.80)
RDW: 13.8 % (ref 11.6–15.4)
Retic Ct Pct: 2 % (ref 0.6–2.6)
Total Iron Binding Capacity: 310 ug/dL (ref 250–450)
UIBC: 248 ug/dL (ref 111–343)
Vitamin B-12: 231 pg/mL — ABNORMAL LOW (ref 232–1245)
WBC: 23.5 10*3/uL (ref 3.4–10.8)

## 2021-08-22 LAB — CMP14+EGFR
ALT: 29 IU/L (ref 0–44)
AST: 31 IU/L (ref 0–40)
Albumin/Globulin Ratio: 2.4 — ABNORMAL HIGH (ref 1.2–2.2)
Albumin: 3.8 g/dL (ref 3.8–4.8)
Alkaline Phosphatase: 142 IU/L — ABNORMAL HIGH (ref 44–121)
BUN/Creatinine Ratio: 18 (ref 10–24)
BUN: 15 mg/dL (ref 8–27)
Bilirubin Total: 0.9 mg/dL (ref 0.0–1.2)
CO2: 25 mmol/L (ref 20–29)
Calcium: 8.4 mg/dL — ABNORMAL LOW (ref 8.6–10.2)
Chloride: 103 mmol/L (ref 96–106)
Creatinine, Ser: 0.85 mg/dL (ref 0.76–1.27)
Globulin, Total: 1.6 g/dL (ref 1.5–4.5)
Glucose: 105 mg/dL — ABNORMAL HIGH (ref 65–99)
Potassium: 4.6 mmol/L (ref 3.5–5.2)
Sodium: 138 mmol/L (ref 134–144)
Total Protein: 5.4 g/dL — ABNORMAL LOW (ref 6.0–8.5)
eGFR: 93 mL/min/{1.73_m2} (ref >59)

## 2021-08-22 LAB — THYROID PANEL WITH TSH
Free Thyroxine Index: 3 (ref 1.2–4.9)
T3 Uptake Ratio: 29 % (ref 24–39)
T4, Total: 10.4 ug/dL (ref 4.5–12.0)
TSH: 2.27 u[IU]/mL (ref 0.450–4.500)

## 2021-08-22 LAB — VITAMIN D 25 HYDROXY (VIT D DEFICIENCY, FRACTURES): Vit D, 25-Hydroxy: 65.4 ng/mL (ref 30.0–100.0)

## 2021-09-05 ENCOUNTER — Other Ambulatory Visit: Payer: Self-pay | Admitting: Family Medicine

## 2021-09-05 DIAGNOSIS — E782 Mixed hyperlipidemia: Secondary | ICD-10-CM

## 2021-09-20 ENCOUNTER — Ambulatory Visit (INDEPENDENT_AMBULATORY_CARE_PROVIDER_SITE_OTHER): Payer: Medicare HMO

## 2021-09-20 VITALS — Ht 75.0 in | Wt 197.0 lb

## 2021-09-20 DIAGNOSIS — Z1211 Encounter for screening for malignant neoplasm of colon: Secondary | ICD-10-CM

## 2021-09-20 DIAGNOSIS — Z86010 Personal history of colonic polyps: Secondary | ICD-10-CM | POA: Diagnosis not present

## 2021-09-20 DIAGNOSIS — Z0000 Encounter for general adult medical examination without abnormal findings: Secondary | ICD-10-CM

## 2021-09-20 NOTE — Patient Instructions (Signed)
[name] , Thank you for taking time to come for your Medicare Wellness Visit. I appreciate your ongoing commitment to your health goals. Please review the following plan we discussed and let me know if I can assist you in the future.   Screening recommendations/referrals: Colonoscopy: Done 02/22/2018 - repeat every 3 years - sent order to Dr Henrene Pastor Recommended yearly ophthalmology/optometry visit for glaucoma screening and checkup Recommended yearly dental visit for hygiene and checkup  Vaccinations: Influenza vaccine: Done 10/10/2020 - Repeat annually Pneumococcal vaccine: Done 05/31/2018 & 10/10/2020 Tdap vaccine: Done 04/21/2011 - Repeat in 10 years *due Shingles vaccine: Due.   Covid-19: Declined  Advanced directives: Please bring a copy of your health care power of attorney and living will to the office to be added to your chart at your convenience.   Conditions/risks identified: Aim for 30 minutes of exercise or brisk walking each day, drink 6-8 glasses of water and eat lots of fruits and vegetables.   Next appointment: Follow up in one year for your annual wellness visit.   Preventive Care 63 Years and Older, Male  Preventive care refers to lifestyle choices and visits with your health care provider that can promote health and wellness. What does preventive care include? A yearly physical exam. This is also called an annual well check. Dental exams once or twice a year. Routine eye exams. Ask your health care provider how often you should have your eyes checked. Personal lifestyle choices, including: Daily care of your teeth and gums. Regular physical activity. Eating a healthy diet. Avoiding tobacco and drug use. Limiting alcohol use. Practicing safe sex. Taking low doses of aspirin every day. Taking vitamin and mineral supplements as recommended by your health care provider. What happens during an annual well check? The services and screenings done by your health care  provider during your annual well check will depend on your age, overall health, lifestyle risk factors, and family history of disease. Counseling  Your health care provider may ask you questions about your: Alcohol use. Tobacco use. Drug use. Emotional well-being. Home and relationship well-being. Sexual activity. Eating habits. History of falls. Memory and ability to understand (cognition). Work and work Statistician. Screening  You may have the following tests or measurements: Height, weight, and BMI. Blood pressure. Lipid and cholesterol levels. These may be checked every 5 years, or more frequently if you are over 70 years old. Skin check. Lung cancer screening. You may have this screening every year starting at age 52 if you have a 30-pack-year history of smoking and currently smoke or have quit within the past 15 years. Fecal occult blood test (FOBT) of the stool. You may have this test every year starting at age 23. Flexible sigmoidoscopy or colonoscopy. You may have a sigmoidoscopy every 5 years or a colonoscopy every 10 years starting at age 75. Prostate cancer screening. Recommendations will vary depending on your family history and other risks. Hepatitis C blood test. Hepatitis B blood test. Sexually transmitted disease (STD) testing. Diabetes screening. This is done by checking your blood sugar (glucose) after you have not eaten for a while (fasting). You may have this done every 1-3 years. Abdominal aortic aneurysm (AAA) screening. You may need this if you are a current or former smoker. Osteoporosis. You may be screened starting at age 22 if you are at high risk. Talk with your health care provider about your test results, treatment options, and if necessary, the need for more tests. Vaccines  Your health  care provider may recommend certain vaccines, such as: Influenza vaccine. This is recommended every year. Tetanus, diphtheria, and acellular pertussis (Tdap, Td)  vaccine. You may need a Td booster every 10 years. Zoster vaccine. You may need this after age 47. Pneumococcal 13-valent conjugate (PCV13) vaccine. One dose is recommended after age 34. Pneumococcal polysaccharide (PPSV23) vaccine. One dose is recommended after age 45. Talk to your health care provider about which screenings and vaccines you need and how often you need them. This information is not intended to replace advice given to you by your health care provider. Make sure you discuss any questions you have with your health care provider. Document Released: 12/14/2015 Document Revised: 08/06/2016 Document Reviewed: 09/18/2015 Elsevier Interactive Patient Education  2017 [place] Prevention in the Home Falls can cause injuries. They can happen to people of all ages. There are many things you can do to make your home safe and to help prevent falls. What can I do on the outside of my home? Regularly fix the edges of walkways and driveways and fix any cracks. Remove anything that might make you trip as you walk through a door, such as a raised step or threshold. Trim any bushes or trees on the path to your home. Use bright outdoor lighting. Clear any walking paths of anything that might make someone trip, such as rocks or tools. Regularly check to see if handrails are loose or broken. Make sure that both sides of any steps have handrails. Any raised decks and porches should have guardrails on the edges. Have any leaves, snow, or ice cleared regularly. Use sand or salt on walking paths during winter. Clean up any spills in your garage right away. This includes oil or grease spills. What can I do in the bathroom? Use night lights. Install grab bars by the toilet and in the tub and shower. Do not use towel bars as grab bars. Use non-skid mats or decals in the tub or shower. If you need to sit down in the shower, use a plastic, non-slip stool. Keep the floor dry. Clean up any  water that spills on the floor as soon as it happens. Remove soap buildup in the tub or shower regularly. Attach bath mats securely with double-sided non-slip rug tape. Do not have throw rugs and other things on the floor that can make you trip. What can I do in the bedroom? Use night lights. Make sure that you have a light by your bed that is easy to reach. Do not use any sheets or blankets that are too big for your bed. They should not hang down onto the floor. Have a firm chair that has side arms. You can use this for support while you get dressed. Do not have throw rugs and other things on the floor that can make you trip. What can I do in the kitchen? Clean up any spills right away. Avoid walking on wet floors. Keep items that you use a lot in easy-to-reach places. If you need to reach something above you, use a strong step stool that has a grab bar. Keep electrical cords out of the way. Do not use floor polish or wax that makes floors slippery. If you must use wax, use non-skid floor wax. Do not have throw rugs and other things on the floor that can make you trip. What can I do with my stairs? Do not leave any items on the stairs. Make sure that there are handrails on  both sides of the stairs and use them. Fix handrails that are broken or loose. Make sure that handrails are as long as the stairways. Check any carpeting to make sure that it is firmly attached to the stairs. Fix any carpet that is loose or worn. Avoid having throw rugs at the top or bottom of the stairs. If you do have throw rugs, attach them to the floor with carpet tape. Make sure that you have a light switch at the top of the stairs and the bottom of the stairs. If you do not have them, ask someone to add them for you. What else can I do to help prevent falls? Wear shoes that: Do not have high heels. Have rubber bottoms. Are comfortable and fit you well. Are closed at the toe. Do not wear sandals. If you use a  stepladder: Make sure that it is fully opened. Do not climb a closed stepladder. Make sure that both sides of the stepladder are locked into place. Ask someone to hold it for you, if possible. Clearly mark and make sure that you can see: Any grab bars or handrails. First and last steps. Where the edge of each step is. Use tools that help you move around (mobility aids) if they are needed. These include: Canes. Walkers. Scooters. Crutches. Turn on the lights when you go into a dark area. Replace any light bulbs as soon as they burn out. Set up your furniture so you have a clear path. Avoid moving your furniture around. If any of your floors are uneven, fix them. If there are any pets around you, be aware of where they are. Review your medicines with your doctor. Some medicines can make you feel dizzy. This can increase your chance of falling. Ask your doctor what other things that you can do to help prevent falls. This information is not intended to replace advice given to you by your health care provider. Make sure you discuss any questions you have with your health care provider. Document Released: 09/13/2009 Document Revised: 04/24/2016 Document Reviewed: 12/22/2014 Elsevier Interactive Patient Education  2017 [employer].

## 2021-09-20 NOTE — Progress Notes (Signed)
Subjective:   Travis Wood is a 70 y.o. male who presents for Medicare Annual/Subsequent preventive examination.  Virtual Visit via Telephone Note  I connected with  Travis Wood on 09/20/21 at  1:15 PM EDT by telephone and verified that I am speaking with the correct person using two identifiers.  Location: Patient: Home Provider: [facility] Persons participating in the virtual visit: patient/Nurse Health Advisor   I discussed the limitations, risks, security and privacy concerns of performing an evaluation and management service by telephone and the availability of in person appointments. The patient expressed understanding and agreed to proceed.  Interactive audio and video telecommunications were attempted between this nurse and patient, however failed, due to patient having technical difficulties OR patient did not have access to video capability.  We continued and completed visit with audio only.  Some vital signs may be absent or patient reported.   Shaughn Thomley E Eldra Word, LPN   Review of Systems     Cardiac Risk Factors include: advanced age (>67men, >66 women);dyslipidemia;Other (see comment);male gender, Risk factor comments: hx chronic lymphocytic leukemia     Objective:    Today's Vitals   09/20/21 1354  Weight: 197 lb (89.4 kg)  Height: 6\' 3"  (1.905 m)   Body mass index is 24.62 kg/m.  Advanced Directives 09/20/2021 10/29/2020 10/12/2020 10/09/2020 10/05/2020 06/13/2020 06/12/2020  Does Patient Have a Medical Advance Directive? No No No No No No No  Would patient like information on creating a medical advance directive? No - Patient declined Yes (MAU/Ambulatory/Procedural Areas - Information given) - No - Patient declined - No - Patient declined No - Patient declined    Current Medications (verified) Outpatient Encounter Medications as of 09/20/2021  Medication Sig   atorvastatin (LIPITOR) 40 MG tablet TAKE 1 TABLET BY MOUTH DAILY AT 6 PM.   ferrous sulfate 325 (65 FE) MG  tablet Take 325 mg by mouth daily with breakfast.   Multiple Vitamin (MULTIVITAMIN WITH MINERALS) TABS tablet Take 1 tablet by mouth daily.   OLIVE LEAF EXTRACT PO Take 1 tablet by mouth in the morning and at bedtime.   Vitamin D, Ergocalciferol, (DRISDOL) 1.25 MG (50000 UNIT) CAPS capsule Take 1 capsule (50,000 Units total) by mouth every 7 (seven) days.   No facility-administered encounter medications on file as of 09/20/2021.    Allergies (verified) Patient has no known allergies.   History: Past Medical History:  Diagnosis Date   Cataract    CLL (chronic lymphocytic leukemia) (HCC)    Erectile dysfunction    History of bronchitis    History of colon polyps    Hyperlipemia    Osteoarthritis    Shortness of breath dyspnea    Past Surgical History:  Procedure Laterality Date   APPENDECTOMY     CATARACT EXTRACTION W/PHACO Left 10/15/2015   Procedure: CATARACT EXTRACTION PHACO AND INTRAOCULAR LENS PLACEMENT LEFT EYE;  Surgeon: Tonny Branch, MD;  Location: [facility];  Service: Ophthalmology;  Laterality: Left;  CDE:12.20   CATARACT EXTRACTION W/PHACO Right 11/12/2015   Procedure: CATARACT EXTRACTION PHACO AND INTRAOCULAR LENS PLACEMENT RIGHT EYE:  CDE:  8.99;  Surgeon: Tonny Branch, MD;  Location: [facility];  Service: Ophthalmology;  Laterality: Right;   KNEE SURGERY     Left x 2    MASS EXCISION N/A 09/12/2015   Procedure: EXCISION SOFT TISSUE NEOPLASM (6 CM), BACK;  Surgeon: Aviva Signs, MD;  Location: [facility];  Service: General;  Laterality: N/A;   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  Procedure: TOTAL KNEE ARTHROPLASTY;  Surgeon: Paralee Cancel, MD;  Location: [facility];  Service: Orthopedics;  Laterality: Right;  70 mins   Family History  Problem Relation Age of Onset   Colon cancer Father        age 63    Colon polyps Father    Heart disease Father        Heart failure   Heart disease Mother    Cancer Sister        Uterine, and ovarian   Hypertension Brother    Obesity  Sister    Heart disease Maternal Grandmother    Esophageal cancer Neg Hx    Liver cancer Neg Hx    Pancreatic cancer Neg Hx    Rectal cancer Neg Hx    Stomach cancer Neg Hx    Social History   Socioeconomic History   Marital status: Single    Spouse name: Not on file   Number of children: 2   Years of education: Not on file   Highest education level: Not on file  Occupational History   Occupation: RETIRED  Tobacco Use   Smoking status: Former    Packs/day: 0.00    Years: 0.00    Pack years: 0.00    Types: Cigarettes, Pipe    Quit date: 09/10/1991    Years since quitting: 30.0   Smokeless tobacco: Never  Vaping Use   Vaping Use: Never used  Substance and Sexual Activity   Alcohol use: Yes    Alcohol/week: 1.0 standard drink    Types: 1 Cans of beer per week    Comment: occasional   Drug use: Yes    Frequency: 1.0 times per week    Types: Marijuana   Sexual activity: Yes  Other Topics Concern   Not on file  Social History Narrative   0 caffeine drinks daily    Social Determinants of Health   Financial Resource Strain: Low Risk    Difficulty of Paying Living Expenses: Not hard at all  Food Insecurity: No Food Insecurity   Worried About Charity fundraiser in the Last Year: Never true   Arboriculturist in the Last Year: Never true  Transportation Needs: No Transportation Needs   Lack of Transportation (Medical): No   Lack of Transportation (Non-Medical): No  Physical Activity: Sufficiently Active   Days of Exercise per Week: 7 days   Minutes of Exercise per Session: 30 min  Stress: No Stress Concern Present   Feeling of Stress : Not at all  Social Connections: Socially Integrated   Frequency of Communication with Friends and Family: More than three times a week   Frequency of Social Gatherings with Friends and Family: More than three times a week   Attends Religious Services: 1 to 4 times per year   Active Member of [employer] or Organizations: Yes   Attends Arts development officer: More than 4 times per year   Marital Status: Living with partner    Tobacco Counseling Counseling given: Not Answered   Clinical Intake:  Pre-visit preparation completed: Yes  Pain : No/denies pain     BMI - recorded: 24.62 Nutritional Status: BMI of 19-24  Normal Nutritional Risks: None Diabetes: No  How often do you need to have someone help you when you read instructions, pamphlets, or other written materials from your doctor or pharmacy?: 1 - Never  Diabetic?no  Interpreter Needed?: No  Information entered by :: Adalberto Cole, LPN  Activities of Daily Living In your present state of health, do you have any difficulty performing the following activities: 09/20/2021 10/09/2020  Hearing? N N  Vision? N N  Difficulty concentrating or making decisions? N N  Walking or climbing stairs? N N  Dressing or bathing? N N  Doing errands, shopping? N N  Preparing Food and eating ? N -  Using the Toilet? N -  In the past six months, have you accidently leaked urine? N -  Do you have problems with loss of bowel control? N -  Managing your Medications? N -  Managing your Finances? N -  Housekeeping or managing your Housekeeping? N -  Some recent data might be hidden    Patient Care Team: Claretta Fraise, MD as PCP - General (Family Medicine)  Indicate any recent Medical Services you may have received from other than [facility] providers in the past year (date may be approximate).     Assessment:   This is a routine wellness examination for Travis Wood.  Hearing/Vision screen Hearing Screening - Comments:: Denies hearing difficulties  Vision Screening - Comments:: Denies vision difficulties - up to date with annual eye exams at [place] issues and exercise activities discussed: Current Exercise Habits: Home exercise routine, Type of exercise: walking;Other - see comments (chopping wood, gardening, yard work, golf 3x per week), Time (Minutes):  30, Frequency (Times/Week): 7, Weekly Exercise (Minutes/Week): 210, Intensity: Moderate, Exercise limited by: orthopedic condition(s)   Goals Addressed             This Visit's Progress    DIET - REDUCE FAT INTAKE   On track    Reduce intake of fried foods.       Depression Screen PHQ 2/9 Scores 09/20/2021 08/21/2021 05/30/2020 08/04/2019 12/22/2018 05/31/2018 05/13/2018  PHQ - 2 Score 2 2 0 0 0 0 0  PHQ- 9 Score 4 8 - - - - -    Fall Risk Fall Risk  09/20/2021 08/21/2021 05/30/2020 08/04/2019 12/22/2018  Falls in the past year? 0 0 0 0 1  Number falls in past yr: 0 - 0 - 0  Injury with Fall? 0 - 0 - 1  Risk for fall due to : Orthopedic patient - No Fall Risks - -  Follow up Falls prevention discussed - Falls evaluation completed - -    FALL RISK PREVENTION PERTAINING TO THE HOME:  Any stairs in or around the home? Yes  If so, are there any without handrails? No  Home free of loose throw rugs in walkways, pet beds, electrical cords, etc? Yes  Adequate lighting in your home to reduce risk of falls? Yes   ASSISTIVE DEVICES UTILIZED TO PREVENT FALLS:  Life alert? No  Use of a cane, walker or w/c? No  Grab bars in the bathroom? Yes  Shower chair or bench in shower? Yes  Elevated toilet seat or a handicapped toilet? Yes   TIMED UP AND GO:  Was the test performed? No . Telephonic visit  Cognitive Function: Normal cognitive status assessed by direct observation by this Nurse Health Advisor. No abnormalities found.    MMSE - Mini Mental State Exam 06/01/2018  Orientation to time 5  Orientation to Place 4  Registration 3  Attention/ Calculation 5  Recall 3  Language- name 2 objects 2  Language- repeat 1  Language- follow 3 step command 3  Language- read & follow direction 1  Write a sentence 1  Copy design 1  Total  score 29        Immunizations Immunization History  Administered Date(s) Administered   Fluad Quad(high Dose 65+) 10/10/2020   Influenza, High Dose Seasonal  PF 08/29/2016, 09/01/2018   Pneumococcal Conjugate-13 05/31/2018   Pneumococcal Polysaccharide-23 10/10/2020   Tdap 04/21/2011    TDAP status: Due, Education has been provided regarding the importance of this vaccine. Advised may receive this vaccine at local pharmacy or Health Dept. Aware to provide a copy of the vaccination record if obtained from local pharmacy or Health Dept. Verbalized acceptance and understanding.  Flu Vaccine status: Due, Education has been provided regarding the importance of this vaccine. Advised may receive this vaccine at local pharmacy or Health Dept. Aware to provide a copy of the vaccination record if obtained from local pharmacy or Health Dept. Verbalized acceptance and understanding.  Pneumococcal vaccine status: Up to date  Covid-19 vaccine status: Declined, Education has been provided regarding the importance of this vaccine but patient still declined. Advised may receive this vaccine at local pharmacy or Health Dept.or vaccine clinic. Aware to provide a copy of the vaccination record if obtained from local pharmacy or Health Dept. Verbalized acceptance and understanding.  Qualifies for Shingles Vaccine? Yes   Zostavax completed No   Shingrix Completed?: No.    Education has been provided regarding the importance of this vaccine. Patient has been advised to call insurance company to determine out of pocket expense if they have not yet received this vaccine. Advised may also receive vaccine at local pharmacy or Health Dept. Verbalized acceptance and understanding.  Screening Tests Health Maintenance  Topic Date Due   Zoster Vaccines- Shingrix (1 of 2) Never done   COLONOSCOPY (Pts 45-55yrs Insurance coverage will need to be confirmed)  02/22/2021   TETANUS/TDAP  04/20/2021   INFLUENZA VACCINE  02/28/2022 (Originally 07/01/2021)   COVID-19 Vaccine (1) 09/06/2022 (Originally 01/24/1952)   Pneumonia Vaccine 7+ Years old  Completed   Hepatitis C Screening   Completed   HPV VACCINES  Aged Out    Health Maintenance  Health Maintenance Due  Topic Date Due   Zoster Vaccines- Shingrix (1 of 2) Never done   COLONOSCOPY (Pts 45-40yrs Insurance coverage will need to be confirmed)  02/22/2021   TETANUS/TDAP  04/20/2021    Colorectal cancer screening: Referral to GI placed 09/20/2021. Pt aware the office will call re: appt.  Lung Cancer Screening: (Low Dose CT Chest recommended if Age 30-80 years, 30 pack-year currently smoking OR have quit w/in 15years.) does not qualify.   Additional Screening:  Hepatitis C Screening: does qualify; Completed 08/29/2016  Vision Screening: Recommended annual ophthalmology exams for early detection of glaucoma and other disorders of the eye. Is the patient up to date with their annual eye exam?  Yes  Who is the provider or what is the name of the office in which the patient attends annual eye exams? [facility] If pt is not established with a provider, would they like to be referred to a provider to establish care? No .   Dental Screening: Recommended annual dental exams for proper oral hygiene  Community Resource Referral / Chronic Care Management: CRR required this visit?  No   CCM required this visit?  No      Plan:     I have personally reviewed and noted the following in the patient's chart:   Medical and social history Use of alcohol, tobacco or illicit drugs  Current medications and supplements including opioid prescriptions. Patient is not  currently taking opioid prescriptions. Functional ability and status Nutritional status Physical activity Advanced directives List of other physicians Hospitalizations, surgeries, and ER visits in previous 12 months Vitals Screenings to include cognitive, depression, and falls Referrals and appointments  In addition, I have reviewed and discussed with patient certain preventive protocols, quality metrics, and best practice recommendations. A  written personalized care plan for preventive services as well as general preventive health recommendations were provided to patient.     Sandrea Hammond, LPN   [id number]/24/[id number]   Nurse Notes: None

## 2021-09-23 ENCOUNTER — Other Ambulatory Visit: Payer: Self-pay

## 2021-09-23 ENCOUNTER — Ambulatory Visit (INDEPENDENT_AMBULATORY_CARE_PROVIDER_SITE_OTHER): Payer: Medicare HMO | Admitting: Family Medicine

## 2021-09-23 ENCOUNTER — Encounter: Payer: Self-pay | Admitting: Family Medicine

## 2021-09-23 VITALS — BP 90/55 | HR 53 | Temp 98.0°F | Ht 75.0 in | Wt 203.6 lb

## 2021-09-23 DIAGNOSIS — C9110 Chronic lymphocytic leukemia of B-cell type not having achieved remission: Secondary | ICD-10-CM

## 2021-09-23 DIAGNOSIS — Z23 Encounter for immunization: Secondary | ICD-10-CM

## 2021-09-23 DIAGNOSIS — Z1211 Encounter for screening for malignant neoplasm of colon: Secondary | ICD-10-CM

## 2021-09-23 DIAGNOSIS — E782 Mixed hyperlipidemia: Secondary | ICD-10-CM

## 2021-09-23 MED ORDER — ATORVASTATIN CALCIUM 40 MG PO TABS: ORAL_TABLET | ORAL | 2 refills | Status: DC

## 2021-09-23 NOTE — Progress Notes (Signed)
Subjective:  Patient ID: Travis Wood, male    DOB: 1951-11-22  Age: 70 y.o. MRN: [id number]  CC: Medical Management of Chronic Issues   HPI Travis Wood presents for patient in for follow-up of elevated cholesterol. Doing well without complaints on current medication. Denies side effects of statin including myalgia and arthralgia and nausea. Also in today for liver function testing. Currently no chest pain, shortness of breath or other cardiovascular related symptoms noted.  Patient also was found to be anemic recently.  He just started iron a few days ago.  No complaints with regard to tolerance so far.  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  He asked me to check on that referral for him since his been a month.  He is asymptomatic.  Patient also has had a right knee replacement in the past.  He says he golfs 3 times a week and has no symptoms.  He never even really notices it is there.  Depression screen [facility] 2/9 09/23/2021 09/20/2021 08/21/2021  Decreased Interest 0 2 2  Down, Depressed, Hopeless 0 0 0  PHQ - 2 Score 0 2 2  Altered sleeping - 0 0  Tired, decreased energy - 2 3  Change in appetite - 0 2  Feeling bad or failure about yourself  - 0 0  Trouble concentrating - 0 1  Moving slowly or fidgety/restless - 0 0  Suicidal thoughts - 0 0  PHQ-9 Score - 4 8  Difficult doing work/chores - Somewhat difficult -    History Cray has a past medical history of Cataract, CLL (chronic lymphocytic leukemia) ([place]), Erectile dysfunction, History of bronchitis, History of colon polyps, Hyperlipemia, Osteoarthritis, and Shortness of breath dyspnea.   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   His family history  includes Cancer in his sister; Colon cancer in his father; Colon polyps in his father; Heart disease in his father, maternal grandmother, and mother; Hypertension in his brother; Obesity in his sister.He reports that he quit smoking about 30 years ago. His smoking use included cigarettes and pipe. He has never used smokeless tobacco. He reports current alcohol use of about 1.0 standard drink per week. He reports current drug use. Frequency: 1.00 time per week. Drug: Marijuana.    ROS Review of Systems  Constitutional:  Negative for fever.  Respiratory:  Negative for shortness of breath.   Cardiovascular:  Negative for chest pain.  Musculoskeletal:  Negative for arthralgias.  Skin:  Negative for rash.   Objective:  BP (!) 90/55   Pulse (!) 53   Temp 98 F (36.7 C)   Ht '6\' 3"'  (1.905 m)   Wt 203 lb 9.6 oz (92.4 kg)   SpO2 96%   BMI 25.45 kg/m   BP Readings from Last 3 Encounters:  09/23/21 (!) 90/55  08/21/21 107/61  03/27/21 107/67    Wt Readings from Last 3 Encounters:  09/23/21 203 lb 9.6 oz (92.4 kg)  09/20/21 197 lb (89.4 kg)  08/21/21 197 lb (89.4 kg)     Physical Exam Constitutional:      General: He is not in acute distress.    Appearance: He is well-developed.  HENT:     Head: Normocephalic and atraumatic.     Right Ear: External ear normal.  Left Ear: External ear normal.     Nose: Nose normal.  Eyes:     Conjunctiva/sclera: Conjunctivae normal.     Pupils: Pupils are equal, round, and reactive to light.  Cardiovascular:     Rate and Rhythm: Normal rate and regular rhythm.     Heart sounds: Normal heart sounds. No murmur heard. Pulmonary:     Effort: Pulmonary effort is normal. No respiratory distress.     Breath sounds: Normal breath sounds. No wheezing or rales.  Abdominal:     Palpations: Abdomen is soft.     Tenderness: There is no abdominal tenderness.  Musculoskeletal:        General: Normal range of motion.     Cervical back: Normal range of  motion and neck supple.  Skin:    General: Skin is warm and dry.  Neurological:     Mental Status: He is alert and oriented to person, place, and time.     Deep Tendon Reflexes: Reflexes are normal and symmetric.  Psychiatric:        Behavior: Behavior normal.        Thought Content: Thought content normal.        Judgment: Judgment normal.      Assessment & Plan:   Perri was seen today for medical management of chronic issues.  Diagnoses and all orders for this visit:  CLL (chronic lymphocytic leukemia) ([place]) -     CBC with Differential/Platelet -     CMP14+EGFR -     Lipid panel  Mixed hyperlipidemia -     atorvastatin (LIPITOR) 40 MG tablet; TAKE 1 TABLET BY MOUTH DAILY AT 6 PM. -     CBC with Differential/Platelet -     CMP14+EGFR -     Lipid panel  Screen for colon cancer      I am having [facility] maintain his multivitamin with minerals, OLIVE LEAF EXTRACT PO, Vitamin D (Ergocalciferol), ferrous sulfate, and atorvastatin.  Allergies as of 09/23/2021   No Known Allergies      Medication List        Accurate as of September 23, 2021  2:41 PM. If you have any questions, ask your nurse or doctor.          atorvastatin 40 MG tablet Commonly known as: LIPITOR TAKE 1 TABLET BY MOUTH DAILY AT 6 PM. What changed:  how much to take how to take this additional instructions Changed by: Claretta Fraise, MD   ferrous sulfate 325 (65 FE) MG tablet Take 325 mg by mouth daily with breakfast.   multivitamin with minerals Tabs tablet Take 1 tablet by mouth daily.   OLIVE LEAF EXTRACT PO Take 1 tablet by mouth in the morning and at bedtime.   Vitamin D (Ergocalciferol) 1.25 MG (50000 UNIT) Caps capsule Commonly known as: DRISDOL Take 1 capsule (50,000 Units total) by mouth every 7 (seven) days.         Follow-up: Return in about 6 months (around 03/24/2022) for [facility] physical.  Claretta Fraise, M.D.

## 2021-09-24 LAB — CBC WITH DIFFERENTIAL/PLATELET
Basophils Absolute: 0.1 10*3/uL (ref 0.0–0.2)
Basos: 0 %
EOS (ABSOLUTE): 0 10*3/uL (ref 0.0–0.4)
Eos: 0 %
Hematocrit: 36.2 % — ABNORMAL LOW (ref 37.5–51.0)
Hemoglobin: 11.7 g/dL — ABNORMAL LOW (ref 13.0–17.7)
Immature Grans (Abs): 0 10*3/uL (ref 0.0–0.1)
Immature Granulocytes: 0 %
Lymphocytes Absolute: 19.1 10*3/uL — ABNORMAL HIGH (ref 0.7–3.1)
Lymphs: 85 %
MCH: 29 pg (ref 26.6–33.0)
MCHC: 32.3 g/dL (ref 31.5–35.7)
MCV: 90 fL (ref 79–97)
Monocytes Absolute: 0.6 10*3/uL (ref 0.1–0.9)
Monocytes: 3 %
Neutrophils Absolute: 2.6 10*3/uL (ref 1.4–7.0)
Neutrophils: 12 %
Platelets: 104 10*3/uL — ABNORMAL LOW (ref 150–450)
RBC: 4.03 x10E6/uL — ABNORMAL LOW (ref 4.14–5.80)
RDW: 14.1 % (ref 11.6–15.4)
WBC: 22.4 10*3/uL (ref 3.4–10.8)

## 2021-09-24 LAB — CMP14+EGFR
ALT: 14 IU/L (ref 0–44)
AST: 21 IU/L (ref 0–40)
Albumin/Globulin Ratio: 2.3 — ABNORMAL HIGH (ref 1.2–2.2)
Albumin: 4.2 g/dL (ref 3.8–4.8)
Alkaline Phosphatase: 115 IU/L (ref 44–121)
BUN/Creatinine Ratio: 14 (ref 10–24)
BUN: 13 mg/dL (ref 8–27)
Bilirubin Total: 0.4 mg/dL (ref 0.0–1.2)
CO2: 24 mmol/L (ref 20–29)
Calcium: 8.7 mg/dL (ref 8.6–10.2)
Chloride: 104 mmol/L (ref 96–106)
Creatinine, Ser: 0.94 mg/dL (ref 0.76–1.27)
Globulin, Total: 1.8 g/dL (ref 1.5–4.5)
Glucose: 92 mg/dL (ref 70–99)
Potassium: 5.1 mmol/L (ref 3.5–5.2)
Sodium: 142 mmol/L (ref 134–144)
Total Protein: 6 g/dL (ref 6.0–8.5)
eGFR: 87 mL/min/{1.73_m2} (ref >59)

## 2021-09-24 LAB — LIPID PANEL
Chol/HDL Ratio: 4.5 ratio (ref 0.0–5.0)
Cholesterol, Total: 136 mg/dL (ref 100–199)
HDL: 30 mg/dL — ABNORMAL LOW (ref >39)
LDL Chol Calc (NIH): 85 mg/dL (ref 0–99)
Triglycerides: 114 mg/dL (ref 0–149)
VLDL Cholesterol Cal: 21 mg/dL (ref 5–40)

## 2021-11-08 ENCOUNTER — Encounter: Payer: Self-pay | Admitting: Internal Medicine

## 2022-01-05 DIAGNOSIS — R1110 Vomiting, unspecified: Secondary | ICD-10-CM | POA: Diagnosis not present

## 2022-01-05 DIAGNOSIS — R197 Diarrhea, unspecified: Secondary | ICD-10-CM | POA: Diagnosis not present

## 2022-01-05 DIAGNOSIS — E785 Hyperlipidemia, unspecified: Secondary | ICD-10-CM | POA: Diagnosis not present

## 2022-01-05 DIAGNOSIS — Z79899 Other long term (current) drug therapy: Secondary | ICD-10-CM | POA: Diagnosis not present

## 2022-01-05 DIAGNOSIS — D72829 Elevated white blood cell count, unspecified: Secondary | ICD-10-CM | POA: Diagnosis not present

## 2022-01-09 ENCOUNTER — Telehealth: Payer: Self-pay | Admitting: Family Medicine

## 2022-01-09 NOTE — Telephone Encounter (Signed)
Pt scheduled 2/14

## 2022-01-09 NOTE — Telephone Encounter (Signed)
Patient needs hospital follow up. Please call back.  ?

## 2022-01-14 ENCOUNTER — Ambulatory Visit: Payer: Medicare HMO | Admitting: Family Medicine

## 2022-01-15 ENCOUNTER — Ambulatory Visit (INDEPENDENT_AMBULATORY_CARE_PROVIDER_SITE_OTHER): Payer: Medicare HMO | Admitting: Nurse Practitioner

## 2022-01-15 ENCOUNTER — Encounter: Payer: Self-pay | Admitting: Nurse Practitioner

## 2022-01-15 VITALS — BP 103/63 | HR 55 | Temp 98.1°F | Ht 75.0 in | Wt 199.0 lb

## 2022-01-15 DIAGNOSIS — R110 Nausea: Secondary | ICD-10-CM | POA: Diagnosis not present

## 2022-01-15 MED ORDER — ONDANSETRON HCL 4 MG PO TABS: 4.0000 mg | ORAL_TABLET | Freq: Three times a day (TID) | ORAL | 0 refills | Status: DC | PRN

## 2022-01-15 NOTE — Patient Instructions (Signed)
Diarrhea, Adult °Diarrhea is when you pass loose and watery poop (stool) often. Diarrhea can make you feel weak and cause you to lose water in your body (get dehydrated). Losing water in your body can cause you to: °Feel tired and thirsty. °Have a dry mouth. °Go pee (urinate) less often. °Diarrhea often lasts 2-3 days. However, it can last longer if it is a sign of something more serious. It is important to treat your diarrhea as told by your doctor. °Follow these instructions at home: °Eating and drinking °  °Follow these instructions as told by your doctor: °Take an ORS (oral rehydration solution). This is a drink that helps you replace fluids and minerals your body lost. It is sold at pharmacies and stores. °Drink plenty of fluids, such as: °Water. °Ice chips. °Diluted fruit juice. °Low-calorie sports drinks. °Milk, if you want. °Avoid drinking fluids that have a lot of sugar or caffeine in them. °Eat bland, easy-to-digest foods in small amounts as you are able. These foods include: °Bananas. °Applesauce. °Rice. °Low-fat (lean) meats. °Toast. °Crackers. °Avoid alcohol. °Avoid spicy or fatty foods. ° °Medicines °Take over-the-counter and prescription medicines only as told by your doctor. °If you were prescribed an antibiotic medicine, take it as told by your doctor. Do not stop using the antibiotic even if you start to feel better. °General instructions ° °Wash your hands often using soap and water. If soap and water are not available, use a hand sanitizer. Others in your home should wash their hands as well. Hands should be washed: °After using the toilet or changing a diaper. °Before preparing, cooking, or serving food. °While caring for a sick person. °While visiting someone in a hospital. °Drink enough fluid to keep your pee (urine) pale yellow. °Rest at home while you get better. °Take a warm bath to help with any burning or pain from having diarrhea. °Watch your condition for any changes. °Keep all  follow-up visits as told by your doctor. This is important. °Contact a doctor if: °You have a fever. °Your diarrhea gets worse. °You have new symptoms. °You cannot keep fluids down. °You feel light-headed or dizzy. °You have a headache. °You have muscle cramps. °Get help right away if: °You have chest pain. °You feel very weak or you pass out (faint). °You have bloody or black poop or poop that looks like tar. °You have very bad pain, cramping, or bloating in your belly (abdomen). °You have trouble breathing or you are breathing very quickly. °Your heart is beating very quickly. °Your skin feels cold and clammy. °You feel confused. °You have signs of losing too much water in your body, such as: °Dark pee, very little pee, or no pee. °Cracked lips. °Dry mouth. °Sunken eyes. °Sleepiness. °Weakness. °Summary °Diarrhea is when you pass loose and watery poop (stool) often. °Diarrhea can make you feel weak and cause you to lose water in your body (get dehydrated). °Take an ORS (oral rehydration solution). This is a drink that is sold at pharmacies and stores. °Eat bland, easy-to-digest foods in small amounts as you are able. °Contact a doctor if your condition gets worse. Get help right away if you have signs that you have lost too much water in your body. °This information is not intended to replace advice given to you by your health care provider. Make sure you discuss any questions you have with your health care provider. °Document Revised: 05/29/2021 Document Reviewed: 05/29/2021 °Elsevier Patient Education © 2022 Else[employer] Inc. ° °

## 2022-01-15 NOTE — Progress Notes (Signed)
Established Patient Office Visit  Subjective:  Patient ID: Travis Wood, male    DOB: July 05, 1951  Age: 71 y.o. MRN: [id number]  CC:  Chief Complaint  Patient presents with   Hospitalization Follow-up    HPI Travis Wood presents for mild  nausea after hospitalization for diarrhea, nausea and vomiting.  All symptoms have resolved.  Completed discharge instructions with medication reconciliation.  Patient verbalized understanding.  Past Medical History:  Diagnosis Date   Cataract    CLL (chronic lymphocytic leukemia) ([facility])    Erectile dysfunction    History of bronchitis    History of colon polyps    Hyperlipemia    Osteoarthritis    Shortness of breath dyspnea     Past Surgical History:  Procedure Laterality Date   APPENDECTOMY     CATARACT EXTRACTION W/PHACO Left 10/15/2015   Procedure: CATARACT EXTRACTION PHACO AND INTRAOCULAR LENS PLACEMENT LEFT EYE;  Surgeon: Tonny Branch, MD;  Location: [facility];  Service: Ophthalmology;  Laterality: Left;  CDE:12.20   CATARACT EXTRACTION W/PHACO Right 11/12/2015   Procedure: CATARACT EXTRACTION PHACO AND INTRAOCULAR LENS PLACEMENT RIGHT EYE:  CDE:  8.99;  Surgeon: Tonny Branch, MD;  Location: [facility];  Service: Ophthalmology;  Laterality: Right;   KNEE SURGERY     Left x 2    MASS EXCISION N/A 09/12/2015   Procedure: EXCISION SOFT TISSUE NEOPLASM (6 CM), BACK;  Surgeon: Aviva Signs, MD;  Location: [facility];  Service: General;  Laterality: N/A;   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   Procedure: TOTAL KNEE ARTHROPLASTY;  Surgeon: Paralee Cancel, MD;  Location: [facility];  Service: Orthopedics;  Laterality: Right;  70 mins    Family History  Problem Relation Age of Onset   Colon cancer Father        age 58    Colon polyps Father    Heart disease Father        Heart failure   Heart disease Mother    Cancer Sister        Uterine, and ovarian   Hypertension Brother    Obesity Sister    Heart disease Maternal Grandmother    Esophageal  cancer Neg Hx    Liver cancer Neg Hx    Pancreatic cancer Neg Hx    Rectal cancer Neg Hx    Stomach cancer Neg Hx     Social History   Socioeconomic History   Marital status: Single    Spouse name: Not on file   Number of children: 2   Years of education: Not on file   Highest education level: Not on file  Occupational History   Occupation: RETIRED  Tobacco Use   Smoking status: Former    Packs/day: 0.00    Years: 0.00    Pack years: 0.00    Types: Cigarettes, Pipe    Quit date: 09/10/1991    Years since quitting: 30.3   Smokeless tobacco: Never  Vaping Use   Vaping Use: Never used  Substance and Sexual Activity   Alcohol use: Yes    Alcohol/week: 1.0 standard drink    Types: 1 Cans of beer per week    Comment: occasional   Drug use: Yes    Frequency: 1.0 times per week    Types: Marijuana   Sexual activity: Yes  Other Topics Concern   Not on file  Social History Narrative   0 caffeine drinks daily    Social Determinants of Health   Financial  Resource Strain: Low Risk    Difficulty of Paying Living Expenses: Not hard at all  Food Insecurity: No Food Insecurity   Worried About Charity fundraiser in the Last Year: Never true   Ran Out of Food in the Last Year: Never true  Transportation Needs: No Transportation Needs   Lack of Transportation (Medical): No   Lack of Transportation (Non-Medical): No  Physical Activity: Sufficiently Active   Days of Exercise per Week: 7 days   Minutes of Exercise per Session: 30 min  Stress: No Stress Concern Present   Feeling of Stress : Not at all  Social Connections: Socially Integrated   Frequency of Communication with Friends and Family: More than three times a week   Frequency of Social Gatherings with Friends and Family: More than three times a week   Attends Religious Services: 1 to 4 times per year   Active Member of [employer] or Organizations: Yes   Attends Music therapist: More than 4 times per year    Marital Status: Living with partner  Intimate Partner Violence: Not At Risk   Fear of Current or Ex-Partner: No   Emotionally Abused: No   Physically Abused: No   Sexually Abused: No    Outpatient Medications Prior to Visit  Medication Sig Dispense Refill   atorvastatin (LIPITOR) 40 MG tablet TAKE 1 TABLET BY MOUTH DAILY AT 6 PM. 90 tablet 2   ferrous sulfate 325 (65 FE) MG tablet Take 325 mg by mouth daily with breakfast.     Multiple Vitamin (MULTIVITAMIN WITH MINERALS) TABS tablet Take 1 tablet by mouth daily.     OLIVE LEAF EXTRACT PO Take 1 tablet by mouth in the morning and at bedtime.     Vitamin D, Ergocalciferol, (DRISDOL) 1.25 MG (50000 UNIT) CAPS capsule Take 1 capsule (50,000 Units total) by mouth every 7 (seven) days. 13 capsule 3   No facility-administered medications prior to visit.    No Known Allergies  ROS Review of Systems  Constitutional: Negative.   HENT: Negative.    Eyes: Negative.   Respiratory: Negative.    Cardiovascular: Negative.   Gastrointestinal:  Positive for nausea.  Genitourinary: Negative.   Skin:  Negative for rash.  All other systems reviewed and are negative.    Objective:    Physical Exam Vitals and nursing note reviewed.  Constitutional:      Appearance: Normal appearance.  HENT:     Right Ear: External ear normal.     Left Ear: External ear normal.     Nose: Nose normal.  Eyes:     Conjunctiva/sclera: Conjunctivae normal.  Cardiovascular:     Rate and Rhythm: Normal rate and regular rhythm.     Pulses: Normal pulses.     Heart sounds: Normal heart sounds.  Pulmonary:     Effort: Pulmonary effort is normal.     Breath sounds: Normal breath sounds.  Abdominal:     General: Bowel sounds are normal.  Neurological:     Mental Status: He is alert and oriented to person, place, and time.  Psychiatric:        Behavior: Behavior normal.    BP 103/63    Pulse (!) 55    Temp 98.1 F (36.7 C)    Ht 6' 3" (1.905 m)    Wt 199 lb  (90.3 kg)    SpO2 96%    BMI 24.87 kg/m  Wt Readings from Last 3 Encounters:  01/15/22 199  lb (90.3 kg)  09/23/21 203 lb 9.6 oz (92.4 kg)  09/20/21 197 lb (89.4 kg)     Health Maintenance Due  Topic Date Due   COLONOSCOPY (Pts 45-10yr Insurance coverage will need to be confirmed)  02/22/2021    There are no preventive care reminders to display for this patient.  Lab Results  Component Value Date   TSH 2.270 08/21/2021   Lab Results  Component Value Date   WBC 22.4 (HH) 09/23/2021   HGB 11.7 (L) 09/23/2021   HCT 36.2 (L) 09/23/2021   MCV 90 09/23/2021   PLT 104 (L) 09/23/2021   Lab Results  Component Value Date   NA 142 09/23/2021   K 5.1 09/23/2021   CO2 24 09/23/2021   GLUCOSE 92 09/23/2021   BUN 13 09/23/2021   CREATININE 0.94 09/23/2021   BILITOT 0.4 09/23/2021   ALKPHOS 115 09/23/2021   AST 21 09/23/2021   ALT 14 09/23/2021   PROT 6.0 09/23/2021   ALBUMIN 4.2 09/23/2021   CALCIUM 8.7 09/23/2021   ANIONGAP 6 10/10/2020   EGFR 87 09/23/2021   Lab Results  Component Value Date   CHOL 136 09/23/2021   Lab Results  Component Value Date   HDL 30 (L) 09/23/2021   Lab Results  Component Value Date   LDLCALC 85 09/23/2021   Lab Results  Component Value Date   TRIG 114 09/23/2021   Lab Results  Component Value Date   CHOLHDL 4.5 09/23/2021   No results found for: HGBA1C    Assessment & Plan:   Problem List Items Addressed This Visit   None Visit Diagnoses     Nausea    -  Primary       Meds ordered this encounter  Medications   ondansetron (ZOFRAN) 4 MG tablet    Sig: Take 1 tablet (4 mg total) by mouth every 8 (eight) hours as needed for nausea or vomiting.    Dispense:  20 tablet    Refill:  0    Order Specific Question:   Supervising Provider    Answer:   SClaretta Fraise[[[id number]   Follow-up: Return if symptoms worsen or fail to improve.    OIvy Lynn NP

## 2022-01-24 ENCOUNTER — Encounter: Payer: Medicare HMO | Admitting: Internal Medicine

## 2022-02-27 ENCOUNTER — Other Ambulatory Visit: Payer: Self-pay | Admitting: Family Medicine

## 2022-02-27 DIAGNOSIS — E782 Mixed hyperlipidemia: Secondary | ICD-10-CM

## 2022-03-17 ENCOUNTER — Encounter (INDEPENDENT_AMBULATORY_CARE_PROVIDER_SITE_OTHER): Payer: Self-pay

## 2022-03-17 ENCOUNTER — Encounter: Payer: Self-pay | Admitting: Family Medicine

## 2022-03-17 ENCOUNTER — Ambulatory Visit (INDEPENDENT_AMBULATORY_CARE_PROVIDER_SITE_OTHER): Payer: Medicare HMO | Admitting: Family Medicine

## 2022-03-17 VITALS — BP 109/58 | HR 54 | Temp 97.2°F | Ht 75.0 in | Wt 200.8 lb

## 2022-03-17 DIAGNOSIS — E559 Vitamin D deficiency, unspecified: Secondary | ICD-10-CM | POA: Diagnosis not present

## 2022-03-17 DIAGNOSIS — C9110 Chronic lymphocytic leukemia of B-cell type not having achieved remission: Secondary | ICD-10-CM

## 2022-03-17 DIAGNOSIS — E782 Mixed hyperlipidemia: Secondary | ICD-10-CM | POA: Diagnosis not present

## 2022-03-17 MED ORDER — VITAMIN D (ERGOCALCIFEROL) 1.25 MG (50000 UNIT) PO CAPS: 50000.0000 [IU] | ORAL_CAPSULE | ORAL | 3 refills | Status: DC

## 2022-03-17 NOTE — Progress Notes (Signed)
? ?Subjective:  ?Patient ID: Travis Wood, male    DOB: January 03, 1951  Age: 71 y.o. MRN: [id number] ? ?CC: Medical Management of Chronic Issues ? ? ?HPI ?Travis Wood presents for follow-up of elevated cholesterol. Doing well without complaints on current medication. Denies side effects of statin including myalgia and arthralgia and nausea. Also in today for liver function testing. Currently no chest pain, shortness of breath or other cardiovascular related symptoms noted. ?History ?Travis Wood has a past medical history of Cataract, CLL (chronic lymphocytic leukemia) ([place]), Erectile dysfunction, History of bronchitis, History of colon polyps, Hyperlipemia, Osteoarthritis, and Shortness of breath dyspnea.  ? 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  ? ?His family history includes Cancer in his sister; Colon cancer in his father; Colon polyps in his father; Heart disease in his father, maternal grandmother, and mother; Hypertension in his brother; Obesity in his sister.He reports that he quit smoking about 30 years ago. His smoking use included cigarettes and pipe. He has never used smokeless tobacco. He reports current alcohol use of about 1.0 standard drink per week. He reports current drug use. Frequency: 1.00 time per week. Drug: Marijuana. ? ?Current Outpatient Medications on File Prior to Visit  ?Medication Sig Dispense Refill  ? atorvastatin (LIPITOR) 40 MG tablet TAKE 1 TABLET BY MOUTH DAILY AT 6 PM. 90 tablet 2  ? ferrous sulfate 325 (65 FE) MG tablet Take 325 mg by mouth daily with breakfast.    ? Multiple Vitamin (MULTIVITAMIN WITH MINERALS) TABS tablet Take 1 tablet by mouth daily.    ? OLIVE LEAF EXTRACT PO Take 1 tablet by mouth in the morning and at bedtime.    ? ondansetron (ZOFRAN) 4 MG tablet Take 1  tablet (4 mg total) by mouth every 8 (eight) hours as needed for nausea or vomiting. 20 tablet 0  ? ?No current facility-administered medications on file prior to visit.  ? ? ?ROS ?Review of Systems  ?Constitutional:  Negative for fever.  ?Respiratory:  Negative for shortness of breath.   ?Cardiovascular:  Negative for chest pain.  ?Musculoskeletal:  Negative for arthralgias.  ?Skin:  Negative for rash.  ? ?Objective:  ?BP (!) 109/58   Pulse (!) 54   Temp (!) 97.2 ?F (36.2 ?C)   Ht '6\' 3"'  (1.905 m)   Wt 200 lb 12.8 oz (91.1 kg)   SpO2 99%   BMI 25.10 kg/m?  ? ?BP Readings from Last 3 Encounters:  ?03/17/22 (!) 109/58  ?01/15/22 103/63  ?09/23/21 (!) 90/55  ? ? ?Wt Readings from Last 3 Encounters:  ?03/17/22 200 lb 12.8 oz (91.1 kg)  ?01/15/22 199 lb (90.3 kg)  ?09/23/21 203 lb 9.6 oz (92.4 kg)  ? ? ? ?Physical Exam ?Vitals reviewed.  ?Constitutional:   ?   Appearance: He is well-developed.  ?HENT:  ?   Head: Normocephalic and atraumatic.  ?   Right Ear: External ear normal.  ?   Left Ear: External ear normal.  ?   Mouth/Throat:  ?   Pharynx: No oropharyngeal exudate or posterior oropharyngeal erythema.  ?Eyes:  ?   Pupils: Pupils are equal, round, and reactive to light.  ?Cardiovascular:  ?   Rate and Rhythm: Normal rate and regular rhythm.  ?   Heart sounds: No murmur heard. ?Pulmonary:  ?   Effort: No respiratory distress.  ?  Breath sounds: Normal breath sounds.  ?Musculoskeletal:  ?   Cervical back: Normal range of motion and neck supple.  ?Neurological:  ?   Mental Status: He is alert and oriented to person, place, and time.  ? ? ?No results found for: HGBA1C ? ?Lab Results  ?Component Value Date  ? WBC 22.4 (HH) 09/23/2021  ? HGB 11.7 (L) 09/23/2021  ? HCT 36.2 (L) 09/23/2021  ? PLT 104 (L) 09/23/2021  ? GLUCOSE 92 09/23/2021  ? CHOL 136 09/23/2021  ? TRIG 114 09/23/2021  ? HDL 30 (L) 09/23/2021  ? [place] 85 09/23/2021  ? ALT 14 09/23/2021  ? AST 21 09/23/2021  ? NA 142 09/23/2021  ? K 5.1 09/23/2021  ?  CL 104 09/23/2021  ? CREATININE 0.94 09/23/2021  ? BUN 13 09/23/2021  ? CO2 24 09/23/2021  ? TSH 2.270 08/21/2021  ? PSA 1.8 02/20/2015  ? ? ?No results found. ? ?Assessment & Plan:  ? ?Travis Wood was seen today for medical management of chronic issues. ? ?Diagnoses and all orders for this visit: ? ?Mixed hyperlipidemia ?-     CBC with Differential/Platelet ?-     CMP14+EGFR ?-     Lipid panel ? ?Vitamin D deficiency ?-     Vitamin D, Ergocalciferol, (DRISDOL) 1.25 MG (50000 UNIT) CAPS capsule; Take 1 capsule (50,000 Units total) by mouth every 7 (seven) days. ?-     CBC with Differential/Platelet ?-     CMP14+EGFR ?-     VITAMIN D 25 Hydroxy (Vit-D Deficiency, Fractures) ? ?Chronic lymphocytic leukemia ([place]) ?-     CBC with Differential/Platelet ?-     CMP14+EGFR ? ? ?I am having Travis Wood maintain his multivitamin with minerals, OLIVE LEAF EXTRACT PO, ferrous sulfate, ondansetron, atorvastatin, and Vitamin D (Ergocalciferol). ? ?Meds ordered this encounter  ?Medications  ? Vitamin D, Ergocalciferol, (DRISDOL) 1.25 MG (50000 UNIT) CAPS capsule  ?  Sig: Take 1 capsule (50,000 Units total) by mouth every 7 (seven) days.  ?  Dispense:  13 capsule  ?  Refill:  3  ? ? ? ?Follow-up: Return in about 6 months (around 09/16/2022). ? ?Travis Wood, M.D. ?

## 2022-03-18 LAB — LIPID PANEL
Chol/HDL Ratio: 5 ratio (ref 0.0–5.0)
Cholesterol, Total: 150 mg/dL (ref 100–199)
HDL: 30 mg/dL — ABNORMAL LOW (ref >39)
LDL Chol Calc (NIH): 97 mg/dL (ref 0–99)
Triglycerides: 129 mg/dL (ref 0–149)
VLDL Cholesterol Cal: 23 mg/dL (ref 5–40)

## 2022-03-18 LAB — CBC WITH DIFFERENTIAL/PLATELET
Basophils Absolute: 0.1 10*3/uL (ref 0.0–0.2)
Basos: 0 %
EOS (ABSOLUTE): 0.1 10*3/uL (ref 0.0–0.4)
Eos: 0 %
Hematocrit: 36.2 % — ABNORMAL LOW (ref 37.5–51.0)
Hemoglobin: 12.1 g/dL — ABNORMAL LOW (ref 13.0–17.7)
Immature Grans (Abs): 0 10*3/uL (ref 0.0–0.1)
Immature Granulocytes: 0 %
Lymphocytes Absolute: 30.1 10*3/uL — ABNORMAL HIGH (ref 0.7–3.1)
Lymphs: 88 %
MCH: 30.3 pg (ref 26.6–33.0)
MCHC: 33.4 g/dL (ref 31.5–35.7)
MCV: 91 fL (ref 79–97)
Monocytes Absolute: 1.1 10*3/uL — ABNORMAL HIGH (ref 0.1–0.9)
Monocytes: 3 %
Neutrophils Absolute: 3.3 10*3/uL (ref 1.4–7.0)
Neutrophils: 9 %
Platelets: 79 10*3/uL — CL (ref 150–450)
RBC: 3.99 x10E6/uL — ABNORMAL LOW (ref 4.14–5.80)
RDW: 13.7 % (ref 11.6–15.4)
WBC: 34.7 10*3/uL (ref 3.4–10.8)

## 2022-03-18 LAB — CMP14+EGFR
ALT: 17 IU/L (ref 0–44)
AST: 20 IU/L (ref 0–40)
Albumin/Globulin Ratio: 3.2 — ABNORMAL HIGH (ref 1.2–2.2)
Albumin: 4.5 g/dL (ref 3.8–4.8)
Alkaline Phosphatase: 98 IU/L (ref 44–121)
BUN/Creatinine Ratio: 15 (ref 10–24)
BUN: 15 mg/dL (ref 8–27)
Bilirubin Total: 0.5 mg/dL (ref 0.0–1.2)
CO2: 26 mmol/L (ref 20–29)
Calcium: 9.2 mg/dL (ref 8.6–10.2)
Chloride: 108 mmol/L — ABNORMAL HIGH (ref 96–106)
Creatinine, Ser: 1.02 mg/dL (ref 0.76–1.27)
Globulin, Total: 1.4 g/dL — ABNORMAL LOW (ref 1.5–4.5)
Glucose: 89 mg/dL (ref 70–99)
Potassium: 5.3 mmol/L — ABNORMAL HIGH (ref 3.5–5.2)
Sodium: 146 mmol/L — ABNORMAL HIGH (ref 134–144)
Total Protein: 5.9 g/dL — ABNORMAL LOW (ref 6.0–8.5)
eGFR: 79 mL/min/{1.73_m2} (ref >59)

## 2022-03-18 LAB — VITAMIN D 25 HYDROXY (VIT D DEFICIENCY, FRACTURES): Vit D, 25-Hydroxy: 38.6 ng/mL (ref 30.0–100.0)

## 2022-11-06 ENCOUNTER — Ambulatory Visit (INDEPENDENT_AMBULATORY_CARE_PROVIDER_SITE_OTHER): Payer: Medicare Other

## 2022-11-06 DIAGNOSIS — Z0000 Encounter for general adult medical examination without abnormal findings: Secondary | ICD-10-CM

## 2022-11-06 NOTE — Patient Instructions (Signed)
  Travis Wood , Thank you for taking time to come for your Medicare Wellness Visit. I appreciate your ongoing commitment to your health goals. Please review the following plan we discussed and let me know if I can assist you in the future.   These are the goals we discussed:  Goals      DIET - REDUCE FAT INTAKE     Reduce intake of fried foods.     Patient Stated     11/06/2022 AWV Goal: Fall Prevention  Over the next year, patient will decrease their risk for falls by: Using assistive devices, such as a cane or walker, as needed Identifying fall risks within their home and correcting them by: Removing throw rugs Adding handrails to stairs or ramps Removing clutter and keeping a clear pathway throughout the home Increasing light, especially at night Adding shower handles/bars Raising toilet seat Identifying potential personal risk factors for falls: Medication side effects Incontinence/urgency Vestibular dysfunction Hearing loss Musculoskeletal disorders Neurological disorders Orthostatic hypotension          This is a list of the screening recommended for you and due dates:  Health Maintenance  Topic Date Due   COVID-19 Vaccine (1) Never done   Zoster (Shingles) Vaccine (1 of 2) Never done   Colon Cancer Screening  02/22/2021   DTaP/Tdap/Td vaccine (2 - Td or Tdap) 04/20/2021   Medicare Annual Wellness Visit  09/20/2022   Flu Shot  03/01/2023*   Pneumonia Vaccine  Completed   Hepatitis C Screening: USPSTF Recommendation to screen - Ages 18-79 yo.  Completed   HPV Vaccine  Aged Out  *Topic was postponed. The date shown is not the original due date.

## 2022-11-06 NOTE — Progress Notes (Signed)
Subjective:   Travis Wood is a 71 y.o. male who presents for Medicare Annual/Subsequent preventive examination.  I connected with  [name] on 11/06/22 by a audio enabled telemedicine application and verified that I am speaking with the correct person using two identifiers.  Patient Location: Home  Provider Location: Office/Clinic  I discussed the limitations of evaluation and management by telemedicine. The patient expressed understanding and agreed to proceed.   Review of Systems    Defer to PCP  Cardiac Risk Factors include: advanced age (>25mn, >>51women);dyslipidemia;male gender     Objective:    There were no vitals filed for this visit. There is no height or weight on file to calculate BMI.     11/06/2022    2:13 PM 09/20/2021    1:58 PM 10/29/2020    3:15 PM 10/12/2020   12:15 PM 10/09/2020   10:13 AM 10/05/2020    1:04 PM 06/13/2020    8:30 AM  Advanced Directives  Does Patient Have a Medical Advance Directive? No No No No No No No  Would patient like information on creating a medical advance directive? No - Patient declined No - Patient declined Yes (MAU/Ambulatory/Procedural Areas - Information given)  No - Patient declined  No - Patient declined    Current Medications (verified) Outpatient Encounter Medications as of 11/06/2022  Medication Sig   atorvastatin (LIPITOR) 40 MG tablet TAKE 1 TABLET BY MOUTH DAILY AT 6 PM.   ferrous sulfate 325 (65 FE) MG tablet Take 325 mg by mouth daily with breakfast.   OLIVE LEAF EXTRACT PO Take 1 tablet by mouth in the morning and at bedtime.   Multiple Vitamin (MULTIVITAMIN WITH MINERALS) TABS tablet Take 1 tablet by mouth daily. (Patient not taking: Reported on 11/06/2022)   ondansetron (ZOFRAN) 4 MG tablet Take 1 tablet (4 mg total) by mouth every 8 (eight) hours as needed for nausea or vomiting. (Patient not taking: Reported on 11/06/2022)   Vitamin D, Ergocalciferol, (DRISDOL) 1.25 MG (50000 UNIT) CAPS capsule Take 1  capsule (50,000 Units total) by mouth every 7 (seven) days. (Patient not taking: Reported on 11/06/2022)   No facility-administered encounter medications on file as of 11/06/2022.    Allergies (verified) Patient has no known allergies.   History: Past Medical History:  Diagnosis Date   Cataract    CLL (chronic lymphocytic leukemia) (HCC)    Erectile dysfunction    History of bronchitis    History of colon polyps    Hyperlipemia    Osteoarthritis    Shortness of breath dyspnea    Past Surgical History:  Procedure Laterality Date   APPENDECTOMY     CATARACT EXTRACTION W/PHACO Left 10/15/2015   Procedure: CATARACT EXTRACTION PHACO AND INTRAOCULAR LENS PLACEMENT LEFT EYE;  Surgeon: KTonny Branch MD;  Location: [facility];  Service: Ophthalmology;  Laterality: Left;  CDE:12.20   CATARACT EXTRACTION W/PHACO Right 11/12/2015   Procedure: CATARACT EXTRACTION PHACO AND INTRAOCULAR LENS PLACEMENT RIGHT EYE:  CDE:  8.99;  Surgeon: KTonny Branch MD;  Location: [facility];  Service: Ophthalmology;  Laterality: Right;   KNEE SURGERY     Left x 2    MASS EXCISION N/A 09/12/2015   Procedure: EXCISION SOFT TISSUE NEOPLASM (6 CM), BACK;  Surgeon: MAviva Signs MD;  Location: [facility];  Service: General;  Laterality: N/A;   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   Procedure: TOTAL KNEE ARTHROPLASTY;  Surgeon: OParalee Cancel MD;  Location: [facility];  Service: Orthopedics;  Laterality: Right;  70 mins   Family History  Problem Relation Age of Onset   Colon cancer Father        age 72    Colon polyps Father    Heart disease Father        Heart failure   Heart disease Mother    Cancer Sister        Uterine, and ovarian   Hypertension Brother    Obesity Sister    Heart disease Maternal Grandmother    Esophageal cancer Neg Hx    Liver cancer Neg Hx    Pancreatic cancer Neg Hx    Rectal cancer Neg Hx    Stomach cancer Neg Hx    Social History   Socioeconomic History   Marital status: Soil scientist     Spouse name: Not on file   Number of children: 2   Years of education: 12 years   Highest education level: 12th grade  Occupational History   Occupation: RETIRED  Tobacco Use   Smoking status: Former    Packs/day: 0.00    Years: 3.00    Total pack years: 0.00    Types: Cigarettes, Pipe    Quit date: 09/10/1991    Years since quitting: 31.1   Smokeless tobacco: Never  Vaping Use   Vaping Use: Never used  Substance and Sexual Activity   Alcohol use: Not on file    Comment: occasional   Drug use: Yes    Frequency: 1.0 times per week    Types: Marijuana   Sexual activity: Yes  Other Topics Concern   Not on file  Social History Narrative   2 sons who live close by   Lives with girlfriend   Social Determinants of Health   Financial Resource Strain: Low Risk  (11/06/2022)   Overall Financial Resource Strain (CARDIA)    Difficulty of Paying Living Expenses: Not hard at all  Food Insecurity: No Food Insecurity (11/06/2022)   Hunger Vital Sign    Worried About Running Out of Food in the Last Year: Never true    Ran Out of Food in the Last Year: Never true  Transportation Needs: No Transportation Needs (11/06/2022)   PRAPARE - Hydrologist (Medical): No    Lack of Transportation (Non-Medical): No  Physical Activity: Sufficiently Active (11/06/2022)   Exercise Vital Sign    Days of Exercise per Week: 7 days    Minutes of Exercise per Session: 30 min  Stress: No Stress Concern Present (11/06/2022)   [place]    Feeling of Stress : Not at all  Social Connections: [place] (11/06/2022)   Social Connection and Isolation Panel [NHANES]    Frequency of Communication with Friends and Family: More than three times a week    Frequency of Social Gatherings with Friends and Family: More than three times a week    Attends Religious Services: 1 to 4 times per year    Active Member of  [employer] or Organizations: Yes    Attends Music therapist: More than 4 times per year    Marital Status: Living with partner     Clinical Intake:  Pre-visit preparation completed: Yes  Pain : No/denies pain     Nutritional Risks: None Diabetes: No  How often do you need to have someone help you when you read instructions, pamphlets, or other written materials from your doctor or pharmacy?: 1 -  Never What is the last grade level you completed in school?: 12th grade   Diabetic?  No     Information entered by :: Felicity Coyer LPN   Activities of Daily Living    11/06/2022    2:14 PM  In your present state of health, do you have any difficulty performing the following activities:  Hearing? 1  Comment has noticed some hearing loss  Vision? 0  Difficulty concentrating or making decisions? 0  Walking or climbing stairs? 0  Dressing or bathing? 0  Doing errands, shopping? 0  Preparing Food and eating ? N  Using the Toilet? N  In the past six months, have you accidently leaked urine? N  Do you have problems with loss of bowel control? N  Managing your Medications? N  Managing your Finances? N  Housekeeping or managing your Housekeeping? N    Patient Care Team: Claretta Fraise, MD as PCP - General (Family Medicine)  Indicate any recent Medical Services you may have received from other than [facility] providers in the past year (date may be approximate).     Assessment:   This is a routine wellness examination for [place].  Hearing/Vision screen No results found.  Dietary issues and exercise activities discussed: Current Exercise Habits: Home exercise routine, Type of exercise: walking, Time (Minutes): 30, Frequency (Times/Week): 7, Weekly Exercise (Minutes/Week): 210, Intensity: Mild, Exercise limited by: None identified   Goals Addressed             This Visit's Progress    Patient Stated       11/06/2022 AWV Goal: Fall Prevention  Over the next year,  patient will decrease their risk for falls by: Using assistive devices, such as a cane or walker, as needed Identifying fall risks within their home and correcting them by: Removing throw rugs Adding handrails to stairs or ramps Removing clutter and keeping a clear pathway throughout the home Increasing light, especially at night Adding shower handles/bars Raising toilet seat Identifying potential personal risk factors for falls: Medication side effects Incontinence/urgency Vestibular dysfunction Hearing loss Musculoskeletal disorders Neurological disorders Orthostatic hypotension         Depression Screen    11/06/2022    2:13 PM 03/17/2022    1:55 PM 01/15/2022    2:00 PM 09/23/2021    1:56 PM 09/20/2021    2:25 PM 08/21/2021    8:27 AM 05/30/2020    8:29 AM  PHQ 2/9 Scores  PHQ - 2 Score 0 0 0 0 2 2 0  PHQ- 9 Score   0  4 8     Fall Risk    11/06/2022    2:18 PM 03/17/2022    1:55 PM 01/15/2022    1:57 PM 09/23/2021    1:56 PM 09/20/2021    2:24 PM  Fall Risk   Falls in the past year? 1 0 0 0 0  Number falls in past yr: 0    0  Injury with Fall? 0    0  Risk for fall due to : History of fall(s)    Orthopedic patient  Follow up Falls evaluation completed    Falls prevention discussed    [place]:  Any stairs in or around the home? Yes  If so, are there any without handrails? No  Home free of loose throw rugs in walkways, pet beds, electrical cords, etc? No  Adequate lighting in your home to reduce risk of falls? Yes  ASSISTIVE DEVICES UTILIZED TO PREVENT FALLS:  Life alert? No  Use of a cane, walker or w/c? No  Grab bars in the bathroom? No  Shower chair or bench in shower? Yes  Elevated toilet seat or a handicapped toilet? No   TIMED UP AND GO:  Was the test performed? No .   Cognitive Function:    06/01/2018    6:28 PM  MMSE - Mini Mental State Exam  Orientation to time 5  Orientation to Place 4  Registration  3  Attention/ Calculation 5  Recall 3  Language- name 2 objects 2  Language- repeat 1  Language- follow 3 step command 3  Language- read & follow direction 1  Write a sentence 1  Copy design 1  Total score 29        11/06/2022    2:16 PM  6CIT Screen  What Year? 0 points  What month? 0 points  What time? 0 points  Count back from 20 0 points  Months in reverse 0 points  Repeat phrase 0 points  Total Score 0 points    Immunizations Immunization History  Administered Date(s) Administered   Fluad Quad(high Dose 65+) 10/10/2020, 09/23/2021   Influenza, High Dose Seasonal PF 08/29/2016, 09/01/2018   Pneumococcal Conjugate-13 05/31/2018   Pneumococcal Polysaccharide-23 10/10/2020   Tdap 04/21/2011    TDAP status: Due, Education has been provided regarding the importance of this vaccine. Advised may receive this vaccine at local pharmacy or Health Dept. Aware to provide a copy of the vaccination record if obtained from local pharmacy or Health Dept. Verbalized acceptance and understanding.  Flu Vaccine status: Due, Education has been provided regarding the importance of this vaccine. Advised may receive this vaccine at local pharmacy or Health Dept. Aware to provide a copy of the vaccination record if obtained from local pharmacy or Health Dept. Verbalized acceptance and understanding.  Pneumococcal vaccine status: Up to date  Covid-19 vaccine status: Declined, Education has been provided regarding the importance of this vaccine but patient still declined. Advised may receive this vaccine at local pharmacy or Health Dept.or vaccine clinic. Aware to provide a copy of the vaccination record if obtained from local pharmacy or Health Dept. Verbalized acceptance and understanding.  Qualifies for Shingles Vaccine? Yes   Zostavax completed No   Shingrix Completed?: No.    Education has been provided regarding the importance of this vaccine. Patient has been advised to call insurance  company to determine out of pocket expense if they have not yet received this vaccine. Advised may also receive vaccine at local pharmacy or Health Dept. Verbalized acceptance and understanding.  Screening Tests Health Maintenance  Topic Date Due   COVID-19 Vaccine (1) Never done   Zoster Vaccines- Shingrix (1 of 2) Never done   COLONOSCOPY (Pts 45-75yr Insurance coverage will need to be confirmed)  02/22/2021   DTaP/Tdap/Td (2 - Td or Tdap) 04/20/2021   Medicare Annual Wellness (AWV)  09/20/2022   INFLUENZA VACCINE  03/01/2023 (Originally 07/01/2022)   Pneumonia Vaccine 71 Years old  Completed   Hepatitis C Screening  Completed   HPV VACCINES  Aged Out    Health Maintenance  Health Maintenance Due  Topic Date Due   COVID-19 Vaccine (1) Never done   Zoster Vaccines- Shingrix (1 of 2) Never done   COLONOSCOPY (Pts 45-420yrInsurance coverage will need to be confirmed)  02/22/2021   DTaP/Tdap/Td (2 - Td or Tdap) 04/20/2021   Medicare Annual Wellness (AWV)  09/20/2022  Colorectal cancer screening: Type of screening: Colonoscopy. Completed 02/22/2018. Repeat every three years  Lung Cancer Screening: (Low Dose CT Chest recommended if Age 71-80 years, 30 pack-year currently smoking OR have quit w/in 15years.) does not qualify.   Lung Cancer Screening Referral: N/A  Additional Screening:  Hepatitis C Screening: does not qualify  Vision Screening: Recommended annual ophthalmology exams for early detection of glaucoma and other disorders of the eye. Is the patient up to date with their annual eye exam?  Yes  Who is the provider or what is the name of the office in which the patient attends annual eye exams? [facility] [place] If pt is not established with a provider, would they like to be referred to a provider to establish care?  N/A .   Dental Screening: Recommended annual dental exams for proper oral hygiene  Community Resource Referral / Chronic Care Management: CRR  required this visit?  No   CCM required this visit?  No      Plan:     I have personally reviewed and noted the following in the patient's chart:   Medical and social history Use of alcohol, tobacco or illicit drugs  Current medications and supplements including opioid prescriptions. Patient is not currently taking opioid prescriptions. Functional ability and status Nutritional status Physical activity Advanced directives List of other physicians Hospitalizations, surgeries, and ER visits in previous 12 months Vitals Screenings to include cognitive, depression, and falls Referrals and appointments  In addition, I have reviewed and discussed with patient certain preventive protocols, quality metrics, and best practice recommendations. A written personalized care plan for preventive services as well as general preventive health recommendations were provided to patient.     Felicity Coyer, LPN     16/12/958   Nurse Notes: Patient is due for influenza, Tdap, and Shingrix vaccines.  Also due for colonoscopy.

## 2022-11-12 ENCOUNTER — Ambulatory Visit (INDEPENDENT_AMBULATORY_CARE_PROVIDER_SITE_OTHER): Payer: Medicare Other | Admitting: Family Medicine

## 2022-11-12 ENCOUNTER — Ambulatory Visit (INDEPENDENT_AMBULATORY_CARE_PROVIDER_SITE_OTHER): Payer: Medicare Other

## 2022-11-12 ENCOUNTER — Encounter: Payer: Self-pay | Admitting: Family Medicine

## 2022-11-12 VITALS — BP 108/57 | HR 52 | Temp 97.6°F | Ht 75.0 in | Wt 200.0 lb

## 2022-11-12 DIAGNOSIS — R062 Wheezing: Secondary | ICD-10-CM

## 2022-11-12 DIAGNOSIS — D485 Neoplasm of uncertain behavior of skin: Secondary | ICD-10-CM

## 2022-11-12 DIAGNOSIS — R1084 Generalized abdominal pain: Secondary | ICD-10-CM

## 2022-11-12 DIAGNOSIS — E782 Mixed hyperlipidemia: Secondary | ICD-10-CM

## 2022-11-12 DIAGNOSIS — E559 Vitamin D deficiency, unspecified: Secondary | ICD-10-CM | POA: Diagnosis not present

## 2022-11-12 DIAGNOSIS — Z23 Encounter for immunization: Secondary | ICD-10-CM

## 2022-11-12 DIAGNOSIS — C9110 Chronic lymphocytic leukemia of B-cell type not having achieved remission: Secondary | ICD-10-CM | POA: Diagnosis not present

## 2022-11-12 MED ORDER — VITAMIN D (ERGOCALCIFEROL) 1.25 MG (50000 UNIT) PO CAPS: 50000.0000 [IU] | ORAL_CAPSULE | ORAL | 3 refills | Status: DC

## 2022-11-12 MED ORDER — CEFUROXIME AXETIL 250 MG PO TABS
250.0000 mg | ORAL_TABLET | Freq: Two times a day (BID) | ORAL | 0 refills | Status: AC
Start: 2022-11-12 — End: 2022-11-22

## 2022-11-12 MED ORDER — ATORVASTATIN CALCIUM 40 MG PO TABS: 40.0000 mg | ORAL_TABLET | Freq: Every day | ORAL | 3 refills | Status: DC

## 2022-11-12 NOTE — Progress Notes (Signed)
Subjective:  Patient ID: Travis Wood, male    DOB: [birth year]  Age: 71 y.o. MRN: [id number]  CC: Medical Management of Chronic Issues   HPI Travis Wood presents for abd pain with diarrhea intermittently. Most recent 2 weeks ago. Lasts about 1/2 to a day at a time. No fever. Coughing and wheezing for the last few days.    in for follow-up of elevated cholesterol. Doing well without complaints on current medication. Denies side effects of statin including myalgia and arthralgia and nausea. Currently no chest pain, shortness of breath or other cardiovascular related symptoms noted.      11/12/2022    9:39 AM 11/06/2022    2:13 PM 03/17/2022    1:55 PM  Depression screen PHQ 2/9  Decreased Interest 0 0 0  Down, Depressed, Hopeless 0 0 0  PHQ - 2 Score 0 0 0    History Travis Wood has a past medical history of Cataract, CLL (chronic lymphocytic leukemia) ([place]), Erectile dysfunction, History of bronchitis, History of colon polyps, Hyperlipemia, Osteoarthritis, and Shortness of breath dyspnea.   Travis Wood has a past surgical history that includes Appendectomy; Knee surgery; Mass excision (N/A, 09/12/2015); Cataract extraction w/PHACO (Left, 10/15/2015); Cataract extraction w/PHACO (Right, 11/12/2015); and Total knee arthroplasty (Right, 10/09/2020).   His family history includes Cancer in his sister; Colon cancer in his father; Colon polyps in his father; Heart disease in his father, maternal grandmother, and mother; Hypertension in his brother; Obesity in his sister.Travis Wood reports that Travis Wood quit smoking about 31 years ago. His smoking use included cigarettes and pipe. Travis Wood has never used smokeless tobacco. Travis Wood reports current drug use. Frequency: 1.00 time per week. Drug: Marijuana. No history on file for alcohol use.    ROS Review of Systems  Constitutional:  Negative for fever.  Respiratory:  Negative for shortness of breath.   Cardiovascular:  Negative for chest pain.  Gastrointestinal:  Positive for  abdominal pain and diarrhea. Negative for blood in stool.  Musculoskeletal:  Negative for arthralgias.  Skin:  Negative for rash.    Objective:  BP (!) 108/57   Pulse (!) 52   Temp 97.6 F (36.4 C)   Ht _0  (1.905 m)   Wt 200 lb (90.7 kg)   BMI 25.00 kg/m   BP Readings from Last 3 Encounters:  11/12/22 (!) 108/57  03/17/22 (!) 109/58  01/15/22 103/63    Wt Readings from Last 3 Encounters:  11/12/22 200 lb (90.7 kg)  03/17/22 200 lb 12.8 oz (91.1 kg)  01/15/22 199 lb (90.3 kg)     Physical Exam Vitals reviewed.  Constitutional:      Appearance: Travis Wood is well-developed.  HENT:     Head: Normocephalic and atraumatic.     Right Ear: External ear normal.     Left Ear: External ear normal.     Mouth/Throat:     Pharynx: No oropharyngeal exudate or posterior oropharyngeal erythema.  Eyes:     Pupils: Pupils are equal, round, and reactive to light.  Cardiovascular:     Rate and Rhythm: Normal rate and regular rhythm.     Heart sounds: No murmur heard. Pulmonary:     Effort: No respiratory distress.     Breath sounds: Wheezing present.  Musculoskeletal:     Cervical back: Normal range of motion and neck supple.  Skin:    Findings: Lesion (left preauricular ulcerated  lesion left angle of mandible 1 cm) present.  Neurological:     Mental  Status: Travis Wood is alert and oriented to person, place, and time.       Assessment & Plan:   [place] was seen today for medical management of chronic issues.  Diagnoses and all orders for this visit:  Abdominal pain, acute, generalized -     CBC with Differential/Platelet -     CMP14+EGFR -     CT ABDOMEN PELVIS W CONTRAST; Future  Mixed hyperlipidemia -     CBC with Differential/Platelet -     CMP14+EGFR -     Lipid panel -     atorvastatin (LIPITOR) 40 MG tablet; Take 1 tablet (40 mg total) by mouth daily.  Vitamin D deficiency -     CBC with Differential/Platelet -     CMP14+EGFR -     Vitamin D, Ergocalciferol, (DRISDOL)  1.25 MG (50000 UNIT) CAPS capsule; Take 1 capsule (50,000 Units total) by mouth every 7 (seven) days. -     VITAMIN D 25 Hydroxy (Vit-D Deficiency, Fractures)  Chronic lymphocytic leukemia (HCC) -     CBC with Differential/Platelet -     CMP14+EGFR -     CT ABDOMEN PELVIS W CONTRAST; Future  Wheezing -     CBC with Differential/Platelet -     CMP14+EGFR -     DG Chest 2 View; Future  Neoplasm of uncertain behavior of skin -     Ambulatory referral to Dermatology  Need for immunization against influenza -     Flu Vaccine QUAD High Dose(Fluad)  Need for shingles vaccine -     Zoster Recombinant (Shingrix )  Other orders -     cefUROXime (CEFTIN) 250 MG tablet; Take 1 tablet (250 mg total) by mouth 2 (two) times daily with a meal for 10 days.       I have changed Abdias L. Digioia's atorvastatin. I am also having him start on cefUROXime. Additionally, I am having him maintain his multivitamin with minerals, OLIVE LEAF EXTRACT PO, ferrous sulfate, ondansetron, and Vitamin D (Ergocalciferol).  Allergies as of 11/12/2022   No Known Allergies      Medication List        Accurate as of November 12, 2022  9:22 PM. If you have any questions, ask your nurse or doctor.          atorvastatin 40 MG tablet Commonly known as: LIPITOR Take 1 tablet (40 mg total) by mouth daily. What changed: when to take this Changed by: Claretta Fraise, MD   cefUROXime 250 MG tablet Commonly known as: CEFTIN Take 1 tablet (250 mg total) by mouth 2 (two) times daily with a meal for 10 days. Started by: Claretta Fraise, MD   ferrous sulfate 325 (65 FE) MG tablet Take 325 mg by mouth daily with breakfast.   multivitamin with minerals Tabs tablet Take 1 tablet by mouth daily.   OLIVE LEAF EXTRACT PO Take 1 tablet by mouth in the morning and at bedtime.   ondansetron 4 MG tablet Commonly known as: Zofran Take 1 tablet (4 mg total) by mouth every 8 (eight) hours as needed for nausea or  vomiting.   Vitamin D (Ergocalciferol) 1.25 MG (50000 UNIT) Caps capsule Commonly known as: DRISDOL Take 1 capsule (50,000 Units total) by mouth every 7 (seven) days.         Follow-up: Return in about 6 months (around 05/14/2023).  Claretta Fraise, M.D.

## 2022-11-12 NOTE — Patient Instructions (Signed)
Contact Dr. Scarlette Shorts ay [facility] for scheduling your next colonoscopy

## 2022-11-13 LAB — CBC WITH DIFFERENTIAL/PLATELET

## 2022-11-13 LAB — LIPID PANEL
Chol/HDL Ratio: 4.7 ratio (ref 0.0–5.0)
Cholesterol, Total: 147 mg/dL (ref 100–199)
HDL: 31 mg/dL — ABNORMAL LOW (ref >39)
LDL Chol Calc (NIH): 97 mg/dL (ref 0–99)
Triglycerides: 104 mg/dL (ref 0–149)
VLDL Cholesterol Cal: 19 mg/dL (ref 5–40)

## 2022-11-13 LAB — CMP14+EGFR
ALT: 16 IU/L (ref 0–44)
AST: 19 IU/L (ref 0–40)
Albumin/Globulin Ratio: 3 — ABNORMAL HIGH (ref 1.2–2.2)
Albumin: 4.5 g/dL (ref 3.8–4.8)
Alkaline Phosphatase: 90 IU/L (ref 44–121)
BUN/Creatinine Ratio: 20 (ref 10–24)
BUN: 16 mg/dL (ref 8–27)
Bilirubin Total: 0.5 mg/dL (ref 0.0–1.2)
CO2: 24 mmol/L (ref 20–29)
Calcium: 9.1 mg/dL (ref 8.6–10.2)
Chloride: 106 mmol/L (ref 96–106)
Creatinine, Ser: 0.81 mg/dL (ref 0.76–1.27)
Globulin, Total: 1.5 g/dL (ref 1.5–4.5)
Glucose: 93 mg/dL (ref 70–99)
Potassium: 4.4 mmol/L (ref 3.5–5.2)
Sodium: 143 mmol/L (ref 134–144)
Total Protein: 6 g/dL (ref 6.0–8.5)
eGFR: 94 mL/min/{1.73_m2} (ref >59)

## 2022-11-13 LAB — VITAMIN D 25 HYDROXY (VIT D DEFICIENCY, FRACTURES): Vit D, 25-Hydroxy: 30.2 ng/mL (ref 30.0–100.0)

## 2022-11-16 NOTE — Progress Notes (Signed)
Dear Travis Wood, Your Vitamin D is  low. You need a prescription strength supplement I will send that in for you. Nurse, if at all possible, could you send in a prescription for the patient for vitamin D 50,000 units, 1 p.o. weekly #13 with 3 refills? Many thanks, WS

## 2022-11-17 ENCOUNTER — Other Ambulatory Visit: Payer: Self-pay | Admitting: *Deleted

## 2022-11-17 MED ORDER — VITAMIN D (ERGOCALCIFEROL) 1.25 MG (50000 UNIT) PO CAPS: 50000.0000 [IU] | ORAL_CAPSULE | ORAL | 3 refills | Status: DC

## 2022-12-03 ENCOUNTER — Other Ambulatory Visit: Payer: Self-pay | Admitting: Family Medicine

## 2022-12-03 DIAGNOSIS — R918 Other nonspecific abnormal finding of lung field: Secondary | ICD-10-CM

## 2022-12-12 ENCOUNTER — Ambulatory Visit (HOSPITAL_COMMUNITY)
Admission: RE | Admit: 2022-12-12 | Discharge: 2022-12-12 | Disposition: A | Discharge status: Home / Self Care | Payer: Medicare Other | Source: Ambulatory Visit | Attending: Family Medicine | Admitting: Family Medicine

## 2022-12-12 DIAGNOSIS — C9110 Chronic lymphocytic leukemia of B-cell type not having achieved remission: Secondary | ICD-10-CM | POA: Diagnosis present

## 2022-12-12 DIAGNOSIS — R918 Other nonspecific abnormal finding of lung field: Secondary | ICD-10-CM | POA: Insufficient documentation

## 2022-12-12 DIAGNOSIS — R1084 Generalized abdominal pain: Secondary | ICD-10-CM | POA: Diagnosis present

## 2022-12-12 MED ORDER — IOHEXOL 300 MG/ML  SOLN
100.0000 mL | Freq: Once | INTRAMUSCULAR | Status: AC | PRN
  Administered 2022-12-12: 100 mL via INTRAVENOUS

## 2022-12-15 ENCOUNTER — Telehealth: Payer: Self-pay

## 2022-12-15 NOTE — Telephone Encounter (Signed)
Please review patient's recent CT scan.

## 2022-12-16 NOTE — Telephone Encounter (Signed)
Please schedule a time ASAP for him to come in to discuss the test results

## 2022-12-16 NOTE — Telephone Encounter (Signed)
Pt scheduled apt

## 2022-12-17 ENCOUNTER — Ambulatory Visit (INDEPENDENT_AMBULATORY_CARE_PROVIDER_SITE_OTHER): Payer: Medicare Other | Admitting: Family Medicine

## 2022-12-17 ENCOUNTER — Encounter: Payer: Self-pay | Admitting: Family Medicine

## 2022-12-17 VITALS — BP 111/67 | HR 50 | Temp 97.1°F | Ht 75.0 in | Wt 208.0 lb

## 2022-12-17 DIAGNOSIS — C9110 Chronic lymphocytic leukemia of B-cell type not having achieved remission: Secondary | ICD-10-CM

## 2022-12-17 DIAGNOSIS — R918 Other nonspecific abnormal finding of lung field: Secondary | ICD-10-CM | POA: Diagnosis not present

## 2022-12-17 NOTE — Progress Notes (Signed)
   Subjective:    Patient ID: Travis Wood, male    DOB: 1951-11-06, 72 y.o.   MRN: [id number]  HPI  Pt. Seen for review of test results for recent CT chest, CT abd & pelvis. Pt has a history of CLL. Treated at [facility] in past with Dr. Worthy Keeler.      Objective:   CT Chest report reviewed showing Right hilar mass that could be solid or a mass of lymph nodes. Either way it appears wrapped around the right mainstem bronchus. There is also associated mediastinal adenopathy. The right middle lobe has atelectasis associated with obstructed flow via the mass.   CT abdomen shows liver mass with multiple pinpoint lesions surrounding. These are reminiscent of metastases. Additionally the spleenis enlarged to 18 cm.        Assessment & Plan:   Test results reviewed with pt. Along with potential cancer diagnoses.   Pt. Voices understanding. His questions were answered.  Oncology referral written.  1. Chronic lymphocytic leukemia ([place])   2. Mass of right lung       Orders Placed This Encounter  Procedures   Ambulatory referral to Hematology / Oncology    Referral Priority:   Routine    Referral Type:   Consultation    Referral Reason:   Specialty Services Required    Requested Specialty:   Oncology    Number of Visits Requested:   1    Follow up as needed.  Claretta Fraise, MD

## 2022-12-17 NOTE — Progress Notes (Signed)
Pt. Seen for review of test results for recent CT chest, CT abd & pelvis. Pt has a

## 2022-12-29 ENCOUNTER — Inpatient Hospital Stay: Payer: Medicare Other

## 2022-12-29 ENCOUNTER — Encounter: Payer: Self-pay | Admitting: Hematology

## 2022-12-29 ENCOUNTER — Inpatient Hospital Stay: Payer: Medicare Other | Attending: Hematology | Admitting: Hematology

## 2022-12-29 VITALS — BP 122/61 | HR 56 | Temp 97.7°F | Resp 18 | Ht 75.0 in | Wt 199.0 lb

## 2022-12-29 DIAGNOSIS — R161 Splenomegaly, not elsewhere classified: Secondary | ICD-10-CM | POA: Diagnosis not present

## 2022-12-29 DIAGNOSIS — D696 Thrombocytopenia, unspecified: Secondary | ICD-10-CM | POA: Diagnosis not present

## 2022-12-29 DIAGNOSIS — C9110 Chronic lymphocytic leukemia of B-cell type not having achieved remission: Secondary | ICD-10-CM

## 2022-12-29 LAB — COMPREHENSIVE METABOLIC PANEL
ALT: 16 U/L (ref 0–44)
AST: 22 U/L (ref 15–41)
Albumin: 4.4 g/dL (ref 3.5–5.0)
Alkaline Phosphatase: 80 U/L (ref 38–126)
Anion gap: 8 (ref 5–15)
BUN: 23 mg/dL (ref 8–23)
CO2: 26 mmol/L (ref 22–32)
Calcium: 8.6 mg/dL — ABNORMAL LOW (ref 8.9–10.3)
Chloride: 107 mmol/L (ref 98–111)
Creatinine, Ser: 0.87 mg/dL (ref 0.61–1.24)
GFR, Estimated: >60 mL/min (ref >60)
Glucose, Bld: 94 mg/dL (ref 70–99)
Potassium: 4.4 mmol/L (ref 3.5–5.1)
Sodium: 141 mmol/L (ref 135–145)
Total Bilirubin: 0.8 mg/dL (ref 0.3–1.2)
Total Protein: 6.4 g/dL — ABNORMAL LOW (ref 6.5–8.1)

## 2022-12-29 LAB — CBC WITH DIFFERENTIAL/PLATELET
Abs Immature Granulocytes: 0.12 10*3/uL — ABNORMAL HIGH (ref 0.00–0.07)
Basophils Absolute: 0.1 10*3/uL (ref 0.0–0.1)
Basophils Relative: 0 %
Eosinophils Absolute: 0.1 10*3/uL (ref 0.0–0.5)
Eosinophils Relative: 0 %
HCT: 36.7 % — ABNORMAL LOW (ref 39.0–52.0)
Hemoglobin: 11.6 g/dL — ABNORMAL LOW (ref 13.0–17.0)
Immature Granulocytes: 0 %
Lymphocytes Relative: 90 %
Lymphs Abs: 48.3 10*3/uL — ABNORMAL HIGH (ref 0.7–4.0)
MCH: 29.1 pg (ref 26.0–34.0)
MCHC: 31.6 g/dL (ref 30.0–36.0)
MCV: 92.2 fL (ref 80.0–100.0)
Monocytes Absolute: 1.7 10*3/uL — ABNORMAL HIGH (ref 0.1–1.0)
Monocytes Relative: 3 %
Neutro Abs: 3.8 10*3/uL (ref 1.7–7.7)
Neutrophils Relative %: 7 %
Platelets: 79 10*3/uL — ABNORMAL LOW (ref 150–400)
RBC: 3.98 MIL/uL — ABNORMAL LOW (ref 4.22–5.81)
RDW: 14.9 % (ref 11.5–15.5)
Smear Review: DECREASED
WBC: 54 10*3/uL (ref 4.0–10.5)
nRBC: 0.1 % (ref 0.0–0.2)

## 2022-12-29 LAB — RETICULOCYTES
Immature Retic Fract: 13.5 % (ref 2.3–15.9)
RBC.: 3.98 MIL/uL — ABNORMAL LOW (ref 4.22–5.81)
Retic Count, Absolute: 52.1 10*3/uL (ref 19.0–186.0)
Retic Ct Pct: 1.3 % (ref 0.4–3.1)

## 2022-12-29 LAB — HEPATITIS B CORE ANTIBODY, TOTAL: Hep B Core Total Ab: NONREACTIVE

## 2022-12-29 LAB — DIRECT ANTIGLOBULIN TEST (NOT AT ARMC)
DAT, IgG: NEGATIVE
DAT, complement: NEGATIVE

## 2022-12-29 LAB — HEPATITIS B SURFACE ANTIGEN: Hepatitis B Surface Ag: NONREACTIVE

## 2022-12-29 LAB — LACTATE DEHYDROGENASE: LDH: 140 U/L (ref 98–192)

## 2022-12-29 NOTE — Progress Notes (Signed)
[facility] NOTE  Patient Care Team: Derek Jack, MD as PCP - General (Hematology) Derek Jack, MD as Medical Oncologist (Medical Oncology) Brien Mates, RN as Oncology Nurse Navigator (Medical Oncology)  CHIEF COMPLAINTS/PURPOSE OF CONSULTATION:  Generalized lymphadenopathy and CLL  HISTORY OF PRESENTING ILLNESS:  Travis Wood 72 y.o. male is seen in consultation today at the request of Dr. Livia Snellen as his recent CT scan showed mediastinal adenopathy.  Patient reports that he is continuing to play golf 3-4 times per week.  Denies any fevers, night sweats or weight loss.  Denies any infections in the last 1 year.  No bleeding issues although he bruises easily.  He lives at home with his girlfriend and is independent of ADLs and IADLs.  He quit smoking cigarettes in 1992, smoked 1 pack/day for 10 years.  A chest x-ray done on 11/12/2022 showed 8.6 x 6.9 cm mass centered in the right hilum.  CT of the chest high-resolution on 12/12/2022 showed large right hilar mass or bulky adenopathy difficult to clearly distinguish from adjacent structures and lymph nodes although measuring 7.5 x 6.4 cm encasing the right mainstem bronchus and lobar branches.  Additional mediastinal lymphadenopathy as well as axillary and lower cervical lymphadenopathy.  MEDICAL HISTORY:  Past Medical History:  Diagnosis Date   Cataract    CLL (chronic lymphocytic leukemia) (HCC)    Erectile dysfunction    History of bronchitis    History of colon polyps    Hyperlipemia    Osteoarthritis    Shortness of breath dyspnea     SURGICAL HISTORY: Past Surgical History:  Procedure Laterality Date   APPENDECTOMY     CATARACT EXTRACTION W/PHACO Left 10/15/2015   Procedure: CATARACT EXTRACTION PHACO AND INTRAOCULAR LENS PLACEMENT LEFT EYE;  Surgeon: Tonny Branch, MD;  Location: [facility];  Service: Ophthalmology;  Laterality: Left;  CDE:12.20   CATARACT EXTRACTION W/PHACO Right  11/12/2015   Procedure: CATARACT EXTRACTION PHACO AND INTRAOCULAR LENS PLACEMENT RIGHT EYE:  CDE:  8.99;  Surgeon: Tonny Branch, MD;  Location: [facility];  Service: Ophthalmology;  Laterality: Right;   KNEE SURGERY     Left x 2    MASS EXCISION N/A 09/12/2015   Procedure: EXCISION SOFT TISSUE NEOPLASM (6 CM), BACK;  Surgeon: Aviva Signs, MD;  Location: [facility];  Service: General;  Laterality: N/A;   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   Procedure: TOTAL KNEE ARTHROPLASTY;  Surgeon: Paralee Cancel, MD;  Location: [facility];  Service: Orthopedics;  Laterality: Right;  70 mins    SOCIAL HISTORY: Social History   Socioeconomic History   Marital status: Soil scientist    Spouse name: Not on file   Number of children: 2   Years of education: 12 years   Highest education level: 12th grade  Occupational History   Occupation: RETIRED  Tobacco Use   Smoking status: Former    Packs/day: 0.00    Years: 3.00    Total pack years: 0.00    Types: Cigarettes, Pipe    Quit date: 09/10/1991    Years since quitting: 31.3   Smokeless tobacco: Never  Vaping Use   Vaping Use: Never used  Substance and Sexual Activity   Alcohol use: Not Currently    Alcohol/week: 1.0 standard drink of alcohol    Types: 1 Cans of beer per week   Drug use: Yes    Frequency: 1.0 times per week    Types: Marijuana   Sexual activity: Yes  Other Topics Concern   Not on file  Social History Narrative   2 sons who live close by   Lives with girlfriend   Social Determinants of Health   Financial Resource Strain: Low Risk  (11/06/2022)   Overall Financial Resource Strain (CARDIA)    Difficulty of Paying Living Expenses: Not hard at all  Food Insecurity: No Food Insecurity (12/29/2022)   Hunger Vital Sign    Worried About Running Out of Food in the Last Year: Never true    Ran Out of Food in the Last Year: Never true  Transportation Needs: No Transportation Needs (11/06/2022)   PRAPARE - Radiographer, therapeutic (Medical): No    Lack of Transportation (Non-Medical): No  Physical Activity: Sufficiently Active (11/06/2022)   Exercise Vital Sign    Days of Exercise per Week: 7 days    Minutes of Exercise per Session: 30 min  Stress: No Stress Concern Present (11/06/2022)   [place]    Feeling of Stress : Not at all  Social Connections: Socially Integrated (11/06/2022)   Social Connection and Isolation Panel [NHANES]    Frequency of Communication with Friends and Family: More than three times a week    Frequency of Social Gatherings with Friends and Family: More than three times a week    Attends Religious Services: 1 to 4 times per year    Active Member of [employer] or Organizations: Yes    Attends Music therapist: More than 4 times per year    Marital Status: Living with partner  Intimate Partner Violence: Not At Risk (12/29/2022)   Humiliation, Afraid, Rape, and Kick questionnaire    Fear of Current or Ex-Partner: No    Emotionally Abused: No    Physically Abused: No    Sexually Abused: No    FAMILY HISTORY: Family History  Problem Relation Age of Onset   Colon cancer Father        age 89    Colon polyps Father    Heart disease Father        Heart failure   Heart disease Mother    Cancer Sister        Uterine, and ovarian   Hypertension Brother    Obesity Sister    Heart disease Maternal Grandmother    Esophageal cancer Neg Hx    Liver cancer Neg Hx    Pancreatic cancer Neg Hx    Rectal cancer Neg Hx    Stomach cancer Neg Hx     ALLERGIES:  has No Known Allergies.  MEDICATIONS:  Current Outpatient Medications  Medication Sig Dispense Refill   atorvastatin (LIPITOR) 40 MG tablet Take 1 tablet (40 mg total) by mouth daily. 90 tablet 3   ferrous sulfate 325 (65 FE) MG tablet Take 325 mg by mouth daily with breakfast.     Multiple Vitamin (MULTIVITAMIN WITH MINERALS) TABS tablet Take 1  tablet by mouth daily.     OLIVE LEAF EXTRACT PO Take 1 tablet by mouth in the morning and at bedtime.     Vitamin D, Ergocalciferol, (DRISDOL) 1.25 MG (50000 UNIT) CAPS capsule Take 1 capsule (50,000 Units total) by mouth every 7 (seven) days. 13 capsule 3   Vitamin D, Ergocalciferol, (DRISDOL) 1.25 MG (50000 UNIT) CAPS capsule Take 1 capsule (50,000 Units total) by mouth every 7 (seven) days. 13 capsule 3   ondansetron (ZOFRAN) 4 MG tablet Take 1 tablet (  4 mg total) by mouth every 8 (eight) hours as needed for nausea or vomiting. (Patient not taking: Reported on 12/29/2022) 20 tablet 0   No current facility-administered medications for this visit.    REVIEW OF SYSTEMS:   Constitutional: Denies fevers, chills or abnormal night sweats Eyes: Denies blurriness of vision, double vision or watery eyes Ears, nose, mouth, throat, and face: Denies mucositis or sore throat Respiratory: Denies cough, dyspnea or wheezes Cardiovascular: Denies palpitation, chest discomfort or lower extremity swelling Gastrointestinal:  Denies nausea, heartburn or change in bowel habits Skin: Denies abnormal skin rashes Lymphatics: Denies new lymphadenopathy or easy bruising Neurological:Denies numbness, tingling or new weaknesses Behavioral/Psych: Mood is stable, no new changes  All other systems were reviewed with the patient and are negative.  PHYSICAL EXAMINATION: ECOG PERFORMANCE STATUS: 1 - Symptomatic but completely ambulatory  Vitals:   12/29/22 1342  BP: 122/61  Pulse: (!) 56  Resp: 18  Temp: 97.7 F (36.5 C)  SpO2: 97%   Filed Weights   12/29/22 1342  Weight: 199 lb (90.3 kg)    GENERAL:alert, no distress and comfortable SKIN: skin color, texture, turgor are normal, no rashes or significant lesions EYES: normal, conjunctiva are pink and non-injected, sclera clear OROPHARYNX:no exudate, no erythema and lips, buccal mucosa, and tongue normal  NECK: supple, thyroid normal size, non-tender,  without nodularity LYMPH:  no palpable lymphadenopathy in the cervical, axillary or inguinal LUNGS: clear to auscultation and percussion with normal breathing effort HEART: regular rate & rhythm and no murmurs and no lower extremity edema ABDOMEN:abdomen soft, non-tender and normal bowel sounds Musculoskeletal:no cyanosis of digits and no clubbing  PSYCH: alert & oriented x 3 with fluent speech NEURO: no focal motor/sensory deficits  LABORATORY DATA:  I have reviewed the data as listed Lab Results  Component Value Date   WBC 54.0 (HH) 12/29/2022   HGB 11.6 (L) 12/29/2022   HCT 36.7 (L) 12/29/2022   MCV 92.2 12/29/2022   PLT 79 (L) 12/29/2022     Chemistry      Component Value Date/Time   NA 141 12/29/2022 1404   NA 143 11/12/2022 1034   K 4.4 12/29/2022 1404   CL 107 12/29/2022 1404   CO2 26 12/29/2022 1404   BUN 23 12/29/2022 1404   BUN 16 11/12/2022 1034   CREATININE 0.87 12/29/2022 1404      Component Value Date/Time   CALCIUM 8.6 (L) 12/29/2022 1404   ALKPHOS 80 12/29/2022 1404   AST 22 12/29/2022 1404   ALT 16 12/29/2022 1404   BILITOT 0.8 12/29/2022 1404   BILITOT 0.5 11/12/2022 1034       RADIOGRAPHIC STUDIES: I have personally reviewed the radiological images as listed and agreed with the findings in the report. CT ABDOMEN PELVIS W CONTRAST  Result Date: 12/13/2022 CLINICAL DATA:  Abdominal pain, weight loss, abnormal chest radiograph, history of CLL * Tracking Code: BO * EXAM: CT ABDOMEN AND PELVIS WITH CONTRAST TECHNIQUE: Multidetector CT imaging of the abdomen and pelvis was performed using the standard protocol following bolus administration of intravenous contrast. RADIATION DOSE REDUCTION: This exam was performed according to the departmental dose-optimization program which includes automated exposure control, adjustment of the mA and/or kV according to patient size and/or use of iterative reconstruction technique. CONTRAST:  141m OMNIPAQUE IOHEXOL 300  MG/ML  SOLN COMPARISON:  None Available. FINDINGS: Lower chest: Please see separately reported examination of the chest Hepatobiliary: Subcapsular hypodense lesion of the peripheral liver dome, hepatic segment VIII measuring  1.6 x 1.1 cm (series 2, image 16). Additional subcentimeter hypodense lesions, too small to characterize (series 2, image 25, 23). No gallstones, gallbladder wall thickening, or biliary dilatation. Pancreas: Unremarkable. No pancreatic ductal dilatation or surrounding inflammatory changes. Spleen: Severe splenomegaly, maximum coronal span 21.5 cm. Adrenals/Urinary Tract: Adrenal glands are unremarkable. Kidneys are normal, without renal calculi, solid lesion, or hydronephrosis. Bladder is unremarkable. Stomach/Bowel: Stomach is within normal limits. Appendix is surgically absent. No evidence of bowel wall thickening, distention, or inflammatory changes. Sigmoid diverticulosis. Vascular/Lymphatic: Aortic atherosclerosis. Prominent retroperitoneal lymph nodes, left retroperitoneal nodes measuring up to 1.6 x 1.0 cm (series 2, image 36). Reproductive: No mass or other significant abnormality. Other: No abdominal wall hernia or abnormality. No ascites. Musculoskeletal: No acute or significant osseous findings. IMPRESSION: 1. Severe splenomegaly, maximum coronal span 21.5 cm. 2. Prominent retroperitoneal lymph nodes. 3. Above findings are in keeping with patient's known history of CLL. 4. Subcapsular hypodense lesion of the peripheral liver dome, hepatic segment VIII measuring 1.6 x 1.1 cm. Additional subcentimeter hypodense lesions, too small to characterize. Given significant differential consideration of lung malignancy with respect to findings of separately reported examination of the chest, these are worrisome for small metastases and could be further characterized by contrast enhanced MRI pending tissue diagnosis of hilar mass. 5. Sigmoid diverticulosis without evidence of acute diverticulitis.  Aortic Atherosclerosis (ICD10-I70.0). Electronically Signed   By: Delanna Ahmadi M.D.   On: 12/13/2022 16:42   CT CHEST HIGH RESOLUTION  Result Date: 12/13/2022 CLINICAL DATA:  Abnormal chest radiograph, wheezing and cough for 1 month, history of CLL * Tracking Code: BO * EXAM: CT CHEST WITHOUT CONTRAST TECHNIQUE: Multidetector CT imaging of the chest was performed following the standard protocol without intravenous contrast. High resolution imaging of the lungs, as well as inspiratory and expiratory imaging, was performed. RADIATION DOSE REDUCTION: This exam was performed according to the departmental dose-optimization program which includes automated exposure control, adjustment of the mA and/or kV according to patient size and/or use of iterative reconstruction technique. COMPARISON:  None Available. FINDINGS: Cardiovascular: Aortic atherosclerosis. Normal heart size. Three-vessel coronary artery calcifications. No pericardial effusion. Mediastinum/Nodes: Large right hilar mass or bulky lymphadenopathy, difficult to clearly distinguish from adjacent structures and lymph nodes although measuring at least 7.5 x 6.4 cm and encasing the right mainstem bronchus and lobar branches (series 3, image 76). Additional mediastinal lymphadenopathy, pretracheal nodes measuring at least 4.1 x 2.6 cm (series 3, image 70). Numerous additional enlarged bilateral axillary and lower cervical nodes, left axillary nodes measuring up to 2.9 x 1.6 cm (series 3, image 22). Thyroid gland, trachea, and esophagus demonstrate no significant findings. Lungs/Pleura: Volume loss and bandlike scarring or atelectasis of the lateral segment right middle lobe with near occlusion of the segmental right middle lobe bronchi within the right hilum (series 5, image 91). No evidence of fibrotic interstitial lung disease. Lobular air trapping on expiratory phase imaging. No pleural effusion or pneumothorax. Upper Abdomen: Please see separately reported  examination of the abdomen and pelvis. Musculoskeletal: No chest wall abnormality. No acute osseous findings. IMPRESSION: 1. Large right hilar mass or bulky lymphadenopathy, difficult to clearly distinguish from adjacent structures and lymph nodes although measuring at least 7.5 x 6.4 cm and encasing the right mainstem bronchus and lobar branches. 2. Additional mediastinal lymphadenopathy, as well as axillary and lower cervical lymphadenopathy. 3. Findings may reflect transformation of patient's known CLL, or alternately metachronous primary lung malignancy with nodal metastatic disease. 4. Volume loss and bandlike scarring  or atelectasis of the lateral segment right middle lobe with near occlusion of the segmental right middle lobe bronchi within the right hilum. 5. No evidence of fibrotic interstitial lung disease. 6. Lobular air trapping on expiratory phase imaging, consistent with small airways disease. 7. Coronary artery disease. These results will be called to the ordering clinician or representative by the Radiologist Assistant, and communication documented in the PACS or [employer]. Aortic Atherosclerosis (ICD10-I70.0). Electronically Signed   By: Delanna Ahmadi M.D.   On: 12/13/2022 16:38    ASSESSMENT:  1.  Chronic lymphocytic leukemia: - Flow cytometry (01/26/2019): Monoclonal B-cell population consistent with CLL. - CT chest (12/12/2022): Large right hilar mass/bulky adenopathy measuring 7.5 x 6.4 cm encasing the right mainstem bronchus and lobar branches.  Additional mediastinal adenopathy, axillary and lower cervical adenopathy. - CT [facility] (12/12/2022): Severe splenomegaly measuring 21.5 cm.  Prominent retroperitoneal lymph nodes, left retroperitoneal nodes measuring 1.6 x 1.0 cm.  Subcapsular hypodense lesion of the peripheral liver dome measuring 1.6 x 1.1 cm.  Additional subcentimeter hypodense lesions too small to characterize. - No B symptoms.  Patient plays golf 3-4 times per week.  No  recurrent infections.  2.  Social/family history: - Lives at home with his girlfriend.  Independent of ADLs and IADLs. - He retired 8 years ago after working at [employer] in [place].  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  Quit smoking in 1992.  Smoked 1 pack/day for 10 years. - Sister had breast cancer.  Another sister had ovarian and stomach cancer.  Father had colon cancer.  Mother had CLL.  Paternal aunt had lung cancer.  Another paternal aunt had breast cancer.   PLAN:  1.  CLL/lymphadenopathy/splenomegaly: - We reviewed CT images with the patient and his sister. - Recent labs indicate that he developed thrombocytopenia below 100 K. - He has massive splenomegaly measuring up to 8 fingerbreadths below the left costal margin. - Recommend PET CT scan to see if there is any transformation. - Recommend CLL FISH panel, IgHV, T p53 mutation, beta-2 microglobulin, LDH, reticulocyte count, haptoglobin, direct Coombs test, hepatitis B and C serology and quantitative immunoglobulins. - Will evaluate the right hilar mass/bulky adenopathy on the PET scan to see if it needs a biopsy if suspicious of lung origin. - RTC after PET scan.   Orders Placed This Encounter  Procedures   NM PET Image Initial (PI) Skull Base To Thigh    Standing Status:   Future    Standing Expiration Date:   12/29/2023    Order Specific Question:   If indicated for the ordered procedure, I authorize the administration of a radiopharmaceutical per Radiology protocol    Answer:   Yes    Order Specific Question:   Preferred imaging location?    Answer:   [facility]    Order Specific Question:   Release to patient    Answer:   Immediate   IgVH Somatic Hypermutation    Standing Status:   Future    Number of Occurrences:   1    Standing Expiration Date:   12/30/2023   Chronic Lymphocytic Leukemia (CLL) Profile, FISH    Standing Status:   Future    Number of Occurrences:   1    Standing  Expiration Date:   12/30/2023   Beta 2 microglobulin, serum    Standing Status:   Future    Number of Occurrences:   1    Standing Expiration Date:  12/29/2023   Lactate dehydrogenase    Standing Status:   Future    Number of Occurrences:   1    Standing Expiration Date:   12/29/2023   Reticulocytes    Standing Status:   Future    Number of Occurrences:   1    Standing Expiration Date:   12/29/2023   Haptoglobin    Standing Status:   Future    Number of Occurrences:   1    Standing Expiration Date:   12/29/2023   Hepatitis B core antibody, total    Standing Status:   Future    Number of Occurrences:   1    Standing Expiration Date:   12/30/2023   Hepatitis B surface antibody    Standing Status:   Future    Number of Occurrences:   1    Standing Expiration Date:   12/29/2023   Hepatitis B surface antigen    Standing Status:   Future    Number of Occurrences:   1    Standing Expiration Date:   12/29/2023   Hepatitis C Antibody    Standing Status:   Future    Number of Occurrences:   1    Standing Expiration Date:   12/30/2023   Immunoglobulins, QN, A/E/G/M    Standing Status:   Future    Number of Occurrences:   1    Standing Expiration Date:   12/30/2023   Miscellaneous [facility] test (send-out)    Standing Status:   Future    Number of Occurrences:   1    Standing Expiration Date:   12/30/2023    Order Specific Question:   Test name / description:    Answer:   Test #: [id number] p53 Mutation Sequencing    Order Specific Question:   Release to patient    Answer:   Immediate   CBC with Differential    Standing Status:   Future    Number of Occurrences:   1    Standing Expiration Date:   12/29/2023   Comprehensive metabolic panel    Standing Status:   Future    Number of Occurrences:   1    Standing Expiration Date:   12/29/2023   Direct antiglobulin test    Standing Status:   Future    Number of Occurrences:   1    Standing Expiration Date:   12/29/2023    All questions were  answered. The patient knows to call the clinic with any problems, questions or concerns.      Derek Jack, MD 12/29/2022 4:49 PM

## 2022-12-29 NOTE — Patient Instructions (Addendum)
[facility]  Discharge Instructions  You were seen and examined today by Dr. Delton Coombes. Dr. Delton Coombes is a medical oncologist, meaning that he specializes in the treatment of cancer diagnoses. Dr. Delton Coombes discussed your past medical history, family history of cancers, and the events that led to you being here today.  You were referred back to Dr. Delton Coombes due to an abnormal CT scan which revealed enlarged lymph nodes. This could likely be related to your CLL.  Dr. Delton Coombes has recommended a PET scan and additional labs. A PET scan is a specialized CT scan that illuminates cancer presence in the body. This will help decipher the cause of the enlarged lymph nodes.  Follow-up as scheduled.  Thank you for choosing [facility] to provide your oncology and hematology care.   To afford each patient quality time with our provider, please arrive at least 15 minutes before your scheduled appointment time. You may need to reschedule your appointment if you arrive late (10 or more minutes). Arriving late affects you and other patients whose appointments are after yours.  Also, if you miss three or more appointments without notifying the office, you may be dismissed from the clinic at the provider's discretion.    Again, thank you for choosing [facility].  Our hope is that these requests will decrease the amount of time that you wait before being seen by our physicians.   If you have a lab appointment with the [facility] please come in thru the Main Entrance and check in at the main information desk.           _____________________________________________________________  Should you have questions after your visit to [facility], please contact our office at [phone] and follow the prompts.  Our office hours are 8:00 a.m. to 4:30 p.m. Monday - Thursday and 8:00 a.m. to 2:30 p.m. Friday.  Please note that  voicemails left after 4:00 p.m. may not be returned until the following business day.  We are closed weekends and all major holidays.  You do have access to a nurse 24-7, just call the main number to the clinic [phone] and do not press any options, hold on the line and a nurse will answer the phone.    For prescription refill requests, have your pharmacy contact our office and allow 72 hours.    Masks are optional in the cancer centers. If you would like for your care team to wear a mask while they are taking care of you, please let them know. You may have one support person who is at least 72 years old accompany you for your appointments.

## 2022-12-29 NOTE — Progress Notes (Unsigned)
CRITICAL VALUE ALERT Critical value received:  WBC 54,000 Date of notification:  12-29-22 Time of notification: 1600 Critical value read back:  Yes.   Nurse who received alert:  C. [place] Nolton RN MD notified time and response:  [id number], no new orders at this time. Dr. Delton Coombes

## 2022-12-30 LAB — HEPATITIS C ANTIBODY: HCV Ab: NONREACTIVE

## 2022-12-30 LAB — HEPATITIS B SURFACE ANTIBODY,QUALITATIVE: Hep B S Ab: NONREACTIVE

## 2023-01-01 LAB — HAPTOGLOBIN: Haptoglobin: 26 mg/dL — ABNORMAL LOW (ref 34–355)

## 2023-01-01 LAB — IMMUNOGLOBULINS A/E/G/M, SERUM
IgA: 23 mg/dL — ABNORMAL LOW (ref 61–437)
IgE (Immunoglobulin E), Serum: <2 IU/mL — ABNORMAL LOW (ref 6–495)
IgG (Immunoglobin G), Serum: 289 mg/dL — ABNORMAL LOW (ref 603–1613)
IgM (Immunoglobulin M), Srm: 13 mg/dL — ABNORMAL LOW (ref 15–143)

## 2023-01-02 LAB — FISH HES LEUKEMIA, 4Q12 REA
Cells Analyzed: 100
Cells Counted:: 100

## 2023-01-02 LAB — BETA 2 MICROGLOBULIN, SERUM: Beta-2 Microglobulin: 4.6 mg/L — ABNORMAL HIGH (ref 0.6–2.4)

## 2023-01-06 LAB — MISC LABCORP TEST (SEND OUT): Labcorp test code: 489590

## 2023-01-06 LAB — IGVH SOMATIC HYPERMUTATION

## 2023-01-08 ENCOUNTER — Encounter (HOSPITAL_COMMUNITY)
Admission: RE | Admit: 2023-01-08 | Discharge: 2023-01-08 | Disposition: A | Discharge status: Home / Self Care | Payer: Medicare Other | Source: Ambulatory Visit | Attending: Hematology | Admitting: Hematology

## 2023-01-08 DIAGNOSIS — C9110 Chronic lymphocytic leukemia of B-cell type not having achieved remission: Secondary | ICD-10-CM | POA: Diagnosis not present

## 2023-01-08 MED ORDER — FLUDEOXYGLUCOSE F - 18 (FDG) INJECTION
11.0600 | Freq: Once | INTRAVENOUS | Status: AC | PRN
  Administered 2023-01-08: 11.06 via INTRAVENOUS

## 2023-01-15 ENCOUNTER — Inpatient Hospital Stay: Payer: Medicare Other | Attending: Hematology | Admitting: Hematology

## 2023-01-15 VITALS — BP 117/66 | HR 54 | Temp 98.0°F | Resp 17 | Ht 75.0 in | Wt 206.2 lb

## 2023-01-15 DIAGNOSIS — Z87891 Personal history of nicotine dependence: Secondary | ICD-10-CM | POA: Diagnosis not present

## 2023-01-15 DIAGNOSIS — C9110 Chronic lymphocytic leukemia of B-cell type not having achieved remission: Secondary | ICD-10-CM | POA: Diagnosis present

## 2023-01-15 NOTE — Patient Instructions (Addendum)
[place] at [facility] Discharge Instructions   You were seen and examined today by Dr. Delton Coombes.  He reviewed the results of your PET scan. It shows that your spleen is enlarged and there are multiple lymph nodes above and below the diaphragm that are lighting up. The lymph nodes in the lungs are especially bright.   Before we start treatment, Dr. Raliegh Ip wants you to see a lung doctor to put a scope down to your lung to get a biopsy to confirm if this is a type of aggressive lymphoma or a different primary cancer.   We will see you back after the biopsy of the lung mass is done.    Thank you for choosing [place] at [facility] to provide your oncology and hematology care.  To afford each patient quality time with our provider, please arrive at least 15 minutes before your scheduled appointment time.   If you have a lab appointment with the [facility] please come in thru the Main Entrance and check in at the main information desk.  You need to re-schedule your appointment should you arrive 10 or more minutes late.  We strive to give you quality time with our providers, and arriving late affects you and other patients whose appointments are after yours.  Also, if you no show three or more times for appointments you may be dismissed from the clinic at the providers discretion.     Again, thank you for choosing [facility].  Our hope is that these requests will decrease the amount of time that you wait before being seen by our physicians.       _____________________________________________________________  Should you have questions after your visit to [facility], please contact our office at [phone] and follow the prompts.  Our office hours are 8:00 a.m. and 4:30 p.m. Monday - Friday.  Please note that voicemails left after 4:00 p.m. may not be returned until the following business day.  We are closed weekends and  major holidays.  You do have access to a nurse 24-7, just call the main number to the clinic [phone] and do not press any options, hold on the line and a nurse will answer the phone.    For prescription refill requests, have your pharmacy contact our office and allow 72 hours.    Due to Covid, you will need to wear a mask upon entering the hospital. If you do not have a mask, a mask will be given to you at the Main Entrance upon arrival. For doctor visits, patients may have 1 support person age 36 or older with them. For treatment visits, patients can not have anyone with them due to social distancing guidelines and our immunocompromised population.

## 2023-01-15 NOTE — Progress Notes (Signed)
[place] [address] [place] [address]    Clinic Day:  01/15/2023  Referring physician: Claretta Fraise, MD  Patient Care Team: Derek Jack, MD as PCP - General (Hematology) Derek Jack, MD as Medical Oncologist (Medical Oncology) Brien Mates, RN as Oncology Nurse Navigator (Medical Oncology)   ASSESSMENT & PLAN:   Assessment: 1.  Chronic lymphocytic leukemia: - Flow cytometry (01/26/2019): Monoclonal B-cell population consistent with CLL. - CT chest (12/12/2022): Large right hilar mass/bulky adenopathy measuring 7.5 x 6.4 cm encasing the right mainstem bronchus and lobar branches.  Additional mediastinal adenopathy, axillary and lower cervical adenopathy. - CT [facility] (12/12/2022): Severe splenomegaly measuring 21.5 cm.  Prominent retroperitoneal lymph nodes, left retroperitoneal nodes measuring 1.6 x 1.0 cm.  Subcapsular hypodense lesion of the peripheral liver dome measuring 1.6 x 1.1 cm.  Additional subcentimeter hypodense lesions too small to characterize. - No B symptoms.  Patient plays golf 3-4 times per week.  No recurrent infections. - CLL FISH panel with 13 q. deletion - T p53 mutation negative - IGHV hyper mutation present. - PET scan (01/08/2023): Marked right hilar adenopathy with lymph node conglomerate/large lymph node measuring 5 cm with SUV of 12.3.  There is hypermetabolic adenopathy above and below diaphragm with splenomegaly.   2.  Social/family history: - Lives at home with his girlfriend.  Independent of ADLs and IADLs. - He retired 8 years ago after working at [employer] in [place].  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  Quit smoking in 1992.  Smoked 1 pack/day for 10 years. - Sister had breast cancer.  Another sister had ovarian and stomach cancer.  Father had colon cancer.  Mother had CLL.  Paternal aunt had lung cancer.  Another paternal aunt had breast cancer.  Plan: 1.  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  Platelet count is significantly low at 79.  Hemoglobin 11.6 and WBC 54 K. - CLL FISH panel with 13 q. deletion. - We discussed PET scan findings which showed marked right hilar adenopathy with high SUV of 12.3.  This is higher than the rest of the lymph nodes. - Hence have recommended bronchoscopy and biopsy of the right hilar nodes to rule out transformation of lymphoma versus lung primary. - I have also recommended treatment of his CLL with a combination of limited duration venetoclax and obinutuzumab if there is no evidence of transformation on the biopsy. - Will see him back after the bronchoscopy and biopsy to discuss further plan.  No orders of the defined types were placed in this encounter.     Beverly Gust Oliver,acting as a scribe for Derek Jack, MD.,have documented all relevant documentation on the behalf of Derek Jack, MD,as directed by  Derek Jack, MD while in the presence of Derek Jack, MD.   I, Derek Jack MD, have reviewed the above documentation for accuracy and completeness, and I agree with the above.   Travis Wood   2/15/202410:28 AM  CHIEF COMPLAINT:   Diagnosis: Generalized lymphadenopathy and CLL    Cancer Staging  Chronic lymphocytic leukemia ([place]) Staging form: Chronic Lymphocytic Leukemia / Small Lymphocytic Lymphoma, AJCC 8th Edition - Clinical stage from 01/14/2023: Modified Rai Stage IV (Modified Rai risk: High, Lymphocytosis: Present, Adenopathy: Present, Organomegaly: Present, Anemia: Present, Thrombocytopenia: Present) - Unsigned    Prior Therapy: none  Current Therapy:  none   HISTORY OF PRESENT ILLNESS:   Oncology  History   No history exists.     INTERVAL HISTORY:   Travis Wood is a 72 y.o. male presenting to clinic today for follow up of Generalized lymphadenopathy and CLL . He was last seen by me on 12/29/2022  for consultation.  Today, he states that he is doing well overall. His appetite level is at 100%. His energy level is at 75%. He no longer smokes. He only smoked for the course of 9yr.His spleen is very enlarged. He stated that he has good veins.  He is not coughing me than usually. He denies loosing weight and fevers and night sweats.  He also denies any recent infections.   PAST MEDICAL HISTORY:   Past Medical History: Past Medical History:  Diagnosis Date   Cataract    C[place]) ([place]    Erectile dysfunction    History of bronchitis    History of colon polyps    Hyperlipemia    Osteoarthritis    Shortness of breath dyspnea     Surgical History: Past Surgical History:  Procedure Laterality Date   APPENDECTOMY     CATARACT EXTRACTION W/PHACO Left 10/15/2015   Procedure: CATARACT EXTRACTION PHACO AND INTRAOCULAR LENS PLACEMENT LEFT EYE;  Surgeon: KTonny Branch MD;  Location: [facility];  Service: Ophthalmology;  Laterality: Left;  CDE:12.20   CATARACT EXTRACTION W/PHACO Right 11/12/2015   Procedure: CATARACT EXTRACTION PHACO AND INTRAOCULAR LENS PLACEMENT RIGHT EYE:  CDE:  8.99;  Surgeon: KTonny Branch MD;  Location: [facility];  Service: Ophthalmology;  Laterality: Right;   KNEE SURGERY     Left x 2    MASS EXCISION N/A 09/12/2015   Procedure: EXCISION SOFT TISSUE NEOPLASM (6 CM), BACK;  Surgeon: MAviva Signs MD;  Location: [facility];  Service: General;  Laterality: N/A;   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   Procedure: TOTAL KNEE ARTHROPLASTY;  Surgeon: OParalee Cancel MD;  Location: [facility];  Service: Orthopedics;  Laterality: Right;  70 mins    Social History: Social History   Socioeconomic History   Marital status: DSoil scientist   Spouse name: Not on file   Number of children: 2   Years of education: 12 years   Highest education level: 12th grade  Occupational History   Occupation: RETIRED  Tobacco Use   Smoking status: Former    Packs/day:  0.00    Years: 3.00    Total pack years: 0.00    Types: Cigarettes, Pipe    Quit date: 09/10/1991    Years since quitting: 31.3   Smokeless tobacco: Never  Vaping Use   Vaping Use: Never used  Substance and Sexual Activity   Alcohol use: Not Currently    Alcohol/week: 1.0 standard drink of alcohol    Types: 1 Cans of beer per week   Drug use: Yes    Frequency: 1.0 times per week    Types: Marijuana   Sexual activity: Yes  Other Topics Concern   Not on file  Social History Narrative   2 sons who live close by   Lives with girlfriend   Social Determinants of Health   Financial Resource Strain: Low Risk  (11/06/2022)   Overall Financial Resource Strain (CARDIA)    Difficulty of Paying Living Expenses: Not hard at all  Food Insecurity: No Food Insecurity (12/29/2022)   Hunger Vital Sign    Worried About Running Out of Food in the Last Year: Never true    RTyheein the Last  Year: Never true  Transportation Needs: No Transportation Needs (11/06/2022)   PRAPARE - Hydrologist (Medical): No    Lack of Transportation (Non-Medical): No  Physical Activity: Sufficiently Active (11/06/2022)   Exercise Vital Sign    Days of Exercise per Week: 7 days    Minutes of Exercise per Session: 30 min  Stress: No Stress Concern Present (11/06/2022)   [place]    Feeling of Stress : Not at all  Social Connections: Socially Integrated (11/06/2022)   Social Connection and Isolation Panel [NHANES]    Frequency of Communication with Friends and Family: More than three times a week    Frequency of Social Gatherings with Friends and Family: More than three times a week    Attends Religious Services: 1 to 4 times per year    Active Member of [employer] or Organizations: Yes    Attends Music therapist: More than 4 times per year    Marital Status: Living with partner  Intimate Partner  Violence: Not At Risk (12/29/2022)   Humiliation, Afraid, Rape, and Kick questionnaire    Fear of Current or Ex-Partner: No    Emotionally Abused: No    Physically Abused: No    Sexually Abused: No    Family History: Family History  Problem Relation Age of Onset   Colon cancer Father        age 1    Colon polyps Father    Heart disease Father        Heart failure   Heart disease Mother    Cancer Sister        Uterine, and ovarian   Hypertension Brother    Obesity Sister    Heart disease Maternal Grandmother    Esophageal cancer Neg Hx    Liver cancer Neg Hx    Pancreatic cancer Neg Hx    Rectal cancer Neg Hx    Stomach cancer Neg Hx     Current Medications:  Current Outpatient Medications:    atorvastatin (LIPITOR) 40 MG tablet, Take 1 tablet (40 mg total) by mouth daily., Disp: 90 tablet, Rfl: 3   ferrous sulfate 325 (65 FE) MG tablet, Take 325 mg by mouth daily with breakfast., Disp: , Rfl:    Multiple Vitamin (MULTIVITAMIN WITH MINERALS) TABS tablet, Take 1 tablet by mouth daily., Disp: , Rfl:    OLIVE LEAF EXTRACT PO, Take 1 tablet by mouth in the morning and at bedtime., Disp: , Rfl:    ondansetron (ZOFRAN) 4 MG tablet, Take 1 tablet (4 mg total) by mouth every 8 (eight) hours as needed for nausea or vomiting., Disp: 20 tablet, Rfl: 0   Vitamin D, Ergocalciferol, (DRISDOL) 1.25 MG (50000 UNIT) CAPS capsule, Take 1 capsule (50,000 Units total) by mouth every 7 (seven) days., Disp: 13 capsule, Rfl: 3   Vitamin D, Ergocalciferol, (DRISDOL) 1.25 MG (50000 UNIT) CAPS capsule, Take 1 capsule (50,000 Units total) by mouth every 7 (seven) days., Disp: 13 capsule, Rfl: 3   Allergies: No Known Allergies  REVIEW OF SYSTEMS:   Review of Systems  Constitutional:  Negative for chills, fatigue and fever.  HENT:   Negative for lump/mass, mouth sores, nosebleeds, sore throat and trouble swallowing.   Eyes:  Negative for eye problems.  Respiratory:  Negative for cough and  shortness of breath.   Cardiovascular:  Negative for chest pain, leg swelling and palpitations.  Gastrointestinal:  Positive for  diarrhea. Negative for abdominal pain, constipation, nausea and vomiting.  Genitourinary:  Negative for bladder incontinence, difficulty urinating, dysuria, frequency, hematuria and nocturia.   Musculoskeletal:  Negative for arthralgias, back pain, flank pain, myalgias and neck pain.  Skin:  Negative for itching and rash.  Neurological:  Negative for dizziness, headaches and numbness.  Hematological:  Does not bruise/bleed easily.  Psychiatric/Behavioral:  Negative for depression, sleep disturbance and suicidal ideas. The patient is not nervous/anxious.   All other systems reviewed and are negative.    VITALS:   Blood pressure 117/66, pulse (!) 54, temperature 98 F (36.7 C), temperature source Oral, resp. rate 17, height 6' 3"$  (1.905 m), weight 206 lb 3.2 oz (93.5 kg), SpO2 99 %.  Wt Readings from Last 3 Encounters:  01/15/23 206 lb 3.2 oz (93.5 kg)  12/29/22 199 lb (90.3 kg)  12/17/22 208 lb (94.3 kg)    Body mass index is 25.77 kg/m.  Performance status (ECOG): 1 - Symptomatic but completely ambulatory  PHYSICAL EXAM:   Physical Exam Vitals reviewed.  Constitutional:      Appearance: Normal appearance.  Neurological:     Mental Status: He is alert.  Psychiatric:        Mood and Affect: Mood normal.        Behavior: Behavior normal.     LABS:      Latest Ref Rng & Units 12/29/2022    2:04 PM 11/12/2022   10:34 AM 03/17/2022    2:21 PM  CBC  WBC 4.0 - 10.5 K/uL 54.0  CANCELED  34.7   Hemoglobin 13.0 - 17.0 g/dL 11.6  CANCELED  12.1   Hematocrit 39.0 - 52.0 % 36.7  CANCELED  36.2   Platelets 150 - 400 K/uL 79  CANCELED  79       Latest Ref Rng & Units 12/29/2022    2:04 PM 11/12/2022   10:34 AM 03/17/2022    2:21 PM  CMP  Glucose 70 - 99 mg/dL 94  93  89   BUN 8 - 23 mg/dL 23  16  15   $ Creatinine 0.61 - 1.24 mg/dL 0.87  0.81  1.02    Sodium 135 - 145 mmol/L 141  143  146   Potassium 3.5 - 5.1 mmol/L 4.4  4.4  5.3   Chloride 98 - 111 mmol/L 107  106  108   CO2 22 - 32 mmol/L 26  24  26   $ Calcium 8.9 - 10.3 mg/dL 8.6  9.1  9.2   Total Protein 6.5 - 8.1 g/dL 6.4  6.0  5.9   Total Bilirubin 0.3 - 1.2 mg/dL 0.8  0.5  0.5   Alkaline Phos 38 - 126 U/L 80  90  98   AST 15 - 41 U/L 22  19  20   $ ALT 0 - 44 U/L 16  16  17      $ No results found for: "CEA1", "CEA" / No results found for: "CEA1", "CEA" Lab Results  Component Value Date   PSA1 0.6 06/06/2020   No results found for: "[id number]" No results found for: "CAN125"  Lab Results  Component Value Date   TOTALPROTELP 6.0 01/26/2019   ALBUMINELP 3.9 01/26/2019   A1GS 0.2 01/26/2019   A2GS 0.5 01/26/2019   BETS 0.9 01/26/2019   GAMS 0.5 01/26/2019   MSPIKE Not Observed 01/26/2019   SPEI Comment 01/26/2019   Lab Results  Component Value Date   TIBC 310 08/21/2021   FERRITIN  421 (H) 08/21/2021   IRONPCTSAT 20 08/21/2021   Lab Results  Component Value Date   LDH 140 12/29/2022   LDH 143 10/01/2020   LDH 123 01/26/2019     STUDIES:   NM PET Image Initial (PI) Skull Base To Thigh  Result Date: 01/09/2023 CLINICAL DATA:  Initial treatment strategy for chronic lymphocytic leukemia. EXAM: NUCLEAR MEDICINE PET SKULL BASE TO THIGH TECHNIQUE: 11.1 mCi F-18 FDG was injected intravenously. Full-ring PET imaging was performed from the skull base to thigh after the radiotracer. CT data was obtained and used for attenuation correction and anatomic localization. Fasting blood glucose: 104 mg/dl COMPARISON:  CT December 12, 2022 FINDINGS: Mediastinal blood pool activity: SUV max 1.7 Liver activity: SUV max 2.5 NECK: Hypermetabolic bilateral cervical and supraclavicular adenopathy. For reference: -right level 2 cervical lymph node measures 11 mm in short axis on image 40/3 with a max SUV of 6.5. Hypermetabolic tonsillar hyperplasia, asymmetric to the left with a max SUV of 6.0  Incidental CT findings: None. CHEST: Marked right hilar adenopathy for instance a lymph node conglomerate/large lymph node measures 5 cm on image 105/3 with a max SUV of 12.3. Hypermetabolic bilateral axillary, subpectoral, and mediastinal lymph nodes. -left axillary lymph node measures 15 mm in short axis on image 87/3 with a max SUV of 6.1 -high right paratracheal lymph node measures 19 mm in short axis on image 87/3 with a max SUV of 9.5 Incidental CT findings: Aortic atherosclerosis. Narrowing of the right lobar bronchi particularly the right middle and upper lobe bronchi by the right hilar adenopathy. ABDOMEN/PELVIS: Minimally metabolic abdominal retroperitoneal lymph nodes. For reference: Left periaortic lymph node at the level of the renal hilum measures 11 mm in short axis on image 177/3 with a max SUV of 2.0. Hypermetabolic iliac side chain, pelvic sidewall and inguinal lymph nodes. For reference: -left external iliac lymph node measures 12 mm in short axis on image 240/3 with a max SUV of 6.1. -right external iliac lymph node measures 2.2 cm in short axis on image 240/3 with a max SUV of 3.3. -left inguinal lymph node measures 18 mm on image 264/3 with a max SUV of 6.9 Splenomegaly with uptake similar to that of background liver without focality, max SUV of 2.6 Incidental CT findings: Hypodense hepatic lesion in the peripheral hepatic dome measuring 16 mm on image 131/3 is photopenic and likely reflects a cyst. Diffuse bladder wall thickening. Colonic diverticulosis without findings of acute diverticulitis. Aortic atherosclerosis. SKELETON: No focal hypermetabolic activity to suggest skeletal metastasis. Incidental CT findings: Multilevel degenerative change of the spine with mild multifocal degenerative joint disease. IMPRESSION: 1. Hypermetabolic adenopathy above and below the diaphragm with splenomegaly, compatible with patient's known lymphocytic leukemia. 2. Diffuse bladder wall thickening. 3. Colonic  diverticulosis without findings of acute diverticulitis. 4.  Aortic Atherosclerosis (ICD10-I70.0). Electronically Signed   By: Dahlia Bailiff M.D.   On: 01/09/2023 09:13

## 2023-01-26 ENCOUNTER — Encounter: Payer: Self-pay | Admitting: Pulmonary Disease

## 2023-01-26 ENCOUNTER — Ambulatory Visit: Payer: Medicare Other | Admitting: Pulmonary Disease

## 2023-01-26 VITALS — BP 110/52 | HR 48 | Ht 75.0 in | Wt 199.8 lb

## 2023-01-26 DIAGNOSIS — R918 Other nonspecific abnormal finding of lung field: Secondary | ICD-10-CM

## 2023-01-26 NOTE — Progress Notes (Signed)
Synopsis: Referred in Feb 2024 for lung mass by Derek Jack, MD  Subjective:   PATIENT ID: Travis Wood GENDER: male DOB: 08-02-1951, MRN: [id number]  Chief Complaint  Patient presents with   Consult    Lung mass.    72 yo M, PMH tobacco use, 10 years, quit in 1990s, currently has CLL.  Patient had follow-up CT imaging that showed bulky adenopathy within the chest.  Patient was referred for consideration of bronchoscopy and videobronchoscopy with endobronchial ultrasound and needle aspiration.  We talked about this today in the office.  He is referred from Dr. Raliegh Ip.  Patient had nuclear medicine pet imaging complete which shows hypermetabolic uptake within station 7.      Past Medical History:  Diagnosis Date   Cataract    CLL (chronic lymphocytic leukemia) (HCC)    Erectile dysfunction    History of bronchitis    History of colon polyps    Hyperlipemia    Osteoarthritis    Shortness of breath dyspnea      Family History  Problem Relation Age of Onset   Colon cancer Father        age 31    Colon polyps Father    Heart disease Father        Heart failure   Heart disease Mother    Cancer Sister        Uterine, and ovarian   Hypertension Brother    Obesity Sister    Heart disease Maternal Grandmother    Esophageal cancer Neg Hx    Liver cancer Neg Hx    Pancreatic cancer Neg Hx    Rectal cancer Neg Hx    Stomach cancer Neg Hx      Past Surgical History:  Procedure Laterality Date   APPENDECTOMY     CATARACT EXTRACTION W/PHACO Left 10/15/2015   Procedure: CATARACT EXTRACTION PHACO AND INTRAOCULAR LENS PLACEMENT LEFT EYE;  Surgeon: Tonny Branch, MD;  Location: [facility];  Service: Ophthalmology;  Laterality: Left;  CDE:12.20   CATARACT EXTRACTION W/PHACO Right 11/12/2015   Procedure: CATARACT EXTRACTION PHACO AND INTRAOCULAR LENS PLACEMENT RIGHT EYE:  CDE:  8.99;  Surgeon: Tonny Branch, MD;  Location: [facility];  Service: Ophthalmology;  Laterality: Right;   KNEE  SURGERY     Left x 2    MASS EXCISION N/A 09/12/2015   Procedure: EXCISION SOFT TISSUE NEOPLASM (6 CM), BACK;  Surgeon: Aviva Signs, MD;  Location: [facility];  Service: General;  Laterality: N/A;   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   Procedure: TOTAL KNEE ARTHROPLASTY;  Surgeon: Paralee Cancel, MD;  Location: [facility];  Service: Orthopedics;  Laterality: Right;  70 mins    Social History   Socioeconomic History   Marital status: Soil scientist    Spouse name: Not on file   Number of children: 2   Years of education: 12 years   Highest education level: 12th grade  Occupational History   Occupation: RETIRED  Tobacco Use   Smoking status: Former    Packs/day: 0.00    Years: 3.00    Total pack years: 0.00    Types: Cigarettes, Pipe    Quit date: 09/10/1991    Years since quitting: 31.4   Smokeless tobacco: Never  Vaping Use   Vaping Use: Never used  Substance and Sexual Activity   Alcohol use: Not Currently    Alcohol/week: 1.0 standard drink of alcohol    Types: 1 Cans of beer per week  Drug use: Yes    Frequency: 1.0 times per week    Types: Marijuana   Sexual activity: Yes  Other Topics Concern   Not on file  Social History Narrative   2 sons who live close by   Lives with girlfriend   Social Determinants of Health   Financial Resource Strain: Low Risk  (11/06/2022)   Overall Financial Resource Strain (CARDIA)    Difficulty of Paying Living Expenses: Not hard at all  Food Insecurity: No Food Insecurity (12/29/2022)   Hunger Vital Sign    Worried About Running Out of Food in the Last Year: Never true    Ran Out of Food in the Last Year: Never true  Transportation Needs: No Transportation Needs (11/06/2022)   PRAPARE - Hydrologist (Medical): No    Lack of Transportation (Non-Medical): No  Physical Activity: Sufficiently Active (11/06/2022)   Exercise Vital Sign    Days of Exercise per Week: 7 days    Minutes of Exercise per Session:  30 min  Stress: No Stress Concern Present (11/06/2022)   [place]    Feeling of Stress : Not at all  Social Connections: [place] (11/06/2022)   Social Connection and Isolation Panel [NHANES]    Frequency of Communication with Friends and Family: More than three times a week    Frequency of Social Gatherings with Friends and Family: More than three times a week    Attends Religious Services: 1 to 4 times per year    Active Member of [employer] or Organizations: Yes    Attends Music therapist: More than 4 times per year    Marital Status: Living with partner  Intimate Partner Violence: Not At Risk (12/29/2022)   Humiliation, Afraid, Rape, and Kick questionnaire    Fear of Current or Ex-Partner: No    Emotionally Abused: No    Physically Abused: No    Sexually Abused: No     No Known Allergies   Outpatient Medications Prior to Visit  Medication Sig Dispense Refill   atorvastatin (LIPITOR) 40 MG tablet Take 1 tablet (40 mg total) by mouth daily. 90 tablet 3   ferrous sulfate 325 (65 FE) MG tablet Take 325 mg by mouth daily with breakfast.     Multiple Vitamin (MULTIVITAMIN WITH MINERALS) TABS tablet Take 1 tablet by mouth daily.     OLIVE LEAF EXTRACT PO Take 1 tablet by mouth in the morning and at bedtime.     ondansetron (ZOFRAN) 4 MG tablet Take 1 tablet (4 mg total) by mouth every 8 (eight) hours as needed for nausea or vomiting. 20 tablet 0   Vitamin D, Ergocalciferol, (DRISDOL) 1.25 MG (50000 UNIT) CAPS capsule Take 1 capsule (50,000 Units total) by mouth every 7 (seven) days. 13 capsule 3   Vitamin D, Ergocalciferol, (DRISDOL) 1.25 MG (50000 UNIT) CAPS capsule Take 1 capsule (50,000 Units total) by mouth every 7 (seven) days. 13 capsule 3   No facility-administered medications prior to visit.    Review of Systems  Constitutional:  Negative for chills, fever, malaise/fatigue and weight loss.   HENT:  Negative for hearing loss, sore throat and tinnitus.   Eyes:  Negative for blurred vision and double vision.  Respiratory:  Negative for cough, hemoptysis, sputum production, shortness of breath, wheezing and stridor.   Cardiovascular:  Negative for chest pain, palpitations, orthopnea, leg swelling and PND.  Gastrointestinal:  Negative for  abdominal pain, constipation, diarrhea, heartburn, nausea and vomiting.  Genitourinary:  Negative for dysuria, hematuria and urgency.  Musculoskeletal:  Negative for joint pain and myalgias.  Skin:  Negative for itching and rash.  Neurological:  Negative for dizziness, tingling, weakness and headaches.  Endo/Heme/Allergies:  Negative for environmental allergies. Does not bruise/bleed easily.  Psychiatric/Behavioral:  Negative for depression. The patient is not nervous/anxious and does not have insomnia.   All other systems reviewed and are negative.    Objective:  Physical Exam Vitals reviewed.  Constitutional:      General: He is not in acute distress.    Appearance: He is well-developed.  HENT:     Head: Normocephalic and atraumatic.  Eyes:     General: No scleral icterus.    Conjunctiva/sclera: Conjunctivae normal.     Pupils: Pupils are equal, round, and reactive to light.  Neck:     Vascular: No JVD.     Trachea: No tracheal deviation.  Cardiovascular:     Rate and Rhythm: Normal rate and regular rhythm.     Heart sounds: Normal heart sounds. No murmur heard. Pulmonary:     Effort: Pulmonary effort is normal. No tachypnea, accessory muscle usage or respiratory distress.     Breath sounds: No stridor. No wheezing, rhonchi or rales.  Abdominal:     General: There is no distension.     Palpations: Abdomen is soft.     Tenderness: There is no abdominal tenderness.  Musculoskeletal:        General: No tenderness.     Cervical back: Neck supple.  Lymphadenopathy:     Cervical: No cervical adenopathy.  Skin:    General: Skin is  warm and dry.     Capillary Refill: Capillary refill takes less than 2 seconds.     Findings: No rash.  Neurological:     Mental Status: He is alert and oriented to person, place, and time.  Psychiatric:        Behavior: Behavior normal.      Vitals:   01/26/23 1343  BP: (!) 110/52  Pulse: (!) 48  SpO2: 96%  Weight: 199 lb 12.8 oz (90.6 kg)  Height: '6\' 3"'$  (1.905 m)   96% on RA BMI Readings from Last 3 Encounters:  01/26/23 24.97 kg/m  01/15/23 25.77 kg/m  12/29/22 24.87 kg/m   Wt Readings from Last 3 Encounters:  01/26/23 199 lb 12.8 oz (90.6 kg)  01/15/23 206 lb 3.2 oz (93.5 kg)  12/29/22 199 lb (90.3 kg)     CBC    Component Value Date/Time   WBC 54.0 (HH) 12/29/2022 1404   RBC 3.98 (L) 12/29/2022 1404   RBC 3.98 (L) 12/29/2022 1404   HGB 11.6 (L) 12/29/2022 1404   HGB CANCELED 11/12/2022 1034   HCT 36.7 (L) 12/29/2022 1404   HCT CANCELED 11/12/2022 1034   PLT 79 (L) 12/29/2022 1404   PLT CANCELED 11/12/2022 1034   MCV 92.2 12/29/2022 1404   MCV CANCELED 11/12/2022 1034   MCH 29.1 12/29/2022 1404   MCHC 31.6 12/29/2022 1404   RDW 14.9 12/29/2022 1404   RDW CANCELED 11/12/2022 1034   LYMPHSABS 48.3 (H) 12/29/2022 1404   LYMPHSABS CANCELED 11/12/2022 1034   MONOABS 1.7 (H) 12/29/2022 1404   EOSABS 0.1 12/29/2022 1404   EOSABS CANCELED 11/12/2022 1034   BASOSABS 0.1 12/29/2022 1404   BASOSABS CANCELED 11/12/2022 1034    Chest Imaging:  01/08/2023 nuclear medicine pet imaging: Hypermetabolic adenopathy as well as hilar  mass within the right lung. The patient's images have been independently reviewed by me.    Pulmonary Functions Testing Results:     No data to display          FeNO:   Pathology:   Echocardiogram:   Heart Catheterization:     Assessment & Plan:     ICD-10-CM   1. Right lower lobe lung mass  R91.8 Procedural/ Surgical Case Request: VIDEO BRONCHOSCOPY WITH ENDOBRONCHIAL ULTRASOUND    Ambulatory referral to Pulmonology     2. Hilar mass  R91.8       Discussion:  This is a 72 year old, former smoker he does have CLL.  Has been managed for this for several years.  Plan: Patient with a hypermetabolic mass and we discussed utility of bronchoscopy with tissue sampling. Patient is agreeable to proceed. He is not on any blood thinners or antiplatelets. We talked about the risk of bleeding and pneumothorax. We will plan for videobronchoscopy with endobronchial ultrasound and TBNA.  We are working on a timely fashion to get this procedure completed. Potentially sometime this week at the latest would be on 02/10/2023.    Current Outpatient Medications:    atorvastatin (LIPITOR) 40 MG tablet, Take 1 tablet (40 mg total) by mouth daily., Disp: 90 tablet, Rfl: 3   ferrous sulfate 325 (65 FE) MG tablet, Take 325 mg by mouth daily with breakfast., Disp: , Rfl:    Multiple Vitamin (MULTIVITAMIN WITH MINERALS) TABS tablet, Take 1 tablet by mouth daily., Disp: , Rfl:    OLIVE LEAF EXTRACT PO, Take 1 tablet by mouth in the morning and at bedtime., Disp: , Rfl:    ondansetron (ZOFRAN) 4 MG tablet, Take 1 tablet (4 mg total) by mouth every 8 (eight) hours as needed for nausea or vomiting., Disp: 20 tablet, Rfl: 0   Vitamin D, Ergocalciferol, (DRISDOL) 1.25 MG (50000 UNIT) CAPS capsule, Take 1 capsule (50,000 Units total) by mouth every 7 (seven) days., Disp: 13 capsule, Rfl: 3   Vitamin D, Ergocalciferol, (DRISDOL) 1.25 MG (50000 UNIT) CAPS capsule, Take 1 capsule (50,000 Units total) by mouth every 7 (seven) days., Disp: 13 capsule, Rfl: 3  I spent 63 minutes dedicated to the care of this patient on the date of this encounter to include pre-visit review of records, face-to-face time with the patient discussing conditions above, post visit ordering of testing, clinical documentation with the electronic health record, making appropriate referrals as documented, and communicating necessary findings to members of the patients  care team.   Garner Nash, [facility] 01/26/2023 2:26 PM

## 2023-01-26 NOTE — H&P (View-Only) (Signed)
Synopsis: Referred in Feb 2024 for lung mass by Derek Jack, MD  Subjective:   PATIENT ID: Travis Wood GENDER: male DOB: 1951-06-28, MRN: [id number]  Chief Complaint  Patient presents with   Consult    Lung mass.    72 yo M, PMH tobacco use, 10 years, quit in 1990s, currently has CLL.  Patient had follow-up CT imaging that showed bulky adenopathy within the chest.  Patient was referred for consideration of bronchoscopy and videobronchoscopy with endobronchial ultrasound and needle aspiration.  We talked about this today in the office.  He is referred from Dr. Raliegh Ip.  Patient had nuclear medicine pet imaging complete which shows hypermetabolic uptake within station 7.      Past Medical History:  Diagnosis Date   Cataract    CLL (chronic lymphocytic leukemia) (HCC)    Erectile dysfunction    History of bronchitis    History of colon polyps    Hyperlipemia    Osteoarthritis    Shortness of breath dyspnea      Family History  Problem Relation Age of Onset   Colon cancer Father        age 49    Colon polyps Father    Heart disease Father        Heart failure   Heart disease Mother    Cancer Sister        Uterine, and ovarian   Hypertension Brother    Obesity Sister    Heart disease Maternal Grandmother    Esophageal cancer Neg Hx    Liver cancer Neg Hx    Pancreatic cancer Neg Hx    Rectal cancer Neg Hx    Stomach cancer Neg Hx      Past Surgical History:  Procedure Laterality Date   APPENDECTOMY     CATARACT EXTRACTION W/PHACO Left 10/15/2015   Procedure: CATARACT EXTRACTION PHACO AND INTRAOCULAR LENS PLACEMENT LEFT EYE;  Surgeon: Tonny Branch, MD;  Location: [facility];  Service: Ophthalmology;  Laterality: Left;  CDE:12.20   CATARACT EXTRACTION W/PHACO Right 11/12/2015   Procedure: CATARACT EXTRACTION PHACO AND INTRAOCULAR LENS PLACEMENT RIGHT EYE:  CDE:  8.99;  Surgeon: Tonny Branch, MD;  Location: [facility];  Service: Ophthalmology;  Laterality: Right;   KNEE  SURGERY     Left x 2    MASS EXCISION N/A 09/12/2015   Procedure: EXCISION SOFT TISSUE NEOPLASM (6 CM), BACK;  Surgeon: Aviva Signs, MD;  Location: [facility];  Service: General;  Laterality: N/A;   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   Procedure: TOTAL KNEE ARTHROPLASTY;  Surgeon: Paralee Cancel, MD;  Location: [facility];  Service: Orthopedics;  Laterality: Right;  70 mins    Social History   Socioeconomic History   Marital status: Soil scientist    Spouse name: Not on file   Number of children: 2   Years of education: 12 years   Highest education level: 12th grade  Occupational History   Occupation: RETIRED  Tobacco Use   Smoking status: Former    Packs/day: 0.00    Years: 3.00    Total pack years: 0.00    Types: Cigarettes, Pipe    Quit date: 09/10/1991    Years since quitting: 31.4   Smokeless tobacco: Never  Vaping Use   Vaping Use: Never used  Substance and Sexual Activity   Alcohol use: Not Currently    Alcohol/week: 1.0 standard drink of alcohol    Types: 1 Cans of beer per week  Drug use: Yes    Frequency: 1.0 times per week    Types: Marijuana   Sexual activity: Yes  Other Topics Concern   Not on file  Social History Narrative   2 sons who live close by   Lives with girlfriend   Social Determinants of Health   Financial Resource Strain: Low Risk  (11/06/2022)   Overall Financial Resource Strain (CARDIA)    Difficulty of Paying Living Expenses: Not hard at all  Food Insecurity: No Food Insecurity (12/29/2022)   Hunger Vital Sign    Worried About Running Out of Food in the Last Year: Never true    Ran Out of Food in the Last Year: Never true  Transportation Needs: No Transportation Needs (11/06/2022)   PRAPARE - Hydrologist (Medical): No    Lack of Transportation (Non-Medical): No  Physical Activity: Sufficiently Active (11/06/2022)   Exercise Vital Sign    Days of Exercise per Week: 7 days    Minutes of Exercise per Session:  30 min  Stress: No Stress Concern Present (11/06/2022)   [place]    Feeling of Stress : Not at all  Social Connections: [place] (11/06/2022)   Social Connection and Isolation Panel [NHANES]    Frequency of Communication with Friends and Family: More than three times a week    Frequency of Social Gatherings with Friends and Family: More than three times a week    Attends Religious Services: 1 to 4 times per year    Active Member of [employer] or Organizations: Yes    Attends Music therapist: More than 4 times per year    Marital Status: Living with partner  Intimate Partner Violence: Not At Risk (12/29/2022)   Humiliation, Afraid, Rape, and Kick questionnaire    Fear of Current or Ex-Partner: No    Emotionally Abused: No    Physically Abused: No    Sexually Abused: No     No Known Allergies   Outpatient Medications Prior to Visit  Medication Sig Dispense Refill   atorvastatin (LIPITOR) 40 MG tablet Take 1 tablet (40 mg total) by mouth daily. 90 tablet 3   ferrous sulfate 325 (65 FE) MG tablet Take 325 mg by mouth daily with breakfast.     Multiple Vitamin (MULTIVITAMIN WITH MINERALS) TABS tablet Take 1 tablet by mouth daily.     OLIVE LEAF EXTRACT PO Take 1 tablet by mouth in the morning and at bedtime.     ondansetron (ZOFRAN) 4 MG tablet Take 1 tablet (4 mg total) by mouth every 8 (eight) hours as needed for nausea or vomiting. 20 tablet 0   Vitamin D, Ergocalciferol, (DRISDOL) 1.25 MG (50000 UNIT) CAPS capsule Take 1 capsule (50,000 Units total) by mouth every 7 (seven) days. 13 capsule 3   Vitamin D, Ergocalciferol, (DRISDOL) 1.25 MG (50000 UNIT) CAPS capsule Take 1 capsule (50,000 Units total) by mouth every 7 (seven) days. 13 capsule 3   No facility-administered medications prior to visit.    Review of Systems  Constitutional:  Negative for chills, fever, malaise/fatigue and weight loss.   HENT:  Negative for hearing loss, sore throat and tinnitus.   Eyes:  Negative for blurred vision and double vision.  Respiratory:  Negative for cough, hemoptysis, sputum production, shortness of breath, wheezing and stridor.   Cardiovascular:  Negative for chest pain, palpitations, orthopnea, leg swelling and PND.  Gastrointestinal:  Negative for  abdominal pain, constipation, diarrhea, heartburn, nausea and vomiting.  Genitourinary:  Negative for dysuria, hematuria and urgency.  Musculoskeletal:  Negative for joint pain and myalgias.  Skin:  Negative for itching and rash.  Neurological:  Negative for dizziness, tingling, weakness and headaches.  Endo/Heme/Allergies:  Negative for environmental allergies. Does not bruise/bleed easily.  Psychiatric/Behavioral:  Negative for depression. The patient is not nervous/anxious and does not have insomnia.   All other systems reviewed and are negative.    Objective:  Physical Exam Vitals reviewed.  Constitutional:      General: He is not in acute distress.    Appearance: He is well-developed.  HENT:     Head: Normocephalic and atraumatic.  Eyes:     General: No scleral icterus.    Conjunctiva/sclera: Conjunctivae normal.     Pupils: Pupils are equal, round, and reactive to light.  Neck:     Vascular: No JVD.     Trachea: No tracheal deviation.  Cardiovascular:     Rate and Rhythm: Normal rate and regular rhythm.     Heart sounds: Normal heart sounds. No murmur heard. Pulmonary:     Effort: Pulmonary effort is normal. No tachypnea, accessory muscle usage or respiratory distress.     Breath sounds: No stridor. No wheezing, rhonchi or rales.  Abdominal:     General: There is no distension.     Palpations: Abdomen is soft.     Tenderness: There is no abdominal tenderness.  Musculoskeletal:        General: No tenderness.     Cervical back: Neck supple.  Lymphadenopathy:     Cervical: No cervical adenopathy.  Skin:    General: Skin is  warm and dry.     Capillary Refill: Capillary refill takes less than 2 seconds.     Findings: No rash.  Neurological:     Mental Status: He is alert and oriented to person, place, and time.  Psychiatric:        Behavior: Behavior normal.      Vitals:   01/26/23 1343  BP: (!) 110/52  Pulse: (!) 48  SpO2: 96%  Weight: 199 lb 12.8 oz (90.6 kg)  Height: '6\' 3"'$  (1.905 m)   96% on RA BMI Readings from Last 3 Encounters:  01/26/23 24.97 kg/m  01/15/23 25.77 kg/m  12/29/22 24.87 kg/m   Wt Readings from Last 3 Encounters:  01/26/23 199 lb 12.8 oz (90.6 kg)  01/15/23 206 lb 3.2 oz (93.5 kg)  12/29/22 199 lb (90.3 kg)     CBC    Component Value Date/Time   WBC 54.0 (HH) 12/29/2022 1404   RBC 3.98 (L) 12/29/2022 1404   RBC 3.98 (L) 12/29/2022 1404   HGB 11.6 (L) 12/29/2022 1404   HGB CANCELED 11/12/2022 1034   HCT 36.7 (L) 12/29/2022 1404   HCT CANCELED 11/12/2022 1034   PLT 79 (L) 12/29/2022 1404   PLT CANCELED 11/12/2022 1034   MCV 92.2 12/29/2022 1404   MCV CANCELED 11/12/2022 1034   MCH 29.1 12/29/2022 1404   MCHC 31.6 12/29/2022 1404   RDW 14.9 12/29/2022 1404   RDW CANCELED 11/12/2022 1034   LYMPHSABS 48.3 (H) 12/29/2022 1404   LYMPHSABS CANCELED 11/12/2022 1034   MONOABS 1.7 (H) 12/29/2022 1404   EOSABS 0.1 12/29/2022 1404   EOSABS CANCELED 11/12/2022 1034   BASOSABS 0.1 12/29/2022 1404   BASOSABS CANCELED 11/12/2022 1034    Chest Imaging:  01/08/2023 nuclear medicine pet imaging: Hypermetabolic adenopathy as well as hilar  mass within the right lung. The patient's images have been independently reviewed by me.    Pulmonary Functions Testing Results:     No data to display          FeNO:   Pathology:   Echocardiogram:   Heart Catheterization:     Assessment & Plan:     ICD-10-CM   1. Right lower lobe lung mass  R91.8 Procedural/ Surgical Case Request: VIDEO BRONCHOSCOPY WITH ENDOBRONCHIAL ULTRASOUND    Ambulatory referral to Pulmonology     2. Hilar mass  R91.8       Discussion:  This is a 72 year old, former smoker he does have CLL.  Has been managed for this for several years.  Plan: Patient with a hypermetabolic mass and we discussed utility of bronchoscopy with tissue sampling. Patient is agreeable to proceed. He is not on any blood thinners or antiplatelets. We talked about the risk of bleeding and pneumothorax. We will plan for videobronchoscopy with endobronchial ultrasound and TBNA.  We are working on a timely fashion to get this procedure completed. Potentially sometime this week at the latest would be on 02/10/2023.    Current Outpatient Medications:    atorvastatin (LIPITOR) 40 MG tablet, Take 1 tablet (40 mg total) by mouth daily., Disp: 90 tablet, Rfl: 3   ferrous sulfate 325 (65 FE) MG tablet, Take 325 mg by mouth daily with breakfast., Disp: , Rfl:    Multiple Vitamin (MULTIVITAMIN WITH MINERALS) TABS tablet, Take 1 tablet by mouth daily., Disp: , Rfl:    OLIVE LEAF EXTRACT PO, Take 1 tablet by mouth in the morning and at bedtime., Disp: , Rfl:    ondansetron (ZOFRAN) 4 MG tablet, Take 1 tablet (4 mg total) by mouth every 8 (eight) hours as needed for nausea or vomiting., Disp: 20 tablet, Rfl: 0   Vitamin D, Ergocalciferol, (DRISDOL) 1.25 MG (50000 UNIT) CAPS capsule, Take 1 capsule (50,000 Units total) by mouth every 7 (seven) days., Disp: 13 capsule, Rfl: 3   Vitamin D, Ergocalciferol, (DRISDOL) 1.25 MG (50000 UNIT) CAPS capsule, Take 1 capsule (50,000 Units total) by mouth every 7 (seven) days., Disp: 13 capsule, Rfl: 3  I spent 63 minutes dedicated to the care of this patient on the date of this encounter to include pre-visit review of records, face-to-face time with the patient discussing conditions above, post visit ordering of testing, clinical documentation with the electronic health record, making appropriate referrals as documented, and communicating necessary findings to members of the patients  care team.   Garner Nash, [facility] 01/26/2023 2:26 PM

## 2023-01-26 NOTE — Patient Instructions (Signed)
Thank you for visiting Dr. Valeta Harms at [facility]. Today we recommend the following:  Orders Placed This Encounter  Procedures   Procedural/ Surgical Case Request: [place]   Ambulatory referral to Pulmonology   Bronchoscopy on 02/10/2023  Return in about 22 days (around 02/17/2023) for w/ Eric Form, NP .    Please do your part to reduce the spread of COVID-19.

## 2023-01-27 ENCOUNTER — Ambulatory Visit: Payer: Medicare Other | Admitting: Hematology

## 2023-01-27 ENCOUNTER — Other Ambulatory Visit: Payer: Self-pay

## 2023-01-27 ENCOUNTER — Encounter (HOSPITAL_COMMUNITY): Payer: Self-pay | Admitting: Pulmonary Disease

## 2023-01-28 ENCOUNTER — Encounter (HOSPITAL_COMMUNITY): Payer: Self-pay | Admitting: Pulmonary Disease

## 2023-01-28 ENCOUNTER — Ambulatory Visit (HOSPITAL_COMMUNITY): Payer: Medicare Other | Admitting: Certified Registered Nurse Anesthetist

## 2023-01-28 ENCOUNTER — Other Ambulatory Visit: Payer: Self-pay

## 2023-01-28 ENCOUNTER — Ambulatory Visit (HOSPITAL_BASED_OUTPATIENT_CLINIC_OR_DEPARTMENT_OTHER): Payer: Medicare Other | Admitting: Certified Registered Nurse Anesthetist

## 2023-01-28 ENCOUNTER — Ambulatory Visit (HOSPITAL_COMMUNITY)
Admission: RE | Admit: 2023-01-28 | Discharge: 2023-01-28 | Disposition: A | Discharge status: Home / Self Care | Payer: Medicare Other | Attending: Pulmonary Disease | Admitting: Pulmonary Disease

## 2023-01-28 ENCOUNTER — Encounter (HOSPITAL_COMMUNITY)
Admission: RE | Disposition: A | Discharge status: Home / Self Care | Payer: Self-pay | Source: Home / Self Care | Attending: Pulmonary Disease

## 2023-01-28 DIAGNOSIS — R918 Other nonspecific abnormal finding of lung field: Secondary | ICD-10-CM | POA: Insufficient documentation

## 2023-01-28 DIAGNOSIS — Z87891 Personal history of nicotine dependence: Secondary | ICD-10-CM | POA: Diagnosis not present

## 2023-01-28 DIAGNOSIS — Z8616 Personal history of COVID-19: Secondary | ICD-10-CM | POA: Diagnosis not present

## 2023-01-28 DIAGNOSIS — R590 Localized enlarged lymph nodes: Secondary | ICD-10-CM

## 2023-01-28 DIAGNOSIS — C9110 Chronic lymphocytic leukemia of B-cell type not having achieved remission: Secondary | ICD-10-CM | POA: Diagnosis not present

## 2023-01-28 DIAGNOSIS — M1990 Unspecified osteoarthritis, unspecified site: Secondary | ICD-10-CM

## 2023-01-28 DIAGNOSIS — C8312 Mantle cell lymphoma, intrathoracic lymph nodes: Secondary | ICD-10-CM | POA: Insufficient documentation

## 2023-01-28 DIAGNOSIS — R599 Enlarged lymph nodes, unspecified: Secondary | ICD-10-CM

## 2023-01-28 HISTORY — PX: VIDEO BRONCHOSCOPY WITH ENDOBRONCHIAL ULTRASOUND: SHX6177

## 2023-01-28 HISTORY — DX: Anemia, unspecified: D64.9

## 2023-01-28 HISTORY — PX: BRONCHIAL NEEDLE ASPIRATION BIOPSY: SHX5106

## 2023-01-28 LAB — BASIC METABOLIC PANEL
Anion gap: 10 (ref 5–15)
BUN: 19 mg/dL (ref 8–23)
CO2: 25 mmol/L (ref 22–32)
Calcium: 8.9 mg/dL (ref 8.9–10.3)
Chloride: 105 mmol/L (ref 98–111)
Creatinine, Ser: 0.79 mg/dL (ref 0.61–1.24)
GFR, Estimated: >60 mL/min (ref >60)
Glucose, Bld: 101 mg/dL — ABNORMAL HIGH (ref 70–99)
Potassium: 5 mmol/L (ref 3.5–5.1)
Sodium: 140 mmol/L (ref 135–145)

## 2023-01-28 LAB — CBC
HCT: 35.5 % — ABNORMAL LOW (ref 39.0–52.0)
Hemoglobin: 11.5 g/dL — ABNORMAL LOW (ref 13.0–17.0)
MCH: 30.1 pg (ref 26.0–34.0)
MCHC: 32.4 g/dL (ref 30.0–36.0)
MCV: 92.9 fL (ref 80.0–100.0)
Platelets: 77 10*3/uL — ABNORMAL LOW (ref 150–400)
RBC: 3.82 MIL/uL — ABNORMAL LOW (ref 4.22–5.81)
RDW: 14.6 % (ref 11.5–15.5)
WBC: 52.7 10*3/uL (ref 4.0–10.5)
nRBC: 0 % (ref 0.0–0.2)

## 2023-01-28 LAB — SARS CORONAVIRUS 2 BY RT PCR: SARS Coronavirus 2 by RT PCR: NEGATIVE

## 2023-01-28 SURGERY — BRONCHOSCOPY, WITH EBUS
Anesthesia: General | Laterality: Bilateral

## 2023-01-28 MED ORDER — DEXAMETHASONE SODIUM PHOSPHATE 10 MG/ML IJ SOLN
INTRAMUSCULAR | Status: DC | PRN
  Administered 2023-01-28: 10 mg via INTRAVENOUS

## 2023-01-28 MED ORDER — SUGAMMADEX SODIUM 200 MG/2ML IV SOLN
INTRAVENOUS | Status: DC | PRN
  Administered 2023-01-28: 200 mg via INTRAVENOUS

## 2023-01-28 MED ORDER — FENTANYL CITRATE (PF) 250 MCG/5ML IJ SOLN
INTRAMUSCULAR | Status: DC | PRN
  Administered 2023-01-28 (×2): 50 ug via INTRAVENOUS

## 2023-01-28 MED ORDER — CHLORHEXIDINE GLUCONATE 0.12 % MT SOLN
15.0000 mL | OROMUCOSAL | Status: AC
  Administered 2023-01-28: 15 mL via OROMUCOSAL
  Filled 2023-01-28: qty 15

## 2023-01-28 MED ORDER — LIDOCAINE 2% (20 MG/ML) 5 ML SYRINGE
INTRAMUSCULAR | Status: DC | PRN
  Administered 2023-01-28: 20 mg via INTRAVENOUS

## 2023-01-28 MED ORDER — PROPOFOL 10 MG/ML IV BOLUS
INTRAVENOUS | Status: DC | PRN
  Administered 2023-01-28: 150 mg via INTRAVENOUS

## 2023-01-28 MED ORDER — LACTATED RINGERS IV SOLN: INTRAVENOUS | Status: DC

## 2023-01-28 MED ORDER — ROCURONIUM BROMIDE 10 MG/ML (PF) SYRINGE
PREFILLED_SYRINGE | INTRAVENOUS | Status: DC | PRN
  Administered 2023-01-28: 60 mg via INTRAVENOUS

## 2023-01-28 MED ORDER — PROPOFOL 500 MG/50ML IV EMUL
INTRAVENOUS | Status: DC | PRN
  Administered 2023-01-28: 100 ug/kg/min via INTRAVENOUS

## 2023-01-28 MED ORDER — ONDANSETRON HCL 4 MG/2ML IJ SOLN
INTRAMUSCULAR | Status: DC | PRN
  Administered 2023-01-28: 4 mg via INTRAVENOUS

## 2023-01-28 SURGICAL SUPPLY — 29 items

## 2023-01-28 NOTE — Discharge Instructions (Signed)

## 2023-01-28 NOTE — Op Note (Signed)
Video Bronchoscopy with Endobronchial Ultrasound Procedure Note  Date of Operation: 01/28/2023  Pre-op Diagnosis: Bulky mediastinal and hilar adenopathy  Post-op Diagnosis: Bulky mediastinal and hilar adenopathy  Surgeon: Garner Nash DO  Assistants: None   Anesthesia: General endotracheal anesthesia  Operation: Flexible video fiberoptic bronchoscopy with endobronchial ultrasound and biopsies.  Estimated Blood Loss: Minimal  Complications: None   Indications and History: [name] is a 72 y.o. male with history of CLL, bulky mediastinal hilar adenopathy.  The risks, benefits, complications, treatment options and expected outcomes were discussed with the patient.  The possibilities of pneumothorax, pneumonia, reaction to medication, pulmonary aspiration, perforation of a viscus, bleeding, failure to diagnose a condition and creating a complication requiring transfusion or operation were discussed with the patient who freely signed the consent.    Description of Procedure: The patient was examined in the preoperative area and history and data from the preprocedure consultation were reviewed. It was deemed appropriate to proceed.  The patient was taken to [facility], identified as Travis Wood and the procedure verified as Flexible Video Fiberoptic Bronchoscopy.  A Time Out was held and the above information confirmed. After being taken to the operating room general anesthesia was initiated and the patient  was orally intubated. The video fiberoptic bronchoscope was introduced via the endotracheal tube and a general inspection was performed which showed normal right and left lung anatomy. The standard scope was then withdrawn and the endobronchial ultrasound was used to identify and characterize the peritracheal, hilar and bronchial lymph nodes. Inspection showed enlarged subcarinal and hilar adenopathy.  Which forms a very large mass like conglomeration within the right hilum.. Using  real-time ultrasound guidance Wang needle biopsies were take from large right hilar mass conglomeration of nodes nodes and were sent for cytology. The patient tolerated the procedure well without apparent complications. There was no significant blood loss. The bronchoscope was withdrawn. Anesthesia was reversed and the patient was taken to the PACU for recovery.   Samples: 1. Wang needle biopsies from right hilar mass/conglomeration of nodes  Plans:  The patient will be discharged from the PACU to home when recovered from anesthesia. We will review the cytology, pathology and microbiology results with the patient when they become available. Outpatient followup will be with Dr. Delton Coombes.   Garner Nash, DO [facility] 01/28/2023 10:07 AM

## 2023-01-28 NOTE — Anesthesia Preprocedure Evaluation (Addendum)
Anesthesia Evaluation  Patient identified by MRN, date of birth, ID band Patient awake    Reviewed: Allergy & Precautions, NPO status , Patient's Chart, lab work & pertinent test results  Airway Mallampati: II  TM Distance: <3 FB Neck ROM: Full    Dental  (+) Teeth Intact, Dental Advisory Given, Caps   Pulmonary former smoker    + decreased breath sounds      Cardiovascular negative cardio ROS  Rhythm:Regular Rate:Normal     Neuro/Psych negative neurological ROS  negative psych ROS   GI/Hepatic negative GI ROS, Neg liver ROS,,,  Endo/Other    Renal/GU negative Renal ROS     Musculoskeletal  (+) Arthritis ,    Abdominal   Peds  Hematology negative hematology ROS (+)   Anesthesia Other Findings   Reproductive/Obstetrics                             Anesthesia Physical Anesthesia Plan  ASA: 3  Anesthesia Plan: General   Post-op Pain Management: Minimal or no pain anticipated   Induction: Intravenous  PONV Risk Score and Plan: 2 and Ondansetron, Propofol infusion and TIVA  Airway Management Planned: Oral ETT  Additional Equipment: None  Intra-op Plan:   Post-operative Plan: Extubation in OR  Informed Consent: I have reviewed the patients History and Physical, chart, labs and discussed the procedure including the risks, benefits and alternatives for the proposed anesthesia with the patient or authorized representative who has indicated his/her understanding and acceptance.     Dental advisory given  Plan Discussed with: CRNA  Anesthesia Plan Comments:        Anesthesia Quick Evaluation

## 2023-01-28 NOTE — Anesthesia Procedure Notes (Signed)
Procedure Name: Intubation Date/Time: 01/28/2023 8:56 AM  Performed by: Darletta Moll, CRNAPre-anesthesia Checklist: Patient identified, Emergency Drugs available, Suction available and Patient being monitored Patient Re-evaluated:Patient Re-evaluated prior to induction Oxygen Delivery Method: Circle system utilized Preoxygenation: Pre-oxygenation with 100% oxygen Induction Type: IV induction Ventilation: Mask ventilation without difficulty and Oral airway inserted - appropriate to patient size Laryngoscope Size: Mac and 4 Grade View: Grade II Tube type: Oral Tube size: 8.5 mm Number of attempts: 1 Airway Equipment and Method: Stylet and Oral airway Placement Confirmation: ETT inserted through vocal cords under direct vision, positive ETCO2 and breath sounds checked- equal and bilateral Secured at: 23 cm Tube secured with: Tape Dental Injury: Teeth and Oropharynx as per pre-operative assessment

## 2023-01-28 NOTE — Anesthesia Postprocedure Evaluation (Signed)
Anesthesia Post Note  Patient: Travis Wood  Procedure(s) Performed: VIDEO BRONCHOSCOPY WITH ENDOBRONCHIAL ULTRASOUND (Bilateral) BRONCHIAL NEEDLE ASPIRATION BIOPSIES     Patient location during evaluation: PACU Anesthesia Type: General Level of consciousness: awake and alert Pain management: pain level controlled Vital Signs Assessment: post-procedure vital signs reviewed and stable Respiratory status: spontaneous breathing, nonlabored ventilation, respiratory function stable and patient connected to nasal cannula oxygen Cardiovascular status: blood pressure returned to baseline and stable Postop Assessment: no apparent nausea or vomiting Anesthetic complications: no  No notable events documented.  Last Vitals:  Vitals:   01/28/23 1030 01/28/23 1040  BP: 126/75 119/70  Pulse: (!) 57 (!) 55  Resp: 12 13  Temp:  36.5 C  SpO2: 95% 96%    Last Pain:  Vitals:   01/28/23 1040  TempSrc:   PainSc: 0-No pain                 Effie Berkshire

## 2023-01-28 NOTE — Interval H&P Note (Signed)
History and Physical Interval Note:  01/28/2023 7:32 AM  Travis Wood  has presented today for surgery, with the diagnosis of lung mass.  The various methods of treatment have been discussed with the patient and family. After consideration of risks, benefits and other options for treatment, the patient has consented to  Procedure(s): [place] (Bilateral) as a surgical intervention.  The patient's history has been reviewed, patient examined, no change in status, stable for surgery.  I have reviewed the patient's chart and labs.  Questions were answered to the patient's satisfaction.     Govan

## 2023-01-28 NOTE — Transfer of Care (Signed)
Immediate Anesthesia Transfer of Care Note  Patient: Travis Wood  Procedure(s) Performed: VIDEO BRONCHOSCOPY WITH ENDOBRONCHIAL ULTRASOUND (Bilateral) BRONCHIAL NEEDLE ASPIRATION BIOPSIES  Patient Location: PACU  Anesthesia Type:General  Level of Consciousness: drowsy and patient cooperative  Airway & Oxygen Therapy: Patient Spontanous Breathing and Patient connected to face mask oxygen  Post-op Assessment: Report given to RN, Post -op Vital signs reviewed and stable, and Patient moving all extremities X 4  Post vital signs: Reviewed and stable  Last Vitals:  Vitals Value Taken Time  BP 108/64 01/28/23 0932  Temp    Pulse 51 01/28/23 0933  Resp 14 01/28/23 0933  SpO2 95 % 01/28/23 0933  Vitals shown include unvalidated device data.  Last Pain:  Vitals:   01/28/23 0614  TempSrc:   PainSc: 0-No pain         Complications: No notable events documented.

## 2023-01-28 NOTE — Progress Notes (Signed)
Anesthesia made aware of WBCs. No new orders at this time

## 2023-02-01 ENCOUNTER — Encounter (HOSPITAL_COMMUNITY): Payer: Self-pay | Admitting: Pulmonary Disease

## 2023-02-02 LAB — SURGICAL PATHOLOGY

## 2023-02-02 NOTE — Progress Notes (Signed)
I called and spoke with patient regarding recent pathology results.  Path consistent with mantle cell lymphoma.  He has an appointment to see Dr. Delton Coombes on Wednesday.  Thanks,  BLI  Garner Nash, DO [facility] 02/02/2023 5:31 PM

## 2023-02-03 NOTE — Progress Notes (Signed)
Travis Wood [address] [place] [address]    Clinic Day:  02/04/2023  Referring physician: Derek Jack, MD  Patient Care Team: Derek Jack, MD as PCP - General (Hematology) Derek Jack, MD as Medical Oncologist (Medical Oncology) Brien Mates, RN as Oncology Nurse Navigator (Medical Oncology)   ASSESSMENT & PLAN:   Assessment: 1.  Chronic lymphocytic leukemia: - Flow cytometry (01/26/2019): Monoclonal B-cell population consistent with CLL. - CT chest (12/12/2022): Large right hilar mass/bulky adenopathy measuring 7.5 x 6.4 cm encasing the right mainstem bronchus and lobar branches.  Additional mediastinal adenopathy, axillary and lower cervical adenopathy. - CT [facility] (12/12/2022): Severe splenomegaly measuring 21.5 cm.  Prominent retroperitoneal lymph nodes, left retroperitoneal nodes measuring 1.6 x 1.0 cm.  Subcapsular hypodense lesion of the peripheral liver dome measuring 1.6 x 1.1 cm.  Additional subcentimeter hypodense lesions too small to characterize. - No B symptoms.  Patient plays golf 3-4 times per week.  No recurrent infections. - CLL FISH panel with 13 q. deletion - T p53 mutation negative - IGHV hyper mutation present. - PET scan (01/08/2023): Marked right hilar adenopathy with lymph node conglomerate/large lymph node measuring 5 cm with SUV of 12.3.  There is hypermetabolic adenopathy above and below diaphragm with splenomegaly. - Bronchoscopy (01/28/2023) and biopsy of the right hilar mass by Dr. Valeta Harms - Pathology: CD5 positive B-cell lymphoproliferative disorder, cyclin D1 positive, favoring mantle cell lymphoma.  FISH for t(11:14) pending.   2.  Social/family history: - Lives at home with his girlfriend.  Independent of ADLs and IADLs. - He retired 8 years ago after working at [employer] in [place].  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  Quit smoking in 1992.  Smoked 1 pack/day for 10  years. - Sister had breast cancer.  Another sister had ovarian and stomach cancer.  Father had colon cancer.  Mother had CLL.  Paternal aunt had lung cancer.  Another paternal aunt had breast cancer.  Plan: 1.  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 - As cyclin D1 was positive, favoring mantle cell lymphoma, FISH testing for t(11;14) was ordered. - I have called and talked to pathologist Dr. Arby Barrette. - We will be anticipating the results early next week for the [facility] testing. - I have scheduled follow-up phone visit with the patient next Wednesday. - We have also discussed treatment options based on diagnosis. - If it is mantle cell lymphoma, will consider treatment with Bendamustine and rituximab as patient prefers limited duration treatment. - If it is CLL, would recommend treatment with fixed duration venetoclax and obinutuzumab.  No orders of the defined types were placed in this encounter.     I,Alexis Herring,acting as a Education administrator for [employer], MD.,have documented all relevant documentation on the behalf of Derek Jack, MD,as directed by  Derek Jack, MD while in the presence of Derek Jack, MD.  I, Derek Jack MD, have reviewed the above documentation for accuracy and completeness, and I agree with the above.    Derek Jack, MD   3/6/20245:22 PM  CHIEF COMPLAINT:   Diagnosis: Generalized lymphadenopathy and CLL    Cancer Staging  Chronic lymphocytic leukemia ([place]) Staging form: Chronic Lymphocytic Leukemia / Small Lymphocytic Lymphoma, AJCC 8th Edition - Clinical stage from 01/14/2023: Modified Rai Stage IV (Modified Rai risk: High, Lymphocytosis: Present, Adenopathy: Present, Organomegaly:  Present, Anemia: Present, Thrombocytopenia: Present) - Unsigned    Prior Therapy: none  Current Therapy:   none   HISTORY OF PRESENT ILLNESS:   Oncology History   No history exists.     INTERVAL HISTORY:   Travis Wood is a 72 y.o. male presenting to clinic today for follow up of Generalized lymphadenopathy and CLL. He was last seen by me on 01/15/23.  Patient underwent bronchoscopy biopsy on 01/28/23.  Today, he states that he is doing well overall. His appetite level is at 100%. His energy level is at 80%.   PAST MEDICAL HISTORY:   Past Medical History: Past Medical History:  Diagnosis Date   Anemia    Cataract    CLL (chronic lymphocytic leukemia) (HCC)    Erectile dysfunction    History of bronchitis    History of colon polyps    Hyperlipemia    Osteoarthritis    Shortness of breath dyspnea    with exertion    Surgical History: Past Surgical History:  Procedure Laterality Date   APPENDECTOMY     BRONCHIAL NEEDLE ASPIRATION BIOPSY  01/28/2023   Procedure: BRONCHIAL NEEDLE ASPIRATION BIOPSIES;  Surgeon: Garner Nash, DO;  Location: [place];  Service: Cardiopulmonary;;   CATARACT EXTRACTION W/PHACO Left 10/15/2015   Procedure: CATARACT EXTRACTION PHACO AND INTRAOCULAR LENS PLACEMENT LEFT EYE;  Surgeon: Tonny Branch, MD;  Location: [facility];  Service: Ophthalmology;  Laterality: Left;  CDE:12.20   CATARACT EXTRACTION W/PHACO Right 11/12/2015   Procedure: CATARACT EXTRACTION PHACO AND INTRAOCULAR LENS PLACEMENT RIGHT EYE:  CDE:  8.99;  Surgeon: Tonny Branch, MD;  Location: [facility];  Service: Ophthalmology;  Laterality: Right;   KNEE SURGERY     Left x 2    MASS EXCISION N/A 09/12/2015   Procedure: EXCISION SOFT TISSUE NEOPLASM (6 CM), BACK;  Surgeon: Aviva Signs, MD;  Location: [facility];  Service: General;  Laterality: N/A;   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   Procedure: TOTAL KNEE ARTHROPLASTY;  Surgeon: Paralee Cancel, MD;  Location: [facility];  Service: Orthopedics;  Laterality: Right;  70 mins   VIDEO BRONCHOSCOPY WITH ENDOBRONCHIAL ULTRASOUND Bilateral 01/28/2023    Procedure: VIDEO BRONCHOSCOPY WITH ENDOBRONCHIAL ULTRASOUND;  Surgeon: Garner Nash, DO;  Location: [facility];  Service: Cardiopulmonary;  Laterality: Bilateral;    Social History: Social History   Socioeconomic History   Marital status: Soil scientist    Spouse name: Not on file   Number of children: 2   Years of education: 12 years   Highest education level: 12th grade  Occupational History   Occupation: RETIRED  Tobacco Use   Smoking status: Former    Packs/day: 0.00    Years: 3.00    Total pack years: 0.00    Types: Cigarettes, Pipe    Quit date: 09/10/1991    Years since quitting: 31.4   Smokeless tobacco: Never  Vaping Use   Vaping Use: Never used  Substance and Sexual Activity   Alcohol use: Not Currently    Alcohol/week: 1.0 standard drink of alcohol    Types: 1 Cans of beer per week   Drug use: Yes    Frequency: 1.0 times per week    Types: Marijuana   Sexual activity: Yes  Other Topics Concern   Not on file  Social History Narrative   2 sons who live close by   Lives with girlfriend   Social Determinants of Health   Financial Resource Strain: Low Risk  (11/06/2022)   Overall  Financial Resource Strain (CARDIA)    Difficulty of Paying Living Expenses: Not hard at all  Food Insecurity: No Food Insecurity (12/29/2022)   Hunger Vital Sign    Worried About Running Out of Food in the Last Year: Never true    Ran Out of Food in the Last Year: Never true  Transportation Needs: No Transportation Needs (11/06/2022)   PRAPARE - Hydrologist (Medical): No    Lack of Transportation (Non-Medical): No  Physical Activity: Sufficiently Active (11/06/2022)   Exercise Vital Sign    Days of Exercise per Week: 7 days    Minutes of Exercise per Session: 30 min  Stress: No Stress Concern Present (11/06/2022)   [place]    Feeling of Stress : Not at all  Social Connections:  Socially Integrated (11/06/2022)   Social Connection and Isolation Panel [NHANES]    Frequency of Communication with Friends and Family: More than three times a week    Frequency of Social Gatherings with Friends and Family: More than three times a week    Attends Religious Services: 1 to 4 times per year    Active Member of [employer] or Organizations: Yes    Attends Music therapist: More than 4 times per year    Marital Status: Living with partner  Intimate Partner Violence: Not At Risk (12/29/2022)   Humiliation, Afraid, Rape, and Kick questionnaire    Fear of Current or Ex-Partner: No    Emotionally Abused: No    Physically Abused: No    Sexually Abused: No    Family History: Family History  Problem Relation Age of Onset   Colon cancer Father        age 23    Colon polyps Father    Heart disease Father        Heart failure   Heart disease Mother    Cancer Sister        Uterine, and ovarian   Hypertension Brother    Obesity Sister    Heart disease Maternal Grandmother    Esophageal cancer Neg Hx    Liver cancer Neg Hx    Pancreatic cancer Neg Hx    Rectal cancer Neg Hx    Stomach cancer Neg Hx     Current Medications:  Current Outpatient Medications:    atorvastatin (LIPITOR) 40 MG tablet, Take 1 tablet (40 mg total) by mouth daily., Disp: 90 tablet, Rfl: 3   ferrous sulfate 325 (65 FE) MG tablet, Take 325 mg by mouth daily with breakfast., Disp: , Rfl:    Multiple Vitamin (MULTIVITAMIN WITH MINERALS) TABS tablet, Take 1 tablet by mouth daily., Disp: , Rfl:    OLIVE LEAF EXTRACT PO, Take 1 tablet by mouth in the morning and at bedtime., Disp: , Rfl:    ondansetron (ZOFRAN) 4 MG tablet, Take 1 tablet (4 mg total) by mouth every 8 (eight) hours as needed for nausea or vomiting., Disp: 20 tablet, Rfl: 0   Vitamin D, Ergocalciferol, (DRISDOL) 1.25 MG (50000 UNIT) CAPS capsule, Take 1 capsule (50,000 Units total) by mouth every 7 (seven) days., Disp: 13 capsule, Rfl:  3   Vitamin D, Ergocalciferol, (DRISDOL) 1.25 MG (50000 UNIT) CAPS capsule, Take 1 capsule (50,000 Units total) by mouth every 7 (seven) days., Disp: 13 capsule, Rfl: 3   Allergies: No Known Allergies  REVIEW OF SYSTEMS:   Review of Systems  Constitutional:  Negative for chills,  fatigue and fever.  HENT:   Negative for lump/mass, mouth sores, nosebleeds, sore throat and trouble swallowing.   Eyes:  Negative for eye problems.  Respiratory:  Negative for cough and shortness of breath.   Cardiovascular:  Negative for chest pain, leg swelling and palpitations.  Gastrointestinal:  Negative for abdominal pain, constipation, diarrhea, nausea and vomiting.  Genitourinary:  Negative for bladder incontinence, difficulty urinating, dysuria, frequency, hematuria and nocturia.   Musculoskeletal:  Negative for arthralgias, back pain, flank pain, myalgias and neck pain.  Skin:  Negative for itching and rash.  Neurological:  Negative for dizziness, headaches and numbness.  Hematological:  Does not bruise/bleed easily.  Psychiatric/Behavioral:  Negative for depression, sleep disturbance and suicidal ideas. The patient is not nervous/anxious.   All other systems reviewed and are negative.    VITALS:   Blood pressure (!) 119/58, pulse 71, temperature 98 F (36.7 C), temperature source Oral, resp. rate 18, weight 196 lb 6.4 oz (89.1 kg), SpO2 98 %.  Wt Readings from Last 3 Encounters:  02/04/23 196 lb 6.4 oz (89.1 kg)  01/28/23 200 lb (90.7 kg)  01/26/23 199 lb 12.8 oz (90.6 kg)    Body mass index is 24.55 kg/m.  Performance status (ECOG): 1 - Symptomatic but completely ambulatory  PHYSICAL EXAM:   Physical Exam Vitals and nursing note reviewed. Exam conducted with a chaperone present.  Constitutional:      Appearance: Normal appearance.  Cardiovascular:     Rate and Rhythm: Normal rate and regular rhythm.     Pulses: Normal pulses.     Heart sounds: Normal heart sounds.  Pulmonary:      Effort: Pulmonary effort is normal.     Breath sounds: Normal breath sounds.  Abdominal:     Palpations: Abdomen is soft. There is no hepatomegaly, splenomegaly or mass.     Tenderness: There is no abdominal tenderness.  Musculoskeletal:     Right lower leg: No edema.     Left lower leg: No edema.  Lymphadenopathy:     Cervical: No cervical adenopathy.     Right cervical: No superficial, deep or posterior cervical adenopathy.    Left cervical: No superficial, deep or posterior cervical adenopathy.     Upper Body:     Right upper body: No supraclavicular or axillary adenopathy.     Left upper body: No supraclavicular or axillary adenopathy.  Neurological:     General: No focal deficit present.     Mental Status: He is alert and oriented to person, place, and time.  Psychiatric:        Mood and Affect: Mood normal.        Behavior: Behavior normal.     LABS:      Latest Ref Rng & Units 01/28/2023    6:20 AM 12/29/2022    2:04 PM 11/12/2022   10:34 AM  CBC  WBC 4.0 - 10.5 K/uL 52.7  54.0  CANCELED   Hemoglobin 13.0 - 17.0 g/dL 11.5  11.6  CANCELED   Hematocrit 39.0 - 52.0 % 35.5  36.7  CANCELED   Platelets 150 - 400 K/uL 77  79  CANCELED       Latest Ref Rng & Units 01/28/2023    6:20 AM 12/29/2022    2:04 PM 11/12/2022   10:34 AM  CMP  Glucose 70 - 99 mg/dL 101  94  93   BUN 8 - 23 mg/dL '19  23  16   '$ Creatinine 0.61 -  1.24 mg/dL 0.79  0.87  0.81   Sodium 135 - 145 mmol/L 140  141  143   Potassium 3.5 - 5.1 mmol/L 5.0  4.4  4.4   Chloride 98 - 111 mmol/L 105  107  106   CO2 22 - 32 mmol/L '25  26  24   '$ Calcium 8.9 - 10.3 mg/dL 8.9  8.6  9.1   Total Protein 6.5 - 8.1 g/dL  6.4  6.0   Total Bilirubin 0.3 - 1.2 mg/dL  0.8  0.5   Alkaline Phos 38 - 126 U/L  80  90   AST 15 - 41 U/L  22  19   ALT 0 - 44 U/L  16  16      No results found for: "CEA1", "CEA" / No results found for: "CEA1", "CEA" Lab Results  Component Value Date   PSA1 0.6 06/06/2020   No results found  for: "[id number]" No results found for: "CAN125"  Lab Results  Component Value Date   TOTALPROTELP 6.0 01/26/2019   ALBUMINELP 3.9 01/26/2019   A1GS 0.2 01/26/2019   A2GS 0.5 01/26/2019   BETS 0.9 01/26/2019   GAMS 0.5 01/26/2019   MSPIKE Not Observed 01/26/2019   SPEI Comment 01/26/2019   Lab Results  Component Value Date   TIBC 310 08/21/2021   FERRITIN 421 (H) 08/21/2021   IRONPCTSAT 20 08/21/2021   Lab Results  Component Value Date   LDH 140 12/29/2022   LDH 143 10/01/2020   LDH 123 01/26/2019   Pathology 01/28/23: FINAL MICROSCOPIC DIAGNOSIS: A. LUNG, RT HILAR MASS, FINE NEEDLE ASPIRATION: -CD5 positive B-cell lymphoproliferative disorder, favor mantle cell lymphoma, see note.  Note: It is noted that the patient has a history of chronic lymphocytic leukemia/small lymphocytic lymphoma with a 13q deletion and IGHV hyper mutation.  The current cellblock shows numerous clusters of small lymphocytes with mature appearing chromatin that are overall round without significant nuclear irregularity.  The cells by immunohistochemistry are positive for CD20, CD5, CD79a as well as PAX5 and coexpress cyclin D1.  A subset are weakly positive for CD23; however, CD23 is overall negative in the majority of the cells of interest.  CD3 highlights background T cells and CD10 is negative.  Flow cytometry identifies a 7% of cells to be CD5 positive lambda restricted B cells that coexpress CD38 and CD200.  Given the presence of cyclin D1 staining and mantle cell lymphoma is favored; however, given the clinical history of chronic lymphocytic leukemia confirmatory FISH is pending to clarify the diagnosis.   STUDIES:   NM PET Image Initial (PI) Skull Base To Thigh  Result Date: 01/09/2023 CLINICAL DATA:  Initial treatment strategy for chronic lymphocytic leukemia. EXAM: NUCLEAR MEDICINE PET SKULL BASE TO THIGH TECHNIQUE: 11.1 mCi F-18 FDG was injected intravenously. Full-ring PET imaging was  performed from the skull base to thigh after the radiotracer. CT data was obtained and used for attenuation correction and anatomic localization. Fasting blood glucose: 104 mg/dl COMPARISON:  CT December 12, 2022 FINDINGS: Mediastinal blood pool activity: SUV max 1.7 Liver activity: SUV max 2.5 NECK: Hypermetabolic bilateral cervical and supraclavicular adenopathy. For reference: -right level 2 cervical lymph node measures 11 mm in short axis on image 40/3 with a max SUV of 6.5. Hypermetabolic tonsillar hyperplasia, asymmetric to the left with a max SUV of 6.0 Incidental CT findings: None. CHEST: Marked right hilar adenopathy for instance a lymph node conglomerate/large lymph node measures 5 cm on image 105/3 with a  max SUV of 12.3. Hypermetabolic bilateral axillary, subpectoral, and mediastinal lymph nodes. -left axillary lymph node measures 15 mm in short axis on image 87/3 with a max SUV of 6.1 -high right paratracheal lymph node measures 19 mm in short axis on image 87/3 with a max SUV of 9.5 Incidental CT findings: Aortic atherosclerosis. Narrowing of the right lobar bronchi particularly the right middle and upper lobe bronchi by the right hilar adenopathy. ABDOMEN/PELVIS: Minimally metabolic abdominal retroperitoneal lymph nodes. For reference: Left periaortic lymph node at the level of the renal hilum measures 11 mm in short axis on image 177/3 with a max SUV of 2.0. Hypermetabolic iliac side chain, pelvic sidewall and inguinal lymph nodes. For reference: -left external iliac lymph node measures 12 mm in short axis on image 240/3 with a max SUV of 6.1. -right external iliac lymph node measures 2.2 cm in short axis on image 240/3 with a max SUV of 3.3. -left inguinal lymph node measures 18 mm on image 264/3 with a max SUV of 6.9 Splenomegaly with uptake similar to that of background liver without focality, max SUV of 2.6 Incidental CT findings: Hypodense hepatic lesion in the peripheral hepatic dome measuring  16 mm on image 131/3 is photopenic and likely reflects a cyst. Diffuse bladder wall thickening. Colonic diverticulosis without findings of acute diverticulitis. Aortic atherosclerosis. SKELETON: No focal hypermetabolic activity to suggest skeletal metastasis. Incidental CT findings: Multilevel degenerative change of the spine with mild multifocal degenerative joint disease. IMPRESSION: 1. Hypermetabolic adenopathy above and below the diaphragm with splenomegaly, compatible with patient's known lymphocytic leukemia. 2. Diffuse bladder wall thickening. 3. Colonic diverticulosis without findings of acute diverticulitis. 4.  Aortic Atherosclerosis (ICD10-I70.0). Electronically Signed   By: Dahlia Bailiff M.D.   On: 01/09/2023 09:13

## 2023-02-04 ENCOUNTER — Ambulatory Visit: Payer: Medicare Other | Admitting: Hematology

## 2023-02-04 ENCOUNTER — Inpatient Hospital Stay: Payer: Medicare Other | Attending: Hematology | Admitting: Hematology

## 2023-02-04 ENCOUNTER — Encounter: Payer: Self-pay | Admitting: Hematology

## 2023-02-04 VITALS — BP 119/58 | HR 71 | Temp 98.0°F | Resp 18 | Wt 196.4 lb

## 2023-02-04 DIAGNOSIS — C9110 Chronic lymphocytic leukemia of B-cell type not having achieved remission: Secondary | ICD-10-CM | POA: Insufficient documentation

## 2023-02-04 DIAGNOSIS — R591 Generalized enlarged lymph nodes: Secondary | ICD-10-CM | POA: Insufficient documentation

## 2023-02-04 DIAGNOSIS — Z87891 Personal history of nicotine dependence: Secondary | ICD-10-CM | POA: Diagnosis not present

## 2023-02-04 NOTE — Patient Instructions (Addendum)
[facility]  Discharge Instructions  You were seen and examined today by Dr. Delton Coombes.  Dr. Delton Coombes has reviewed your recent biopsy which is concerning for mantle cell lymphoma.  Dr. Delton Coombes has recommended that we wait for the results to be finalized.   Follow-up as scheduled by phone visit.  Thank you for choosing [facility] to provide your oncology and hematology care.   To afford each patient quality time with our provider, please arrive at least 15 minutes before your scheduled appointment time. You may need to reschedule your appointment if you arrive late (10 or more minutes). Arriving late affects you and other patients whose appointments are after yours.  Also, if you miss three or more appointments without notifying the office, you may be dismissed from the clinic at the provider's discretion.    Again, thank you for choosing [facility].  Our hope is that these requests will decrease the amount of time that you wait before being seen by our physicians.   If you have a lab appointment with the [facility] please come in thru the Main Entrance and check in at the main information desk.           _____________________________________________________________  Should you have questions after your visit to [facility], please contact our office at [phone] and follow the prompts.  Our office hours are 8:00 a.m. to 4:30 p.m. Monday - Thursday and 8:00 a.m. to 2:30 p.m. Friday.  Please note that voicemails left after 4:00 p.m. may not be returned until the following business day.  We are closed weekends and all major holidays.  You do have access to a nurse 24-7, just call the main number to the clinic [phone] and do not press any options, hold on the line and a nurse will answer the phone.    For prescription refill requests, have your pharmacy contact our office and allow 72 hours.     Masks are optional in the cancer centers. If you would like for your care team to wear a mask while they are taking care of you, please let them know. You may have one support person who is at least 72 years old accompany you for your appointments.

## 2023-02-10 DIAGNOSIS — R918 Other nonspecific abnormal finding of lung field: Secondary | ICD-10-CM | POA: Insufficient documentation

## 2023-02-11 ENCOUNTER — Inpatient Hospital Stay (HOSPITAL_BASED_OUTPATIENT_CLINIC_OR_DEPARTMENT_OTHER): Payer: Medicare Other | Admitting: Hematology

## 2023-02-11 ENCOUNTER — Encounter (HOSPITAL_COMMUNITY): Payer: Self-pay

## 2023-02-11 ENCOUNTER — Encounter: Payer: Self-pay | Admitting: Hematology

## 2023-02-11 DIAGNOSIS — C8318 Mantle cell lymphoma, lymph nodes of multiple sites: Secondary | ICD-10-CM | POA: Diagnosis not present

## 2023-02-11 DIAGNOSIS — C8310 Mantle cell lymphoma, unspecified site: Secondary | ICD-10-CM | POA: Insufficient documentation

## 2023-02-11 LAB — CYTOLOGY - NON PAP

## 2023-02-11 NOTE — Progress Notes (Signed)
Virtual Visit via Telephone Note  I connected with Travis Wood on 02/11/23 at  4:00 PM EDT by telephone and verified that I am speaking with the correct person using two identifiers.  Location: Patient: home Provider: clinic-office   I discussed the limitations, risks, security and privacy concerns of performing an evaluation and management service by telephone and the availability of in person appointments. I also discussed with the patient that there may be a patient responsible charge related to this service. The patient expressed understanding and agreed to proceed.   History of Present Illness: Travis Wood is a 72 y.o. male presenting to clinic today for follow up of Generalized lymphadenopathy and CLL.  He was last seen by me on 02/04/23.     Observations/Objective: Today, he states that  is doing well overall. His appetite level is at 100%. His energy level is at 75%.  He just came back from golfing trip over the weekend.  Assessment and Plan:  Assessment: 1.  Chronic lymphocytic leukemia: - Flow cytometry (01/26/2019): Monoclonal B-cell population consistent with CLL. - CT chest (12/12/2022): Large right hilar mass/bulky adenopathy measuring 7.5 x 6.4 cm encasing the right mainstem bronchus and lobar branches.  Additional mediastinal adenopathy, axillary and lower cervical adenopathy. - CT AP (12/12/2022): Severe splenomegaly measuring 21.5 cm.  Prominent retroperitoneal lymph nodes, left retroperitoneal nodes measuring 1.6 x 1.0 cm.  Subcapsular hypodense lesion of the peripheral liver dome measuring 1.6 x 1.1 cm.  Additional subcentimeter hypodense lesions too small to characterize. - No B symptoms.  Patient plays golf 3-4 times per week.  No recurrent infections. - CLL FISH panel with 13 q. deletion - T p53 mutation negative - IGHV hyper mutation present. - PET scan (01/08/2023): Marked right hilar adenopathy with lymph node conglomerate/large lymph node measuring 5 cm with SUV of 12.3.   There is hypermetabolic adenopathy above and below diaphragm with splenomegaly. - Bronchoscopy (01/28/2023) and biopsy of the right hilar mass by Dr. Valeta Harms - Pathology: CD5 positive B-cell lymphoproliferative disorder, cyclin D1 positive, favoring mantle cell lymphoma.  FISH for t(11:14) pending.   2.  Social/family history: - Lives at home with his girlfriend.  Independent of ADLs and IADLs. - He retired 8 years ago after working at [employer] in [place].  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  Quit smoking in 1992.  Smoked 1 pack/day for 10 years. - Sister had breast cancer.  Another sister had ovarian and stomach cancer.  Father had colon cancer.  Mother had CLL.  Paternal aunt had lung cancer.  Another paternal aunt had breast cancer.   Plan: 1.  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 - FISH testing for translocation (11:14) was positive indicating its mantle cell lymphoma. - We talked about the diagnosis in detail. - I have recommended treatment with less aggressive induction therapy, Bendamustine and rituximab every 28 days for 6 cycles as he is not a transplant candidate. - I have recommended port placement. - I will see him on day 1 of treatment.  2.  TLS prophylaxis: - Will check TLS labs on day 1. - Will start him on allopurinol 300 mg daily. - If the uric acid is severely high, will consider rasburicase.   Follow Up Instructions: RTC after port placement to initiate therapy.   I discussed the assessment and treatment plan with the patient. The patient was provided an  opportunity to ask questions and all were answered. The patient agreed with the plan and demonstrated an understanding of the instructions.   The patient was advised to call back or seek an in-person evaluation if the symptoms worsen or if the condition fails to improve as anticipated.  I provided 21  minutes of non-face-to-face time during this encounter.   I,Alexis Herring,acting as a Education administrator for [employer], MD.,have documented all relevant documentation on the behalf of Derek Jack, MD,as directed by  Derek Jack, MD while in the presence of Derek Jack, MD.  I, Derek Jack MD, have reviewed the above documentation for accuracy and completeness, and I agree with the above.   Derek Jack, MD

## 2023-02-11 NOTE — Progress Notes (Signed)
START ON PATHWAY REGIMEN - Lymphoma and CLL ° ° °  A cycle is every 28 days: °    Rituximab-xxxx  °    Bendamustine  ° °**Always confirm dose/schedule in your pharmacy ordering system** ° °Patient Characteristics: °Mantle Cell Lymphoma, First Line, Aggressive Disease or Treatment Indicated, Stage II - IV, Not a Transplant Candidate °Disease Type: Not Applicable °Disease Type: Mantle Cell Lymphoma °Disease Type: Not Applicable °Line of Therapy: First Line °Was TP53 Mutation Testing Completed<= Yes, TP53 Mutation Negative °Patient Characteristics: Not a Transplant Candidate °Intent of Therapy: °Non-Curative / Palliative Intent, Discussed with Patient °

## 2023-02-12 ENCOUNTER — Other Ambulatory Visit: Payer: Self-pay

## 2023-02-12 ENCOUNTER — Encounter: Payer: Self-pay | Admitting: Hematology

## 2023-02-12 DIAGNOSIS — C8318 Mantle cell lymphoma, lymph nodes of multiple sites: Secondary | ICD-10-CM

## 2023-02-12 NOTE — Progress Notes (Signed)
Order placed for IR port placement per Dr. Delton Coombes verbal order.

## 2023-02-16 NOTE — Progress Notes (Signed)
Fyi seeing you this week  Thanks,  BLI  Garner Nash, DO  Pulmonary Critical Care 02/16/2023 1:15 PM

## 2023-02-17 ENCOUNTER — Ambulatory Visit: Payer: Medicare Other | Admitting: Hematology

## 2023-02-18 ENCOUNTER — Ambulatory Visit: Payer: Medicare Other | Admitting: Acute Care

## 2023-02-18 ENCOUNTER — Encounter: Payer: Self-pay | Admitting: Acute Care

## 2023-02-18 VITALS — BP 102/58 | HR 54 | Temp 98.0°F | Ht 75.0 in | Wt 200.6 lb

## 2023-02-18 DIAGNOSIS — C8318 Mantle cell lymphoma, lymph nodes of multiple sites: Secondary | ICD-10-CM | POA: Diagnosis not present

## 2023-02-18 DIAGNOSIS — C9110 Chronic lymphocytic leukemia of B-cell type not having achieved remission: Secondary | ICD-10-CM

## 2023-02-18 NOTE — Progress Notes (Signed)
History of Present Illness Travis Wood is a 72 yo M, PMH tobacco use, 10 years, quit in 1990s, currently diagnosis of  CLL which is managed by Dr. Delton Coombes.  Pt was  been referred to see Dr. Valeta Harms  in Feb 2024 for lung mass by Derek Jack, MD .   Synopsis Patient had follow-up CT imaging 12/12/2022  that showed bulky adenopathy within the chest. Patient was referred to Dr. Valeta Harms 01/26/2023 for consideration of bronchoscopy and videobronchoscopy with endobronchial ultrasound and needle aspiration.He underwent biopsy and bronchoscopy 01/28/23  Patient had nuclear medicine pet imaging complete which shows hypermetabolic uptake within station 7.  Bronch and biopsies confirmed stage III mantle cell lymphoma, T p53 negative: FISH testing for translocation (11:14) was positive indicating its mantle cell lymphoma. He is being treated by Dr. Delton Coombes , and has port placement and treatment scheduled.   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  Treatment  Bendamustine and rituximab every 28 days for 6 cycles as he is not a transplant candidate.  Pt. Will have a port placed 02/20/2023 First Dose after port placement scheduled for 03/02/2023  02/18/2023 Pt. Presents for follow up after Flexible video fiberoptic bronchoscopy with endobronchial ultrasound and biopsies on 01/28/2023.Pt. states he has done well after procedure . He did have a headache for a day after the procedure, which self resolved.He states he is back to his baseline . No fever, no hemoptysis, no sore throat. He is playing golf 3 times a week. We discussed his diagnosis. He has been seen by Dr. Delton Coombes, and has treatment planned. He has port placement 3/22, and then first treatment 03/02/2023.  He has a great attitude regarding his diagnosis.  Test Results: Cytology 01/28/2023 LUNG, RT HILAR MASS, FINE NEEDLE ASPIRATION:   -CD5 positive B-cell lymphoproliferative disorder, favor mantle cell  lymphoma,  FISH testing for translocation (11:14) was positive indicating its mantle cell lymphoma.   PET Scan 01/08/2023 Marked right hilar adenopathy for instance a lymph node conglomerate/large lymph node measures 5 cm on image 105/3 with a max SUV of 12.3.   Hypermetabolic bilateral axillary, subpectoral, and mediastinal lymph nodes.   -left axillary lymph node measures 15 mm in short axis on image 87/3 with a max SUV of 6.1   -high right paratracheal lymph node measures 19 mm in short axis on image 87/3 with a max SUV of 9.5   Incidental CT findings: Aortic atherosclerosis. Narrowing of the right lobar bronchi particularly the right middle and upper lobe bronchi by the right hilar adenopathy.  Hypermetabolic adenopathy above and below the diaphragm with splenomegaly, compatible with patient's known lymphocytic leukemia. Diffuse bladder wall thickening. Colonic diverticulosis without findings of acute diverticulitis. Aortic Atherosclerosis (ICD10-I70.0).     CT Chest 12/12/2022 Mediastinum/Nodes: Large right hilar mass or bulky lymphadenopathy, difficult to clearly distinguish from adjacent structures and lymph nodes although measuring at least 7.5 x 6.4 cm and encasing the right mainstem bronchus and lobar branches (series 3, image 76). Additional mediastinal lymphadenopathy, pretracheal nodes measuring at least 4.1 x 2.6 cm (series 3, image 70). Numerous additional enlarged bilateral axillary and lower cervical nodes, left axillary nodes measuring up to 2.9 x 1.6 cm (series 3, image 22). Thyroid gland, trachea, and esophagus demonstrate no significant findings.   Lungs/Pleura: Volume loss and bandlike scarring or atelectasis of the lateral segment right middle lobe with near occlusion of the segmental right  middle lobe bronchi within the right hilum (series 5, image 91). No evidence of fibrotic  interstitial lung disease. Lobular air trapping on expiratory phase imaging. No pleural effusion or pneumothorax.     Latest Ref Rng & Units 01/28/2023    6:20 AM 12/29/2022    2:04 PM 11/12/2022   10:34 AM  CBC  WBC 4.0 - 10.5 K/uL 52.7  54.0  CANCELED   Hemoglobin 13.0 - 17.0 g/dL 11.5  11.6  CANCELED   Hematocrit 39.0 - 52.0 % 35.5  36.7  CANCELED   Platelets 150 - 400 K/uL 77  79  CANCELED        Latest Ref Rng & Units 01/28/2023    6:20 AM 12/29/2022    2:04 PM 11/12/2022   10:34 AM  BMP  Glucose 70 - 99 mg/dL 101  94  93   BUN 8 - 23 mg/dL 19  23  16    Creatinine 0.61 - 1.24 mg/dL 0.79  0.87  0.81   BUN/Creat Ratio 10 - 24   20   Sodium 135 - 145 mmol/L 140  141  143   Potassium 3.5 - 5.1 mmol/L 5.0  4.4  4.4   Chloride 98 - 111 mmol/L 105  107  106   CO2 22 - 32 mmol/L 25  26  24    Calcium 8.9 - 10.3 mg/dL 8.9  8.6  9.1     BNP No results found for: "BNP"  ProBNP No results found for: "PROBNP"  PFT No results found for: "FEV1PRE", "FEV1POST", "FVCPRE", "FVCPOST", "TLC", "DLCOUNC", "PREFEV1FVCRT", "PSTFEV1FVCRT"  No results found.   Past medical hx Past Medical History:  Diagnosis Date   Anemia    Cataract    CLL (chronic lymphocytic leukemia) (HCC)    Erectile dysfunction    History of bronchitis    History of colon polyps    Hyperlipemia    Osteoarthritis    Shortness of breath dyspnea    with exertion     Social History   Tobacco Use   Smoking status: Former    Packs/day: 1.00    Years: 10.00    Additional pack years: 0.00    Total pack years: 10.00    Types: Cigarettes, Pipe    Quit date: 09/10/1991    Years since quitting: 31.4   Smokeless tobacco: Never   Tobacco comments:    Smoked a pipe occasionally for 3 years.  Vaping Use   Vaping Use: Never used  Substance Use Topics   Alcohol use: Not Currently    Alcohol/week: 1.0 standard drink of alcohol    Types: 1 Cans of beer per week   Drug use: Yes    Frequency: 1.0 times per week     Types: Marijuana    Mr.Weidner reports that he quit smoking about 31 years ago. His smoking use included cigarettes and pipe. He has a 10.00 pack-year smoking history. He has never used smokeless tobacco. He reports that he does not currently use alcohol after a past usage of about 1.0 standard drink of alcohol per week. He reports current drug use. Frequency: 1.00 time per week. Drug: Marijuana.  Tobacco Cessation: Former smoker quit 1992 with a 10 pack year smoking history   Past surgical hx, Family hx, Social hx all reviewed.  Current Outpatient Medications on File Prior to Visit  Medication Sig   atorvastatin (LIPITOR) 40 MG tablet Take 1 tablet (40 mg total) by mouth daily.   ferrous sulfate 325 (  65 FE) MG tablet Take 325 mg by mouth daily with breakfast.   Multiple Vitamin (MULTIVITAMIN WITH MINERALS) TABS tablet Take 1 tablet by mouth daily.   OLIVE LEAF EXTRACT PO Take 1 tablet by mouth in the morning and at bedtime.   ondansetron (ZOFRAN) 4 MG tablet Take 1 tablet (4 mg total) by mouth every 8 (eight) hours as needed for nausea or vomiting.   OVER THE COUNTER MEDICATION Take 1 each by mouth 5 (five) times daily. [name]   Vitamin D, Ergocalciferol, (DRISDOL) 1.25 MG (50000 UNIT) CAPS capsule Take 1 capsule (50,000 Units total) by mouth every 7 (seven) days.   Vitamin D, Ergocalciferol, (DRISDOL) 1.25 MG (50000 UNIT) CAPS capsule Take 1 capsule (50,000 Units total) by mouth every 7 (seven) days.   No current facility-administered medications on file prior to visit.     No Known Allergies  Review Of Systems:  Constitutional:   No  weight loss, night sweats,  Fevers, chills, fatigue, or  lassitude.  HEENT:   No headaches,  Difficulty swallowing,  Tooth/dental problems, or  Sore throat,                No sneezing, itching, ear ache, nasal congestion, post nasal drip,   CV:  No chest pain,  Orthopnea, PND, swelling in lower extremities, anasarca, dizziness, palpitations,  syncope.   GI  No heartburn, indigestion, abdominal pain, nausea, vomiting, diarrhea, change in bowel habits, loss of appetite, bloody stools.   Resp: No shortness of breath with exertion or at rest.  No excess mucus, no productive cough,  No non-productive cough,  No coughing up of blood.  No change in color of mucus.  No wheezing.  No chest wall deformity  Skin: no rash or lesions.  GU: no dysuria, change in color of urine, no urgency or frequency.  No flank pain, no hematuria   MS:  No joint pain or swelling.  No decreased range of motion.  No back pain.  Psych:  No change in mood or affect. No depression or anxiety.  No memory loss.   Vital Signs BP (!) 102/58 (BP Location: Left Arm, Patient Position: Sitting, Cuff Size: Normal)   Pulse (!) 54   Temp 98 F (36.7 C) (Oral)   Ht 6\' 3"  (1.905 m)   Wt 200 lb 9.6 oz (91 kg)   SpO2 97%   BMI 25.07 kg/m   BP rechecked by NP after visit >>> 120/68  Physical Exam:  General- No distress,  A&O x 3, pleasant ENT: No sinus tenderness, TM clear, pale nasal mucosa, no oral exudate,no post nasal drip, no LAN Cardiac: S1, S2, regular rate and rhythm, no murmur Chest: No wheeze/ rales/ dullness; no accessory muscle use, no nasal flaring, no sternal retractions Abd.: Soft Non-tender, ND, BS +, Body mass index is 25.07 kg/m.  Ext: No clubbing cyanosis, edema, no obvious deformities Neuro:  normal strength, MAE x 4, A&O x 3, appropriate Skin: No rashes, warm and dry, no lesions  Psych: normal mood and behavior, very positive outlook   Assessment/Plan Post bronchoscopy and videobronchoscopy with endobronchial ultrasound and needle aspiration. 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 -- Treatment planned with  Bendamustine and  rituximab every 28 days for 6 cycles as he is not a  transplant candidate, after port placement  -- All per Dr. Tera Helper. .  I spent 35 minutes dedicated to the care of this patient on the date of this encounter to include pre-visit review of records, face-to-face time with the patient discussing conditions above, post visit ordering of testing, clinical documentation with the electronic health record, making appropriate referrals as documented, and communicating necessary information to the patient's healthcare team.    Magdalen Spatz, NP 02/18/2023  2:10 PM

## 2023-02-18 NOTE — Patient Instructions (Signed)
It is good to see you today. I am glad you are doing well after your bronchoscopy Follow up with Dr. Delton Coombes  as scheduled. Port placement 02/20/2023, then first infusion treatment 03/02/2023.  Call as needed  if you need [place] for anything. Please contact office for sooner follow up if symptoms do not improve or worsen or seek emergency care

## 2023-02-19 ENCOUNTER — Other Ambulatory Visit: Payer: Self-pay | Admitting: Internal Medicine

## 2023-02-19 ENCOUNTER — Other Ambulatory Visit: Payer: Self-pay | Admitting: Student

## 2023-02-19 DIAGNOSIS — C8318 Mantle cell lymphoma, lymph nodes of multiple sites: Secondary | ICD-10-CM

## 2023-02-20 ENCOUNTER — Ambulatory Visit (HOSPITAL_COMMUNITY)
Admission: RE | Admit: 2023-02-20 | Discharge: 2023-02-20 | Disposition: A | Discharge status: Home / Self Care | Payer: Medicare Other | Source: Ambulatory Visit | Attending: Hematology | Admitting: Hematology

## 2023-02-20 ENCOUNTER — Encounter (HOSPITAL_COMMUNITY): Payer: Self-pay

## 2023-02-24 MED ORDER — ALLOPURINOL 300 MG PO TABS: 300.0000 mg | ORAL_TABLET | Freq: Every day | ORAL | 3 refills | Status: DC

## 2023-02-24 MED ORDER — PROCHLORPERAZINE MALEATE 10 MG PO TABS: 10.0000 mg | ORAL_TABLET | Freq: Four times a day (QID) | ORAL | 1 refills | Status: DC | PRN

## 2023-02-24 MED ORDER — LIDOCAINE-PRILOCAINE 2.5-2.5 % EX CREA: TOPICAL_CREAM | CUTANEOUS | 3 refills | Status: DC

## 2023-02-24 NOTE — Patient Instructions (Addendum)
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  You will be treated two days in a row every 28 days and this is called cycle One of your treatment.  On Day 1 you will receive an immunotherapy called Rituxan and a chemotherapy called Bendamustine.  On Day 2 you will receive Bendamustine and go home with a Neulasta OnPro that helps with the production of your white blood cells to fight infections.  The intent for this treatment is Non-Curative/Palliative meaning to help control the disease and alleviate any symptoms you may be having related to the cancer.     You will see the doctor regularly throughout treatment.  We will obtain blood work from you prior to every treatment and monitor your results to make sure it is safe to give your treatment. The doctor monitors your response to treatment by the way you are feeling, your blood work, and by obtaining scans periodically.  There will be wait times while you are here for treatment.  It will take about 30 minutes to 1 hour for your lab work to result.  Then there will be wait times while pharmacy mixes your medications.     Medications you will receive in the clinic prior to your chemotherapy medications:  Aloxi:  ALOXI is used in adults to help prevent nausea and vomiting that happens with certain chemotherapy drugs.  Aloxi is a long acting medication, and will remain in your system for about two days.   Dexamethasone:  This is a steroid given prior to chemotherapy to help prevent allergic reactions; it may also help prevent and control nausea and diarrhea.   Tylenol:  Given to prevent infusion reactions to rituximab.  Benadryl:  Antihistamine given to prevent allergic/infusion reactions to rituximab.     Rituximab (Generic Name) Other Name: Rituxan  About This Drug  Rituximab is a monoclonal antibody used to treat cancer. This drug is given in the vein (IV).  This first time this is  given it will be infused slower to monitor for infusion reactions.    Possible Side Effects (More Common)   Bone marrow depression. This is a decrease in the number of white blood cells, red blood cells, and platelets. This  may raise your risk of infection, make you tired and weak (fatigue), and raise your risk of bleeding.   Rash-skin irritation, redness or itching (dermatitis)   Flu-like symptoms: fever, headache, muscle and joint aches, and fatigue (low energy, feeling weak)   Infusion-related reactions   Hepatitis B - if you have ever had hepatitis B, the virus may come back during treatment with this drug. Your doctor will test to see if you have ever had hepatitis B prior to your treatment.   Changes in your central nervous system can happen. The central nervous system is made up of your brain and spinal cord. You could feel: extreme tiredness, agitation, confusion, or have: hallucinations (see or hear things that are not there), trouble understanding or speaking, loss of control of your bowels or bladder, eyesight changes, numbness or lack of strength to your arms, legs, face, or body, seizures or coma. If you start to have any of these symptoms let your doctor know right away.   Tumor lysis: This drug may act on the cancer cells very quickly. This may affect how your kidneys work. Your doctor will monitor your kidney function.   Changes in your liver function. Your doctor will check  your liver function as needed.   Nausea and throwing up (vomiting): these symptoms may happen within a few hours after your treatment and may last up to 24 hours. Medicines are available to stop or lessen these side effects.   Loose bowel movements (diarrhea) that may last for a few days   Abdominal pain   Infections   Cough, runny nose   Swelling of your legs, ankles and/or feet or hands   High blood pressure. Your doctor will check your blood pressure as needed.   Abnormal heart  beat  Possible Side Effects (Less Common)   Shortness of breath   Soreness of the mouth and throat. You may have red areas, white patches, or sores that hurt.  Infusion Reactions  Infusion Reactions are the most common side effect linked to use of this drug and can be quite severe. Medicines will be given before you get the drug to lower the severity of this side effect. The infusion reactions are the worse with the first dose of the drug and become less severe with more doses of the drug. While you are getting this drug in your vein (IV), tell your nurse right away if you have any of these symptoms of an allergic reaction:   Trouble catching your breath   Feeling like your tongue or throat are swelling   Feeling your heart beat quickly or in a not normal way (palpitations)   Feeling dizzy or lightheaded   Flushing, itching, rash, and/or hives   Treating Side Effects   Ask your doctor or nurse about medicine to stop or lessen headache, loose bowel movements (diarrhea), constipation, nausea, throwing up (vomiting), or pain.   If you get a rash do not put anything it unless your doctor or nurse says you may. Keep the area around the rash clean and dry. Ask your doctor for medicine if the rash bothers you.   Drink 6-8 cups of fluids each day unless your doctor has told you to limit your fluid intake due to some other health problem. A cup is 8 ounces of fluid. If you throw up or have loose bowel movements, you should drink more fluids so that you do not become dehydrated (lack of water in the body from losing too much fluid).   If you are not able to move your bowels, check with your doctor or nurse before you use enemas, laxatives, or suppositories   If you have mouth sores, avoid mouthwash that has alcohol. Also avoid alcohol and smoking because they can bother your mouth and throat.   If you have a nose bleed, sit with your head tipped slightly forward. Apply pressure by lightly  pinching the bridge of your nose between your thumb and forefinger. Call your doctor if you feel dizzy or faint or if the bleeding doesn't stop after 10 to 15 minutes   Important Information   After treatment with this drug, vaccination with live viruses should be delayed until the immune system recovers.   Symptoms of abnormal bleeding may be: coughing up blood, throwing up blood (may look like coffee grounds), red or black, tarry bowel movements, blood in urine, abnormally heavy menstrual flow, nosebleeds, or any unusual bleeding.   Symptoms of high blood pressure may be: headache, blurred vision, confusion, chest pain, or a feeling that your heart is beat differently.   Urinary tract infection. Symptoms may include:   Pain or burning when you pass urine   Feeling like you have to  pass urine often, but not much comes out when you do.   Tender or heavy feeling in your lower abdomen   Cloudy urine and/or urine that smells bad.   Pain on one side of your back under your ribs. This is where your kidneys are.   Fever, chills, nausea and/or throwing up   Food and Drug Interactions  There are no known interactions of rituximab and any food. This drug may interact with other medicines. Tell your doctor and pharmacist about all the medicines and dietary supplements (vitamins, minerals, herbs and others) that you are taking at this time. The safety and use of dietary supplements and alternative diets are often not known. Using these might affect your cancer or interfere with your treatment. Until more is known, you should not use dietary supplements or alternative diets without your cancer doctor's help.   When to Call the Doctor  Call your doctor or nurse right away if you have any of these symptoms:   Fever of 100.4 F (38 C) higher   Chills   Trouble breathing   Rash with or without itching   Blistering or peeling of skin   Chest pain or symptoms of a heart attack. Most heart  attacks involve pain in the center of the chest that lasts more than a few minutes. The pain may go away and come back or it can be constant. It can feel like pressure, squeezing, fullness, or pain. Sometimes pain is felt in one or both arms, the back, neck, jaw, or stomach. If any of these symptoms last 2 minutes, call 911   Easy bleeding or bruising   Blood in urine or bowel movements   Feeling that your heart is beating in a fast or not normal way (palpitations)   Nausea that stops you from eating or drinking   Throwing up/vomiting   Abdominal pain   Loose bowel movements (diarrhea) 4 times in one day or diarrhea with weakness or lightheadedness   No bowel movement in 3 days or if you feel uncomfortable   Feeling dizzy or lightheaded   Changes in your speech or vision   Feeling confused   Weakness of your arms and legs or poor coordination (feeling clumsy)   Signs of liver problems: dark urine, pale bowel movements, bad stomach pain, feeling very tired and weak, unusual itching, or yellowing of skin or eyes   Symptoms of a urinary tract infection (see important information)   Call your doctor or nurse as soon as possible if you have any of these symptoms:   Swelling of your legs, ankles and/or feet   Fatigue and /or weakness that interferes with your daily activities   Joint and muscle pain or muscle spasms that are not relieved by prescribed medicines   Cough that lasts longer than normal   Reproduction Concerns   Pregnancy warning: This drug is known to cross the placenta. This drug may have harmful effects on an unborn baby. Effective methods of birth control should be used during treatment with this drug and for 12 months after the last treatment. If exposure occurs to an unborn baby, the baby's immune system may be affected, which could last for months after birth. Until the immune system recovers, live vaccines should not be administered to the baby. Be sure to  talk with your doctor if you are pregnant or planning to become pregnant while getting this drug.   Breast feeding warning: It is not known if rituximab is  passed into human breast milk. In animal studies, this drug was detected in in breast milk. For this reason, women should talk to their doctor about the risks and benefits of breast feeding during treatment with this drug because this drug may enter the breast milk and badly harm a breast feeding baby.     Bendamustine Donnie Aho, [place])   About This Drug Bendamustine is used to treat cancer. It is given in the vein (IV).  It takes 10 minutes to infuse.    Possible Side Effects  Bone marrow suppression. This is a decrease in the number of white blood cells, red blood cells, and platelets. This may raise your risk of infection, make you tired and weak (fatigue), and raise your risk of bleeding.    Soreness of the mouth and throat. You may have red areas, white patches, or sores that hurt.    Nausea and vomiting (throwing up)    Diarrhea (loose bowel movements)    Constipation (not able to move bowels)    Fever    Tiredness    Changes in your liver function    Decreased appetite (decreased hunger)    Weight loss    Headache    Cough, trouble breathing    Rash   Note: Each of the side effects above was reported in 15% or greater of patients treated with bendamustine. Not all possible side effects are included above.   Warnings and Precautions    Severe bone marrow suppression and infections, which may be life-threatening.    Allergic reactions, including anaphylaxis are rare but may happen in some patients. Signs of allergic reaction to this drug may be swelling of the face, feeling like your tongue or throat are swelling, trouble breathing, rash, itching, fever, chills, feeling dizzy, and/or feeling that your heart is beating in a fast or not normal way. If this happens, do not take another dose of this drug. You should  get urgent medical treatment.      While you are getting this drug in your vein (IV), you may have a reaction to the drug. Sometimes you may be given medication to stop or lessen these side effects. Your nurse will check you closely for these signs: fever or shaking chills, flushing, facial swelling, feeling dizzy, headache, trouble breathing, rash, itching, chest tightness, or chest pain. These reactions may happen after your infusion. If this happens, call 911 for emergency care    Skin and tissue irritation may involve redness, pain, warmth, or swelling at the IV site. This happens if the drug leaks out of the vein and into nearby tissue.    Tumor lysis syndrome: This drug may act on the cancer cells very quickly. This may affect how your kidneys work.    Severe allergic skin reaction, which may be life-threatening. You may develop blisters on your skin that are filled with fluid or a severe red rash all over your body that may be painful.    Severe changes in your liver function, which may be life-threatening.    This drug may raise your risk of getting a second cancer such as leukemia.   Note: Some of the side effects above are very rare. If you have concerns and/or questions, please discuss them with your medical team.   Important Information  This drug may be present in the saliva, tears, sweat, urine, stool, vomit, semen, and vaginal secretions. Talk to your doctor and/or your nurse about the necessary precautions to take during this  time.   Treating Side Effects  Manage tiredness by pacing your activities for the day.    Be sure to include periods of rest between energy-draining activities.    To decrease the risk of infections, wash your hands regularly.    Avoid close contact with people who have a cold, the flu, or other infections.    Take your temperature as your doctor or nurse tells you, and whenever you feel like you may have a fever.    To help decrease the risk of  bleeding, use a soft toothbrush. Check with your nurse before using dental floss.    Be very careful when using knives or tools.    Use an electric shaver instead of a razor.    Drink plenty of fluids (a minimum of eight glasses per day is recommended).    Mouth care is very important. Your mouth care should consist of routine, gentle cleaning of your teeth or dentures and rinsing your mouth with a mixture of 1/2 teaspoon of salt in 8 ounces of water or 1/2 teaspoon of baking soda in 8 ounces of water. This should be done at least after each meal and at bedtime.    If you have mouth sores, avoid mouthwash that has alcohol. Also avoid alcohol and smoking because they can bother your mouth and throat.    To help with nausea and vomiting, eat small, frequent meals instead of three large meals a day. Choose foods and drinks that are at room temperature. Ask your nurse or doctor about other helpful tips and medicine that is available to help stop or lessen these symptoms.    If you throw up or have loose bowel movements, you should drink more fluids so that you do not become dehydrated (lack of water in the body from losing too much fluid).    If you have diarrhea, eat low-fiber foods that are high in protein and calories and avoid foods that can irritate your digestive tracts or lead to cramping.    If you are not able to move your bowels, check with your doctor or nurse before you use enemas, laxatives, or suppositories.    Ask your doctor or nurse about medicines that are available to help stop or lessen constipation and/or diarrhea.    Infusion reactions may happen after your infusion. If this happens, call 911 for emergency care.    To help with weight loss, drink fluids that contribute calories (whole milk, juice, soft drinks, sweetened beverages, milkshakes, and nutritional supplements) instead of water.    To help with decreased appetite, eat small, frequent meals. Eat foods high in  calories and protein, such as meat, poultry, fish, dry beans, tofu, eggs, nuts, milk, yogurt, cheese, ice cream, pudding, and nutritional supplements.    Consider using sauces and spices to increase taste. Daily exercise, with your doctor's approval, may increase your appetite.    Keeping your pain under control is important to your well-being. Please tell your doctor or nurse if you are experiencing pain.    If you get a rash do not put anything on it unless your doctor or nurse says you may. Keep the area around the rash clean and dry. Ask your doctor for medicine if your rash bothers you.   Food and Drug Interactions  There are no known interactions of bendamustine with food.    Check with your doctor or pharmacist about all other prescription medicines and over-the-counter medicines and dietary supplements (vitamins, minerals,  herbs and others) you are taking before starting this medicine as there are known drug interactions with bendamustine. Also, check with your doctor or pharmacist before starting any new prescription or over-the-counter medicines, or dietary supplements to make sure that there are no interactions.   When to Call the Doctor Call your doctor or nurse if you have any of these symptoms and/or any new or unusual symptoms:    Fever of 100.4 F (38 C) or higher    Chills    Tiredness that interferes with your daily activities    Feeling dizzy or lightheaded    Coughing up yellow, green, or bloody mucus    Wheezing or trouble breathing    Headache that does not go away    Easy bleeding or bruising    No bowel movement in 3 days or when you feel uncomfortable.    Diarrhea, 4 times in one day or diarrhea with lack of strength or a feeling of being dizzy    Pain in your mouth or throat that makes it hard to eat or drink    Nausea that stops you from eating or drinking and/or is not relieved by prescribed medicines    Throwing up    Lasting loss of appetite or  rapid weight loss of five pounds in a week    Flu-like symptoms: fever, headache, muscle and joint aches, and fatigue (low energy, feeling weak)    A new rash or a rash that is not relieved by prescribed medicines    Signs of allergic reaction: swelling of the face, feeling like your tongue or throat are swelling, trouble breathing, rash, itching, fever, chills, feeling dizzy, and/or feeling that your heart is beating in a fast or not normal way. If this happens, call 911 for emergency care.    Signs of infusion reaction: fever or shaking chills, flushing, facial swelling, feeling dizzy, headache, trouble breathing, rash, itching, chest tightness, or chest pain. If this happens, call 911 for emergency care.    While you are getting this drug, please tell your nurse right away if you have any pain, redness, or swelling at the site of the IV infusion.    Signs of possible liver problems: dark urine, pale bowel movements, bad stomach pain, feeling very tired and weak, unusual itching, or yellowing of the eyes or skin    Signs of tumor lysis: confusion or agitation, decreased urine, nausea/vomiting, diarrhea, muscle cramping, numbness and/or tingling, seizures    If you think you may be pregnant, or may have impregnated your partner   Reproduction Warnings    Pregnancy warning: This drug can have harmful effects on the unborn baby. Women of childbearing potential should use effective methods of birth control during your cancer treatment and for at least 6 months after treatment. Men with male partners of childbearing potential should use effective methods of birth control during your cancer treatment and for at least 3 months after your cancer treatment. Let your doctor know right away if you think you may be pregnant or may have impregnated your partner.    Breastfeeding warning: It is not known if this drug passes into breast milk. For this reason, women should not breastfeed during treatment  and for at least 1 week after treatment because this drug could enter the breast milk and cause harm to a breastfeeding baby.    Fertility warning: In men this drug may affect your ability to have children in the future. Talk with your doctor  or nurse if you plan to have children. Ask for information on sperm banking.     SELF CARE ACTIVITIES WHILE ON CHEMOTHERAPY/IMMUNOTHERAPY:  Hydration Increase your fluid intake 48 hours prior to treatment and drink at least 8 to 12 cups (64 ounces) of water/decaffeinated beverages per day after treatment. You can still have your cup of coffee or soda but these beverages do not count as part of your 8 to 12 cups that you need to drink daily. No alcohol intake.  Medications Continue taking your normal prescription medication as prescribed.  If you start any new herbal or new supplements please let us know first to make sure it is safe.  Mouth Care Have teeth cleaned professionally before starting treatment. Keep dentures and partial plates clean. Use soft toothbrush and do not use mouthwashes that contain alcohol. Biotene is a good mouthwash that is available at most pharmacies or may be ordered by calling [phone]. Use warm salt water gargles (1 teaspoon salt per 1 quart warm water) before and after meals and at bedtime. Or you may rinse with 2 tablespoons of three-percent hydrogen peroxide mixed in eight ounces of water. If you are still having problems with your mouth or sores in your mouth please call the clinic. If you need dental work, please let the doctor know before you go for your appointment so that we can coordinate the best possible time for you in regards to your chemo regimen. You need to also let your dentist know that you are actively taking chemo. We may need to do labs prior to your dental appointment.  Skin Care Always use sunscreen that has not expired and with SPF (Sun Protection Factor) of 50 or higher. Wear hats to protect your  head from the sun. Remember to use sunscreen on your hands, ears, face, & feet.  Use good moisturizing lotions such as udder cream, eucerin, or even Vaseline. Some chemotherapies can cause dry skin, color changes in your skin and nails.    Avoid long, hot showers or baths. Use gentle, fragrance-free soaps and laundry detergent. Use moisturizers, preferably creams or ointments rather than lotions because the thicker consistency is better at preventing skin dehydration. Apply the cream or ointment within 15 minutes of showering. Reapply moisturizer at night, and moisturize your hands every time after you wash them.   Infection Prevention Please wash your hands for at least 30 seconds using warm soapy water. Handwashing is the #1 way to prevent the spread of germs. Stay away from sick people or people who are getting over a cold. If you develop respiratory systems such as green/yellow mucus production or productive cough or persistent cough let us know and we will see if you need an antibiotic. It is a good idea to keep a pair of gloves on when going into grocery stores/[employer] to decrease your risk of coming into contact with germs on the carts, etc. Carry alcohol hand gel with you at all times and use it frequently if out in public. If your temperature reaches 100.5 or higher please call the clinic and let us know.  If it is after hours or on the weekend please go to the ER if your temperature is over 100.4.  Please have your own personal thermometer at home to use.    Sex and bodily fluids If you are going to have sex, a condom must be used to protect the person that isn't taking immunotherapy. For a few days after treatment, immunotherapy can  be excreted through your bodily fluids.  When using the toilet please close the lid and flush the toilet twice.  Do this for a few day after you have had immunotherapy.   Contraception It is not known for sure whether or not immunotherapy drugs can be passed on  through semen or secretions from the vagina. Because of this some doctors advise people to use a barrier method if you have sex during treatment. This applies to vaginal, anal or oral sex.  Generally, doctors advise a barrier method only for the time you are actually having the treatment and for about a week after your treatment.  Advice like this can be worrying, but this does not mean that you have to avoid being intimate with your partner. You can still have close contact with your partner and continue to enjoy sex.  Animals If you have cats or birds we just ask that you not change the litter or change the cage.  Please have someone else do this for you while you are on immunotherapy.   Food Safety During and After Cancer Treatment Food safety is important for people both during and after cancer treatment. Cancer and cancer treatments, such as chemotherapy, radiation therapy, and stem cell/bone marrow transplantation, often weaken the immune system. This makes it harder for your body to protect itself from foodborne illness, also called food poisoning. Foodborne illness is caused by eating food that contains harmful bacteria, parasites, or viruses.  Foods to avoid Some foods have a higher risk of becoming tainted with bacteria. These include: Unwashed fresh fruit and vegetables, especially leafy vegetables that can hide dirt and other contaminants Raw sprouts, such as alfalfa sprouts Raw or undercooked beef, especially ground beef, or other raw or undercooked meat and poultry Fatty, fried, or spicy foods immediately before or after treatment.  These can sit heavy on your stomach and make you feel nauseous. Raw or undercooked shellfish, such as oysters. Sushi and sashimi, which often contain raw fish.  Unpasteurized beverages, such as unpasteurized fruit juices, raw milk, raw yogurt, or cider Undercooked eggs, such as soft boiled, over easy, and poached; raw, unpasteurized eggs; or foods made  with raw egg, such as homemade raw cookie dough and homemade mayonnaise  Simple steps for food safety  Shop smart. Do not buy food stored or displayed in an unclean area. Do not buy bruised or damaged fruits or vegetables. Do not buy cans that have cracks, dents, or bulges. Pick up foods that can spoil at the end of your shopping trip and store them in a cooler on the way home.  Prepare and clean up foods carefully. Rinse all fresh fruits and vegetables under running water, and dry them with a clean towel or paper towel. Clean the top of cans before opening them. After preparing food, wash your hands for 20 seconds with hot water and soap. Pay special attention to areas between fingers and under nails. Clean your utensils and dishes with hot water and soap. Disinfect your kitchen and cutting boards using 1 teaspoon of liquid, unscented bleach mixed into 1 quart of water.    Dispose of old food. Eat canned and packaged food before its expiration date (the "use by" or "best before" date). Consume refrigerated leftovers within 3 to 4 days. After that time, throw out the food. Even if the food does not smell or look spoiled, it still may be unsafe. Some bacteria, such as Listeria, can grow even on foods stored in the  refrigerator if they are kept for too long.  Take precautions when eating out. At restaurants, avoid buffets and salad bars where food sits out for a long time and comes in contact with many people. Food can become contaminated when someone with a virus, often a norovirus, or another "bug" handles it. Put any leftover food in a "to-go" container yourself, rather than having the server do it. And, refrigerate leftovers as soon as you get home. Choose restaurants that are clean and that are willing to prepare your food as you order it cooked.    SYMPTOMS TO REPORT AS SOON AS POSSIBLE AFTER TREATMENT:  FEVER GREATER THAN 100.4 F CHILLS WITH OR WITHOUT FEVER NAUSEA AND VOMITING  THAT IS NOT CONTROLLED WITH YOUR NAUSEA MEDICATION UNUSUAL SHORTNESS OF BREATH UNUSUAL BRUISING OR BLEEDING TENDERNESS IN MOUTH AND THROAT WITH OR WITHOUT PRESENCE OF ULCERS URINARY PROBLEMS BOWEL PROBLEMS UNUSUAL RASH     Wear comfortable clothing and clothing appropriate for easy access to any Portacath or PICC line. Let us know if there is anything that we can do to make your therapy better!   What to do if you need assistance after hours or on the weekends: CALL [phone].  HOLD on the line, do not hang up.  You will hear multiple messages but at the end you will be connected with a nurse triage line.  They will contact the doctor if necessary.  Most of the time they will be able to assist you.  Do not call the hospital operator.    I have been informed and understand all of the instructions given to me and have received a copy. I have been instructed to call the clinic [phone] or my family physician as soon as possible for continued medical care, if indicated. I do not have any more questions at this time but understand that I may call the [facility] or the Patient Navigator at [phone] during office hours should I have questions or need assistance in obtaining follow-up care.

## 2023-02-24 NOTE — Progress Notes (Signed)
Pharmacist Chemotherapy Monitoring - Initial Assessment    Anticipated start date: 03/02/23   The following has been reviewed per standard work regarding the patient's treatment regimen: The patient's diagnosis, treatment plan and drug doses, and organ/hematologic function Lab orders and baseline tests specific to treatment regimen  The treatment plan start date, drug sequencing, and pre-medications Prior authorization status  Patient's documented medication list, including drug-drug interaction screen and prescriptions for anti-emetics and supportive care specific to the treatment regimen The drug concentrations, fluid compatibility, administration routes, and timing of the medications to be used The patient's access for treatment and lifetime cumulative dose history, if applicable  The patient's medication allergies and previous infusion related reactions, if applicable   Changes made to treatment plan:  Premeds - updated to Discontinue diphenhydramine from oncology treatment plan --> Add Quzyttir (cetirizine) 10 mg IVPush x 1 as premedication for oncology treatment plan.   Follow up needed:  N/A   Wynona Neat, [facility], 02/24/2023  3:09 PM

## 2023-02-26 ENCOUNTER — Inpatient Hospital Stay: Payer: Medicare Other

## 2023-02-26 DIAGNOSIS — C8318 Mantle cell lymphoma, lymph nodes of multiple sites: Secondary | ICD-10-CM

## 2023-02-26 DIAGNOSIS — C9110 Chronic lymphocytic leukemia of B-cell type not having achieved remission: Secondary | ICD-10-CM | POA: Diagnosis not present

## 2023-02-26 LAB — CBC WITH DIFFERENTIAL/PLATELET
Abs Immature Granulocytes: 0 10*3/uL (ref 0.00–0.07)
Basophils Absolute: 0 10*3/uL (ref 0.0–0.1)
Basophils Relative: 0 %
Eosinophils Absolute: 0 10*3/uL (ref 0.0–0.5)
Eosinophils Relative: 0 %
HCT: 35.4 % — ABNORMAL LOW (ref 39.0–52.0)
Hemoglobin: 11.1 g/dL — ABNORMAL LOW (ref 13.0–17.0)
Lymphocytes Relative: 95 %
Lymphs Abs: 41 10*3/uL — ABNORMAL HIGH (ref 0.7–4.0)
MCH: 29.5 pg (ref 26.0–34.0)
MCHC: 31.4 g/dL (ref 30.0–36.0)
MCV: 94.1 fL (ref 80.0–100.0)
Monocytes Absolute: 0.4 10*3/uL (ref 0.1–1.0)
Monocytes Relative: 1 %
Neutro Abs: 1.7 10*3/uL (ref 1.7–7.7)
Neutrophils Relative %: 4 %
Platelets: 71 10*3/uL — ABNORMAL LOW (ref 150–400)
RBC: 3.76 MIL/uL — ABNORMAL LOW (ref 4.22–5.81)
RDW: 15.2 % (ref 11.5–15.5)
WBC Morphology: >10
WBC: 43.2 10*3/uL — ABNORMAL HIGH (ref 4.0–10.5)
nRBC: 0 % (ref 0.0–0.2)

## 2023-02-26 LAB — COMPREHENSIVE METABOLIC PANEL
ALT: 16 U/L (ref 0–44)
AST: 17 U/L (ref 15–41)
Albumin: 3.9 g/dL (ref 3.5–5.0)
Alkaline Phosphatase: 77 U/L (ref 38–126)
Anion gap: 5 (ref 5–15)
BUN: 20 mg/dL (ref 8–23)
CO2: 28 mmol/L (ref 22–32)
Calcium: 8.7 mg/dL — ABNORMAL LOW (ref 8.9–10.3)
Chloride: 105 mmol/L (ref 98–111)
Creatinine, Ser: 0.8 mg/dL (ref 0.61–1.24)
GFR, Estimated: >60 mL/min (ref >60)
Glucose, Bld: 125 mg/dL — ABNORMAL HIGH (ref 70–99)
Potassium: 4.6 mmol/L (ref 3.5–5.1)
Sodium: 138 mmol/L (ref 135–145)
Total Bilirubin: 0.7 mg/dL (ref 0.3–1.2)
Total Protein: 5.9 g/dL — ABNORMAL LOW (ref 6.5–8.1)

## 2023-02-26 LAB — PHOSPHORUS: Phosphorus: 3.8 mg/dL (ref 2.5–4.6)

## 2023-02-26 LAB — URIC ACID: Uric Acid, Serum: 6.1 mg/dL (ref 3.7–8.6)

## 2023-02-26 LAB — MAGNESIUM: Magnesium: 2.1 mg/dL (ref 1.7–2.4)

## 2023-02-26 LAB — LACTATE DEHYDROGENASE: LDH: 134 U/L (ref 98–192)

## 2023-02-26 NOTE — Progress Notes (Signed)

## 2023-03-01 NOTE — Progress Notes (Signed)
[facility] [address], [place] [address]    Clinic Day:  03/02/2023  Referring physician: Derek Jack, MD  Patient Care Team: Travis Jack, MD as PCP - General (Hematology) Travis Jack, MD as Medical Oncologist (Medical Oncology) Brien Mates, RN as Oncology Nurse Navigator (Medical Oncology)   ASSESSMENT & PLAN:   Assessment: 1.  Chronic lymphocytic leukemia: - Flow cytometry (01/26/2019): Monoclonal B-cell population consistent with CLL. - CT chest (12/12/2022): Large right hilar mass/bulky adenopathy measuring 7.5 x 6.4 cm encasing the right mainstem bronchus and lobar branches.  Additional mediastinal adenopathy, axillary and lower cervical adenopathy. - CT [facility] (12/12/2022): Severe splenomegaly measuring 21.5 cm.  Prominent retroperitoneal lymph nodes, left retroperitoneal nodes measuring 1.6 x 1.0 cm.  Subcapsular hypodense lesion of the peripheral liver dome measuring 1.6 x 1.1 cm.  Additional subcentimeter hypodense lesions too small to characterize. - No B symptoms.  Patient plays golf 3-4 times per week.  No recurrent infections. - CLL FISH panel with 13 q. deletion - T p53 mutation negative - IGHV hyper mutation present. - PET scan (01/08/2023): Marked right hilar adenopathy with lymph node conglomerate/large lymph node measuring 5 cm with SUV of 12.3.  There is hypermetabolic adenopathy above and below diaphragm with splenomegaly. - Bronchoscopy (01/28/2023) and biopsy of the right hilar mass by Dr. Valeta Harms - Pathology: CD5 positive B-cell lymphoproliferative disorder, cyclin D1 positive, favoring mantle cell lymphoma.  FISH for t(11:14) positive indicating mantle cell lymphoma. - Cycle 1 of Bendamustine and rituximab started on 03/02/2023   2.  Social/family history: - Lives at home with his girlfriend.  Independent of ADLs and IADLs. - He retired 8 years ago after working at [employer] in [place].  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  Quit smoking in 1992.  Smoked 1 pack/day for 10 years. - Sister had breast cancer.  Another sister had ovarian and stomach cancer.  Father had colon cancer.  Mother had CLL.  Paternal aunt had lung cancer.  Another paternal aunt had breast cancer.  Plan: 1.  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  Electrolytes are normal.  CBC shows white count 43 and platelet count 71.  Hemoglobin 11.1. - Discussed chemotherapy with Bendamustine and rituximab for 6 cycles. - Discussed side effects in detail including allergic reactions. - Will start him on Bendamustine 70 mg/m. - He will be evaluated in our symptom management clinic in 2 weeks.  RTC 4 weeks prior to cycle 2 with me.   2.  TLS prophylaxis: - He will receive rasburicase 6 mg today. - He will start allopurinol 300 mg daily.  Addendum: 25 to 30 minutes after starting rituximab, he developed hypotension.  We have stopped the infusion and give him a bolus of normal saline.  After his blood pressure improved, he was started back on rituximab without any problems.  Orders Placed This Encounter  Procedures   Magnesium    Standing Status:   Future    Standing Expiration Date:   03/29/2024   CBC with Differential    Standing Status:   Future    Standing Expiration Date:   03/29/2024   Comprehensive metabolic panel    Standing Status:   Future    Standing Expiration Date:   03/29/2024   Magnesium    Standing Status:   Future    Standing Expiration Date:  04/26/2024   CBC with Differential    Standing Status:   Future    Standing Expiration Date:   04/26/2024   Comprehensive metabolic panel    Standing Status:   Future    Standing Expiration Date:   04/26/2024   Magnesium    Standing Status:   Future    Standing Expiration Date:   05/24/2024   CBC with Differential    Standing Status:   Future    Standing Expiration Date:    05/24/2024   Comprehensive metabolic panel    Standing Status:   Future    Standing Expiration Date:   05/24/2024   Magnesium    Standing Status:   Future    Standing Expiration Date:   06/21/2024   CBC with Differential    Standing Status:   Future    Standing Expiration Date:   06/21/2024   Comprehensive metabolic panel    Standing Status:   Future    Standing Expiration Date:   06/21/2024   Magnesium    Standing Status:   Future    Standing Expiration Date:   07/19/2024   CBC with Differential    Standing Status:   Future    Standing Expiration Date:   07/19/2024   Comprehensive metabolic panel    Standing Status:   Future    Standing Expiration Date:   07/19/2024   Uric acid    Standing Status:   Standing    Number of Occurrences:   6    Standing Expiration Date:   03/01/2024    Order Specific Question:   Release to patient    Answer:   Immediate   CBC with Differential    Standing Status:   Future    Standing Expiration Date:   03/01/2024   Comprehensive metabolic panel    Standing Status:   Future    Standing Expiration Date:   03/01/2024   Magnesium    Standing Status:   Future    Standing Expiration Date:   03/01/2024   Phosphorus    Standing Status:   Future    Standing Expiration Date:   03/01/2024      I,Katie Daubenspeck,acting as a scribe for Travis Jack, MD.,have documented all relevant documentation on the behalf of Travis Jack, MD,as directed by  Travis Jack, MD while in the presence of Travis Jack, MD.   I, Travis Jack MD, have reviewed the above documentation for accuracy and completeness, and I agree with the above.   Travis Jack, MD   4/1/20245:57 PM  CHIEF COMPLAINT:   Diagnosis: Generalized lymphadenopathy and CLL    Cancer Staging  Mantle cell lymphoma Staging form: Hodgkin and Non-Hodgkin Lymphoma, AJCC 8th Edition - Clinical stage from 02/11/2023: Stage III (Mantle cell lymphoma) - Signed by Travis Jack, MD on 02/11/2023    Prior Therapy: none  Current Therapy:  to start Bendamustine and rituximab every 28 days for 6 cycles   HISTORY OF PRESENT ILLNESS:   Oncology History  Mantle cell lymphoma  02/11/2023 Initial Diagnosis   Mantle cell lymphoma ([facility])   02/11/2023 Cancer Staging   Staging form: Hodgkin and Non-Hodgkin Lymphoma, AJCC 8th Edition - Clinical stage from 02/11/2023: Stage III (Mantle cell lymphoma) - Signed by Travis Jack, MD on 02/11/2023 Histopathologic type: Mantle cell lymphoma (Includes all variants: blastic, pleomorphic, small cell) Stage prefix: Initial diagnosis   03/02/2023 -  Chemotherapy   Patient is on Treatment Plan : NON-HODGKINS LYMPHOMA Rituximab D1 +  Bendamustine D1,2 q28d x 6 cycles        INTERVAL HISTORY:   Travis Wood is a 72 y.o. male presenting to clinic today for follow up of generalized lymphadenopathy and CLL. He was last seen by me on 02/04/23 with a phone visit on 02/11/23.  Since his last visit, he underwent port placement on 02/20/23. Of note, he also attended chemotherapy education on 02/26/23.  Today, he states that he is doing well overall. His appetite level is at 100%. His energy level is at 100%.  PAST MEDICAL HISTORY:   Past Medical History: Past Medical History:  Diagnosis Date   Anemia    Cataract    CLL (chronic lymphocytic leukemia) (HCC)    Erectile dysfunction    History of bronchitis    History of colon polyps    Hyperlipemia    Osteoarthritis    Shortness of breath dyspnea    with exertion    Surgical History: Past Surgical History:  Procedure Laterality Date   APPENDECTOMY     BRONCHIAL NEEDLE ASPIRATION BIOPSY  01/28/2023   Procedure: BRONCHIAL NEEDLE ASPIRATION BIOPSIES;  Surgeon: Garner Nash, DO;  Location: [facility];  Service: Cardiopulmonary;;   CATARACT EXTRACTION W/PHACO Left 10/15/2015   Procedure: CATARACT EXTRACTION PHACO AND INTRAOCULAR LENS PLACEMENT LEFT EYE;  Surgeon: Tonny Branch,  MD;  Location: [facility];  Service: Ophthalmology;  Laterality: Left;  CDE:12.20   CATARACT EXTRACTION W/PHACO Right 11/12/2015   Procedure: CATARACT EXTRACTION PHACO AND INTRAOCULAR LENS PLACEMENT RIGHT EYE:  CDE:  8.99;  Surgeon: Tonny Branch, MD;  Location: [facility];  Service: Ophthalmology;  Laterality: Right;   KNEE SURGERY     Left x 2    MASS EXCISION N/A 09/12/2015   Procedure: EXCISION SOFT TISSUE NEOPLASM (6 CM), BACK;  Surgeon: Aviva Signs, MD;  Location: [facility];  Service: General;  Laterality: N/A;   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   Procedure: TOTAL KNEE ARTHROPLASTY;  Surgeon: Paralee Cancel, MD;  Location: [facility];  Service: Orthopedics;  Laterality: Right;  70 mins   VIDEO BRONCHOSCOPY WITH ENDOBRONCHIAL ULTRASOUND Bilateral 01/28/2023   Procedure: VIDEO BRONCHOSCOPY WITH ENDOBRONCHIAL ULTRASOUND;  Surgeon: Garner Nash, DO;  Location: [facility];  Service: Cardiopulmonary;  Laterality: Bilateral;    Social History: Social History   Socioeconomic History   Marital status: Soil scientist    Spouse name: Not on file   Number of children: 2   Years of education: 12 years   Highest education level: 12th grade  Occupational History   Occupation: RETIRED  Tobacco Use   Smoking status: Former    Packs/day: 1.00    Years: 10.00    Additional pack years: 0.00    Total pack years: 10.00    Types: Cigarettes, Pipe    Quit date: 09/10/1991    Years since quitting: 31.4   Smokeless tobacco: Never   Tobacco comments:    Smoked a pipe occasionally for 3 years.  Vaping Use   Vaping Use: Never used  Substance and Sexual Activity   Alcohol use: Not Currently    Alcohol/week: 1.0 standard drink of alcohol    Types: 1 Cans of beer per week   Drug use: Yes    Frequency: 1.0 times per week    Types: Marijuana   Sexual activity: Yes  Other Topics Concern   Not on file  Social History Narrative   2 sons who live close by   Lives with girlfriend   Social Determinants  of Health   Financial Resource Strain: Low Risk  (11/06/2022)   Overall Financial Resource Strain (CARDIA)    Difficulty of Paying Living Expenses: Not hard at all  Food Insecurity: No Food Insecurity (12/29/2022)   Hunger Vital Sign    Worried About Running Out of Food in the Last Year: Never true    Ran Out of Food in the Last Year: Never true  Transportation Needs: No Transportation Needs (11/06/2022)   PRAPARE - Hydrologist (Medical): No    Lack of Transportation (Non-Medical): No  Physical Activity: Sufficiently Active (11/06/2022)   Exercise Vital Sign    Days of Exercise per Week: 7 days    Minutes of Exercise per Session: 30 min  Stress: No Stress Concern Present (11/06/2022)   [place]    Feeling of Stress : Not at all  Social Connections: Socially Integrated (11/06/2022)   Social Connection and Isolation Panel [NHANES]    Frequency of Communication with Friends and Family: More than three times a week    Frequency of Social Gatherings with Friends and Family: More than three times a week    Attends Religious Services: 1 to 4 times per year    Active Member of [employer] or Organizations: Yes    Attends Music therapist: More than 4 times per year    Marital Status: Living with partner  Intimate Partner Violence: Not At Risk (12/29/2022)   Humiliation, Afraid, Rape, and Kick questionnaire    Fear of Current or Ex-Partner: No    Emotionally Abused: No    Physically Abused: No    Sexually Abused: No    Family History: Family History  Problem Relation Age of Onset   Colon cancer Father        age 92    Colon polyps Father    Heart disease Father        Heart failure   Heart disease Mother    Cancer Sister        Uterine, and ovarian   Hypertension Brother    Obesity Sister    Heart disease Maternal Grandmother    Esophageal cancer Neg Hx    Liver cancer Neg Hx     Pancreatic cancer Neg Hx    Rectal cancer Neg Hx    Stomach cancer Neg Hx     Current Medications:  Current Outpatient Medications:    allopurinol (ZYLOPRIM) 300 MG tablet, Take 1 tablet (300 mg total) by mouth daily., Disp: 30 tablet, Rfl: 3   atorvastatin (LIPITOR) 40 MG tablet, Take 1 tablet (40 mg total) by mouth daily., Disp: 90 tablet, Rfl: 3   BENDAMUSTINE HCL IV, Inject into the vein every 28 (twenty-eight) days. Days 1 and 2 every 28 days, Disp: , Rfl:    ferrous sulfate 325 (65 FE) MG tablet, Take 325 mg by mouth daily with breakfast., Disp: , Rfl:    lidocaine-prilocaine (EMLA) cream, Apply to affected area once, Disp: 30 g, Rfl: 3   Multiple Vitamin (MULTIVITAMIN WITH MINERALS) TABS tablet, Take 1 tablet by mouth daily., Disp: , Rfl:    OLIVE LEAF EXTRACT PO, Take 1 tablet by mouth in the morning and at bedtime., Disp: , Rfl:    ondansetron (ZOFRAN) 4 MG tablet, Take 1 tablet (4 mg total) by mouth every 8 (eight) hours as needed for nausea or vomiting., Disp: 20 tablet, Rfl: 0   OVER THE COUNTER MEDICATION,  Take 1 each by mouth 5 (five) times daily. [name], Disp: , Rfl:    prochlorperazine (COMPAZINE) 10 MG tablet, Take 1 tablet (10 mg total) by mouth every 6 (six) hours as needed for nausea or vomiting., Disp: 30 tablet, Rfl: 1   riTUXimab (RITUXAN IV), Inject into the vein every 28 (twenty-eight) days., Disp: , Rfl:    Vitamin D, Ergocalciferol, (DRISDOL) 1.25 MG (50000 UNIT) CAPS capsule, Take 1 capsule (50,000 Units total) by mouth every 7 (seven) days., Disp: 13 capsule, Rfl: 3   Vitamin D, Ergocalciferol, (DRISDOL) 1.25 MG (50000 UNIT) CAPS capsule, Take 1 capsule (50,000 Units total) by mouth every 7 (seven) days., Disp: 13 capsule, Rfl: 3   Allergies: No Known Allergies  REVIEW OF SYSTEMS:   Review of Systems  Constitutional:  Negative for chills, fatigue and fever.  HENT:   Negative for lump/mass, mouth sores, nosebleeds, sore throat and trouble swallowing.    Eyes:  Negative for eye problems.  Respiratory:  Negative for cough and shortness of breath.   Cardiovascular:  Negative for chest pain, leg swelling and palpitations.  Gastrointestinal:  Negative for abdominal pain, constipation, diarrhea, nausea and vomiting.  Genitourinary:  Negative for bladder incontinence, difficulty urinating, dysuria, frequency, hematuria and nocturia.   Musculoskeletal:  Negative for arthralgias, back pain, flank pain, myalgias and neck pain.  Skin:  Negative for itching and rash.  Neurological:  Negative for dizziness, headaches and numbness.  Hematological:  Does not bruise/bleed easily.  Psychiatric/Behavioral:  Negative for depression, sleep disturbance and suicidal ideas. The patient is not nervous/anxious.   All other systems reviewed and are negative.    VITALS:   There were no vitals taken for this visit.  Wt Readings from Last 3 Encounters:  03/02/23 201 lb (91.2 kg)  02/18/23 200 lb 9.6 oz (91 kg)  02/04/23 196 lb 6.4 oz (89.1 kg)    There is no height or weight on file to calculate BMI.  Performance status (ECOG): 1 - Symptomatic but completely ambulatory  PHYSICAL EXAM:   Physical Exam Vitals and nursing note reviewed. Exam conducted with a chaperone present.  Constitutional:      Appearance: Normal appearance.  Cardiovascular:     Rate and Rhythm: Normal rate and regular rhythm.     Pulses: Normal pulses.     Heart sounds: Normal heart sounds.  Pulmonary:     Effort: Pulmonary effort is normal.     Breath sounds: Normal breath sounds.  Abdominal:     Palpations: Abdomen is soft. There is no hepatomegaly, splenomegaly or mass.     Tenderness: There is no abdominal tenderness.  Musculoskeletal:     Right lower leg: No edema.     Left lower leg: No edema.  Lymphadenopathy:     Cervical: No cervical adenopathy.     Right cervical: No superficial, deep or posterior cervical adenopathy.    Left cervical: No superficial, deep or  posterior cervical adenopathy.     Upper Body:     Right upper body: No supraclavicular or axillary adenopathy.     Left upper body: No supraclavicular or axillary adenopathy.  Neurological:     General: No focal deficit present.     Mental Status: He is alert and oriented to person, place, and time.  Psychiatric:        Mood and Affect: Mood normal.        Behavior: Behavior normal.     LABS:      Latest  Ref Rng & Units 02/26/2023   12:49 PM 01/28/2023    6:20 AM 12/29/2022    2:04 PM  CBC  WBC 4.0 - 10.5 K/uL 43.2  52.7  54.0   Hemoglobin 13.0 - 17.0 g/dL 11.1  11.5  11.6   Hematocrit 39.0 - 52.0 % 35.4  35.5  36.7   Platelets 150 - 400 K/uL 71  77  79       Latest Ref Rng & Units 02/26/2023   12:49 PM 01/28/2023    6:20 AM 12/29/2022    2:04 PM  CMP  Glucose 70 - 99 mg/dL 125  101  94   BUN 8 - 23 mg/dL 20  19  23    Creatinine 0.61 - 1.24 mg/dL 0.80  0.79  0.87   Sodium 135 - 145 mmol/L 138  140  141   Potassium 3.5 - 5.1 mmol/L 4.6  5.0  4.4   Chloride 98 - 111 mmol/L 105  105  107   CO2 22 - 32 mmol/L 28  25  26    Calcium 8.9 - 10.3 mg/dL 8.7  8.9  8.6   Total Protein 6.5 - 8.1 g/dL 5.9   6.4   Total Bilirubin 0.3 - 1.2 mg/dL 0.7   0.8   Alkaline Phos 38 - 126 U/L 77   80   AST 15 - 41 U/L 17   22   ALT 0 - 44 U/L 16   16      No results found for: "CEA1", "CEA" / No results found for: "CEA1", "CEA" Lab Results  Component Value Date   PSA1 0.6 06/06/2020   No results found for: "[id number]" No results found for: "CAN125"  Lab Results  Component Value Date   TOTALPROTELP 6.0 01/26/2019   ALBUMINELP 3.9 01/26/2019   A1GS 0.2 01/26/2019   A2GS 0.5 01/26/2019   BETS 0.9 01/26/2019   GAMS 0.5 01/26/2019   MSPIKE Not Observed 01/26/2019   SPEI Comment 01/26/2019   Lab Results  Component Value Date   TIBC 310 08/21/2021   FERRITIN 421 (H) 08/21/2021   IRONPCTSAT 20 08/21/2021   Lab Results  Component Value Date   LDH 134 02/26/2023   LDH 140 12/29/2022    LDH 143 10/01/2020     STUDIES:   No results found.

## 2023-03-02 ENCOUNTER — Inpatient Hospital Stay: Payer: Medicare Other | Admitting: Hematology

## 2023-03-02 ENCOUNTER — Inpatient Hospital Stay: Payer: Medicare Other | Admitting: Dietician

## 2023-03-02 ENCOUNTER — Inpatient Hospital Stay: Payer: Medicare Other | Attending: Hematology

## 2023-03-02 VITALS — BP 116/56 | HR 84 | Temp 99.1°F | Resp 18 | Wt 201.0 lb

## 2023-03-02 DIAGNOSIS — Z5189 Encounter for other specified aftercare: Secondary | ICD-10-CM | POA: Insufficient documentation

## 2023-03-02 DIAGNOSIS — Z79620 Long term (current) use of immunosuppressive biologic: Secondary | ICD-10-CM | POA: Diagnosis not present

## 2023-03-02 DIAGNOSIS — Z5112 Encounter for antineoplastic immunotherapy: Secondary | ICD-10-CM | POA: Insufficient documentation

## 2023-03-02 DIAGNOSIS — C8318 Mantle cell lymphoma, lymph nodes of multiple sites: Secondary | ICD-10-CM

## 2023-03-02 DIAGNOSIS — Z5111 Encounter for antineoplastic chemotherapy: Secondary | ICD-10-CM | POA: Diagnosis present

## 2023-03-02 DIAGNOSIS — C8310 Mantle cell lymphoma, unspecified site: Secondary | ICD-10-CM | POA: Insufficient documentation

## 2023-03-02 MED ORDER — SODIUM CHLORIDE 0.9 % IV SOLN: Freq: Once | INTRAVENOUS | Status: AC

## 2023-03-02 MED ORDER — SODIUM CHLORIDE 0.9 % IV SOLN
375.0000 mg/m2 | Freq: Once | INTRAVENOUS | Status: AC
  Administered 2023-03-02: 800 mg via INTRAVENOUS
  Filled 2023-03-02: qty 50

## 2023-03-02 MED ORDER — SODIUM CHLORIDE 0.9 % IV SOLN
10.0000 mg | Freq: Once | INTRAVENOUS | Status: AC
  Administered 2023-03-02: 10 mg via INTRAVENOUS
  Filled 2023-03-02: qty 10

## 2023-03-02 MED ORDER — SODIUM CHLORIDE 0.9 % IV SOLN
6.0000 mg | Freq: Once | INTRAVENOUS | Status: AC
  Administered 2023-03-02: 6 mg via INTRAVENOUS
  Filled 2023-03-02: qty 4

## 2023-03-02 MED ORDER — PALONOSETRON HCL INJECTION 0.25 MG/5ML
0.2500 mg | Freq: Once | INTRAVENOUS | Status: AC
  Administered 2023-03-02: 0.25 mg via INTRAVENOUS
  Filled 2023-03-02: qty 5

## 2023-03-02 MED ORDER — ACETAMINOPHEN 325 MG PO TABS
650.0000 mg | ORAL_TABLET | Freq: Once | ORAL | Status: AC
  Administered 2023-03-02: 650 mg via ORAL
  Filled 2023-03-02: qty 2

## 2023-03-02 MED ORDER — CETIRIZINE HCL 10 MG/ML IV SOLN
10.0000 mg | Freq: Once | INTRAVENOUS | Status: AC
  Administered 2023-03-02: 10 mg via INTRAVENOUS
  Filled 2023-03-02: qty 1

## 2023-03-02 MED ORDER — SODIUM CHLORIDE 0.9 % IV SOLN
70.0000 mg/m2 | Freq: Once | INTRAVENOUS | Status: AC
  Administered 2023-03-02: 152.5 mg via INTRAVENOUS
  Filled 2023-03-02: qty 6.1

## 2023-03-02 NOTE — Progress Notes (Signed)
10:51 am Dr. Delton Coombes at the bedside to assess patient. Patient's blood pressure 71/47. Orders received to give a 500 ml bolus of normal saline now. Recheck blood pressure after fluid bolus. If systolic pressure is less than 95 give another 500 ml fluid bolus of normal saline. Restart Rituximab once blood press systolic over 95.   [id number] am. Restarted Rituximab at prior rate. 20.6 mls / hour. Patient has no complaints at this time.

## 2023-03-02 NOTE — Patient Instructions (Signed)
[facility] at [facility] Discharge Instructions   You were seen and examined today by Dr. Katragadda.  He reviewed the results of your lab work which are normal/stable.   We will proceed with your treatment today.  Return as scheduled.    Thank you for choosing [facility] at [facility] to provide your oncology and hematology care.  To afford each patient quality time with our provider, please arrive at least 15 minutes before your scheduled appointment time.   If you have a lab appointment with the Cancer Center please come in thru the Main Entrance and check in at the main information desk.  You need to re-schedule your appointment should you arrive 10 or more minutes late.  We strive to give you quality time with our providers, and arriving late affects you and other patients whose appointments are after yours.  Also, if you no show three or more times for appointments you may be dismissed from the clinic at the providers discretion.     Again, thank you for choosing [facility].  Our hope is that these requests will decrease the amount of time that you wait before being seen by our physicians.       _____________________________________________________________  Should you have questions after your visit to [facility], please contact our office at [phone] and follow the prompts.  Our office hours are 8:00 a.m. and 4:30 p.m. Monday - Friday.  Please note that voicemails left after 4:00 p.m. may not be returned until the following business day.  We are closed weekends and major holidays.  You do have access to a nurse 24-7, just call the main number to the clinic [phone] and do not press any options, hold on the line and a nurse will answer the phone.    For prescription refill requests, have your pharmacy contact our office and allow 72 hours.    Due to Covid, you will need to wear a mask upon entering  the hospital. If you do not have a mask, a mask will be given to you at the Main Entrance upon arrival. For doctor visits, patients may have 1 support person age 18 or older with them. For treatment visits, patients can not have anyone with them due to social distancing guidelines and our immunocompromised population.      

## 2023-03-02 NOTE — Progress Notes (Signed)
Patient presents today for C1D1 Rituximab/Bendeka per provider's order. Vital signs and other labs are within parameters.  Platelets are 71 today, Dr.K aware and stated pt is okay to proceed with treatment today.  Peripheral IV started with good blood return pre and post infusion.  C1D1 Rituximab and Bendeka given today per MD orders. Tolerated infusion without adverse affects. Vital signs stable. No complaints at this time. Discharged from clinic ambulatory in stable condition. Alert and oriented x 3. F/U with [facility] as scheduled.

## 2023-03-02 NOTE — Patient Instructions (Signed)
[facility]  Discharge Instructions: Thank you for choosing [facility] to provide your oncology and hematology care.  If you have a lab appointment with the [facility], please come in thru the Main Entrance and check in at the main information desk.  Wear comfortable clothing and clothing appropriate for easy access to any Portacath or PICC line.   We strive to give you quality time with your provider. You may need to reschedule your appointment if you arrive late (15 or more minutes).  Arriving late affects you and other patients whose appointments are after yours.  Also, if you miss three or more appointments without notifying the office, you may be dismissed from the clinic at the provider's discretion.      For prescription refill requests, have your pharmacy contact our office and allow 72 hours for refills to be completed.    Today you received the following chemotherapy and/or immunotherapy agents Rituximab and Bendeka   To help prevent nausea and vomiting after your treatment, we encourage you to take your nausea medication as directed.  Rituximab Injection What is this medication? RITUXIMAB (ri TUX i mab) treats leukemia and lymphoma. It works by blocking a protein that causes cancer cells to grow and multiply. This helps to slow or stop the spread of cancer cells. It may also be used to treat autoimmune conditions, such as arthritis. It works by slowing down an overactive immune system. It is a monoclonal antibody. This medicine may be used for other purposes; ask your health care provider or pharmacist if you have questions. COMMON BRAND NAME(S): RIABNI, Rituxan, RUXIENCE, truxima What should I tell my care team before I take this medication? They need to know if you have any of these conditions: Chest pain Heart disease Immune system problems Infection, such as chickenpox, cold sores, hepatitis B, herpes Irregular heartbeat or  rhythm Kidney disease Low blood counts, such as low white cells, platelets, red cells Lung disease Recent or upcoming vaccine An unusual or allergic reaction to rituximab, other medications, foods, dyes, or preservatives Pregnant or trying to get pregnant Breast-feeding How should I use this medication? This medication is injected into a vein. It is given by a care team in a hospital or clinic setting. A special MedGuide will be given to you before each treatment. Be sure to read this information carefully each time. Talk to your care team about the use of this medication in children. While this medication may be prescribed for children as young as 6 months for selected conditions, precautions do apply. Overdosage: If you think you have taken too much of this medicine contact a poison control center or emergency room at once. NOTE: This medicine is only for you. Do not share this medicine with others. What if I miss a dose? Keep appointments for follow-up doses. It is important not to miss your dose. Call your care team if you are unable to keep an appointment. What may interact with this medication? Do not take this medication with any of the following: Live vaccines This medication may also interact with the following: Cisplatin This list may not describe all possible interactions. Give your health care provider a list of all the medicines, herbs, non-prescription drugs, or dietary supplements you use. Also tell them if you smoke, drink alcohol, or use illegal drugs. Some items may interact with your medicine. What should I watch for while using this medication? Your condition will be monitored carefully while you  are receiving this medication. You may need blood work while taking this medication. This medication can cause serious infusion reactions. To reduce the risk your care team may give you other medications to take before receiving this one. Be sure to follow the directions from  your care team. This medication may increase your risk of getting an infection. Call your care team for advice if you get a fever, chills, sore throat, or other symptoms of a cold or flu. Do not treat yourself. Try to avoid being around people who are sick. Call your care team if you are around anyone with measles, chickenpox, or if you develop sores or blisters that do not heal properly. Avoid taking medications that contain aspirin, acetaminophen, ibuprofen, naproxen, or ketoprofen unless instructed by your care team. These medications may hide a fever. This medication may cause serious skin reactions. They can happen weeks to months after starting the medication. Contact your care team right away if you notice fevers or flu-like symptoms with a rash. The rash may be red or purple and then turn into blisters or peeling of the skin. You may also notice a red rash with swelling of the face, lips, or lymph nodes in your neck or under your arms. In some patients, this medication may cause a serious brain infection that may cause death. If you have any problems seeing, thinking, speaking, walking, or standing, tell your care team right away. If you cannot reach your care team, urgently seek another source of medical care. Talk to your care team if you may be pregnant. Serious birth defects can occur if you take this medication during pregnancy and for 12 months after the last dose. You will need a negative pregnancy test before starting this medication. Contraception is recommended while taking this medication and for 12 months after the last dose. Your care team can help you find the option that works for you. Do not breastfeed while taking this medication and for at least 6 months after the last dose. What side effects may I notice from receiving this medication? Side effects that you should report to your care team as soon as possible: Allergic reactions or angioedema--skin rash, itching or hives, swelling  of the face, eyes, lips, tongue, arms, or legs, trouble swallowing or breathing Bowel blockage--stomach cramping, unable to have a bowel movement or pass gas, loss of appetite, vomiting Dizziness, loss of balance or coordination, confusion or trouble speaking Heart attack--pain or tightness in the chest, shoulders, arms, or jaw, nausea, shortness of breath, cold or clammy skin, feeling faint or lightheaded Heart rhythm changes--fast or irregular heartbeat, dizziness, feeling faint or lightheaded, chest pain, trouble breathing Infection--fever, chills, cough, sore throat, wounds that don't heal, pain or trouble when passing urine, general feeling of discomfort or being unwell Infusion reactions--chest pain, shortness of breath or trouble breathing, feeling faint or lightheaded Kidney injury--decrease in the amount of urine, swelling of the ankles, hands, or feet Liver injury--right upper belly pain, loss of appetite, nausea, light-colored stool, dark yellow or brown urine, yellowing skin or eyes, unusual weakness or fatigue Redness, blistering, peeling, or loosening of the skin, including inside the mouth Stomach pain that is severe, does not go away, or gets worse Tumor lysis syndrome (TLS)--nausea, vomiting, diarrhea, decrease in the amount of urine, dark urine, unusual weakness or fatigue, confusion, muscle pain or cramps, fast or irregular heartbeat, joint pain Side effects that usually do not require medical attention (report to your care team if they continue  or are bothersome): Headache Joint pain Nausea Runny or stuffy nose Unusual weakness or fatigue This list may not describe all possible side effects. Call your doctor for medical advice about side effects. You may report side effects to FDA at 1-800-FDA-1088. Where should I keep my medication? This medication is given in a hospital or clinic. It will not be stored at home. NOTE: This sheet is a summary. It may not cover all possible  information. If you have questions about this medicine, talk to your doctor, pharmacist, or health care provider.  2023 Elsevier/Gold Standard (2022-04-01 00:00:00)   Bendamustine Injection What is this medication? BENDAMUSTINE (BEN da MUS teen) treats leukemia and lymphoma. It works by slowing down the growth of cancer cells. This medicine may be used for other purposes; ask your health care provider or pharmacist if you have questions. COMMON BRAND NAME(S): Oren Beckmann, VIVIMUSTA What should I tell my care team before I take this medication? They need to know if you have any of these conditions: Infection, especially a viral infection, such as chickenpox, cold sores, herpes Kidney disease Liver disease An unusual or allergic reaction to bendamustine, mannitol, other medications, foods, dyes, or preservatives Pregnant or trying to get pregnant Breast-feeding How should I use this medication? This medication is injected into a vein. It is given by your care team in a hospital or clinic setting. Talk to your care team about the use of this medication in children. Special care may be needed. Overdosage: If you think you have taken too much of this medicine contact a poison control center or emergency room at once. NOTE: This medicine is only for you. Do not share this medicine with others. What if I miss a dose? Keep appointments for follow-up doses. It is important not to miss your dose. Call your care team if you are unable to keep an appointment. What may interact with this medication? Do not take this medication with any of the following: Clozapine This medication may also interact with the following: Atazanavir Cimetidine Ciprofloxacin Enoxacin Fluvoxamine Medications for seizures, such as carbamazepine, phenobarbital Mexiletine Rifampin Tacrine Thiabendazole Zileuton This list may not describe all possible interactions. Give your health care provider a list  of all the medicines, herbs, non-prescription drugs, or dietary supplements you use. Also tell them if you smoke, drink alcohol, or use illegal drugs. Some items may interact with your medicine. What should I watch for while using this medication? Visit your care team for regular checks on your progress. This medication may make you feel generally unwell. This is not uncommon, as chemotherapy can affect healthy cells as well as cancer cells. Report any side effects. Continue your course of treatment even though you feel ill unless your care team tells you to stop. You may need blood work while taking this medication. This medication may increase your risk of getting an infection. Call your care team for advice if you get a fever, chills, sore throat, or other symptoms of a cold or flu. Do not treat yourself. Try to avoid being around people who are sick. This medication may cause serious skin reactions. They can happen weeks to months after starting the medication. Contact your care team right away if you notice fevers or flu-like symptoms with a rash. The rash may be red or purple and then turn into blisters or peeling of the skin. You may also notice a red rash with swelling of the face, lips, or lymph nodes in your neck or  under your arms. In some patients, this medication may cause a serious brain infection that may cause death. If you have any problems seeing, thinking, speaking, walking, or standing, tell your care team right away. If you cannot reach your care team, urgently seek other source of medical care. This medication may increase your risk to bruise or bleed. Call your care team if you notice any unusual bleeding. Talk to your care team about your risk of cancer. You may be more at risk for certain types of cancer if you take this medication. Talk to your care team about your risk of skin cancer. You may be more at risk for skin cancer if you take this medication. Talk to your care team if  you or your partner wish to become pregnant or think either of you might be pregnant. This medication can cause serious birth defects if taken during pregnancy or for up to 6 months after the last dose. A negative pregnancy test is required before starting this medication. A reliable form of contraception is recommended while taking this medication and for 6 months after the last dose. Talk to your care team about reliable forms of contraception. Wear a condom while taking this medication and for at least 3 months after the last dose. Do not breast-feed while taking this medication or for at least 1 week after the last dose. This medication may cause infertility. Talk to your care team if you are concerned about your fertility. What side effects may I notice from receiving this medication? Side effects that you should report to your care team as soon as possible: Allergic reactions--skin rash, itching, hives, swelling of the face, lips, tongue, or throat Infection--fever, chills, cough, sore throat, wounds that don't heal, pain or trouble when passing urine, general feeling of discomfort or being unwell Infusion reactions--chest pain, shortness of breath or trouble breathing, feeling faint or lightheaded Liver injury--right upper belly pain, loss of appetite, nausea, light-colored stool, dark yellow or brown urine, yellowing skin or eyes, unusual weakness or fatigue Low red blood cell level--unusual weakness or fatigue, dizziness, headache, trouble breathing Painful swelling, warmth, or redness of the skin, blisters or sores at the infusion site Rash, fever, and swollen lymph nodes Redness, blistering, peeling, or loosening of the skin, including inside the mouth Tumor lysis syndrome (TLS)--nausea, vomiting, diarrhea, decrease in the amount of urine, dark urine, unusual weakness or fatigue, confusion, muscle pain or cramps, fast or irregular heartbeat, joint pain Unusual bruising or bleeding Side  effects that usually do not require medical attention (report to your care team if they continue or are bothersome): Diarrhea Fatigue Headache Loss of appetite Nausea Vomiting This list may not describe all possible side effects. Call your doctor for medical advice about side effects. You may report side effects to FDA at 1-800-FDA-1088. Where should I keep my medication? This medication is given in a hospital or clinic. It will not be stored at home. NOTE: This sheet is a summary. It may not cover all possible information. If you have questions about this medicine, talk to your doctor, pharmacist, or health care provider.  2023 Elsevier/Gold Standard (2022-02-24 00:00:00)   BELOW ARE SYMPTOMS THAT SHOULD BE REPORTED IMMEDIATELY: *FEVER GREATER THAN 100.4 F (38 C) OR HIGHER *CHILLS OR SWEATING *NAUSEA AND VOMITING THAT IS NOT CONTROLLED WITH YOUR NAUSEA MEDICATION *UNUSUAL SHORTNESS OF BREATH *UNUSUAL BRUISING OR BLEEDING *URINARY PROBLEMS (pain or burning when urinating, or frequent urination) *BOWEL PROBLEMS (unusual diarrhea, constipation, pain near the  anus) TENDERNESS IN MOUTH AND THROAT WITH OR WITHOUT PRESENCE OF ULCERS (sore throat, sores in mouth, or a toothache) UNUSUAL RASH, SWELLING OR PAIN  UNUSUAL VAGINAL DISCHARGE OR ITCHING   Items with * indicate a potential emergency and should be followed up as soon as possible or go to the Emergency Department if any problems should occur.  Please show the CHEMOTHERAPY ALERT CARD or IMMUNOTHERAPY ALERT CARD at check-in to the Emergency Department and triage nurse.  Should you have questions after your visit or need to cancel or reschedule your appointment, please contact [facility] [phone]  and follow the prompts.  Office hours are 8:00 a.m. to 4:30 p.m. Monday - Friday. Please note that voicemails left after 4:00 p.m. may not be returned until the following business day.  We are closed weekends and  major holidays. You have access to a nurse at all times for urgent questions. Please call the main number to the clinic [phone] and follow the prompts.  For any non-urgent questions, you may also contact your provider using MyChart. We now offer e-Visits for anyone 46 and older to request care online for non-urgent symptoms. For details visit [email or web address].   Also download the MyChart app! Go to the app store, search "MyChart", open the app, select [facility], and log in with your MyChart username and password.

## 2023-03-02 NOTE — Progress Notes (Signed)
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 He is receiving rituximab + bendamustine q28d (start 4/1). Patient is under the care of Dr. Delton Coombes.   Past medical history includes HLD, osteoarthritis, right lower lobe lung mass   Met with patient and girlfriend in infusion. Patient resting with cold cloth on forehead. Noted episode of hypotension prior to visit. Patient received 500 ml bolus of saline with improvement blood pressure. Treatment restarted. He reports feeling better and agreeable to visit. Patient endorses good appetite. Recalls 3 meals plus snacks. Patient drinks one [employer] or [employer] for added protein (~150 kcal, 30 g). He was consuming these prior to diagnosis. Patient does not like water. He drinks sweet tea and soda. Girlfriend says he loves Coke. Patient denies nutrition impact symptoms at this time.   Nutrition Focused Physical Exam: deferred    Medications: lipitor, zyloprim, ferrous sulfate, MVI, zofran, compazine, drisdol   Labs: reviewed    Anthropometrics:   Height: 6'3" Weight: 201 lb  UBW: 200 lb  BMI: 25.12    NUTRITION DIAGNOSIS: Food and nutrition related knowledge deficit related to newly diagnosed NHL as evidenced by no prior need for associated nutrition information    INTERVENTION:  Educated on importance of adequate calorie and protein energy intake to maintain strength/weights during treatment Discussed foods with protein, encouraged protein source with all meals/snacks - handout with ideas provided Educated on importance of hydration, encouraged limiting intake soda/sweet tea and increasing intake of water. Recommend 8-10 glasses daily. Offered ideas on ways to alter water flavor - samples of Pedialyte hydration powder given for pt to try    MONITORING, EVALUATION, GOAL: Patient will tolerate adequate calories and protein to  minimize weight loss during treatment   Next Visit: To be scheduled as needed

## 2023-03-02 NOTE — Progress Notes (Signed)
Patient has been examined by Dr. Delton Coombes. Vital signs and labs have been reviewed by MD - ANC, Creatinine, LFTs, hemoglobin, and platelets (71,000) are within treatment parameters per M.D. - pt may proceed with treatment.  Elitek 6 mg to be administered per MD. Primary RN and pharmacy notified.

## 2023-03-03 ENCOUNTER — Other Ambulatory Visit: Payer: Self-pay

## 2023-03-03 ENCOUNTER — Other Ambulatory Visit: Payer: Self-pay | Admitting: Student

## 2023-03-03 ENCOUNTER — Inpatient Hospital Stay: Payer: Medicare Other

## 2023-03-03 ENCOUNTER — Other Ambulatory Visit: Payer: Self-pay | Admitting: Radiology

## 2023-03-03 VITALS — BP 108/58 | HR 51 | Temp 97.7°F | Resp 18

## 2023-03-03 DIAGNOSIS — Z5112 Encounter for antineoplastic immunotherapy: Secondary | ICD-10-CM | POA: Diagnosis not present

## 2023-03-03 DIAGNOSIS — C8318 Mantle cell lymphoma, lymph nodes of multiple sites: Secondary | ICD-10-CM

## 2023-03-03 MED ORDER — SODIUM CHLORIDE 0.9 % IV SOLN: Freq: Once | INTRAVENOUS | Status: AC

## 2023-03-03 MED ORDER — HEPARIN SOD (PORK) LOCK FLUSH 100 UNIT/ML IV SOLN: 500.0000 [IU] | Freq: Once | INTRAVENOUS | Status: DC | PRN

## 2023-03-03 MED ORDER — PEGFILGRASTIM 6 MG/0.6ML ~~LOC~~ PSKT
6.0000 mg | PREFILLED_SYRINGE | Freq: Once | SUBCUTANEOUS | Status: DC
  Filled 2023-03-03: qty 0.6

## 2023-03-03 MED ORDER — SODIUM CHLORIDE 0.9 % IV SOLN
70.0000 mg/m2 | Freq: Once | INTRAVENOUS | Status: AC
  Administered 2023-03-03: 152.5 mg via INTRAVENOUS
  Filled 2023-03-03: qty 6.1

## 2023-03-03 MED ORDER — SODIUM CHLORIDE 0.9 % IV SOLN
10.0000 mg | Freq: Once | INTRAVENOUS | Status: AC
  Administered 2023-03-03: 10 mg via INTRAVENOUS
  Filled 2023-03-03: qty 10

## 2023-03-03 MED ORDER — SODIUM CHLORIDE 0.9% FLUSH: 10.0000 mL | INTRAVENOUS | Status: DC | PRN

## 2023-03-03 NOTE — Patient Instructions (Signed)
[facility]  Discharge Instructions: Thank you for choosing [facility] to provide your oncology and hematology care.  If you have a lab appointment with the Cancer Center, please come in thru the Main Entrance and check in at the main information desk.  Wear comfortable clothing and clothing appropriate for easy access to any Portacath or PICC line.   We strive to give you quality time with your provider. You may need to reschedule your appointment if you arrive late (15 or more minutes).  Arriving late affects you and other patients whose appointments are after yours.  Also, if you miss three or more appointments without notifying the office, you may be dismissed from the clinic at the provider's discretion.      For prescription refill requests, have your pharmacy contact our office and allow 72 hours for refills to be completed.    Today you received the following chemotherapy and/or immunotherapy agents Bendeka      To help prevent nausea and vomiting after your treatment, we encourage you to take your nausea medication as directed.  BELOW ARE SYMPTOMS THAT SHOULD BE REPORTED IMMEDIATELY: *FEVER GREATER THAN 100.4 F (38 C) OR HIGHER *CHILLS OR SWEATING *NAUSEA AND VOMITING THAT IS NOT CONTROLLED WITH YOUR NAUSEA MEDICATION *UNUSUAL SHORTNESS OF BREATH *UNUSUAL BRUISING OR BLEEDING *URINARY PROBLEMS (pain or burning when urinating, or frequent urination) *BOWEL PROBLEMS (unusual diarrhea, constipation, pain near the anus) TENDERNESS IN MOUTH AND THROAT WITH OR WITHOUT PRESENCE OF ULCERS (sore throat, sores in mouth, or a toothache) UNUSUAL RASH, SWELLING OR PAIN  UNUSUAL VAGINAL DISCHARGE OR ITCHING   Items with * indicate a potential emergency and should be followed up as soon as possible or go to the Emergency Department if any problems should occur.  Please show the CHEMOTHERAPY ALERT CARD or IMMUNOTHERAPY ALERT CARD at check-in to the Emergency  Department and triage nurse.  Should you have questions after your visit or need to cancel or reschedule your appointment, please contact [facility] [phone]  and follow the prompts.  Office hours are 8:00 a.m. to 4:30 p.m. Monday - Friday. Please note that voicemails left after 4:00 p.m. may not be returned until the following business day.  We are closed weekends and major holidays. You have access to a nurse at all times for urgent questions. Please call the main number to the clinic [phone] and follow the prompts.  For any non-urgent questions, you may also contact your provider using MyChart. We now offer e-Visits for anyone 18 and older to request care online for non-urgent symptoms. For details visit [email or web address].   Also download the MyChart app! Go to the app store, search "MyChart", open the app, select [facility], and log in with your MyChart username and password.   

## 2023-03-03 NOTE — Progress Notes (Signed)
Patient presents today for Bendeka and [employer].  Vital signs WNL.  Patient has no new complaints at this time.  Treatment given today per MD orders.  Stable during infusion without adverse affects.  Vital signs stable.  Unable to place Onpro at this time due to scheduled PORT placement with interventional radiology 03/04/23. Patient will return Thursday for Neulasta injection. No complaints at this time.  Discharge from clinic ambulatory in stable condition.  Alert and oriented X 3.  Follow up with [facility] as scheduled.

## 2023-03-04 ENCOUNTER — Encounter (HOSPITAL_COMMUNITY): Payer: Self-pay

## 2023-03-04 ENCOUNTER — Other Ambulatory Visit: Payer: Self-pay

## 2023-03-04 ENCOUNTER — Other Ambulatory Visit: Payer: Self-pay | Admitting: Internal Medicine

## 2023-03-04 ENCOUNTER — Ambulatory Visit (HOSPITAL_COMMUNITY)
Admission: RE | Admit: 2023-03-04 | Discharge: 2023-03-04 | Disposition: A | Discharge status: Home / Self Care | Payer: Medicare Other | Source: Ambulatory Visit | Attending: Hematology | Admitting: Hematology

## 2023-03-04 DIAGNOSIS — D649 Anemia, unspecified: Secondary | ICD-10-CM | POA: Diagnosis not present

## 2023-03-04 DIAGNOSIS — C8318 Mantle cell lymphoma, lymph nodes of multiple sites: Secondary | ICD-10-CM | POA: Diagnosis not present

## 2023-03-04 DIAGNOSIS — E785 Hyperlipidemia, unspecified: Secondary | ICD-10-CM | POA: Insufficient documentation

## 2023-03-04 HISTORY — PX: IR IMAGING GUIDED PORT INSERTION: IMG5740

## 2023-03-04 MED ORDER — MIDAZOLAM HCL 2 MG/2ML IJ SOLN
INTRAMUSCULAR | Status: AC
  Filled 2023-03-04: qty 2

## 2023-03-04 MED ORDER — FENTANYL CITRATE (PF) 100 MCG/2ML IJ SOLN
INTRAMUSCULAR | Status: AC | PRN
  Administered 2023-03-04 (×2): 25 ug via INTRAVENOUS

## 2023-03-04 MED ORDER — FENTANYL CITRATE (PF) 100 MCG/2ML IJ SOLN
INTRAMUSCULAR | Status: AC
  Filled 2023-03-04: qty 2

## 2023-03-04 MED ORDER — MIDAZOLAM HCL 2 MG/2ML IJ SOLN
INTRAMUSCULAR | Status: AC | PRN
  Administered 2023-03-04: 1 mg via INTRAVENOUS

## 2023-03-04 MED ORDER — LIDOCAINE-EPINEPHRINE 1 %-1:100000 IJ SOLN
INTRAMUSCULAR | Status: AC
  Filled 2023-03-04: qty 1

## 2023-03-04 MED ORDER — HEPARIN SOD (PORK) LOCK FLUSH 100 UNIT/ML IV SOLN
INTRAVENOUS | Status: AC
  Filled 2023-03-04: qty 5

## 2023-03-04 NOTE — Progress Notes (Signed)
24 hour call back.  Unable to complete at this time.  Patient currently have PORT placed in interventional radiology.

## 2023-03-04 NOTE — H&P (Signed)
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 male with past medical history of CLL, HLD, osteoarthritis, anemia who recently underwent bronchal biopsy of a right hilar mass positive for mantle cell lymphoma. He has plans for upcoming chemotherapy and is in need of durable venous access.   IR consulted for Port-A-Cath placement per the request of Dr. Delton Coombes.   Patient presents for procedure today in his usual state of health.  He has been NPO.  His girlfriend is at bedside.  She is available for transportation and post-procedure care today.  He is aware of the goals of the procedure and is agreeable to proceed.   Past Medical History:  Diagnosis Date   Anemia    Cataract    CLL (chronic lymphocytic leukemia)    Erectile dysfunction    History of bronchitis    History of colon polyps    Hyperlipemia    Osteoarthritis    Shortness of breath dyspnea    with exertion    Past Surgical History:  Procedure Laterality Date   APPENDECTOMY     BRONCHIAL NEEDLE ASPIRATION BIOPSY  01/28/2023   Procedure: BRONCHIAL NEEDLE ASPIRATION BIOPSIES;  Surgeon: Garner Nash, DO;  Location: [facility];  Service: Cardiopulmonary;;   CATARACT EXTRACTION W/PHACO Left 10/15/2015   Procedure: CATARACT EXTRACTION PHACO AND INTRAOCULAR LENS PLACEMENT LEFT EYE;  Surgeon: Tonny Branch, MD;  Location: [facility];  Service: Ophthalmology;  Laterality: Left;  CDE:12.20   CATARACT EXTRACTION W/PHACO Right 11/12/2015   Procedure: CATARACT EXTRACTION PHACO AND INTRAOCULAR LENS PLACEMENT RIGHT EYE:  CDE:  8.99;  Surgeon: Tonny Branch, MD;  Location: [facility];  Service: Ophthalmology;  Laterality: Right;   KNEE SURGERY     Left x 2    MASS EXCISION N/A 09/12/2015   Procedure: EXCISION SOFT TISSUE NEOPLASM (6  CM), BACK;  Surgeon: Aviva Signs, MD;  Location: [facility];  Service: General;  Laterality: N/A;   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   Procedure: TOTAL KNEE ARTHROPLASTY;  Surgeon: Paralee Cancel, MD;  Location: [facility];  Service: Orthopedics;  Laterality: Right;  70 mins   VIDEO BRONCHOSCOPY WITH ENDOBRONCHIAL ULTRASOUND Bilateral 01/28/2023   Procedure: VIDEO BRONCHOSCOPY WITH ENDOBRONCHIAL ULTRASOUND;  Surgeon: Garner Nash, DO;  Location: [facility];  Service: Cardiopulmonary;  Laterality: Bilateral;    Allergies: Patient has no known allergies.  Medications: Prior to Admission medications   Medication Sig Start Date End Date Taking? Authorizing Provider  allopurinol (ZYLOPRIM) 300 MG tablet Take 1 tablet (300 mg total) by mouth daily. 02/24/23  Yes Derek Jack, MD  atorvastatin (LIPITOR) 40 MG tablet Take 1 tablet (40 mg total) by mouth daily. 11/12/22  Yes Stacks, Cletus Gash, MD  BENDAMUSTINE HCL IV Inject into the vein every 28 (twenty-eight) days. Days 1 and 2 every 28 days 03/02/23  Yes [provider]  ferrous sulfate 325 (65 FE) MG tablet Take 325 mg by mouth daily with breakfast.   Yes [provider]  Multiple Vitamin (MULTIVITAMIN WITH MINERALS) TABS tablet Take 1 tablet by mouth daily.   Yes [provider]  OLIVE LEAF EXTRACT PO Take 1 tablet by mouth in the morning and at bedtime.   Yes [provider]  OVER THE COUNTER MEDICATION Take 1 each by mouth 5 (five) times daily. [name]  Yes [provider]  riTUXimab (RITUXAN IV) Inject into the vein every 28 (twenty-eight) days. 03/02/23  Yes [provider]  Vitamin D, Ergocalciferol, (DRISDOL) 1.25 MG (50000 UNIT) CAPS capsule Take 1 capsule (50,000 Units total) by mouth every 7 (seven) days. 11/12/22 11/11/23 Yes Stacks, Cletus Gash, MD  lidocaine-prilocaine (EMLA) cream Apply to affected area once 02/24/23   Derek Jack, MD  ondansetron (ZOFRAN) 4 MG tablet  Take 1 tablet (4 mg total) by mouth every 8 (eight) hours as needed for nausea or vomiting. 01/15/22   Ivy Lynn, NP  prochlorperazine (COMPAZINE) 10 MG tablet Take 1 tablet (10 mg total) by mouth every 6 (six) hours as needed for nausea or vomiting. 02/24/23   Derek Jack, MD  Vitamin D, Ergocalciferol, (DRISDOL) 1.25 MG (50000 UNIT) CAPS capsule Take 1 capsule (50,000 Units total) by mouth every 7 (seven) days. 11/17/22   Claretta Fraise, MD     Family History  Problem Relation Age of Onset   Colon cancer Father        age 42    Colon polyps Father    Heart disease Father        Heart failure   Heart disease Mother    Cancer Sister        Uterine, and ovarian   Hypertension Brother    Obesity Sister    Heart disease Maternal Grandmother    Esophageal cancer Neg Hx    Liver cancer Neg Hx    Pancreatic cancer Neg Hx    Rectal cancer Neg Hx    Stomach cancer Neg Hx     Social History   Socioeconomic History   Marital status: Soil scientist    Spouse name: Not on file   Number of children: 2   Years of education: 12 years   Highest education level: 12th grade  Occupational History   Occupation: RETIRED  Tobacco Use   Smoking status: Former    Packs/day: 1.00    Years: 10.00    Additional pack years: 0.00    Total pack years: 10.00    Types: Cigarettes, Pipe    Quit date: 09/10/1991    Years since quitting: 31.5   Smokeless tobacco: Never   Tobacco comments:    Smoked a pipe occasionally for 3 years.  Vaping Use   Vaping Use: Never used  Substance and Sexual Activity   Alcohol use: Not Currently    Alcohol/week: 1.0 standard drink of alcohol    Types: 1 Cans of beer per week   Drug use: Yes    Frequency: 1.0 times per week    Types: Marijuana   Sexual activity: Yes  Other Topics Concern   Not on file  Social History Narrative   2 sons who live close by   Lives with girlfriend   Social Determinants of Health   Financial Resource Strain: Low  Risk  (11/06/2022)   Overall Financial Resource Strain (CARDIA)    Difficulty of Paying Living Expenses: Not hard at all  Food Insecurity: No Food Insecurity (12/29/2022)   Hunger Vital Sign    Worried About Running Out of Food in the Last Year: Never true    Ran Out of Food in the Last Year: Never true  Transportation Needs: No Transportation Needs (11/06/2022)   PRAPARE - Hydrologist (Medical): No    Lack of Transportation (Non-Medical): No  Physical Activity: Sufficiently Active (11/06/2022)   Exercise Vital Sign  Days of Exercise per Week: 7 days    Minutes of Exercise per Session: 30 min  Stress: No Stress Concern Present (11/06/2022)   [place]    Feeling of Stress : Not at all  Social Connections: [place] (11/06/2022)   Social Connection and Isolation Panel [NHANES]    Frequency of Communication with Friends and Family: More than three times a week    Frequency of Social Gatherings with Friends and Family: More than three times a week    Attends Religious Services: 1 to 4 times per year    Active Member of [employer] or Organizations: Yes    Attends Music therapist: More than 4 times per year    Marital Status: Living with partner     Review of Systems: A 12 point ROS discussed and pertinent positives are indicated in the HPI above.  All other systems are negative.  Review of Systems  Constitutional:  Negative for fatigue and fever.  Respiratory:  Negative for cough and shortness of breath.   Cardiovascular:  Negative for chest pain.  Gastrointestinal:  Negative for abdominal pain and nausea.  Musculoskeletal:  Negative for back pain.  Psychiatric/Behavioral:  Negative for behavioral problems and confusion.     Vital Signs: BP 127/70   Pulse (!) 49   Temp 97.9 F (36.6 C) (Oral)   Resp 17   Ht 6\' 3"  (1.905 m)   Wt 200 lb (90.7 kg)   SpO2 98%   BMI  25.00 kg/m   Physical Exam Vitals and nursing note reviewed.  Constitutional:      General: He is not in acute distress.    Appearance: Normal appearance. He is not ill-appearing.  HENT:     Mouth/Throat:     Mouth: Mucous membranes are moist.     Pharynx: Oropharynx is clear.  Cardiovascular:     Rate and Rhythm: Normal rate and regular rhythm.  Pulmonary:     Effort: Pulmonary effort is normal. No respiratory distress.     Breath sounds: Normal breath sounds.  Abdominal:     General: Abdomen is flat.     Palpations: Abdomen is soft.  Skin:    General: Skin is warm and dry.  Neurological:     General: No focal deficit present.     Mental Status: He is alert and oriented to person, place, and time. Mental status is at baseline.  Psychiatric:        Mood and Affect: Mood normal.        Behavior: Behavior normal.        Thought Content: Thought content normal.        Judgment: Judgment normal.      MD Evaluation Airway: WNL Heart: WNL Abdomen: WNL Chest/ Lungs: WNL ASA  Classification: 3 Mallampati/Airway Score: Two   Imaging: No results found.  Labs:  CBC: Recent Labs    11/12/22 1034 12/29/22 1404 01/28/23 0620 02/26/23 1249  WBC CANCELED 54.0* 52.7* 43.2*  HGB CANCELED 11.6* 11.5* 11.1*  HCT CANCELED 36.7* 35.5* 35.4*  PLT CANCELED 79* 77* 71*    COAGS: No results for input(s): "INR", "APTT" in the last 8760 hours.  BMP: Recent Labs    11/12/22 1034 12/29/22 1404 01/28/23 0620 02/26/23 1249  NA 143 141 140 138  K 4.4 4.4 5.0 4.6  CL 106 107 105 105  CO2 24 26 25 28   GLUCOSE 93 94 101* 125*  BUN  16 23 19 20   CALCIUM 9.1 8.6* 8.9 8.7*  CREATININE 0.81 0.87 0.79 0.80  GFRNONAA  --  >60 >60 >60    LIVER FUNCTION TESTS: Recent Labs    03/17/22 1421 11/12/22 1034 12/29/22 1404 02/26/23 1249  BILITOT 0.5 0.5 0.8 0.7  AST 20 19 22 17   ALT 17 16 16 16   ALKPHOS 98 90 80 77  PROT 5.9* 6.0 6.4* 5.9*  ALBUMIN 4.5 4.5 4.4 3.9     TUMOR MARKERS: No results for input(s): "AFPTM", "CEA", "CA199", "CHROMGRNA" in the last 8760 hours.  Assessment and Plan: Patient with past medical history of CLL presents with complaint of recent findings of mantle cell lymphoma. He has plans to initiate chemotherapy.  IR consulted for Port-A-Cath placement at the request of Dr. Delton Coombes. Case reviewed by Dr. Earleen Newport who approves patient for procedure.  Patient presents today in their usual state of health.  He has been NPO and is not currently on blood thinners.   Risks and benefits of image guided port-a-catheter placement was discussed with the patient including, but not limited to bleeding, infection, pneumothorax, or fibrin sheath development and need for additional procedures.  All of the patient's questions were answered, patient is agreeable to proceed. Consent signed and in chart.  Thank you for this interesting consult.  I greatly enjoyed meeting DASON BURGERT and look forward to participating in their care.  A copy of this report was sent to the requesting provider on this date.  Electronically Signed: Docia Barrier, PA 03/04/2023, 11:04 AM   I spent a total of  30 Minutes   in face to face in clinical consultation, greater than 50% of which was counseling/coordinating care for mantle cell lymphoma.

## 2023-03-04 NOTE — Procedures (Signed)
Interventional Radiology Procedure Note  Procedure: Placement of a right IJ approach single lumen PowerPort.  Tip is positioned at the superior cavoatrial junction and catheter is ready for immediate use.  Complications: None Recommendations:  - Ok to shower tomorrow - Do not submerge for 7 days - Routine line care   Signed,  Joniya Boberg S. Gohan Collister, DO   

## 2023-03-05 ENCOUNTER — Inpatient Hospital Stay: Payer: Medicare Other

## 2023-03-05 ENCOUNTER — Ambulatory Visit: Payer: Medicare Other

## 2023-03-05 ENCOUNTER — Inpatient Hospital Stay: Payer: Medicare Other | Admitting: Licensed Clinical Social Worker

## 2023-03-05 ENCOUNTER — Other Ambulatory Visit: Payer: Self-pay

## 2023-03-05 VITALS — BP 120/64 | HR 63 | Temp 98.0°F | Resp 18

## 2023-03-05 DIAGNOSIS — C8318 Mantle cell lymphoma, lymph nodes of multiple sites: Secondary | ICD-10-CM

## 2023-03-05 DIAGNOSIS — Z5112 Encounter for antineoplastic immunotherapy: Secondary | ICD-10-CM | POA: Diagnosis not present

## 2023-03-05 MED ORDER — PEGFILGRASTIM INJECTION 6 MG/0.6ML ~~LOC~~
6.0000 mg | PREFILLED_SYRINGE | Freq: Once | SUBCUTANEOUS | Status: AC
  Administered 2023-03-05: 6 mg via SUBCUTANEOUS
  Filled 2023-03-05: qty 0.6

## 2023-03-05 NOTE — Patient Instructions (Signed)
[facility]  Discharge Instructions: Thank you for choosing [facility] to provide your oncology and hematology care.  If you have a lab appointment with the [facility] - please note that after April 8th, 2024, all labs will be drawn in the cancer center.  You do not have to check in or register with the main entrance as you have in the past but will complete your check-in in the cancer center.  Wear comfortable clothing and clothing appropriate for easy access to any Portacath or PICC line.   We strive to give you quality time with your provider. You may need to reschedule your appointment if you arrive late (15 or more minutes).  Arriving late affects you and other patients whose appointments are after yours.  Also, if you miss three or more appointments without notifying the office, you may be dismissed from the clinic at the provider's discretion.      For prescription refill requests, have your pharmacy contact our office and allow 72 hours for refills to be completed.    Today you received the following Neulasta injection, return as scheduled.   To help prevent nausea and vomiting after your treatment, we encourage you to take your nausea medication as directed.  BELOW ARE SYMPTOMS THAT SHOULD BE REPORTED IMMEDIATELY: *FEVER GREATER THAN 100.4 F (38 C) OR HIGHER *CHILLS OR SWEATING *NAUSEA AND VOMITING THAT IS NOT CONTROLLED WITH YOUR NAUSEA MEDICATION *UNUSUAL SHORTNESS OF BREATH *UNUSUAL BRUISING OR BLEEDING *URINARY PROBLEMS (pain or burning when urinating, or frequent urination) *BOWEL PROBLEMS (unusual diarrhea, constipation, pain near the anus) TENDERNESS IN MOUTH AND THROAT WITH OR WITHOUT PRESENCE OF ULCERS (sore throat, sores in mouth, or a toothache) UNUSUAL RASH, SWELLING OR PAIN  UNUSUAL VAGINAL DISCHARGE OR ITCHING   Items with * indicate a potential emergency and should be followed up as soon as possible or go to the Emergency  Department if any problems should occur.  Please show the CHEMOTHERAPY ALERT CARD or IMMUNOTHERAPY ALERT CARD at check-in to the Emergency Department and triage nurse.  Should you have questions after your visit or need to cancel or reschedule your appointment, please contact [facility] [phone]  and follow the prompts.  Office hours are 8:00 a.m. to 4:30 p.m. Monday - Friday. Please note that voicemails left after 4:00 p.m. may not be returned until the following business day.  We are closed weekends and major holidays. You have access to a nurse at all times for urgent questions. Please call the main number to the clinic [phone] and follow the prompts.  For any non-urgent questions, you may also contact your provider using MyChart. We now offer e-Visits for anyone 33 and older to request care online for non-urgent symptoms. For details visit [email or web address].   Also download the MyChart app! Go to the app store, search "MyChart", open the app, select [facility], and log in with your MyChart username and password.

## 2023-03-05 NOTE — Progress Notes (Signed)
[place] [facility] Work  Initial Assessment   Travis Wood is a 72 y.o. year old male contacted by phone. Clinical Social Work was referred by medical provider for assessment of psychosocial needs.   SDOH (Social Determinants of Health) assessments performed: Yes SDOH Interventions    Flowsheet Row Office Visit from 12/29/2022 in [facility] from 11/06/2022 in [facility] Clinical Support from 09/20/2021 in [facility] Office Visit from 10/29/2020 in [facility] [place] Interventions Intervention Not Indicated Intervention Not Indicated Intervention Not Indicated Intervention Not Indicated  Housing Interventions Intervention Not Indicated Intervention Not Indicated Intervention Not Indicated Intervention Not Indicated  Transportation Interventions -- Intervention Not Indicated Intervention Not Indicated Intervention Not Indicated  Utilities Interventions Intervention Not Indicated Intervention Not Indicated -- --  Alcohol Usage Interventions -- Intervention Not Indicated (Score <7) -- --  Depression Interventions/Treatment  -- -- [id number] Score <4 Follow-up Not Indicated --  Financial Strain Interventions -- Intervention Not Indicated Intervention Not Indicated Intervention Not Indicated  Physical Activity Interventions -- Intervention Not Indicated Intervention Not Indicated Intervention Not Indicated  Stress Interventions -- Intervention Not Indicated Intervention Not Indicated Intervention Not Indicated  Social Connections Interventions -- Intervention Not Indicated Intervention Not Indicated Intervention Not Indicated       SDOH Screenings   Food Insecurity: No Food Insecurity (12/29/2022)  Housing: Low Risk  (12/29/2022)  Transportation Needs: No Transportation Needs (11/06/2022)  Utilities: Not At Risk  (12/29/2022)  Alcohol Screen: Low Risk  (09/20/2021)  Depression (PHQ2-9): Low Risk  (12/29/2022)  Financial Resource Strain: Low Risk  (11/06/2022)  Physical Activity: Sufficiently Active (11/06/2022)  Social Connections: Socially Integrated (11/06/2022)  Stress: No Stress Concern Present (11/06/2022)  Tobacco Use: Medium Risk (03/05/2023)     Distress Screen completed: Yes    01/26/2019   12:36 PM  ONCBCN DISTRESS SCREENING  Screening Type Initial Screening  Distress experienced in past week (1-10) 0      Family/Social Information:  Housing Arrangement: patient lives with his significant other, [name]. Family members/support persons in your life? Pt states he has 2 sons who both reside about 20 minutes away who are available to assist as needed.  Pt retired 8 years ago from [employer] in [place].  Pt reports a very active lifestyle which includes golf a couple of times a week. 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  Income source: Paediatric nurse concerns: No Type of concern: None Food access concerns: no Religious or spiritual practice: Not known Services Currently in place:  none  Coping/ Adjustment to diagnosis: Patient understands treatment plan and what happens next? yes Concerns about diagnosis and/or treatment: Quality of life Patient reported stressors: Adjusting to my illness Hopes and/or priorities: Pt's priority is to start treatment w/ the hope of positive results Patient enjoys  golfing Current coping skills/ strengths: Ability for insight , Capable of independent living , Communication skills , Financial means , Motivation for treatment/growth , Physical Health , and Supportive family/friends     SUMMARY: Current SDOH Barriers:  No barriers identified at this time  Clinical Social Work Clinical Goal(s):  No clinical social work goals at this time  Interventions: Discussed common  feeling and emotions when being diagnosed with cancer, and the importance of support during treatment Informed patient of the support team roles and support  services at [facility] Provided CSW contact information and encouraged patient to call with any questions or concerns    Follow Up Plan: Patient will contact CSW with any support or resource needs Patient verbalizes understanding of plan: Yes    Henriette Combs, LCSW

## 2023-03-05 NOTE — Progress Notes (Signed)
Patient tolerated injection with no complaints voiced. Site clean and dry with no bruising or swelling noted at site. See MAR for details. Band aid applied.  Patient stable during and after injection. VSS with discharge and left in satisfactory condition with no s/s of distress noted.  

## 2023-03-13 NOTE — Progress Notes (Unsigned)
[facility]COL[facility] [address], [place] [address] Phone: [phone] Fax: [phone]  SYMPTOM MANAGEMENT CLINIC PROGRESS NOTE   Travis Wood [id number] 13-Oct-1951 72 y.o.  Travis Wood is managed by Dr. Ellin Wood for stage III mantle cell lymphoma  Actively treated with chemotherapy/immunotherapy/hormonal therapy: YES  Current therapy: Bendamustine + rituximab  Last treated: Cycle #1 03/02/2023   INTERVAL HISTORY:  Chief Complaint: Symptom management & chemotherapy follow-up  Travis Wood is managed by Dr. Ellin Wood for stage III mantle cell lymphoma.  He received cycle #1 of treatment with Bendamustine and rituximab on 03/02/2023 and 03/03/2023, with Neulasta given on 03/05/2023.  Since that time, he has been feeling ***. *** Nausea/vomiting/diarrhea *** Mouth sores *** Fatigue - energy ***% *** Appetite ***% *** Oral intake (food) *** Water/liquids *** *** Weight today is ***.  <<OTHER SYMPTOMS>> *** Changes in urine *** Rash or skin changes *** Swelling/fluid buildup *** Chest pain, SOB, DOE *** Neuropathy *** New pain (joint pain, headaches, leg pain) *** Bruising or bleeding *** B symptoms  BENDAMUSTINE: *** Cytopenias, nausea/vomiting, diarrhea, fatigue  RITUXIMAB: *** Severe skin and mouth reactions, hepatitis B reactivation, infusion/allergic reactions, progressive multifocal leukoencephalopathy, cytopenias, fatigue, nausea/vomiting, flulike symptoms   ASSESSMENT & PLAN:  ## STAGE III MANTLE CELL LYMPHOMA, TP53 NEGATIVE - Primary oncologist is Dr. Ellin Wood - Cycle #1 of treatment with Bendamustine and rituximab on 03/02/2023 and 03/03/2023 - Neulasta given on 03/05/2023. - PLAN: ***  # TLS prophylaxis - Received rasburicase 6 mg on 03/02/2023.  Taking allopurinol 300 mg daily. - Labs today *** - PLAN: ***  # *** - *** - PLAN: ***  # *** - *** - PLAN: ***  # *** - *** - PLAN: ***  # *** - *** - PLAN: ***  PLAN  SUMMARY: >> *** >> *** >> *** >> Next scheduled appointment with main oncologist: ***   REVIEW OF SYSTEMS: ***  Review of Systems  Past Medical History, Surgical history, Social history, and Family history were reviewed as documented elsewhere in chart, and were updated as appropriate.   OBJECTIVE:  Physical Exam: *** There were no vitals taken for this visit. ECOG: ***  Physical Exam  Lab Review:     Component Value Date/Time   NA 138 02/26/2023 1249   NA 143 11/12/2022 1034   K 4.6 02/26/2023 1249   CL 105 02/26/2023 1249   CO2 28 02/26/2023 1249   GLUCOSE 125 (H) 02/26/2023 1249   BUN 20 02/26/2023 1249   BUN 16 11/12/2022 1034   CREATININE 0.80 02/26/2023 1249   CALCIUM 8.7 (L) 02/26/2023 1249   PROT 5.9 (L) 02/26/2023 1249   PROT 6.0 11/12/2022 1034   ALBUMIN 3.9 02/26/2023 1249   ALBUMIN 4.5 11/12/2022 1034   AST 17 02/26/2023 1249   ALT 16 02/26/2023 1249   ALKPHOS 77 02/26/2023 1249   BILITOT 0.7 02/26/2023 1249   BILITOT 0.5 11/12/2022 1034   GFRNONAA >60 02/26/2023 1249   GFRAA 96 05/30/2020 0932       Component Value Date/Time   WBC 43.2 (H) 02/26/2023 1249   RBC 3.76 (L) 02/26/2023 1249   HGB 11.1 (L) 02/26/2023 1249   HGB CANCELED 11/12/2022 1034   HCT 35.4 (L) 02/26/2023 1249   HCT CANCELED 11/12/2022 1034   PLT 71 (L) 02/26/2023 1249   PLT CANCELED 11/12/2022 1034   MCV 94.1 02/26/2023 1249   MCV CANCELED 11/12/2022 1034   MCH 29.5 02/26/2023 1249  MCHC 31.4 02/26/2023 1249   RDW 15.2 02/26/2023 1249   RDW CANCELED 11/12/2022 1034   LYMPHSABS 41.0 (H) 02/26/2023 1249   LYMPHSABS CANCELED 11/12/2022 1034   MONOABS 0.4 02/26/2023 1249   EOSABS 0.0 02/26/2023 1249   EOSABS CANCELED 11/12/2022 1034   BASOSABS 0.0 02/26/2023 1249   BASOSABS CANCELED 11/12/2022 1034   -------------------------------  Imaging from last 24 hours (if applicable): Radiology interpretation: IR IMAGING GUIDED PORT INSERTION  Result Date:  03/04/2023 INDICATION: 72 year old male referred for port catheter EXAM: IMAGE GUIDED PORT CATHETER MEDICATIONS: None ANESTHESIA/SEDATION: Moderate (conscious) sedation was employed during this procedure. A total of Versed 1.0 mg and Fentanyl 50 mcg was administered intravenously. Moderate Sedation Time: 15 minutes. The patient's level of consciousness and vital signs were monitored continuously by radiology nursing throughout the procedure under my direct supervision. FLUOROSCOPY TIME:  Fluoroscopy Time: 0 minutes 6 seconds (1 mGy). COMPLICATIONS: None PROCEDURE: Informed written consent was obtained from the patient after a thorough discussion of the procedural risks, benefits and alternatives. All questions were addressed. Maximal Sterile Barrier Technique was utilized including caps, mask, sterile gowns, sterile gloves, sterile drape, hand hygiene and skin antiseptic. A timeout was performed prior to the initiation of the procedure. Ultrasound survey was performed with images stored and sent to PACs. Right IJ vein documented to be patent. The right neck and chest was prepped with chlorhexidine, and draped in the usual sterile fashion using maximum barrier technique (cap and mask, sterile gown, sterile gloves, large sterile sheet, hand hygiene and cutaneous antiseptic). Local anesthesia was attained by infiltration with 1% lidocaine without epinephrine. Ultrasound demonstrated patency of the right internal jugular vein, and this was documented with an image. Under real-time ultrasound guidance, this vein was accessed with a 21 gauge micropuncture needle and image documentation was performed. A small dermatotomy was made at the access site with an 11 scalpel. A 0.018" wire was advanced into the SVC and used to estimate the length of the internal catheter. The access needle exchanged for a 31F micropuncture vascular sheath. The 0.018" wire was then removed and a 0.035" wire advanced into the IVC. An appropriate  location for the subcutaneous reservoir was selected below the clavicle and an incision was made through the skin and underlying soft tissues. The subcutaneous tissues were then dissected using a combination of blunt and sharp surgical technique and a pocket was formed. A single lumen power injectable portacatheter was then tunneled through the subcutaneous tissues from the pocket to the dermatotomy and the port reservoir placed within the subcutaneous pocket. The venous access site was then serially dilated and a peel away vascular sheath placed over the wire. The wire was removed and the port catheter advanced into position under fluoroscopic guidance. The catheter tip is positioned in the cavoatrial junction. This was documented with a spot image. The portacatheter was then tested and found to flush and aspirate well. The port was flushed with saline followed by 100 units/mL heparinized saline. The pocket was then closed in two layers using first subdermal inverted interrupted absorbable sutures followed by a running subcuticular suture. The epidermis was then sealed with Dermabond. The dermatotomy at the venous access site was also seal with Dermabond. Patient tolerated the procedure well and remained hemodynamically stable throughout. No complications encountered and no significant blood loss encountered IMPRESSION: Status post right IJ port catheter. Signed, Yvone Neu. Miachel Roux, RPVI [facility] Radiology Electronically Signed   By: Gilmer Mor D.O.  On: 03/04/2023 16:18      WRAP UP:  All questions were answered. The patient knows to call the clinic with any problems, questions or concerns.  Medical decision making: ***  Time spent on visit: I spent {CHL ONC TIME VISIT - VHQIO:[id number]} counseling the patient face to face. The total time spent in the appointment was {CHL ONC TIME VISIT - KGMWN:[id number]} and more than 50% was on  counseling.  Carnella Guadalajara, PA-C  ***

## 2023-03-16 ENCOUNTER — Inpatient Hospital Stay: Payer: Medicare Other | Admitting: Physician Assistant

## 2023-03-16 ENCOUNTER — Inpatient Hospital Stay: Payer: Medicare Other

## 2023-03-16 VITALS — BP 110/60 | HR 54 | Temp 97.1°F | Resp 20 | Wt 199.3 lb

## 2023-03-16 DIAGNOSIS — C8318 Mantle cell lymphoma, lymph nodes of multiple sites: Secondary | ICD-10-CM

## 2023-03-16 DIAGNOSIS — Z5112 Encounter for antineoplastic immunotherapy: Secondary | ICD-10-CM | POA: Diagnosis not present

## 2023-03-16 LAB — COMPREHENSIVE METABOLIC PANEL
ALT: 12 U/L (ref 0–44)
AST: 13 U/L — ABNORMAL LOW (ref 15–41)
Albumin: 3.8 g/dL (ref 3.5–5.0)
Alkaline Phosphatase: 101 U/L (ref 38–126)
Anion gap: 2 — ABNORMAL LOW (ref 5–15)
BUN: 20 mg/dL (ref 8–23)
CO2: 28 mmol/L (ref 22–32)
Calcium: 8.3 mg/dL — ABNORMAL LOW (ref 8.9–10.3)
Chloride: 106 mmol/L (ref 98–111)
Creatinine, Ser: 0.69 mg/dL (ref 0.61–1.24)
GFR, Estimated: >60 mL/min (ref >60)
Glucose, Bld: 93 mg/dL (ref 70–99)
Potassium: 4.3 mmol/L (ref 3.5–5.1)
Sodium: 136 mmol/L (ref 135–145)
Total Bilirubin: 0.3 mg/dL (ref 0.3–1.2)
Total Protein: 5.8 g/dL — ABNORMAL LOW (ref 6.5–8.1)

## 2023-03-16 LAB — CBC WITH DIFFERENTIAL/PLATELET
Abs Immature Granulocytes: 0.07 10*3/uL (ref 0.00–0.07)
Basophils Absolute: 0.1 10*3/uL (ref 0.0–0.1)
Basophils Relative: 1 %
Eosinophils Absolute: 0.1 10*3/uL (ref 0.0–0.5)
Eosinophils Relative: 1 %
HCT: 36 % — ABNORMAL LOW (ref 39.0–52.0)
Hemoglobin: 11.3 g/dL — ABNORMAL LOW (ref 13.0–17.0)
Immature Granulocytes: 1 %
Lymphocytes Relative: 22 %
Lymphs Abs: 1.9 10*3/uL (ref 0.7–4.0)
MCH: 29.3 pg (ref 26.0–34.0)
MCHC: 31.4 g/dL (ref 30.0–36.0)
MCV: 93.3 fL (ref 80.0–100.0)
Monocytes Absolute: 0.3 10*3/uL (ref 0.1–1.0)
Monocytes Relative: 3 %
Neutro Abs: 6.5 10*3/uL (ref 1.7–7.7)
Neutrophils Relative %: 72 %
Platelets: 100 10*3/uL — ABNORMAL LOW (ref 150–400)
RBC: 3.86 MIL/uL — ABNORMAL LOW (ref 4.22–5.81)
RDW: 15.8 % — ABNORMAL HIGH (ref 11.5–15.5)
WBC: 8.9 10*3/uL (ref 4.0–10.5)
nRBC: 0 % (ref 0.0–0.2)

## 2023-03-16 LAB — PHOSPHORUS: Phosphorus: 3.5 mg/dL (ref 2.5–4.6)

## 2023-03-16 LAB — URIC ACID: Uric Acid, Serum: 5.2 mg/dL (ref 3.7–8.6)

## 2023-03-16 LAB — MAGNESIUM: Magnesium: 2.3 mg/dL (ref 1.7–2.4)

## 2023-03-16 MED ORDER — SODIUM CHLORIDE 0.9% FLUSH
10.0000 mL | Freq: Once | INTRAVENOUS | Status: AC
  Administered 2023-03-16: 10 mL via INTRAVENOUS

## 2023-03-16 MED ORDER — HEPARIN SOD (PORK) LOCK FLUSH 100 UNIT/ML IV SOLN
500.0000 [IU] | Freq: Once | INTRAVENOUS | Status: AC
  Administered 2023-03-16: 500 [IU] via INTRAVENOUS

## 2023-03-16 NOTE — Progress Notes (Signed)
Per Rojelio Brenner PA, patient does not need fluid today.

## 2023-03-16 NOTE — Patient Instructions (Signed)
[facility] at [facility] **VISIT SUMMARY & IMPORTANT INSTRUCTIONS **   You were seen today by Rojelio Brenner PA-C for your chemotherapy follow-up visit.   Your labs today did not show any major abnormalities. Keep up the great work with good nutrition and hydration.  FOLLOW-UP APPOINTMENT: You are scheduled for follow-up with Dr. Ellin Saba on 03/30/2023.  However, if you have any problems before that time, do not hesitate to reach out.  ** Thank you for trusting me with your healthcare!  I strive to provide all of my patients with quality care at each visit.  If you receive a survey for this visit, I would be so grateful to you for taking the time to provide feedback.  Thank you in advance!  ~ Wileen Duncanson                   Dr. Doreatha Massed   &   Rojelio Brenner, PA-C   - - - - - - - - - - - - - - - - - -    Thank you for choosing [facility] at [facility] to provide your oncology and hematology care.  To afford each patient quality time with our provider, please arrive at least 15 minutes before your scheduled appointment time.   If you have a lab appointment with the Cancer Center please come in thru the Main Entrance and check in at the main information desk.  You need to re-schedule your appointment should you arrive 10 or more minutes late.  We strive to give you quality time with our providers, and arriving late affects you and other patients whose appointments are after yours.  Also, if you no show three or more times for appointments you may be dismissed from the clinic at the providers discretion.     Again, thank you for choosing [facility].  Our hope is that these requests will decrease the amount of time that you wait before being seen by our physicians.       _____________________________________________________________  Should you have questions after your visit to [facility], please contact our  office at [phone] and follow the prompts.  Our office hours are 8:00 a.m. and 4:30 p.m. Monday - Friday.  Please note that voicemails left after 4:00 p.m. may not be returned until the following business day.  We are closed weekends and major holidays.  You do have access to a nurse 24-7, just call the main number to the clinic [phone] and do not press any options, hold on the line and a nurse will answer the phone.    For prescription refill requests, have your pharmacy contact our office and allow 72 hours.

## 2023-03-27 ENCOUNTER — Other Ambulatory Visit: Payer: Self-pay

## 2023-03-29 NOTE — Progress Notes (Signed)
[facility] [address], [place] [address]    Clinic Day:  03/30/2023  Referring physician: Doreatha Massed, MD  Patient Care Team: Doreatha Massed, MD as PCP - General (Hematology) Doreatha Massed, MD as Medical Oncologist (Medical Oncology) Therese Sarah, RN as Oncology Nurse Navigator (Medical Oncology)   ASSESSMENT & PLAN:   Assessment: 1.  Chronic lymphocytic leukemia: - Flow cytometry (01/26/2019): Monoclonal B-cell population consistent with CLL. - CT chest (12/12/2022): Large right hilar mass/bulky adenopathy measuring 7.5 x 6.4 cm encasing the right mainstem bronchus and lobar branches.  Additional mediastinal adenopathy, axillary and lower cervical adenopathy. - CT [facility] (12/12/2022): Severe splenomegaly measuring 21.5 cm.  Prominent retroperitoneal lymph nodes, left retroperitoneal nodes measuring 1.6 x 1.0 cm.  Subcapsular hypodense lesion of the peripheral liver dome measuring 1.6 x 1.1 cm.  Additional subcentimeter hypodense lesions too small to characterize. - No B symptoms.  Patient plays golf 3-4 times per week.  No recurrent infections. - CLL FISH panel with 13 q. deletion - T p53 mutation negative - IGHV hyper mutation present. - PET scan (01/08/2023): Marked right hilar adenopathy with lymph node conglomerate/large lymph node measuring 5 cm with SUV of 12.3.  There is hypermetabolic adenopathy above and below diaphragm with splenomegaly. - Bronchoscopy (01/28/2023) and biopsy of the right hilar mass by Dr. Tonia Brooms - Pathology: CD5 positive B-cell lymphoproliferative disorder, cyclin D1 positive, favoring mantle cell lymphoma.  FISH for t(11:14) positive indicating mantle cell lymphoma. - Cycle 1 of Bendamustine and rituximab started on 03/02/2023   2.  Social/family history: - Lives at home with his girlfriend.  Independent of ADLs and IADLs. - He retired 8 years ago after working at [employer] in [place].  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  Quit smoking in 1992.  Smoked 1 pack/day for 10 years. - Sister had breast cancer.  Another sister had ovarian and stomach cancer.  Father had colon cancer.  Mother had CLL.  Paternal aunt had lung cancer.  Another paternal aunt had breast cancer.    Plan: 1.  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 - Otherwise he did not have any significant side effects from chemotherapy. - Labs today: Normal LFTs with elevated total bilirubin of 1.4.  CBC shows improvement in platelet count to 106.  White count is normal at 4.4, down from 43 K. - He will proceed with cycle 2 of Bendamustine and rituximab.  Will increase dose of Bendamustine to 80 mg/m.  Cycle 1 was given at 70 mg/m. - RTC 4 weeks for follow-up with repeat labs and treatment.  PET scan will be after cycle 3.   2.  TLS prophylaxis: - Continue allopurinol 300 mg daily.     Orders Placed This Encounter  Procedures   Lactate dehydrogenase    Standing Status:   Future    Standing Expiration Date:   04/26/2024   Uric acid    Standing Status:   Future    Standing Expiration Date:   04/26/2024   Lactate dehydrogenase    Standing Status:   Future    Standing Expiration Date:   05/24/2024   Uric acid    Standing Status:   Future    Standing Expiration Date:   05/24/2024   Lactate dehydrogenase    Standing Status:   Future    Standing Expiration Date:   06/21/2024  Uric acid    Standing Status:   Future    Standing Expiration Date:   06/21/2024   Lactate dehydrogenase    Standing Status:   Future    Standing Expiration Date:   07/19/2024   Uric acid    Standing Status:   Future    Standing Expiration Date:   07/19/2024      I,Katie Daubenspeck,acting as a scribe for Doreatha Massed, MD.,have documented all relevant documentation on the behalf of Doreatha Massed, MD,as directed by  Doreatha Massed, MD while  in the presence of Doreatha Massed, MD.   I, Doreatha Massed MD, have reviewed the above documentation for accuracy and completeness, and I agree with the above.   Doreatha Massed, MD   4/29/20245:13 PM  CHIEF COMPLAINT:   Diagnosis: Generalized lymphadenopathy and CLL    Cancer Staging  Mantle cell lymphoma (HCC) Staging form: Hodgkin and Non-Hodgkin Lymphoma, AJCC 8th Edition - Clinical stage from 02/11/2023: Stage III (Mantle cell lymphoma) - Signed by Doreatha Massed, MD on 02/11/2023    Prior Therapy: none  Current Therapy:  Bendamustine and rituximab every 28 days for 6 cycles    HISTORY OF PRESENT ILLNESS:   Oncology History  Mantle cell lymphoma (HCC)  02/11/2023 Initial Diagnosis   Mantle cell lymphoma (HCC)   02/11/2023 Cancer Staging   Staging form: Hodgkin and Non-Hodgkin Lymphoma, AJCC 8th Edition - Clinical stage from 02/11/2023: Stage III (Mantle cell lymphoma) - Signed by Doreatha Massed, MD on 02/11/2023 Histopathologic type: Mantle cell lymphoma (Includes all variants: blastic, pleomorphic, small cell) Stage prefix: Initial diagnosis   03/02/2023 -  Chemotherapy   Patient is on Treatment Plan : NON-HODGKINS LYMPHOMA Rituximab D1 + Bendamustine D1,2 q28d x 6 cycles        INTERVAL HISTORY:   Travis Wood is a 72 y.o. male presenting to clinic today for follow up of generalized lymphadenopathy and CLL. He was last seen by me on 03/02/23. He was also seen by PA Rebekah in the symptom management clinic on 03/16/23.  Of note, he underwent port placement on 03/04/23.  Today, he states that he is doing well overall. His appetite level is at 100%. His energy level is at 85%.  PAST MEDICAL HISTORY:   Past Medical History: Past Medical History:  Diagnosis Date   Anemia    Cataract    CLL (chronic lymphocytic leukemia) (HCC)    Erectile dysfunction    History of bronchitis    History of colon polyps    Hyperlipemia    Osteoarthritis    Shortness  of breath dyspnea    with exertion    Surgical History: Past Surgical History:  Procedure Laterality Date   APPENDECTOMY     BRONCHIAL NEEDLE ASPIRATION BIOPSY  01/28/2023   Procedure: BRONCHIAL NEEDLE ASPIRATION BIOPSIES;  Surgeon: Josephine Igo, DO;  Location: [facility];  Service: Cardiopulmonary;;   CATARACT EXTRACTION W/PHACO Left 10/15/2015   Procedure: CATARACT EXTRACTION PHACO AND INTRAOCULAR LENS PLACEMENT LEFT EYE;  Surgeon: Gemma Payor, MD;  Location: [facility];  Service: Ophthalmology;  Laterality: Left;  CDE:12.20   CATARACT EXTRACTION W/PHACO Right 11/12/2015   Procedure: CATARACT EXTRACTION PHACO AND INTRAOCULAR LENS PLACEMENT RIGHT EYE:  CDE:  8.99;  Surgeon: Gemma Payor, MD;  Location: [facility];  Service: Ophthalmology;  Laterality: Right;   IR IMAGING GUIDED PORT INSERTION  03/04/2023   KNEE SURGERY     Left x 2    MASS EXCISION N/A 09/12/2015   Procedure: EXCISION SOFT  TISSUE NEOPLASM (6 CM), BACK;  Surgeon: Franky Macho, MD;  Location: [facility];  Service: General;  Laterality: N/A;   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   Procedure: TOTAL KNEE ARTHROPLASTY;  Surgeon: Durene Romans, MD;  Location: [facility];  Service: Orthopedics;  Laterality: Right;  70 mins   VIDEO BRONCHOSCOPY WITH ENDOBRONCHIAL ULTRASOUND Bilateral 01/28/2023   Procedure: VIDEO BRONCHOSCOPY WITH ENDOBRONCHIAL ULTRASOUND;  Surgeon: Josephine Igo, DO;  Location: [facility];  Service: Cardiopulmonary;  Laterality: Bilateral;    Social History: Social History   Socioeconomic History   Marital status: Media planner    Spouse name: Not on file   Number of children: 2   Years of education: 12 years   Highest education level: 12th grade  Occupational History   Occupation: RETIRED  Tobacco Use   Smoking status: Former    Packs/day: 1.00    Years: 10.00    Additional pack years: 0.00    Total pack years: 10.00    Types: Cigarettes, Pipe    Quit date: 09/10/1991    Years since quitting: 31.5    Smokeless tobacco: Never   Tobacco comments:    Smoked a pipe occasionally for 3 years.  Vaping Use   Vaping Use: Never used  Substance and Sexual Activity   Alcohol use: Not Currently    Alcohol/week: 1.0 standard drink of alcohol    Types: 1 Cans of beer per week   Drug use: Yes    Frequency: 1.0 times per week    Types: Marijuana   Sexual activity: Yes  Other Topics Concern   Not on file  Social History Narrative   2 sons who live close by   Lives with girlfriend   Social Determinants of Health   Financial Resource Strain: Low Risk  (11/06/2022)   Overall Financial Resource Strain (CARDIA)    Difficulty of Paying Living Expenses: Not hard at all  Food Insecurity: No Food Insecurity (12/29/2022)   Hunger Vital Sign    Worried About Running Out of Food in the Last Year: Never true    Ran Out of Food in the Last Year: Never true  Transportation Needs: No Transportation Needs (11/06/2022)   PRAPARE - Administrator, Civil Service (Medical): No    Lack of Transportation (Non-Medical): No  Physical Activity: Sufficiently Active (11/06/2022)   Exercise Vital Sign    Days of Exercise per Week: 7 days    Minutes of Exercise per Session: 30 min  Stress: No Stress Concern Present (11/06/2022)   [employer] of Occupational Health - Occupational Stress Questionnaire    Feeling of Stress : Not at all  Social Connections: Socially Integrated (11/06/2022)   Social Connection and Isolation Panel [NHANES]    Frequency of Communication with Friends and Family: More than three times a week    Frequency of Social Gatherings with Friends and Family: More than three times a week    Attends Religious Services: 1 to 4 times per year    Active Member of [employer] or Organizations: Yes    Attends Banker Meetings: More than 4 times per year    Marital Status: Living with partner  Intimate Partner Violence: Not At Risk (12/29/2022)   Humiliation, Afraid, Rape, and Kick  questionnaire    Fear of Current or Ex-Partner: No    Emotionally Abused: No    Physically Abused: No    Sexually Abused: No    Family History: Family History  Problem  Relation Age of Onset   Colon cancer Father        age 75    Colon polyps Father    Heart disease Father        Heart failure   Heart disease Mother    Cancer Sister        Uterine, and ovarian   Hypertension Brother    Obesity Sister    Heart disease Maternal Grandmother    Esophageal cancer Neg Hx    Liver cancer Neg Hx    Pancreatic cancer Neg Hx    Rectal cancer Neg Hx    Stomach cancer Neg Hx     Current Medications:  Current Outpatient Medications:    allopurinol (ZYLOPRIM) 300 MG tablet, Take 1 tablet (300 mg total) by mouth daily., Disp: 30 tablet, Rfl: 3   atorvastatin (LIPITOR) 40 MG tablet, Take 1 tablet (40 mg total) by mouth daily., Disp: 90 tablet, Rfl: 3   BENDAMUSTINE HCL IV, Inject into the vein every 28 (twenty-eight) days. Days 1 and 2 every 28 days, Disp: , Rfl:    ferrous sulfate 325 (65 FE) MG tablet, Take 325 mg by mouth daily with breakfast., Disp: , Rfl:    lidocaine-prilocaine (EMLA) cream, Apply to affected area once, Disp: 30 g, Rfl: 3   Multiple Vitamin (MULTIVITAMIN WITH MINERALS) TABS tablet, Take 1 tablet by mouth daily., Disp: , Rfl:    OLIVE LEAF EXTRACT PO, Take 1 tablet by mouth in the morning and at bedtime., Disp: , Rfl:    ondansetron (ZOFRAN) 4 MG tablet, Take 1 tablet (4 mg total) by mouth every 8 (eight) hours as needed for nausea or vomiting., Disp: 20 tablet, Rfl: 0   OVER THE COUNTER MEDICATION, Take 1 each by mouth 5 (five) times daily. [name], Disp: , Rfl:    prochlorperazine (COMPAZINE) 10 MG tablet, Take 1 tablet (10 mg total) by mouth every 6 (six) hours as needed for nausea or vomiting., Disp: 30 tablet, Rfl: 1   riTUXimab (RITUXAN IV), Inject into the vein every 28 (twenty-eight) days., Disp: , Rfl:    Vitamin D, Ergocalciferol, (DRISDOL) 1.25 MG (50000  UNIT) CAPS capsule, Take 1 capsule (50,000 Units total) by mouth every 7 (seven) days., Disp: 13 capsule, Rfl: 3 No current facility-administered medications for this visit.  Facility-Administered Medications Ordered in Other Visits:    sodium chloride flush (NS) 0.9 % injection 10 mL, 10 mL, Intracatheter, PRN, Doreatha Massed, MD, 10 mL at 03/30/23 1550   Allergies: No Known Allergies  REVIEW OF SYSTEMS:   Review of Systems  Constitutional:  Negative for chills, fatigue and fever.  HENT:   Negative for lump/mass, mouth sores, nosebleeds, sore throat and trouble swallowing.   Eyes:  Negative for eye problems.  Respiratory:  Negative for cough and shortness of breath.   Cardiovascular:  Negative for chest pain, leg swelling and palpitations.  Gastrointestinal:  Negative for abdominal pain, constipation, diarrhea, nausea and vomiting.  Genitourinary:  Negative for bladder incontinence, difficulty urinating, dysuria, frequency, hematuria and nocturia.   Musculoskeletal:  Negative for arthralgias, back pain, flank pain, myalgias and neck pain.  Skin:  Negative for itching and rash.  Neurological:  Negative for dizziness, headaches and numbness.  Hematological:  Does not bruise/bleed easily.  Psychiatric/Behavioral:  Negative for depression, sleep disturbance and suicidal ideas. The patient is not nervous/anxious.   All other systems reviewed and are negative.    VITALS:   There were no vitals taken for  this visit.  Wt Readings from Last 3 Encounters:  03/30/23 199 lb 12.8 oz (90.6 kg)  03/16/23 199 lb 4.7 oz (90.4 kg)  03/04/23 200 lb (90.7 kg)    There is no height or weight on file to calculate BMI.  Performance status (ECOG): 1 - Symptomatic but completely ambulatory  PHYSICAL EXAM:   Physical Exam Vitals and nursing note reviewed. Exam conducted with a chaperone present.  Constitutional:      Appearance: Normal appearance.  Cardiovascular:     Rate and Rhythm:  Normal rate and regular rhythm.     Pulses: Normal pulses.     Heart sounds: Normal heart sounds.  Pulmonary:     Effort: Pulmonary effort is normal.     Breath sounds: Normal breath sounds.  Abdominal:     Palpations: Abdomen is soft. There is no hepatomegaly, splenomegaly or mass.     Tenderness: There is no abdominal tenderness.  Musculoskeletal:     Right lower leg: No edema.     Left lower leg: No edema.  Lymphadenopathy:     Cervical: No cervical adenopathy.     Right cervical: No superficial, deep or posterior cervical adenopathy.    Left cervical: No superficial, deep or posterior cervical adenopathy.     Upper Body:     Right upper body: No supraclavicular or axillary adenopathy.     Left upper body: No supraclavicular or axillary adenopathy.  Neurological:     General: No focal deficit present.     Mental Status: He is alert and oriented to person, place, and time.  Psychiatric:        Mood and Affect: Mood normal.        Behavior: Behavior normal.     LABS:      Latest Ref Rng & Units 03/30/2023    8:07 AM 03/16/2023   10:05 AM 02/26/2023   12:49 PM  CBC  WBC 4.0 - 10.5 K/uL 4.4  8.9  43.2   Hemoglobin 13.0 - 17.0 g/dL 08.6  57.8  46.9   Hematocrit 39.0 - 52.0 % 35.5  36.0  35.4   Platelets 150 - 400 K/uL 106  100  71       Latest Ref Rng & Units 03/30/2023    8:07 AM 03/16/2023   10:05 AM 02/26/2023   12:49 PM  CMP  Glucose 70 - 99 mg/dL 629  93  528   BUN 8 - 23 mg/dL 18  20  20    Creatinine 0.61 - 1.24 mg/dL 4.13  2.44  0.10   Sodium 135 - 145 mmol/L 135  136  138   Potassium 3.5 - 5.1 mmol/L 4.0  4.3  4.6   Chloride 98 - 111 mmol/L 101  106  105   CO2 22 - 32 mmol/L 26  28  28    Calcium 8.9 - 10.3 mg/dL 8.4  8.3  8.7   Total Protein 6.5 - 8.1 g/dL 5.8  5.8  5.9   Total Bilirubin 0.3 - 1.2 mg/dL 1.4  0.3  0.7   Alkaline Phos 38 - 126 U/L 95  101  77   AST 15 - 41 U/L 22  13  17    ALT 0 - 44 U/L 18  12  16       No results found for: "CEA1", "CEA"  / No results found for: "CEA1", "CEA" Lab Results  Component Value Date   PSA1 0.6 06/06/2020   No results found for: "  VWU981" No results found for: "CAN125"  Lab Results  Component Value Date   TOTALPROTELP 6.0 01/26/2019   ALBUMINELP 3.9 01/26/2019   A1GS 0.2 01/26/2019   A2GS 0.5 01/26/2019   BETS 0.9 01/26/2019   GAMS 0.5 01/26/2019   MSPIKE Not Observed 01/26/2019   SPEI Comment 01/26/2019   Lab Results  Component Value Date   TIBC 310 08/21/2021   FERRITIN 421 (H) 08/21/2021   IRONPCTSAT 20 08/21/2021   Lab Results  Component Value Date   LDH 134 02/26/2023   LDH 140 12/29/2022   LDH 143 10/01/2020     STUDIES:   IR IMAGING GUIDED PORT INSERTION  Result Date: 03/04/2023 INDICATION: 72 year old male referred for port catheter EXAM: IMAGE GUIDED PORT CATHETER MEDICATIONS: None ANESTHESIA/SEDATION: Moderate (conscious) sedation was employed during this procedure. A total of Versed 1.0 mg and Fentanyl 50 mcg was administered intravenously. Moderate Sedation Time: 15 minutes. The patient's level of consciousness and vital signs were monitored continuously by radiology nursing throughout the procedure under my direct supervision. FLUOROSCOPY TIME:  Fluoroscopy Time: 0 minutes 6 seconds (1 mGy). COMPLICATIONS: None PROCEDURE: Informed written consent was obtained from the patient after a thorough discussion of the procedural risks, benefits and alternatives. All questions were addressed. Maximal Sterile Barrier Technique was utilized including caps, mask, sterile gowns, sterile gloves, sterile drape, hand hygiene and skin antiseptic. A timeout was performed prior to the initiation of the procedure. Ultrasound survey was performed with images stored and sent to PACs. Right IJ vein documented to be patent. The right neck and chest was prepped with chlorhexidine, and draped in the usual sterile fashion using maximum barrier technique (cap and mask, sterile gown, sterile gloves, large  sterile sheet, hand hygiene and cutaneous antiseptic). Local anesthesia was attained by infiltration with 1% lidocaine without epinephrine. Ultrasound demonstrated patency of the right internal jugular vein, and this was documented with an image. Under real-time ultrasound guidance, this vein was accessed with a 21 gauge micropuncture needle and image documentation was performed. A small dermatotomy was made at the access site with an 11 scalpel. A 0.018" wire was advanced into the SVC and used to estimate the length of the internal catheter. The access needle exchanged for a 60F micropuncture vascular sheath. The 0.018" wire was then removed and a 0.035" wire advanced into the IVC. An appropriate location for the subcutaneous reservoir was selected below the clavicle and an incision was made through the skin and underlying soft tissues. The subcutaneous tissues were then dissected using a combination of blunt and sharp surgical technique and a pocket was formed. A single lumen power injectable portacatheter was then tunneled through the subcutaneous tissues from the pocket to the dermatotomy and the port reservoir placed within the subcutaneous pocket. The venous access site was then serially dilated and a peel away vascular sheath placed over the wire. The wire was removed and the port catheter advanced into position under fluoroscopic guidance. The catheter tip is positioned in the cavoatrial junction. This was documented with a spot image. The portacatheter was then tested and found to flush and aspirate well. The port was flushed with saline followed by 100 units/mL heparinized saline. The pocket was then closed in two layers using first subdermal inverted interrupted absorbable sutures followed by a running subcuticular suture. The epidermis was then sealed with Dermabond. The dermatotomy at the venous access site was also seal with Dermabond. Patient tolerated the procedure well and remained hemodynamically  stable throughout. No complications encountered  and no significant blood loss encountered IMPRESSION: Status post right IJ port catheter. Signed, Yvone Neu. Miachel Roux, RPVI [facility] Radiology Electronically Signed   By: Gilmer Mor D.O.   On: 03/04/2023 16:18

## 2023-03-30 ENCOUNTER — Inpatient Hospital Stay (HOSPITAL_BASED_OUTPATIENT_CLINIC_OR_DEPARTMENT_OTHER): Payer: Medicare Other | Admitting: Hematology

## 2023-03-30 ENCOUNTER — Inpatient Hospital Stay: Payer: Medicare Other

## 2023-03-30 ENCOUNTER — Inpatient Hospital Stay: Payer: Medicare Other | Admitting: Dietician

## 2023-03-30 VITALS — BP 115/82 | HR 88 | Temp 96.9°F | Resp 20 | Wt 199.8 lb

## 2023-03-30 DIAGNOSIS — C8318 Mantle cell lymphoma, lymph nodes of multiple sites: Secondary | ICD-10-CM

## 2023-03-30 DIAGNOSIS — Z5112 Encounter for antineoplastic immunotherapy: Secondary | ICD-10-CM | POA: Diagnosis not present

## 2023-03-30 LAB — CBC WITH DIFFERENTIAL/PLATELET
Abs Immature Granulocytes: 0.11 10*3/uL — ABNORMAL HIGH (ref 0.00–0.07)
Basophils Absolute: 0 10*3/uL (ref 0.0–0.1)
Basophils Relative: 1 %
Eosinophils Absolute: 0 10*3/uL (ref 0.0–0.5)
Eosinophils Relative: 0 %
HCT: 35.5 % — ABNORMAL LOW (ref 39.0–52.0)
Hemoglobin: 11.2 g/dL — ABNORMAL LOW (ref 13.0–17.0)
Immature Granulocytes: 3 %
Lymphocytes Relative: 26 %
Lymphs Abs: 1.2 10*3/uL (ref 0.7–4.0)
MCH: 29.3 pg (ref 26.0–34.0)
MCHC: 31.5 g/dL (ref 30.0–36.0)
MCV: 92.9 fL (ref 80.0–100.0)
Monocytes Absolute: 0.3 10*3/uL (ref 0.1–1.0)
Monocytes Relative: 7 %
Neutro Abs: 2.8 10*3/uL (ref 1.7–7.7)
Neutrophils Relative %: 63 %
Platelets: 106 10*3/uL — ABNORMAL LOW (ref 150–400)
RBC: 3.82 MIL/uL — ABNORMAL LOW (ref 4.22–5.81)
RDW: 15.3 % (ref 11.5–15.5)
WBC: 4.4 10*3/uL (ref 4.0–10.5)
nRBC: 0 % (ref 0.0–0.2)

## 2023-03-30 LAB — COMPREHENSIVE METABOLIC PANEL
ALT: 18 U/L (ref 0–44)
AST: 22 U/L (ref 15–41)
Albumin: 3.7 g/dL (ref 3.5–5.0)
Alkaline Phosphatase: 95 U/L (ref 38–126)
Anion gap: 8 (ref 5–15)
BUN: 18 mg/dL (ref 8–23)
CO2: 26 mmol/L (ref 22–32)
Calcium: 8.4 mg/dL — ABNORMAL LOW (ref 8.9–10.3)
Chloride: 101 mmol/L (ref 98–111)
Creatinine, Ser: 0.75 mg/dL (ref 0.61–1.24)
GFR, Estimated: >60 mL/min (ref >60)
Glucose, Bld: 110 mg/dL — ABNORMAL HIGH (ref 70–99)
Potassium: 4 mmol/L (ref 3.5–5.1)
Sodium: 135 mmol/L (ref 135–145)
Total Bilirubin: 1.4 mg/dL — ABNORMAL HIGH (ref 0.3–1.2)
Total Protein: 5.8 g/dL — ABNORMAL LOW (ref 6.5–8.1)

## 2023-03-30 LAB — MAGNESIUM: Magnesium: 2.1 mg/dL (ref 1.7–2.4)

## 2023-03-30 MED ORDER — SODIUM CHLORIDE 0.9 % IV SOLN: Freq: Once | INTRAVENOUS | Status: AC

## 2023-03-30 MED ORDER — PALONOSETRON HCL INJECTION 0.25 MG/5ML
0.2500 mg | Freq: Once | INTRAVENOUS | Status: AC
  Administered 2023-03-30: 0.25 mg via INTRAVENOUS
  Filled 2023-03-30: qty 5

## 2023-03-30 MED ORDER — SODIUM CHLORIDE 0.9% FLUSH
10.0000 mL | INTRAVENOUS | Status: DC | PRN
  Administered 2023-03-30: 10 mL

## 2023-03-30 MED ORDER — ACETAMINOPHEN 325 MG PO TABS
650.0000 mg | ORAL_TABLET | Freq: Once | ORAL | Status: AC
  Administered 2023-03-30: 650 mg via ORAL
  Filled 2023-03-30: qty 2

## 2023-03-30 MED ORDER — SODIUM CHLORIDE 0.9 % IV SOLN
375.0000 mg/m2 | Freq: Once | INTRAVENOUS | Status: AC
  Administered 2023-03-30: 800 mg via INTRAVENOUS
  Filled 2023-03-30: qty 50

## 2023-03-30 MED ORDER — SODIUM CHLORIDE 0.9 % IV SOLN
80.0000 mg/m2 | Freq: Once | INTRAVENOUS | Status: AC
  Administered 2023-03-30: 172.5 mg via INTRAVENOUS
  Filled 2023-03-30: qty 6.9

## 2023-03-30 MED ORDER — FAMOTIDINE IN NACL 20-0.9 MG/50ML-% IV SOLN
20.0000 mg | Freq: Once | INTRAVENOUS | Status: AC
  Administered 2023-03-30: 20 mg via INTRAVENOUS
  Filled 2023-03-30: qty 50

## 2023-03-30 MED ORDER — SODIUM CHLORIDE 0.9 % IV SOLN
10.0000 mg | Freq: Once | INTRAVENOUS | Status: AC
  Administered 2023-03-30: 10 mg via INTRAVENOUS
  Filled 2023-03-30: qty 10

## 2023-03-30 MED ORDER — CETIRIZINE HCL 10 MG/ML IV SOLN
10.0000 mg | Freq: Once | INTRAVENOUS | Status: AC
  Administered 2023-03-30: 10 mg via INTRAVENOUS
  Filled 2023-03-30: qty 1

## 2023-03-30 MED ORDER — HEPARIN SOD (PORK) LOCK FLUSH 100 UNIT/ML IV SOLN
500.0000 [IU] | Freq: Once | INTRAVENOUS | Status: AC | PRN
  Administered 2023-03-30: 500 [IU]

## 2023-03-30 NOTE — Progress Notes (Signed)
Labs reviewed and ok to treat today per MD.   Treatment given per orders. Patient tolerated it well without problems. Vitals stable and discharged home from clinic ambulatory. Follow up as scheduled.

## 2023-03-30 NOTE — Progress Notes (Signed)
Patient has been examined by Dr. Katragadda. Vital signs and labs have been reviewed by MD - ANC, Creatinine, LFTs, hemoglobin, and platelets are within treatment parameters per M.D. - pt may proceed with treatment.  Primary RN and pharmacy notified.  

## 2023-03-30 NOTE — Patient Instructions (Signed)
[facility] Discharge Instructions   You were seen and examined today by Dr. Katragadda.  He reviewed the results of your lab work which are normal/stable.   We will proceed with your treatment today.  Return as scheduled.    Thank you for choosing [facility] at [facility] to provide your oncology and hematology care.  To afford each patient quality time with our provider, please arrive at least 15 minutes before your scheduled appointment time.   If you have a lab appointment with the Cancer Center please come in thru the Main Entrance and check in at the main information desk.  You need to re-schedule your appointment should you arrive 10 or more minutes late.  We strive to give you quality time with our providers, and arriving late affects you and other patients whose appointments are after yours.  Also, if you no show three or more times for appointments you may be dismissed from the clinic at the providers discretion.     Again, thank you for choosing [facility].  Our hope is that these requests will decrease the amount of time that you wait before being seen by our physicians.       _____________________________________________________________  Should you have questions after your visit to  Cancer Center, please contact our office at [phone] and follow the prompts.  Our office hours are 8:00 a.m. and 4:30 p.m. Monday - Friday.  Please note that voicemails left after 4:00 p.m. may not be returned until the following business day.  We are closed weekends and major holidays.  You do have access to a nurse 24-7, just call the main number to the clinic [phone] and do not press any options, hold on the line and a nurse will answer the phone.    For prescription refill requests, have your pharmacy contact our office and allow 72 hours.    Due to Covid, you will need to wear a mask upon entering  the hospital. If you do not have a mask, a mask will be given to you at the Main Entrance upon arrival. For doctor visits, patients may have 1 support person age 18 or older with them. For treatment visits, patients can not have anyone with them due to social distancing guidelines and our immunocompromised population.      

## 2023-03-30 NOTE — Progress Notes (Signed)
Patient with hypersensitivity with first rituximab.  Adding Famotidine 20 mg IVPB to premedication.  Ok per Dr Carilyn Goodpasture, PharmD

## 2023-03-30 NOTE — Patient Instructions (Signed)
[facility]  Discharge Instructions: Thank you for choosing [facility] to provide your oncology and hematology care.  If you have a lab appointment with the Cancer Center - please note that after April 8th, 2024, all labs will be drawn in the cancer center.  You do not have to check in or register with the main entrance as you have in the past but will complete your check-in in the cancer center.  Wear comfortable clothing and clothing appropriate for easy access to any Portacath or PICC line.   We strive to give you quality time with your provider. You may need to reschedule your appointment if you arrive late (15 or more minutes).  Arriving late affects you and other patients whose appointments are after yours.  Also, if you miss three or more appointments without notifying the office, you may be dismissed from the clinic at the provider's discretion.      For prescription refill requests, have your pharmacy contact our office and allow 72 hours for refills to be completed.    Today you received the following chemotherapy and/or immunotherapy agents rituxan, bendamustine   To help prevent nausea and vomiting after your treatment, we encourage you to take your nausea medication as directed.  BELOW ARE SYMPTOMS THAT SHOULD BE REPORTED IMMEDIATELY: *FEVER GREATER THAN 100.4 F (38 C) OR HIGHER *CHILLS OR SWEATING *NAUSEA AND VOMITING THAT IS NOT CONTROLLED WITH YOUR NAUSEA MEDICATION *UNUSUAL SHORTNESS OF BREATH *UNUSUAL BRUISING OR BLEEDING *URINARY PROBLEMS (pain or burning when urinating, or frequent urination) *BOWEL PROBLEMS (unusual diarrhea, constipation, pain near the anus) TENDERNESS IN MOUTH AND THROAT WITH OR WITHOUT PRESENCE OF ULCERS (sore throat, sores in mouth, or a toothache) UNUSUAL RASH, SWELLING OR PAIN  UNUSUAL VAGINAL DISCHARGE OR ITCHING   Items with * indicate a potential emergency and should be followed up as soon as possible or go  to the Emergency Department if any problems should occur.  Please show the CHEMOTHERAPY ALERT CARD or IMMUNOTHERAPY ALERT CARD at check-in to the Emergency Department and triage nurse.  Should you have questions after your visit or need to cancel or reschedule your appointment, please contact [facility] [phone]  and follow the prompts.  Office hours are 8:00 a.m. to 4:30 p.m. Monday - Friday. Please note that voicemails left after 4:00 p.m. may not be returned until the following business day.  We are closed weekends and major holidays. You have access to a nurse at all times for urgent questions. Please call the main number to the clinic [phone] and follow the prompts.  For any non-urgent questions, you may also contact your provider using MyChart. We now offer e-Visits for anyone 61 and older to request care online for non-urgent symptoms. For details visit [email or web address].   Also download the MyChart app! Go to the app store, search "MyChart", open the app, select [place], and log in with your MyChart username and password.

## 2023-03-30 NOTE — Progress Notes (Signed)
Nutrition Follow-up:  Patient with mantle cell lymphoma of lymph nodes, multiple regions. He is receiving rituximab + bendamustine q28d (start 4/1)   Met with patient during infusion. He reports tolerating first treatment well. He has a good appetite and eating 3 good meals plus snacks. He drinks 1-2 protein shakes (core power/ensure). Patient would like some chocolate samples if available. Patient has worked to increase intake of water, currently drinking ~60 ounces. He denies nutrition impact symptoms. Patient reports staying active and enjoying being [place] the [place] since.   Medications: reviewed   Labs: reviewed   Anthropometrics: Wt 199 lb 12.8 oz today stable   4/1 - 201 lb    NUTRITION DIAGNOSIS: Food and nutrition related knowledge deficit improved    INTERVENTION:  Continue eating 3 meals + snacks  Continue protein shakes - samples of chocolate Ensure Complete provided today  Encouraged activity as able     MONITORING, EVALUATION, GOAL: weight trends, intake   NEXT VISIT: To be scheduled as needed with treatment

## 2023-03-31 ENCOUNTER — Inpatient Hospital Stay: Payer: Medicare Other

## 2023-03-31 VITALS — BP 93/54 | HR 60 | Temp 98.1°F | Resp 18

## 2023-03-31 DIAGNOSIS — C8318 Mantle cell lymphoma, lymph nodes of multiple sites: Secondary | ICD-10-CM

## 2023-03-31 DIAGNOSIS — Z5112 Encounter for antineoplastic immunotherapy: Secondary | ICD-10-CM | POA: Diagnosis not present

## 2023-03-31 MED ORDER — HEPARIN SOD (PORK) LOCK FLUSH 100 UNIT/ML IV SOLN
500.0000 [IU] | Freq: Once | INTRAVENOUS | Status: AC | PRN
  Administered 2023-03-31: 500 [IU]

## 2023-03-31 MED ORDER — SODIUM CHLORIDE 0.9 % IV SOLN: Freq: Once | INTRAVENOUS | Status: AC

## 2023-03-31 MED ORDER — SODIUM CHLORIDE 0.9 % IV SOLN
80.0000 mg/m2 | Freq: Once | INTRAVENOUS | Status: AC
  Administered 2023-03-31: 172.5 mg via INTRAVENOUS
  Filled 2023-03-31: qty 6.9

## 2023-03-31 MED ORDER — PEGFILGRASTIM 6 MG/0.6ML ~~LOC~~ PSKT
6.0000 mg | PREFILLED_SYRINGE | Freq: Once | SUBCUTANEOUS | Status: DC
  Filled 2023-03-31: qty 0.6

## 2023-03-31 MED ORDER — SODIUM CHLORIDE 0.9% FLUSH
10.0000 mL | INTRAVENOUS | Status: DC | PRN
  Administered 2023-03-31: 10 mL

## 2023-03-31 MED ORDER — SODIUM CHLORIDE 0.9 % IV SOLN
10.0000 mg | Freq: Once | INTRAVENOUS | Status: AC
  Administered 2023-03-31: 10 mg via INTRAVENOUS
  Filled 2023-03-31: qty 10

## 2023-03-31 NOTE — Progress Notes (Signed)
Patient presents today for chemotherapy infusion of Bendeka. Patient is in satisfactory condition with no new complaints voiced.  Vital signs are stable.  Labs reviewed by Dr. Ellin Saba during the office visit and all labs are within treatment parameters.  We will proceed with treatment per MD orders.   Patient tolerated treatment well with no complaints voiced.  Patient left ambulatory in stable condition.  Vital signs stable at discharge.  Follow up as scheduled.

## 2023-03-31 NOTE — Patient Instructions (Signed)
[facility] [place]  Discharge Instructions: Thank you for choosing [facility] to provide your oncology and hematology care.  If you have a lab appointment with the Cancer Center - please note that after April 8th, 2024, all labs will be drawn in the cancer center.  You do not have to check in or register with the main entrance as you have in the past but will complete your check-in in the cancer center.  Wear comfortable clothing and clothing appropriate for easy access to any Portacath or PICC line.   We strive to give you quality time with your provider. You may need to reschedule your appointment if you arrive late (15 or more minutes).  Arriving late affects you and other patients whose appointments are after yours.  Also, if you miss three or more appointments without notifying the office, you may be dismissed from the clinic at the provider's discretion.      For prescription refill requests, have your pharmacy contact our office and allow 72 hours for refills to be completed.    Today you received the following chemotherapy and/or immunotherapy agents Bendeka.  Bendamustine Injection What is this medication? BENDAMUSTINE (BEN da MUS teen) treats leukemia and lymphoma. It works by slowing down the growth of cancer cells. This medicine may be used for other purposes; ask your health care provider or pharmacist if you have questions. COMMON BRAND NAME(S): BELRAPZO, BENDEKA, Treanda, VIVIMUSTA What should I tell my care team before I take this medication? They need to know if you have any of these conditions: Infection, especially a viral infection, such as chickenpox, cold sores, herpes Kidney disease Liver disease An unusual or allergic reaction to bendamustine, mannitol, other medications, foods, dyes, or preservatives Pregnant or trying to get pregnant Breast-feeding How should I use this medication? This medication is injected into a vein. It is given  by your care team in a hospital or clinic setting. Talk to your care team about the use of this medication in children. Special care may be needed. Overdosage: If you think you have taken too much of this medicine contact a poison control center or emergency room at once. NOTE: This medicine is only for you. Do not share this medicine with others. What if I miss a dose? Keep appointments for follow-up doses. It is important not to miss your dose. Call your care team if you are unable to keep an appointment. What may interact with this medication? Do not take this medication with any of the following: Clozapine This medication may also interact with the following: Atazanavir Cimetidine Ciprofloxacin Enoxacin Fluvoxamine Medications for seizures, such as carbamazepine, phenobarbital Mexiletine Rifampin Tacrine Thiabendazole Zileuton This list may not describe all possible interactions. Give your health care provider a list of all the medicines, herbs, non-prescription drugs, or dietary supplements you use. Also tell them if you smoke, drink alcohol, or use illegal drugs. Some items may interact with your medicine. What should I watch for while using this medication? Visit your care team for regular checks on your progress. This medication may make you feel generally unwell. This is not uncommon, as chemotherapy can affect healthy cells as well as cancer cells. Report any side effects. Continue your course of treatment even though you feel ill unless your care team tells you to stop. You may need blood work while taking this medication. This medication may increase your risk of getting an infection. Call your care team for advice if you get   a fever, chills, sore throat, or other symptoms of a cold or flu. Do not treat yourself. Try to avoid being around people who are sick. This medication may cause serious skin reactions. They can happen weeks to months after starting the medication. Contact  your care team right away if you notice fevers or flu-like symptoms with a rash. The rash may be red or purple and then turn into blisters or peeling of the skin. You may also notice a red rash with swelling of the face, lips, or lymph nodes in your neck or under your arms. In some patients, this medication may cause a serious brain infection that may cause death. If you have any problems seeing, thinking, speaking, walking, or standing, tell your care team right away. If you cannot reach your care team, urgently seek other source of medical care. This medication may increase your risk to bruise or bleed. Call your care team if you notice any unusual bleeding. Talk to your care team about your risk of cancer. You may be more at risk for certain types of cancer if you take this medication. Talk to your care team about your risk of skin cancer. You may be more at risk for skin cancer if you take this medication. Talk to your care team if you or your partner wish to become pregnant or think either of you might be pregnant. This medication can cause serious birth defects if taken during pregnancy or for up to 6 months after the last dose. A negative pregnancy test is required before starting this medication. A reliable form of contraception is recommended while taking this medication and for 6 months after the last dose. Talk to your care team about reliable forms of contraception. Wear a condom while taking this medication and for at least 3 months after the last dose. Do not breast-feed while taking this medication or for at least 1 week after the last dose. This medication may cause infertility. Talk to your care team if you are concerned about your fertility. What side effects may I notice from receiving this medication? Side effects that you should report to your care team as soon as possible: Allergic reactions--skin rash, itching, hives, swelling of the face, lips, tongue, or throat Infection--fever,  chills, cough, sore throat, wounds that don't heal, pain or trouble when passing urine, general feeling of discomfort or being unwell Infusion reactions--chest pain, shortness of breath or trouble breathing, feeling faint or lightheaded Liver injury--right upper belly pain, loss of appetite, nausea, light-colored stool, dark yellow or brown urine, yellowing skin or eyes, unusual weakness or fatigue Low red blood cell level--unusual weakness or fatigue, dizziness, headache, trouble breathing Painful swelling, warmth, or redness of the skin, blisters or sores at the infusion site Rash, fever, and swollen lymph nodes Redness, blistering, peeling, or loosening of the skin, including inside the mouth Tumor lysis syndrome (TLS)--nausea, vomiting, diarrhea, decrease in the amount of urine, dark urine, unusual weakness or fatigue, confusion, muscle pain or cramps, fast or irregular heartbeat, joint pain Unusual bruising or bleeding Side effects that usually do not require medical attention (report to your care team if they continue or are bothersome): Diarrhea Fatigue Headache Loss of appetite Nausea Vomiting This list may not describe all possible side effects. Call your doctor for medical advice about side effects. You may report side effects to FDA at 1-800-FDA-1088. Where should I keep my medication? This medication is given in a hospital or clinic. It will not be stored   at home. NOTE: This sheet is a summary. It may not cover all possible information. If you have questions about this medicine, talk to your doctor, pharmacist, or health care provider.  2023 Elsevier/Gold Standard (2022-02-24 00:00:00)       To help prevent nausea and vomiting after your treatment, we encourage you to take your nausea medication as directed.  BELOW ARE SYMPTOMS THAT SHOULD BE REPORTED IMMEDIATELY: *FEVER GREATER THAN 100.4 F (38 C) OR HIGHER *CHILLS OR SWEATING *NAUSEA AND VOMITING THAT IS NOT CONTROLLED  WITH YOUR NAUSEA MEDICATION *UNUSUAL SHORTNESS OF BREATH *UNUSUAL BRUISING OR BLEEDING *URINARY PROBLEMS (pain or burning when urinating, or frequent urination) *BOWEL PROBLEMS (unusual diarrhea, constipation, pain near the anus) TENDERNESS IN MOUTH AND THROAT WITH OR WITHOUT PRESENCE OF ULCERS (sore throat, sores in mouth, or a toothache) UNUSUAL RASH, SWELLING OR PAIN  UNUSUAL VAGINAL DISCHARGE OR ITCHING   Items with * indicate a potential emergency and should be followed up as soon as possible or go to the Emergency Department if any problems should occur.  Please show the CHEMOTHERAPY ALERT CARD or IMMUNOTHERAPY ALERT CARD at check-in to the Emergency Department and triage nurse.  Should you have questions after your visit or need to cancel or reschedule your appointment, please contact [facility] [place] [phone]  and follow the prompts.  Office hours are 8:00 a.m. to 4:30 p.m. Monday - Friday. Please note that voicemails left after 4:00 p.m. may not be returned until the following business day.  We are closed weekends and major holidays. You have access to a nurse at all times for urgent questions. Please call the main number to the clinic [phone] and follow the prompts.  For any non-urgent questions, you may also contact your provider using MyChart. We now offer e-Visits for anyone 18 and older to request care online for non-urgent symptoms. For details visit [email or web address].   Also download the MyChart app! Go to the app store, search "MyChart", open the app, select [place], and log in with your MyChart username and password.   

## 2023-04-01 ENCOUNTER — Inpatient Hospital Stay: Payer: Medicare Other | Attending: Hematology

## 2023-04-01 ENCOUNTER — Other Ambulatory Visit: Payer: Self-pay

## 2023-04-01 DIAGNOSIS — Z5112 Encounter for antineoplastic immunotherapy: Secondary | ICD-10-CM | POA: Diagnosis present

## 2023-04-01 DIAGNOSIS — C9110 Chronic lymphocytic leukemia of B-cell type not having achieved remission: Secondary | ICD-10-CM | POA: Insufficient documentation

## 2023-04-01 DIAGNOSIS — Z79899 Other long term (current) drug therapy: Secondary | ICD-10-CM | POA: Insufficient documentation

## 2023-04-01 DIAGNOSIS — Z1152 Encounter for screening for COVID-19: Secondary | ICD-10-CM | POA: Diagnosis not present

## 2023-04-01 DIAGNOSIS — Z5189 Encounter for other specified aftercare: Secondary | ICD-10-CM | POA: Diagnosis not present

## 2023-04-01 DIAGNOSIS — Z5111 Encounter for antineoplastic chemotherapy: Secondary | ICD-10-CM | POA: Diagnosis present

## 2023-04-01 DIAGNOSIS — Z79620 Long term (current) use of immunosuppressive biologic: Secondary | ICD-10-CM | POA: Insufficient documentation

## 2023-04-01 DIAGNOSIS — C8312 Mantle cell lymphoma, intrathoracic lymph nodes: Secondary | ICD-10-CM | POA: Insufficient documentation

## 2023-04-01 DIAGNOSIS — Z79630 Long term (current) use of alkylating agent: Secondary | ICD-10-CM | POA: Insufficient documentation

## 2023-04-01 DIAGNOSIS — C8318 Mantle cell lymphoma, lymph nodes of multiple sites: Secondary | ICD-10-CM

## 2023-04-01 DIAGNOSIS — R5081 Fever presenting with conditions classified elsewhere: Secondary | ICD-10-CM | POA: Diagnosis not present

## 2023-04-01 MED ORDER — PEGFILGRASTIM 6 MG/0.6ML ~~LOC~~ PSKT
6.0000 mg | PREFILLED_SYRINGE | Freq: Once | SUBCUTANEOUS | Status: AC
  Administered 2023-04-01: 6 mg via SUBCUTANEOUS
  Filled 2023-04-01: qty 0.6

## 2023-04-01 MED ORDER — PEGFILGRASTIM 6 MG/0.6ML ~~LOC~~ PSKT
6.0000 mg | PREFILLED_SYRINGE | Freq: Once | SUBCUTANEOUS | Status: DC
  Filled 2023-04-01: qty 0.6

## 2023-04-01 NOTE — Progress Notes (Signed)
Patient presents today for Neulasta injection application.  Patient is in satisfactory condition and vital signs are stable.  Neulasta applied to R arm with no issues.  Patient left ambulatory in stable condition.

## 2023-04-01 NOTE — Progress Notes (Unsigned)
OnPro added as day 3 - not applied to patient on C2D2 treatment day.  Pryor Ochoa, PharmD

## 2023-04-01 NOTE — Patient Instructions (Signed)
[facility]  Discharge Instructions: Thank you for choosing [facility] to provide your oncology and hematology care.  If you have a lab appointment with the Cancer Center - please note that after April 8th, 2024, all labs will be drawn in the cancer center.  You do not have to check in or register with the main entrance as you have in the past but will complete your check-in in the cancer center.  Wear comfortable clothing and clothing appropriate for easy access to any Portacath or PICC line.   We strive to give you quality time with your provider. You may need to reschedule your appointment if you arrive late (15 or more minutes).  Arriving late affects you and other patients whose appointments are after yours.  Also, if you miss three or more appointments without notifying the office, you may be dismissed from the clinic at the provider's discretion.      For prescription refill requests, have your pharmacy contact our office and allow 72 hours for refills to be completed.    Today you received the following chemotherapy and/or immunotherapy agents Neulasta.  Pegfilgrastim Injection What is this medication? PEGFILGRASTIM (PEG fil gra stim) lowers the risk of infection in people who are receiving chemotherapy. It works by Systems analyst make more white blood cells, which protects your body from infection. It may also be used to help people who have been exposed to high doses of radiation. This medicine may be used for other purposes; ask your health care provider or pharmacist if you have questions. COMMON BRAND NAME(S): Cherly Hensen, Neulasta, Nyvepria, Stimufend, UDENYCA, Ziextenzo What should I tell my care team before I take this medication? They need to know if you have any of these conditions: Kidney disease Latex allergy Ongoing radiation therapy Sickle cell disease Skin reactions to acrylic adhesives (On-Body Injector only) An unusual or  allergic reaction to pegfilgrastim, filgrastim, other medications, foods, dyes, or preservatives Pregnant or trying to get pregnant Breast-feeding How should I use this medication? This medication is for injection under the skin. If you get this medication at home, you will be taught how to prepare and give the pre-filled syringe or how to use the On-body Injector. Refer to the patient Instructions for Use for detailed instructions. Use exactly as directed. Tell your care team immediately if you suspect that the On-body Injector may not have performed as intended or if you suspect the use of the On-body Injector resulted in a missed or partial dose. It is important that you put your used needles and syringes in a special sharps container. Do not put them in a trash can. If you do not have a sharps container, call your pharmacist or care team to get one. Talk to your care team about the use of this medication in children. While this medication may be prescribed for selected conditions, precautions do apply. Overdosage: If you think you have taken too much of this medicine contact a poison control center or emergency room at once. NOTE: This medicine is only for you. Do not share this medicine with others. What if I miss a dose? It is important not to miss your dose. Call your care team if you miss your dose. If you miss a dose due to an On-body Injector failure or leakage, a new dose should be administered as soon as possible using a single prefilled syringe for manual use. What may interact with this medication? Interactions have not  been studied. This list may not describe all possible interactions. Give your health care provider a list of all the medicines, herbs, non-prescription drugs, or dietary supplements you use. Also tell them if you smoke, drink alcohol, or use illegal drugs. Some items may interact with your medicine. What should I watch for while using this medication? Your condition will  be monitored carefully while you are receiving this medication. You may need blood work done while you are taking this medication. Talk to your care team about your risk of cancer. You may be more at risk for certain types of cancer if you take this medication. If you are going to need a MRI, CT scan, or other procedure, tell your care team that you are using this medication (On-Body Injector only). What side effects may I notice from receiving this medication? Side effects that you should report to your care team as soon as possible: Allergic reactions--skin rash, itching, hives, swelling of the face, lips, tongue, or throat Capillary leak syndrome--stomach or muscle pain, unusual weakness or fatigue, feeling faint or lightheaded, decrease in the amount of urine, swelling of the ankles, hands, or feet, trouble breathing High white blood cell level--fever, fatigue, trouble breathing, night sweats, change in vision, weight loss Inflammation of the aorta--fever, fatigue, back, chest, or stomach pain, severe headache Kidney injury (glomerulonephritis)--decrease in the amount of urine, red or dark [place] urine, foamy or bubbly urine, swelling of the ankles, hands, or feet Shortness of breath or trouble breathing Spleen injury--pain in upper left stomach or shoulder Unusual bruising or bleeding Side effects that usually do not require medical attention (report to your care team if they continue or are bothersome): Bone pain Pain in the hands or feet This list may not describe all possible side effects. Call your doctor for medical advice about side effects. You may report side effects to FDA at 1-800-FDA-1088. Where should I keep my medication? Keep out of the reach of children. If you are using this medication at home, you will be instructed on how to store it. Throw away any unused medication after the expiration date on the label. NOTE: This sheet is a summary. It may not cover all possible  information. If you have questions about this medicine, talk to your doctor, pharmacist, or health care provider.  2023 Elsevier/Gold Standard (2021-08-02 00:00:00)        To help prevent nausea and vomiting after your treatment, we encourage you to take your nausea medication as directed.  BELOW ARE SYMPTOMS THAT SHOULD BE REPORTED IMMEDIATELY: *FEVER GREATER THAN 100.4 F (38 C) OR HIGHER *CHILLS OR SWEATING *NAUSEA AND VOMITING THAT IS NOT CONTROLLED WITH YOUR NAUSEA MEDICATION *UNUSUAL SHORTNESS OF BREATH *UNUSUAL BRUISING OR BLEEDING *URINARY PROBLEMS (pain or burning when urinating, or frequent urination) *BOWEL PROBLEMS (unusual diarrhea, constipation, pain near the anus) TENDERNESS IN MOUTH AND THROAT WITH OR WITHOUT PRESENCE OF ULCERS (sore throat, sores in mouth, or a toothache) UNUSUAL RASH, SWELLING OR PAIN  UNUSUAL VAGINAL DISCHARGE OR ITCHING   Items with * indicate a potential emergency and should be followed up as soon as possible or go to the Emergency Department if any problems should occur.  Please show the CHEMOTHERAPY ALERT CARD or IMMUNOTHERAPY ALERT CARD at check-in to the Emergency Department and triage nurse.  Should you have questions after your visit or need to cancel or reschedule your appointment, please [facility] [phone]  and follow the prompts.  Office hours are 8:00  a.m. to 4:30 p.m. Monday - Friday. Please note that voicemails left after 4:00 p.m. may not be returned until the following business day.  We are closed weekends and major holidays. You have access to a nurse at all times for urgent questions. Please call the main number to the clinic [phone] and follow the prompts.  For any non-urgent questions, you may also contact your provider using MyChart. We now offer e-Visits for anyone 61 and older to request care online for non-urgent symptoms. For details visit [email or web address].   Also download the  MyChart app! Go to the app store, search "MyChart", open the app, select [place], and log in with your MyChart username and password.

## 2023-04-02 ENCOUNTER — Ambulatory Visit: Payer: Medicare Other

## 2023-04-03 ENCOUNTER — Emergency Department (HOSPITAL_COMMUNITY)
Admission: EM | Admit: 2023-04-03 | Discharge: 2023-04-04 | Disposition: A | Discharge status: Home / Self Care | Payer: Medicare Other | Attending: Emergency Medicine | Admitting: Emergency Medicine

## 2023-04-03 DIAGNOSIS — R509 Fever, unspecified: Secondary | ICD-10-CM | POA: Insufficient documentation

## 2023-04-04 ENCOUNTER — Encounter (HOSPITAL_COMMUNITY): Payer: Self-pay

## 2023-04-04 ENCOUNTER — Emergency Department (HOSPITAL_COMMUNITY): Payer: Medicare Other

## 2023-04-04 ENCOUNTER — Other Ambulatory Visit: Payer: Self-pay

## 2023-04-04 LAB — URINALYSIS, ROUTINE W REFLEX MICROSCOPIC
Bacteria, UA: NONE SEEN
Bilirubin Urine: NEGATIVE
Glucose, UA: NEGATIVE mg/dL
Hgb urine dipstick: NEGATIVE
Ketones, ur: NEGATIVE mg/dL
Leukocytes,Ua: NEGATIVE
Nitrite: NEGATIVE
Protein, ur: 30 mg/dL — AB
Specific Gravity, Urine: 1.02 (ref 1.005–1.030)
pH: 5 (ref 5.0–8.0)

## 2023-04-04 LAB — CBC WITH DIFFERENTIAL/PLATELET
Abs Immature Granulocytes: 0 10*3/uL (ref 0.00–0.07)
Basophils Absolute: 0 10*3/uL (ref 0.0–0.1)
Basophils Relative: 0 %
Eosinophils Absolute: 0 10*3/uL (ref 0.0–0.5)
Eosinophils Relative: 0 %
HCT: 33 % — ABNORMAL LOW (ref 39.0–52.0)
Hemoglobin: 11.1 g/dL — ABNORMAL LOW (ref 13.0–17.0)
Lymphocytes Relative: 10 %
Lymphs Abs: 0.4 10*3/uL — ABNORMAL LOW (ref 0.7–4.0)
MCH: 29.9 pg (ref 26.0–34.0)
MCHC: 33.6 g/dL (ref 30.0–36.0)
MCV: 88.9 fL (ref 80.0–100.0)
Monocytes Absolute: 0.3 10*3/uL (ref 0.1–1.0)
Monocytes Relative: 7 %
Neutro Abs: 3.5 10*3/uL (ref 1.7–7.7)
Neutrophils Relative %: 83 %
Platelets: 89 10*3/uL — ABNORMAL LOW (ref 150–400)
RBC: 3.71 MIL/uL — ABNORMAL LOW (ref 4.22–5.81)
RDW: 15.3 % (ref 11.5–15.5)
WBC: 4.2 10*3/uL (ref 4.0–10.5)
nRBC: 0 % (ref 0.0–0.2)

## 2023-04-04 LAB — COMPREHENSIVE METABOLIC PANEL
ALT: 25 U/L (ref 0–44)
AST: 33 U/L (ref 15–41)
Albumin: 3.7 g/dL (ref 3.5–5.0)
Alkaline Phosphatase: 90 U/L (ref 38–126)
Anion gap: 8 (ref 5–15)
BUN: 23 mg/dL (ref 8–23)
CO2: 25 mmol/L (ref 22–32)
Calcium: 8.1 mg/dL — ABNORMAL LOW (ref 8.9–10.3)
Chloride: 100 mmol/L (ref 98–111)
Creatinine, Ser: 0.86 mg/dL (ref 0.61–1.24)
GFR, Estimated: >60 mL/min (ref >60)
Glucose, Bld: 120 mg/dL — ABNORMAL HIGH (ref 70–99)
Potassium: 3.7 mmol/L (ref 3.5–5.1)
Sodium: 133 mmol/L — ABNORMAL LOW (ref 135–145)
Total Bilirubin: 1.7 mg/dL — ABNORMAL HIGH (ref 0.3–1.2)
Total Protein: 5.6 g/dL — ABNORMAL LOW (ref 6.5–8.1)

## 2023-04-04 LAB — CULTURE, BLOOD (ROUTINE X 2)

## 2023-04-04 NOTE — ED Notes (Addendum)
Patient transported to X-ray 

## 2023-04-04 NOTE — ED Triage Notes (Addendum)
Pt presents with fever of 102 at home and was informed to go to ED if temp got over 100.3. pt is chemo pt and last chemo was Tuesday. Pt took tylenol around 6pm tonight.

## 2023-04-04 NOTE — ED Provider Notes (Signed)
[place] EMERGENCY DEPARTMENT AT [facility] Provider Note   CSN: [id number] Arrival date & time: 04/03/23  2343     History  Chief Complaint  Patient presents with   Fever    Travis Wood is a 72 y.o. male.  72 year old male who presents the ER today secondary to fever.  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  Last had a treatment on Monday and sounds like he had a neutrophil stimulating shot the next day.  Was doing well but felt flushed tonight so checked his temperature and was 102 so he presents here for further evaluation.  No cough, nausea, vomiting, diarrhea, constipation, rashes no chest pain, headache or other associated symptoms.  Took a Tylenol and his temperature improved prior to arrival here.   Fever      Home Medications Prior to Admission medications   Medication Sig Start Date End Date Taking? Authorizing Provider  allopurinol (ZYLOPRIM) 300 MG tablet Take 1 tablet (300 mg total) by mouth daily. 02/24/23   Doreatha Massed, MD  atorvastatin (LIPITOR) 40 MG tablet Take 1 tablet (40 mg total) by mouth daily. 11/12/22   Mechele Claude, MD  BENDAMUSTINE HCL IV Inject into the vein every 28 (twenty-eight) days. Days 1 and 2 every 28 days 03/02/23   [provider]  ferrous sulfate 325 (65 FE) MG tablet Take 325 mg by mouth daily with breakfast.    [provider]  lidocaine-prilocaine (EMLA) cream Apply to affected area once 02/24/23   Doreatha Massed, MD  Multiple Vitamin (MULTIVITAMIN WITH MINERALS) TABS tablet Take 1 tablet by mouth daily.    [provider]  OLIVE LEAF EXTRACT PO Take 1 tablet by mouth in the morning and at bedtime.    [provider]  ondansetron (ZOFRAN) 4 MG tablet Take 1 tablet (4 mg total) by mouth every 8 (eight) hours as needed for nausea or vomiting. 01/15/22   Daryll Drown, NP  OVER THE COUNTER MEDICATION Take 1 each by mouth 5 (five) times daily.  [name]    [provider]  prochlorperazine (COMPAZINE) 10 MG tablet Take 1 tablet (10 mg total) by mouth every 6 (six) hours as needed for nausea or vomiting. 02/24/23   Doreatha Massed, MD  riTUXimab (RITUXAN IV) Inject into the vein every 28 (twenty-eight) days. 03/02/23   [provider]  Vitamin D, Ergocalciferol, (DRISDOL) 1.25 MG (50000 UNIT) CAPS capsule Take 1 capsule (50,000 Units total) by mouth every 7 (seven) days. 11/17/22   Mechele Claude, MD      Allergies    Patient has no known allergies.    Review of Systems   Review of Systems  Constitutional:  Positive for fever.    Physical Exam Updated Vital Signs BP 111/67   Pulse 98   Temp 99.7 F (37.6 C)   Resp 20   Ht 6\' 3"  (1.905 m)   Wt 90.6 kg   SpO2 95%   BMI 24.97 kg/m  Physical Exam Vitals and nursing note reviewed.  Constitutional:      Appearance: He is well-developed.  HENT:     Head: Normocephalic and atraumatic.  Eyes:     Pupils: Pupils are equal, round, and reactive to light.  Cardiovascular:     Rate and Rhythm: Normal rate.  Pulmonary:     Effort: Pulmonary effort is normal. No respiratory distress.  Abdominal:     General: Abdomen is flat. There is no distension.  Musculoskeletal:        General: Normal range of motion.     Cervical back: Normal range of motion.  Skin:    General: Skin is warm and dry.  Neurological:     General: No focal deficit present.     Mental Status: He is alert.     ED Results / Procedures / Treatments   Labs (all labs ordered are listed, but only abnormal results are displayed) Labs Reviewed  CBC WITH DIFFERENTIAL/PLATELET - Abnormal; Notable for the following components:      Result Value   RBC 3.71 (*)    Hemoglobin 11.1 (*)    HCT 33.0 (*)    Platelets 89 (*)    Lymphs Abs 0.4 (*)    All other components within normal limits  COMPREHENSIVE METABOLIC PANEL - Abnormal; Notable for the following components:   Sodium 133 (*)     Glucose, Bld 120 (*)    Calcium 8.1 (*)    Total Protein 5.6 (*)    Total Bilirubin 1.7 (*)    All other components within normal limits  URINALYSIS, ROUTINE W REFLEX MICROSCOPIC - Abnormal; Notable for the following components:   Protein, ur 30 (*)    All other components within normal limits  CULTURE, BLOOD (ROUTINE X 2)  CULTURE, BLOOD (ROUTINE X 2)    EKG None  Radiology DG Chest 2 View  Result Date: 04/04/2023 CLINICAL DATA:  fever EXAM: CHEST - 2 VIEW COMPARISON:  Chest x-ray 11/12/2022, PET CT 01/08/23 FINDINGS: Right chest wall Port-A-Cath with tip overlying the expected region of the distal superior vena cava. The heart and mediastinal contours are within normal limits. Interval decrease in size of a right hilar mass. No focal consolidation. No pulmonary edema. No pleural effusion. No pneumothorax. No acute osseous abnormality. IMPRESSION: 1. No active cardiopulmonary disease. 2. Interval decrease in size of a right hilar mass. Electronically Signed   By: Tish Frederickson M.D.   On: 04/04/2023 00:39    Procedures Procedures    Medications Ordered in ED Medications - No data to display  ED Course/ Medical Decision Making/ A&P                             Medical Decision Making Amount and/or Complexity of Data Reviewed Labs: ordered. Radiology: ordered.     Patient overall appears well.  His x-ray is interpreted by myself without any obvious pneumonia.  COVID/flu/RSV were negative.  Urine did not show infection, cultures pending.  Blood cultures pending.  Patient's white count is adequate and no other obvious lab abnormalities requiring hospitalization or urgent oncology consultation.  I did send a message to his oncologist, Dr. Ellin Saba, and the chart so he can follow-up as he deems necessary.  Discussed with the patient that he could get very sick very quick however is too early to tell what might of caused the fever and he may not have another 1 at all.  Discussed  reasons to return to the emergency room versus following up with his oncologist.  Him and wife state understanding.   Final Clinical Impression(s) / ED Diagnoses Final diagnoses:  Fever, unspecified fever cause    Rx / DC Orders ED Discharge Orders     None         Shakevia Sarris, Barbara Cower, MD 04/04/23 [phone]

## 2023-04-07 LAB — CULTURE, BLOOD (ROUTINE X 2): Special Requests: ADEQUATE

## 2023-04-08 LAB — CULTURE, BLOOD (ROUTINE X 2)

## 2023-04-08 NOTE — ED Provider Notes (Signed)
One of the patient's blood cultures came back positive for gram-positive rods.  I discussed with ID, Dr. Elinor Parkinson, likely contaminant given it took 4 days and there was no actual growth but this was on Gram stain.  I called patient to see how he is doing and he feels fine, has not had fevers for 48+ hours, and so I think this is unlikely to represent bacteremia.  We discussed following up with his PCP.   Pricilla Loveless, MD 04/08/23 [phone]

## 2023-04-09 LAB — CULTURE, BLOOD (ROUTINE X 2)
Culture: NO GROWTH
Special Requests: ADEQUATE

## 2023-04-10 LAB — CULTURE, BLOOD (ROUTINE X 2)

## 2023-04-11 ENCOUNTER — Telehealth (HOSPITAL_BASED_OUTPATIENT_CLINIC_OR_DEPARTMENT_OTHER): Payer: Self-pay | Admitting: *Deleted

## 2023-04-11 NOTE — Progress Notes (Signed)
ED Antimicrobial Stewardship Positive Culture Follow Up   Travis Wood is an 72 y.o. male who presented to [facility] on 04/03/2023 with a chief complaint of  Chief Complaint  Patient presents with   Fever    Recent Results (from the past 720 hour(s))  Blood culture (routine x 2)     Status: None   Collection Time: 04/04/23 12:13 AM   Specimen: BLOOD  Result Value Ref Range Status   Specimen Description BLOOD BLOOD RIGHT ARM  Final   Special Requests Blood Culture adequate volume  Final   Culture   Final    NO GROWTH 5 DAYS Performed at [facility], [address], [place], [place] [address]    Report Status 04/09/2023 FINAL  Final  Blood culture (routine x 2)     Status: Abnormal   Collection Time: 04/04/23  1:01 AM   Specimen: Site Not Specified; Blood  Result Value Ref Range Status   Specimen Description   Final    SITE NOT SPECIFIED BOTTLES DRAWN AEROBIC AND ANAEROBIC Performed at [facility], [address], [place], [place] [address]    Special Requests   Final    Blood Culture adequate volume Performed at [facility], [address] [place], [place] [address]    Culture  Setup Time   Final    GRAM POSITIVE RODS Gram Stain Report Called to,Read Back By and Verified With: T TALBOTT RN [phone] K FORSYTH ANAEROBIC BOTTLE ONLY    Culture (A)  Final    PROPIONIBACTERIUM ACNES Standardized susceptibility testing for this organism is not available. Performed at [facility], [address] [place], [place] [address]    Report Status 04/10/2023 FINAL  Final    [x]  Positive blood culture finding. MD Pricilla Loveless previously discusses with MD Manandhar and determined likely contamination. They discussed findings and assessed symptoms at that time. At this time, culture now finalized with propionibacterium acnes (common skin flora) which further indicates contaminated findings. I called patient again this morning to ensure no symptoms since last contact. Patient  states no changes at this time. No further action is needed.  Delmar Landau, PharmD, BCPS 04/11/2023 10:08 AM ED Clinical Pharmacist -  [phone]

## 2023-04-11 NOTE — Telephone Encounter (Signed)
Post ED Visit - Positive Culture Follow-up  Culture report reviewed by antimicrobial stewardship pharmacist: [facility] []  Enzo Bi, Pharm.D. []  Celedonio Miyamoto, Pharm.D., BCPS AQ-ID []  Garvin Fila, Pharm.D., BCPS []  Georgina Pillion, Pharm.D., BCPS []  [place], [address].D., BCPS, AAHIVP []  Estella Husk, Pharm.D., BCPS, AAHIVP []  Lysle Pearl, PharmD, BCPS []  Phillips Climes, PharmD, BCPS []  Agapito Games, PharmD, BCPS []  Verlan Friends, PharmD []  Mervyn Gay, PharmD, BCPS [x]  Riccardo Dubin, PharmD  [facility] []  Len Childs, PharmD []  Greer Pickerel, PharmD []  Adalberto Cole, PharmD []  Perlie Gold, Rph []  Lonell Face) Jean Rosenthal, PharmD []  Earl Many, PharmD []  Junita Push, PharmD []  Dorna Leitz, PharmD []  Terrilee Files, PharmD []  Lynann Beaver, PharmD []  Keturah Barre, PharmD []  Loralee Pacas, PharmD []  Bernadene Person, PharmD   Positive blood culture See previous note from pharmacist. No further action needed  Patsey Berthold 04/11/2023, 2:37 PM

## 2023-04-20 ENCOUNTER — Telehealth: Payer: Self-pay | Admitting: Physician Assistant

## 2023-04-20 ENCOUNTER — Ambulatory Visit (HOSPITAL_COMMUNITY)
Admission: RE | Admit: 2023-04-20 | Discharge: 2023-04-20 | Disposition: A | Discharge status: Home / Self Care | Payer: Medicare Other | Source: Ambulatory Visit | Attending: Physician Assistant | Admitting: Physician Assistant

## 2023-04-20 ENCOUNTER — Encounter (HOSPITAL_COMMUNITY): Payer: Self-pay | Admitting: *Deleted

## 2023-04-20 ENCOUNTER — Inpatient Hospital Stay (HOSPITAL_COMMUNITY)
Admission: EM | Admit: 2023-04-20 | Discharge: 2023-04-22 | DRG: 809 | Disposition: A | Discharge status: Home / Self Care | Payer: Medicare Other | Attending: Internal Medicine | Admitting: Internal Medicine

## 2023-04-20 ENCOUNTER — Telehealth: Payer: Self-pay | Admitting: *Deleted

## 2023-04-20 ENCOUNTER — Inpatient Hospital Stay: Payer: Medicare Other

## 2023-04-20 ENCOUNTER — Other Ambulatory Visit: Payer: Self-pay | Admitting: Physician Assistant

## 2023-04-20 ENCOUNTER — Other Ambulatory Visit: Payer: Self-pay

## 2023-04-20 ENCOUNTER — Other Ambulatory Visit: Payer: Self-pay | Admitting: *Deleted

## 2023-04-20 DIAGNOSIS — D709 Neutropenia, unspecified: Secondary | ICD-10-CM | POA: Diagnosis not present

## 2023-04-20 DIAGNOSIS — Z856 Personal history of leukemia: Secondary | ICD-10-CM

## 2023-04-20 DIAGNOSIS — R5383 Other fatigue: Secondary | ICD-10-CM | POA: Diagnosis present

## 2023-04-20 DIAGNOSIS — E785 Hyperlipidemia, unspecified: Secondary | ICD-10-CM | POA: Diagnosis present

## 2023-04-20 DIAGNOSIS — R509 Fever, unspecified: Secondary | ICD-10-CM

## 2023-04-20 DIAGNOSIS — R5081 Fever presenting with conditions classified elsewhere: Secondary | ICD-10-CM | POA: Diagnosis not present

## 2023-04-20 DIAGNOSIS — D696 Thrombocytopenia, unspecified: Secondary | ICD-10-CM | POA: Diagnosis present

## 2023-04-20 DIAGNOSIS — Z79899 Other long term (current) drug therapy: Secondary | ICD-10-CM

## 2023-04-20 DIAGNOSIS — D649 Anemia, unspecified: Secondary | ICD-10-CM

## 2023-04-20 DIAGNOSIS — Z9842 Cataract extraction status, left eye: Secondary | ICD-10-CM

## 2023-04-20 DIAGNOSIS — R0989 Other specified symptoms and signs involving the circulatory and respiratory systems: Secondary | ICD-10-CM

## 2023-04-20 DIAGNOSIS — Z9841 Cataract extraction status, right eye: Secondary | ICD-10-CM

## 2023-04-20 DIAGNOSIS — Z800 Family history of malignant neoplasm of digestive organs: Secondary | ICD-10-CM

## 2023-04-20 DIAGNOSIS — D630 Anemia in neoplastic disease: Secondary | ICD-10-CM | POA: Diagnosis present

## 2023-04-20 DIAGNOSIS — Z8041 Family history of malignant neoplasm of ovary: Secondary | ICD-10-CM

## 2023-04-20 DIAGNOSIS — Z83719 Family history of colon polyps, unspecified: Secondary | ICD-10-CM

## 2023-04-20 DIAGNOSIS — C8310 Mantle cell lymphoma, unspecified site: Secondary | ICD-10-CM | POA: Diagnosis present

## 2023-04-20 DIAGNOSIS — Z961 Presence of intraocular lens: Secondary | ICD-10-CM | POA: Diagnosis present

## 2023-04-20 DIAGNOSIS — Z86010 Personal history of colonic polyps: Secondary | ICD-10-CM

## 2023-04-20 DIAGNOSIS — Z96651 Presence of right artificial knee joint: Secondary | ICD-10-CM | POA: Diagnosis present

## 2023-04-20 DIAGNOSIS — Z87891 Personal history of nicotine dependence: Secondary | ICD-10-CM

## 2023-04-20 DIAGNOSIS — J069 Acute upper respiratory infection, unspecified: Secondary | ICD-10-CM | POA: Diagnosis present

## 2023-04-20 DIAGNOSIS — Z8249 Family history of ischemic heart disease and other diseases of the circulatory system: Secondary | ICD-10-CM

## 2023-04-20 LAB — CBC WITH DIFFERENTIAL/PLATELET
Abs Immature Granulocytes: 0 10*3/uL (ref 0.00–0.07)
Basophils Absolute: 0 10*3/uL (ref 0.0–0.1)
Basophils Relative: 1 %
Eosinophils Absolute: 0 10*3/uL (ref 0.0–0.5)
Eosinophils Relative: 0 %
HCT: 30.7 % — ABNORMAL LOW (ref 39.0–52.0)
Hemoglobin: 10.2 g/dL — ABNORMAL LOW (ref 13.0–17.0)
Immature Granulocytes: 0 %
Lymphocytes Relative: 74 %
Lymphs Abs: 0.9 10*3/uL (ref 0.7–4.0)
MCH: 30.2 pg (ref 26.0–34.0)
MCHC: 33.2 g/dL (ref 30.0–36.0)
MCV: 90.8 fL (ref 80.0–100.0)
Monocytes Absolute: 0.2 10*3/uL (ref 0.1–1.0)
Monocytes Relative: 19 %
Neutro Abs: 0.1 10*3/uL — CL (ref 1.7–7.7)
Neutrophils Relative %: 6 %
Platelets: 118 10*3/uL — ABNORMAL LOW (ref 150–400)
RBC: 3.38 MIL/uL — ABNORMAL LOW (ref 4.22–5.81)
RDW: 15.7 % — ABNORMAL HIGH (ref 11.5–15.5)
WBC: 1.2 10*3/uL — CL (ref 4.0–10.5)
nRBC: 0 % (ref 0.0–0.2)

## 2023-04-20 LAB — COMPREHENSIVE METABOLIC PANEL
ALT: 19 U/L (ref 0–44)
AST: 16 U/L (ref 15–41)
Albumin: 3.9 g/dL (ref 3.5–5.0)
Alkaline Phosphatase: 104 U/L (ref 38–126)
Anion gap: 9 (ref 5–15)
BUN: 14 mg/dL (ref 8–23)
CO2: 27 mmol/L (ref 22–32)
Calcium: 8.6 mg/dL — ABNORMAL LOW (ref 8.9–10.3)
Chloride: 98 mmol/L (ref 98–111)
Creatinine, Ser: 0.77 mg/dL (ref 0.61–1.24)
GFR, Estimated: >60 mL/min (ref >60)
Glucose, Bld: 109 mg/dL — ABNORMAL HIGH (ref 70–99)
Potassium: 3.8 mmol/L (ref 3.5–5.1)
Sodium: 134 mmol/L — ABNORMAL LOW (ref 135–145)
Total Bilirubin: 1.2 mg/dL (ref 0.3–1.2)
Total Protein: 6.4 g/dL — ABNORMAL LOW (ref 6.5–8.1)

## 2023-04-20 LAB — URINALYSIS, ROUTINE W REFLEX MICROSCOPIC
Bilirubin Urine: NEGATIVE
Glucose, UA: NEGATIVE mg/dL
Hgb urine dipstick: NEGATIVE
Ketones, ur: NEGATIVE mg/dL
Leukocytes,Ua: NEGATIVE
Nitrite: NEGATIVE
Protein, ur: NEGATIVE mg/dL
Specific Gravity, Urine: 1.018 (ref 1.005–1.030)
pH: 5 (ref 5.0–8.0)

## 2023-04-20 LAB — RESP PANEL BY RT-PCR (FLU A&B, COVID) ARPGX2
Influenza A by PCR: NEGATIVE
Influenza B by PCR: NEGATIVE
SARS Coronavirus 2 by RT PCR: NEGATIVE

## 2023-04-20 LAB — PROCALCITONIN: Procalcitonin: <0.1 ng/mL

## 2023-04-20 LAB — MAGNESIUM: Magnesium: 2.2 mg/dL (ref 1.7–2.4)

## 2023-04-20 MED ORDER — VANCOMYCIN HCL 1250 MG/250ML IV SOLN
1250.0000 mg | Freq: Two times a day (BID) | INTRAVENOUS | Status: DC
  Administered 2023-04-21 – 2023-04-22 (×3): 1250 mg via INTRAVENOUS
  Filled 2023-04-20 (×2): qty 250

## 2023-04-20 MED ORDER — ATORVASTATIN CALCIUM 40 MG PO TABS
40.0000 mg | ORAL_TABLET | Freq: Every day | ORAL | Status: DC
  Administered 2023-04-21: 40 mg via ORAL
  Filled 2023-04-20: qty 1

## 2023-04-20 MED ORDER — SODIUM CHLORIDE 0.9 % IV SOLN
500.0000 mg | INTRAVENOUS | Status: DC
  Administered 2023-04-20 – 2023-04-21 (×2): 500 mg via INTRAVENOUS
  Filled 2023-04-20 (×2): qty 5

## 2023-04-20 MED ORDER — IPRATROPIUM-ALBUTEROL 0.5-2.5 (3) MG/3ML IN SOLN
3.0000 mL | Freq: Once | RESPIRATORY_TRACT | Status: AC
  Administered 2023-04-20: 3 mL via RESPIRATORY_TRACT
  Filled 2023-04-20: qty 3

## 2023-04-20 MED ORDER — IPRATROPIUM-ALBUTEROL 0.5-2.5 (3) MG/3ML IN SOLN
3.0000 mL | Freq: Four times a day (QID) | RESPIRATORY_TRACT | Status: AC
  Administered 2023-04-20 – 2023-04-21 (×2): 3 mL via RESPIRATORY_TRACT
  Filled 2023-04-20 (×2): qty 3

## 2023-04-20 MED ORDER — ALLOPURINOL 300 MG PO TABS
300.0000 mg | ORAL_TABLET | Freq: Every day | ORAL | Status: DC
  Administered 2023-04-21 – 2023-04-22 (×2): 300 mg via ORAL
  Filled 2023-04-20 (×2): qty 1

## 2023-04-20 MED ORDER — ACETAMINOPHEN 325 MG PO TABS: 650.0000 mg | ORAL_TABLET | Freq: Four times a day (QID) | ORAL | Status: DC | PRN

## 2023-04-20 MED ORDER — ATORVASTATIN CALCIUM 40 MG PO TABS
40.0000 mg | ORAL_TABLET | Freq: Every day | ORAL | Status: DC
  Administered 2023-04-20: 40 mg via ORAL
  Filled 2023-04-20: qty 1

## 2023-04-20 MED ORDER — ACETAMINOPHEN 650 MG RE SUPP: 650.0000 mg | Freq: Four times a day (QID) | RECTAL | Status: DC | PRN

## 2023-04-20 MED ORDER — ALBUTEROL SULFATE (2.5 MG/3ML) 0.083% IN NEBU: 2.5000 mg | INHALATION_SOLUTION | RESPIRATORY_TRACT | Status: DC | PRN

## 2023-04-20 MED ORDER — SODIUM CHLORIDE 0.9 % IV SOLN
2.0000 g | Freq: Once | INTRAVENOUS | Status: AC
  Administered 2023-04-20: 2 g via INTRAVENOUS
  Filled 2023-04-20: qty 12.5

## 2023-04-20 MED ORDER — FERROUS SULFATE 325 (65 FE) MG PO TABS
325.0000 mg | ORAL_TABLET | Freq: Every day | ORAL | Status: DC
  Administered 2023-04-21 – 2023-04-22 (×2): 325 mg via ORAL
  Filled 2023-04-20 (×2): qty 1

## 2023-04-20 MED ORDER — ONDANSETRON HCL 4 MG PO TABS: 4.0000 mg | ORAL_TABLET | Freq: Four times a day (QID) | ORAL | Status: DC | PRN

## 2023-04-20 MED ORDER — SODIUM CHLORIDE 0.9 % IV SOLN
2.0000 g | Freq: Three times a day (TID) | INTRAVENOUS | Status: DC
  Administered 2023-04-21 – 2023-04-22 (×5): 2 g via INTRAVENOUS
  Filled 2023-04-20 (×5): qty 12.5

## 2023-04-20 MED ORDER — ACETAMINOPHEN 500 MG PO TABS
1000.0000 mg | ORAL_TABLET | Freq: Once | ORAL | Status: AC
  Administered 2023-04-20: 1000 mg via ORAL
  Filled 2023-04-20: qty 2

## 2023-04-20 MED ORDER — GUAIFENESIN ER 600 MG PO TB12
600.0000 mg | ORAL_TABLET | Freq: Two times a day (BID) | ORAL | Status: DC
  Administered 2023-04-20 – 2023-04-22 (×4): 600 mg via ORAL
  Filled 2023-04-20 (×4): qty 1

## 2023-04-20 MED ORDER — VANCOMYCIN HCL 2000 MG/400ML IV SOLN
2000.0000 mg | Freq: Once | INTRAVENOUS | Status: AC
  Administered 2023-04-20: 2000 mg via INTRAVENOUS
  Filled 2023-04-20: qty 400

## 2023-04-20 MED ORDER — PHENOL 1.4 % MT LIQD
1.0000 | OROMUCOSAL | Status: DC | PRN
  Filled 2023-04-20: qty 177

## 2023-04-20 MED ORDER — ONDANSETRON HCL 4 MG/2ML IJ SOLN: 4.0000 mg | Freq: Four times a day (QID) | INTRAMUSCULAR | Status: DC | PRN

## 2023-04-20 NOTE — Assessment & Plan Note (Signed)
Continue lipitor 40 mg 

## 2023-04-20 NOTE — Assessment & Plan Note (Addendum)
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  -obs to med-surg -has URI symptoms and course breath sounds typical of viral URI.  CXR looks to be clear by my read, but official read pending -continue broad spectrum abx with cefepime, azithromycin and vanc and narrow as indicated -BC obtained today at oncology -UA and urine culture pending  -PCT pending  -covid/flu/rsv negative -check RVP  -continue breathing treatments -trend CBC

## 2023-04-20 NOTE — Telephone Encounter (Signed)
Patient called nurse triage line to report persistent fever. Patient placed for symptom management visit tomorrow, but workup ordered today included the following: CBC/D with critical neutropenia (ANC 0.1, WBC 1.2) CMP and magnesium unremarkable. COVID and influenza NEGATIVE. CXR obtained, pending radiology read Urine culture and blood culture pending Procalcitonin pending  I called and discussed with patient via telephone at 4:02 PM. Patient reports daily fever x 10 days, with Tmax today 100.4 F.  Associated symptoms include fatigue, chills, chest congestion, wheezing, cough productive of yellow sputum. Given his febrile neutropenia and symptoms of URI, patient advised to return to the hospital and present to ED for initiation of broad-spectrum IV antibiotics and hospital admission. Patient verbalizes understanding and agreement with the above. Report given to ED physician, Dr. Alvino Blood in anticipation of patient's arrival.  Carnella Guadalajara, PA-C 04/20/23 4:20 PM

## 2023-04-20 NOTE — Assessment & Plan Note (Addendum)
-   Cycle 1 of Bendamustine and rituximab started on 03/02/2023 (plan for every 28 days for 6 cycles) repeat PET after 3rd treatment.  -cycle 2: 03/29/29 -oncology to follow  -continue allopurinol for TLS prophylaxis

## 2023-04-20 NOTE — ED Triage Notes (Signed)
Pt sent here for abnormal WBC's, pt with wheezing and coughing, fever noted in triage. Pt is on chemo.

## 2023-04-20 NOTE — Progress Notes (Signed)
Patient reports persistent fever x 2 weeks.  He is scheduled for symptom management visit tomorrow.  We will check the following tests in anticipation of that visit: - CXR - Urinalysis + urine culture - Blood cultures - Respiratory viral panel to include COVID-19, influenza, and RSV - CBC/D, CMP, magnesium, lactic acid, procalcitonin

## 2023-04-20 NOTE — H&P (Addendum)
History and Physical    Patient: Travis Wood ZOX:[id number] DOB: 01/25/1951 DOA: 04/20/2023 DOS: the patient was seen and examined on 04/20/2023 PCP: Doreatha Massed, MD  Patient coming from:  oncology clinic   - lives with his girlfriend. Ambulates independently    Chief Complaint: fever x 10 days   HPI: Travis Wood is a 72 y.o. male with medical history significant of mantle cell lymphoma currently undergoing treatment, HLD who has had history of fever x 10 days up to 102.5 with URI type symptoms including fatigue, chills, chest congestion, cough with productive sputum. He will take tylenol and it does help bring it down. He denies any shortness of breath. He denies any shortness of breath. He was triaged by oncology today and had lab work done that showed critical neutropenia with an ANC of 0.1 and WBC of 1.2.  BC were also obtained. He was advised to come to ED to get admitted and broad spectrum abx.  He has overall been able to play   Denies any fever/chills, vision changes/headaches, chest pain or palpitations, shortness of breath,  abdominal pain, N/V/D, dysuria or leg swelling.    Does not smoke or drink alcohol. +THC.   ER Course:  vitals: temp: 100.5, bp: 113/64, HR: 99, RR: 16, oxygen: 96%RA Pertinent labs: wbc: 1.2, hgb: 10.2, platelets 118, ANC: 0.1,  CXR with no acute consolidation or infiltrate by my read, radiology read pending  In ED: started on vanc, cefepime and azithromycin.    Review of Systems: As mentioned in the history of present illness. All other systems reviewed and are negative. Past Medical History:  Diagnosis Date   Anemia    Cataract    CLL (chronic lymphocytic leukemia) (HCC)    Erectile dysfunction    History of bronchitis    History of colon polyps    Hyperlipemia    Osteoarthritis    Shortness of breath dyspnea    with exertion   Past Surgical History:  Procedure Laterality Date   APPENDECTOMY     BRONCHIAL NEEDLE ASPIRATION BIOPSY   01/28/2023   Procedure: BRONCHIAL NEEDLE ASPIRATION BIOPSIES;  Surgeon: Josephine Igo, DO;  Location: [facility];  Service: Cardiopulmonary;;   CATARACT EXTRACTION W/PHACO Left 10/15/2015   Procedure: CATARACT EXTRACTION PHACO AND INTRAOCULAR LENS PLACEMENT LEFT EYE;  Surgeon: Gemma Payor, MD;  Location: [facility];  Service: Ophthalmology;  Laterality: Left;  CDE:12.20   CATARACT EXTRACTION W/PHACO Right 11/12/2015   Procedure: CATARACT EXTRACTION PHACO AND INTRAOCULAR LENS PLACEMENT RIGHT EYE:  CDE:  8.99;  Surgeon: Gemma Payor, MD;  Location: [facility];  Service: Ophthalmology;  Laterality: Right;   IR IMAGING GUIDED PORT INSERTION  03/04/2023   KNEE SURGERY     Left x 2    MASS EXCISION N/A 09/12/2015   Procedure: EXCISION SOFT TISSUE NEOPLASM (6 CM), BACK;  Surgeon: Franky Macho, MD;  Location: [facility];  Service: General;  Laterality: N/A;   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   Procedure: TOTAL KNEE ARTHROPLASTY;  Surgeon: Durene Romans, MD;  Location: [facility];  Service: Orthopedics;  Laterality: Right;  70 mins   VIDEO BRONCHOSCOPY WITH ENDOBRONCHIAL ULTRASOUND Bilateral 01/28/2023   Procedure: VIDEO BRONCHOSCOPY WITH ENDOBRONCHIAL ULTRASOUND;  Surgeon: Josephine Igo, DO;  Location: [facility];  Service: Cardiopulmonary;  Laterality: Bilateral;   Social History:  reports that he quit smoking about 31 years ago. His smoking use included cigarettes and pipe. He has a 10.00 pack-year smoking history. He has never  used smokeless tobacco. He reports that he does not currently use alcohol after a past usage of about 1.0 standard drink of alcohol per week. He reports current drug use. Frequency: 1.00 time per week. Drug: Marijuana.  No Known Allergies  Family History  Problem Relation Age of Onset   Colon cancer Father        age 55    Colon polyps Father    Heart disease Father        Heart failure   Heart disease Mother    Cancer Sister        Uterine, and ovarian   Hypertension  Brother    Obesity Sister    Heart disease Maternal Grandmother    Esophageal cancer Neg Hx    Liver cancer Neg Hx    Pancreatic cancer Neg Hx    Rectal cancer Neg Hx    Stomach cancer Neg Hx     Prior to Admission medications   Medication Sig Start Date End Date Taking? Authorizing Provider  allopurinol (ZYLOPRIM) 300 MG tablet Take 1 tablet (300 mg total) by mouth daily. 02/24/23   Doreatha Massed, MD  atorvastatin (LIPITOR) 40 MG tablet Take 1 tablet (40 mg total) by mouth daily. 11/12/22   Mechele Claude, MD  BENDAMUSTINE HCL IV Inject into the vein every 28 (twenty-eight) days. Days 1 and 2 every 28 days 03/02/23   [provider]  ferrous sulfate 325 (65 FE) MG tablet Take 325 mg by mouth daily with breakfast.    [provider]  lidocaine-prilocaine (EMLA) cream Apply to affected area once 02/24/23   Doreatha Massed, MD  Multiple Vitamin (MULTIVITAMIN WITH MINERALS) TABS tablet Take 1 tablet by mouth daily.    [provider]  OLIVE LEAF EXTRACT PO Take 1 tablet by mouth in the morning and at bedtime.    [provider]  ondansetron (ZOFRAN) 4 MG tablet Take 1 tablet (4 mg total) by mouth every 8 (eight) hours as needed for nausea or vomiting. 01/15/22   Daryll Drown, NP  OVER THE COUNTER MEDICATION Take 1 each by mouth 5 (five) times daily. [name]    [provider]  prochlorperazine (COMPAZINE) 10 MG tablet Take 1 tablet (10 mg total) by mouth every 6 (six) hours as needed for nausea or vomiting. 02/24/23   Doreatha Massed, MD  riTUXimab (RITUXAN IV) Inject into the vein every 28 (twenty-eight) days. 03/02/23   [provider]  Vitamin D, Ergocalciferol, (DRISDOL) 1.25 MG (50000 UNIT) CAPS capsule Take 1 capsule (50,000 Units total) by mouth every 7 (seven) days. 11/17/22   Mechele Claude, MD    Physical Exam: Vitals:   04/20/23 1738 04/20/23 1740 04/20/23 1930 04/20/23 1933  BP:  113/64 106/66   Pulse:  99  (!) 101   Resp:  16 15   Temp:  (!) 100.5 F (38.1 C)    TempSrc:  Oral    SpO2:  96% 95% 95%  Weight: 88 kg     Height: 6\' 3"  (1.905 m)      General:  Appears calm and comfortable and is in NAD Eyes:  PERRL, EOMI, normal lids, iris ENT:  grossly normal hearing, lips & tongue, mmm; appropriate dentition Neck:  no LAD, masses or thyromegaly; no carotid bruits Cardiovascular:  RRR, no m/r/g. No LE edema.  Respiratory:   course breath sounds throughout lung fields. No wheezing or crackles. Normal respiratory effort. Abdomen:  soft, NT, ND, NABS Back:  normal alignment, no CVAT Skin:  no rash or induration seen on limited exam Musculoskeletal:  grossly normal tone BUE/BLE, good ROM, no bony abnormality Lower extremity:  No LE edema.  Limited foot exam with no ulcerations.  2+ distal pulses. Psychiatric:  grossly normal mood and affect, speech fluent and appropriate, AOx3 Neurologic:  CN 2-12 grossly intact, moves all extremities in coordinated fashion, sensation intact   Radiological Exams on Admission: Independently reviewed - see discussion in A/P where applicable  No results found.     Labs on Admission: I have personally reviewed the available labs and imaging studies at the time of the admission.  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  -obs to med-surg -has URI symptoms and course breath sounds typical of viral URI.  CXR looks to be clear by my read, but official read pending -continue broad spectrum abx with cefepime, azithromycin and vanc and narrow as indicated -BC  obtained today at oncology -UA and urine culture pending  -PCT pending  -covid/flu/rsv negative -check RVP  -continue breathing treatments -trend CBC   Hyperlipidemia Continue lipitor 40mg    Mantle cell lymphoma (HCC) - Cycle 1 of Bendamustine and rituximab started on 03/02/2023 (plan for every 28 days for 6 cycles) repeat PET after 3rd treatment.  -cycle 2: 03/29/29 -oncology to follow  -continue allopurinol for TLS prophylaxis   Normocytic anemia Continue daily iron  Trend     Advance Care Planning:   Code Status: Full Code   Consults: none   DVT Prophylaxis: TED hose/ambulation   Family Communication: none   Severity of Illness: The appropriate patient status for this patient is OBSERVATION. Observation status is judged to be reasonable and necessary in order to provide the required intensity of service to ensure the patient's safety. The patient's presenting symptoms, physical exam findings, and initial radiographic and laboratory data in the context of their medical condition is felt to place them at decreased risk for further clinical deterioration. Furthermore, it is anticipated that the patient will be medically stable for discharge from the hospital within 2 midnights of admission.   Author: Orland Mustard, MD 04/20/2023 7:55 PM  For on call review [email or web address].

## 2023-04-20 NOTE — ED Provider Notes (Signed)
[place] EMERGENCY DEPARTMENT AT [facility] Provider Note  CSN: [id number] Arrival date & time: 04/20/23 1706  Chief Complaint(s) Fever  HPI Travis Wood is a 72 y.o. male with history of mantle cell lymphoma currently undergoing chemotherapy presenting to the emergency department with fever.  Patient reports he has had intermittent fevers and chills for the past 10 days.  He went to his oncology appointment today where they drew labs.  Labs are notable for neutropenia.  He was advised to come to the emergency department for admission. he reports that he has had cough with some yellow phlegm.  No headaches or neck stiffness, no chest pain, no belly pain, no nausea vomiting or diarrhea, no confusion or weakness, no urinary symptoms, no rashes.   Past Medical History Past Medical History:  Diagnosis Date   Anemia    Cataract    CLL (chronic lymphocytic leukemia) (HCC)    Erectile dysfunction    History of bronchitis    History of colon polyps    Hyperlipemia    Osteoarthritis    Shortness of breath dyspnea    with exertion   Patient Active Problem List   Diagnosis Date Noted   Mantle cell lymphoma (HCC) 02/11/2023   Right lower lobe lung mass 01/26/2023   S/P right TKA 10/09/2020   Right knee OA 10/09/2020   Eustachian tube dysfunction 12/14/2015   Hyperlipidemia 06/13/2015   Home Medication(s) Prior to Admission medications   Medication Sig Start Date End Date Taking? Authorizing Provider  allopurinol (ZYLOPRIM) 300 MG tablet Take 1 tablet (300 mg total) by mouth daily. 02/24/23   Doreatha Massed, MD  atorvastatin (LIPITOR) 40 MG tablet Take 1 tablet (40 mg total) by mouth daily. 11/12/22   Mechele Claude, MD  BENDAMUSTINE HCL IV Inject into the vein every 28 (twenty-eight) days. Days 1 and 2 every 28 days 03/02/23   [provider]  ferrous sulfate 325 (65 FE) MG tablet Take 325 mg by mouth daily with breakfast.    [provider]   lidocaine-prilocaine (EMLA) cream Apply to affected area once 02/24/23   Doreatha Massed, MD  Multiple Vitamin (MULTIVITAMIN WITH MINERALS) TABS tablet Take 1 tablet by mouth daily.    [provider]  OLIVE LEAF EXTRACT PO Take 1 tablet by mouth in the morning and at bedtime.    [provider]  ondansetron (ZOFRAN) 4 MG tablet Take 1 tablet (4 mg total) by mouth every 8 (eight) hours as needed for nausea or vomiting. 01/15/22   Daryll Drown, NP  OVER THE COUNTER MEDICATION Take 1 each by mouth 5 (five) times daily. [name]    [provider]  prochlorperazine (COMPAZINE) 10 MG tablet Take 1 tablet (10 mg total) by mouth every 6 (six) hours as needed for nausea or vomiting. 02/24/23   Doreatha Massed, MD  riTUXimab (RITUXAN IV) Inject into the vein every 28 (twenty-eight) days. 03/02/23   [provider]  Vitamin D, Ergocalciferol, (DRISDOL) 1.25 MG (50000 UNIT) CAPS capsule Take 1 capsule (50,000 Units total) by mouth every 7 (seven) days. 11/17/22   Mechele Claude, MD  Past Surgical History Past Surgical History:  Procedure Laterality Date   APPENDECTOMY     BRONCHIAL NEEDLE ASPIRATION BIOPSY  01/28/2023   Procedure: BRONCHIAL NEEDLE ASPIRATION BIOPSIES;  Surgeon: Josephine Igo, DO;  Location: [facility];  Service: Cardiopulmonary;;   CATARACT EXTRACTION W/PHACO Left 10/15/2015   Procedure: CATARACT EXTRACTION PHACO AND INTRAOCULAR LENS PLACEMENT LEFT EYE;  Surgeon: Gemma Payor, MD;  Location: [facility];  Service: Ophthalmology;  Laterality: Left;  CDE:12.20   CATARACT EXTRACTION W/PHACO Right 11/12/2015   Procedure: CATARACT EXTRACTION PHACO AND INTRAOCULAR LENS PLACEMENT RIGHT EYE:  CDE:  8.99;  Surgeon: Gemma Payor, MD;  Location: [facility];  Service: Ophthalmology;  Laterality: Right;   IR IMAGING GUIDED PORT  INSERTION  03/04/2023   KNEE SURGERY     Left x 2    MASS EXCISION N/A 09/12/2015   Procedure: EXCISION SOFT TISSUE NEOPLASM (6 CM), BACK;  Surgeon: Franky Macho, MD;  Location: [facility];  Service: General;  Laterality: N/A;   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   Procedure: TOTAL KNEE ARTHROPLASTY;  Surgeon: Durene Romans, MD;  Location: [facility];  Service: Orthopedics;  Laterality: Right;  70 mins   VIDEO BRONCHOSCOPY WITH ENDOBRONCHIAL ULTRASOUND Bilateral 01/28/2023   Procedure: VIDEO BRONCHOSCOPY WITH ENDOBRONCHIAL ULTRASOUND;  Surgeon: Josephine Igo, DO;  Location: [facility];  Service: Cardiopulmonary;  Laterality: Bilateral;   Family History Family History  Problem Relation Age of Onset   Colon cancer Father        age 75    Colon polyps Father    Heart disease Father        Heart failure   Heart disease Mother    Cancer Sister        Uterine, and ovarian   Hypertension Brother    Obesity Sister    Heart disease Maternal Grandmother    Esophageal cancer Neg Hx    Liver cancer Neg Hx    Pancreatic cancer Neg Hx    Rectal cancer Neg Hx    Stomach cancer Neg Hx     Social History Social History   Tobacco Use   Smoking status: Former    Packs/day: 1.00    Years: 10.00    Additional pack years: 0.00    Total pack years: 10.00    Types: Cigarettes, Pipe    Quit date: 09/10/1991    Years since quitting: 31.6   Smokeless tobacco: Never   Tobacco comments:    Smoked a pipe occasionally for 3 years.  Vaping Use   Vaping Use: Never used  Substance Use Topics   Alcohol use: Not Currently    Alcohol/week: 1.0 standard drink of alcohol    Types: 1 Cans of beer per week   Drug use: Yes    Frequency: 1.0 times per week    Types: Marijuana   Allergies Patient has no known allergies.  Review of Systems Review of Systems  All other systems reviewed and are negative.   Physical Exam Vital Signs  I have reviewed the triage vital signs BP 113/64 (BP Location:  Right Arm)   Pulse 99   Temp (!) 100.5 F (38.1 C) (Oral)   Resp 16   Ht 6\' 3"  (1.905 m)   Wt 88 kg   SpO2 96%   BMI 24.25 kg/m  Physical Exam Vitals and nursing note reviewed.  Constitutional:      General: He is not in acute distress.    Appearance: Normal appearance.  HENT:  Mouth/Throat:     Mouth: Mucous membranes are moist.  Eyes:     Conjunctiva/sclera: Conjunctivae normal.  Cardiovascular:     Rate and Rhythm: Normal rate and regular rhythm.  Pulmonary:     Effort: Pulmonary effort is normal. No respiratory distress.     Breath sounds: Rhonchi (diffusely) present.  Abdominal:     General: Abdomen is flat.     Palpations: Abdomen is soft.     Tenderness: There is no abdominal tenderness.  Musculoskeletal:     Right lower leg: No edema.     Left lower leg: No edema.  Skin:    General: Skin is warm and dry.     Capillary Refill: Capillary refill takes less than 2 seconds.  Neurological:     Mental Status: He is alert and oriented to person, place, and time. Mental status is at baseline.  Psychiatric:        Mood and Affect: Mood normal.        Behavior: Behavior normal.     ED Results and Treatments Labs (all labs ordered are listed, but only abnormal results are displayed) Labs Reviewed  MRSA NEXT GEN BY PCR, NASAL  URINALYSIS, ROUTINE W REFLEX MICROSCOPIC                                                                                                                          Radiology No results found.  Pertinent labs & imaging results that were available during my care of the patient were reviewed by me and considered in my medical decision making (see MDM for details).  Medications Ordered in ED Medications  acetaminophen (TYLENOL) tablet 1,000 mg (has no administration in time range)  ceFEPIme (MAXIPIME) 2 g in sodium chloride 0.9 % 100 mL IVPB (has no administration in time range)  vancomycin (VANCOREADY) IVPB 2000 mg/400 mL (has no  administration in time range)  vancomycin (VANCOREADY) IVPB 1250 mg/250 mL (has no administration in time range)  ipratropium-albuterol (DUONEB) 0.5-2.5 (3) MG/3ML nebulizer solution 3 mL (has no administration in time range)  azithromycin (ZITHROMAX) 500 mg in sodium chloride 0.9 % 250 mL IVPB (has no administration in time range)                                                                                                                                     Procedures Procedures  (including  critical care time)  Medical Decision Making / ED Course   MDM:  72 year old male presenting to the emergency department with fever.  Patient ANC today severely neutropenic.  Given febrile neutropenia, patient will need admission.  Has already had laboratory testing drawn today including blood cultures so we will defer repeat blood work at this time.  Chest x-ray obtained earlier today without clear evidence of pneumonia although pending radiology read.  Will cover with cefepime and vancomycin per pharmacy recommendations.  Will admit to hospital.  Also give azithromycin to cover for atypical infection.      Additional history obtained: -External records from outside source obtained and reviewed including: Chart review including previous notes, labs, imaging, consultation notes including oncology notes   Lab Tests: -I ordered, reviewed, and interpreted labs.   The pertinent results include:   Labs Reviewed  MRSA NEXT GEN BY PCR, NASAL  URINALYSIS, ROUTINE W REFLEX MICROSCOPIC     Imaging Studies ordered: I ordered imaging studies including CXR On my interpretation imaging demonstrates no clear change from prior I independently visualized and interpreted imaging. I agree with the radiologist interpretation   Medicines ordered and prescription drug management: Meds ordered this encounter  Medications   acetaminophen (TYLENOL) tablet 1,000 mg   ceFEPIme (MAXIPIME) 2 g in sodium  chloride 0.9 % 100 mL IVPB   vancomycin (VANCOREADY) IVPB 2000 mg/400 mL    Order Specific Question:   Indication:    Answer:   Febrile Neutropenia   vancomycin (VANCOREADY) IVPB 1250 mg/250 mL    Order Specific Question:   Indication:    Answer:   Febrile Neutropenia   ipratropium-albuterol (DUONEB) 0.5-2.5 (3) MG/3ML nebulizer solution 3 mL   azithromycin (ZITHROMAX) 500 mg in sodium chloride 0.9 % 250 mL IVPB    Order Specific Question:   Antibiotic Indication:    Answer:   CAP    -I have reviewed the patients home medicines and have made adjustments as needed   Consultations Obtained: I requested consultation with the oncologist,  and discussed lab and imaging findings as well as pertinent plan - they recommend: admission   Cardiac Monitoring: The patient was maintained on a cardiac monitor.  I personally viewed and interpreted the cardiac monitored which showed an underlying rhythm of: NSR  Social Determinants of Health:  Diagnosis or treatment significantly limited by social determinants of health: former smoker   Reevaluation: After the interventions noted above, I reevaluated the patient and found that their symptoms have stayed the same  Co morbidities that complicate the patient evaluation  Past Medical History:  Diagnosis Date   Anemia    Cataract    CLL (chronic lymphocytic leukemia) (HCC)    Erectile dysfunction    History of bronchitis    History of colon polyps    Hyperlipemia    Osteoarthritis    Shortness of breath dyspnea    with exertion      Dispostion: Disposition decision including need for hospitalization was considered, and patient admitted to the hospital.    Final Clinical Impression(s) / ED Diagnoses Final diagnoses:  Febrile neutropenia (HCC)     This chart was dictated using voice recognition software.  Despite best efforts to proofread,  errors can occur which can change the documentation meaning.    Lonell Grandchild,  MD 04/20/23 [phone]

## 2023-04-20 NOTE — Progress Notes (Addendum)
Pharmacy Antibiotic Note  Travis Wood is a 72 y.o. male admitted on 04/20/2023 with  febrile neutropenia, WBC 1.2, ANC 0.1, and persistent fever x2 weeks .  Pharmacy has been consulted for Vancomycin/cefepime dosing.  Scr 0.77 mg/dL (rounded to 0.8mg /dL) Weight 88 kg Cultures pending  Plan: Will give vancomycin 2000mg  LD x1 followed by vancomycin 1250mg  q12hr (eAUC 446) Cefepime 2gm q8hr MRSA nares ordered Plan to obtain levels at steady state if therapy continued  Will monitor for acute changes in renal function and adjust as needed F/u cultures results and de-escalate as appropriate   Height: 6\' 3"  (190.5 cm) Weight: 88 kg (194 lb) IBW/kg (Calculated) : 84.5  Temp (24hrs), Avg:100.5 F (38.1 C), Min:100.5 F (38.1 C), Max:100.5 F (38.1 C)  Recent Labs  Lab 04/20/23 1357  WBC 1.2*  CREATININE 0.77    Estimated Creatinine Clearance: 101.2 mL/min (by C-G formula based on SCr of 0.77 mg/dL).    No Known Allergies  Antimicrobials this admission: Vancomycin 5/20 >> p Cefepime 5/20 >> p   Microbiology results: 5/20 BCx: IP 5/20 UCx: IP  5/20 MRSA PCR: IP  Thank you for allowing pharmacy to be a part of this patient's care.  Lewis Shock [name] 04/20/2023 5:53 PM

## 2023-04-20 NOTE — Telephone Encounter (Signed)
Went to the ER on the 4 th with URI, was not given any antibiotics at that time. Is still running fevers > 101.  Per Dr. Ellin Saba, patient put on Rebekah Pennington,PAC.  Patient and provider aware.

## 2023-04-20 NOTE — Progress Notes (Unsigned)
CRITICAL VALUE ALERT Critical value received:  WBC 1.2, ANC 0.1 Date of notification:  04-20-23 Time of notification: 1515 Critical value read back:  Yes.   Nurse who received alert:  C. Gabreil Yonkers RN MD notified time and response:  1516, Dr. Ellin Saba, no new orders.

## 2023-04-20 NOTE — Assessment & Plan Note (Signed)
Continue daily iron  Trend

## 2023-04-21 ENCOUNTER — Inpatient Hospital Stay: Payer: Medicare Other | Admitting: Physician Assistant

## 2023-04-21 DIAGNOSIS — D709 Neutropenia, unspecified: Secondary | ICD-10-CM | POA: Diagnosis present

## 2023-04-21 DIAGNOSIS — Z856 Personal history of leukemia: Secondary | ICD-10-CM | POA: Diagnosis not present

## 2023-04-21 DIAGNOSIS — Z8041 Family history of malignant neoplasm of ovary: Secondary | ICD-10-CM | POA: Diagnosis not present

## 2023-04-21 DIAGNOSIS — C8318 Mantle cell lymphoma, lymph nodes of multiple sites: Secondary | ICD-10-CM | POA: Diagnosis not present

## 2023-04-21 DIAGNOSIS — Z83719 Family history of colon polyps, unspecified: Secondary | ICD-10-CM | POA: Diagnosis not present

## 2023-04-21 DIAGNOSIS — Z79899 Other long term (current) drug therapy: Secondary | ICD-10-CM | POA: Diagnosis not present

## 2023-04-21 DIAGNOSIS — Z9841 Cataract extraction status, right eye: Secondary | ICD-10-CM | POA: Diagnosis not present

## 2023-04-21 DIAGNOSIS — D696 Thrombocytopenia, unspecified: Secondary | ICD-10-CM | POA: Diagnosis present

## 2023-04-21 DIAGNOSIS — Z8249 Family history of ischemic heart disease and other diseases of the circulatory system: Secondary | ICD-10-CM | POA: Diagnosis not present

## 2023-04-21 DIAGNOSIS — E785 Hyperlipidemia, unspecified: Secondary | ICD-10-CM | POA: Diagnosis present

## 2023-04-21 DIAGNOSIS — Z87891 Personal history of nicotine dependence: Secondary | ICD-10-CM | POA: Diagnosis not present

## 2023-04-21 DIAGNOSIS — E782 Mixed hyperlipidemia: Secondary | ICD-10-CM | POA: Diagnosis not present

## 2023-04-21 DIAGNOSIS — Z961 Presence of intraocular lens: Secondary | ICD-10-CM | POA: Diagnosis present

## 2023-04-21 DIAGNOSIS — C8310 Mantle cell lymphoma, unspecified site: Secondary | ICD-10-CM

## 2023-04-21 DIAGNOSIS — Z9842 Cataract extraction status, left eye: Secondary | ICD-10-CM | POA: Diagnosis not present

## 2023-04-21 DIAGNOSIS — Z96651 Presence of right artificial knee joint: Secondary | ICD-10-CM | POA: Diagnosis present

## 2023-04-21 DIAGNOSIS — E7849 Other hyperlipidemia: Secondary | ICD-10-CM

## 2023-04-21 DIAGNOSIS — D649 Anemia, unspecified: Secondary | ICD-10-CM | POA: Diagnosis not present

## 2023-04-21 DIAGNOSIS — J069 Acute upper respiratory infection, unspecified: Secondary | ICD-10-CM | POA: Diagnosis present

## 2023-04-21 DIAGNOSIS — D630 Anemia in neoplastic disease: Secondary | ICD-10-CM | POA: Diagnosis present

## 2023-04-21 DIAGNOSIS — R5081 Fever presenting with conditions classified elsewhere: Secondary | ICD-10-CM | POA: Diagnosis not present

## 2023-04-21 DIAGNOSIS — Z800 Family history of malignant neoplasm of digestive organs: Secondary | ICD-10-CM | POA: Diagnosis not present

## 2023-04-21 DIAGNOSIS — Z86010 Personal history of colonic polyps: Secondary | ICD-10-CM | POA: Diagnosis not present

## 2023-04-21 DIAGNOSIS — R5383 Other fatigue: Secondary | ICD-10-CM | POA: Diagnosis present

## 2023-04-21 LAB — EXPECTORATED SPUTUM ASSESSMENT W GRAM STAIN, RFLX TO RESP C

## 2023-04-21 LAB — URINE CULTURE: Culture: NO GROWTH

## 2023-04-21 LAB — CBC
HCT: 23.4 % — ABNORMAL LOW (ref 39.0–52.0)
Hemoglobin: 7.7 g/dL — ABNORMAL LOW (ref 13.0–17.0)
MCH: 29.3 pg (ref 26.0–34.0)
MCHC: 32.9 g/dL (ref 30.0–36.0)
MCV: 89 fL (ref 80.0–100.0)
Platelets: 91 10*3/uL — ABNORMAL LOW (ref 150–400)
RBC: 2.63 MIL/uL — ABNORMAL LOW (ref 4.22–5.81)
RDW: 15.6 % — ABNORMAL HIGH (ref 11.5–15.5)
WBC: 0.9 10*3/uL — CL (ref 4.0–10.5)
nRBC: 0 % (ref 0.0–0.2)

## 2023-04-21 LAB — BASIC METABOLIC PANEL
Anion gap: 6 (ref 5–15)
BUN: 15 mg/dL (ref 8–23)
CO2: 24 mmol/L (ref 22–32)
Calcium: 7.9 mg/dL — ABNORMAL LOW (ref 8.9–10.3)
Chloride: 103 mmol/L (ref 98–111)
Creatinine, Ser: 0.62 mg/dL (ref 0.61–1.24)
GFR, Estimated: >60 mL/min (ref >60)
Glucose, Bld: 127 mg/dL — ABNORMAL HIGH (ref 70–99)
Potassium: 3.6 mmol/L (ref 3.5–5.1)
Sodium: 133 mmol/L — ABNORMAL LOW (ref 135–145)

## 2023-04-21 LAB — CULTURE, BLOOD (ROUTINE X 2)
Culture: NO GROWTH
Special Requests: ADEQUATE

## 2023-04-21 LAB — MRSA NEXT GEN BY PCR, NASAL: MRSA by PCR Next Gen: NOT DETECTED

## 2023-04-21 MED ORDER — TBO-FILGRASTIM 480 MCG/0.8ML ~~LOC~~ SOSY
480.0000 ug | PREFILLED_SYRINGE | Freq: Once | SUBCUTANEOUS | Status: AC
  Administered 2023-04-21: 480 ug via SUBCUTANEOUS
  Filled 2023-04-21: qty 0.8

## 2023-04-21 NOTE — Progress Notes (Signed)
Date and time results received: 04/21/23 0603  Test: WBC Critical Value: 0.9  Name of Provider Notified: Adefeso  Orders Received? Or Actions Taken?: None at this time

## 2023-04-21 NOTE — Progress Notes (Addendum)
PROGRESS NOTE  TRINE ABELLA JWJ:[id number] DOB: August 24, 1951 DOA: 04/20/2023 PCP: Doreatha Massed, MD  Brief History:   72 y.o. male with medical history significant of mantle cell lymphoma currently undergoing treatment, HLD who has had history of fever x 10 days up to 102.5 with URI type symptoms including fatigue, chills, chest congestion, cough with productive sputum (mostly clear, occasionally yellow). He will take tylenol and it does help bring it down.   Patient denies fevers, chills, headache, chest pain,  nausea, vomiting, diarrhea, abdominal pain, dysuria, hematuria, hematochezia, and melena.  He has had some mild dyspnea on duration, but primarily feels generalized weakness.   The patient had routine blood work performed on 04/20/2023 which showed WBC count 1.2 with ANC of 72.  With his recurrent fevers, the patient was instructed to go to the emergency department for further evaluation and treatment. In the emergency department, the patient was febrile up to 100.5 F.  He has soft blood pressure with systolic blood pressure 100-110.  Oxygen saturation is 95% room air. COVID-19 PCR, influenza are negative.  UA was negative for pyuria.  Chest x-ray was negative for infiltrates.  Blood cultures were obtained, and patient was started on vancomycin, cefepime, and azithromycin.   Assessment/Plan:  Neutropenic fever (HCC) -presented with ANC 72 -start filgrastim -has URI symptoms and course breath sounds typical of viral URI.   -personally reviewed CXR--no infiltrates -continue cefepime, azithromycin and vanc and narrow as indicated -follow blood culture -UA --no pyuria -urine culture pending  -PCT <0.10 -covid/flu/rsv negative -check RVP  -start duonebs -daily CBC with diff   Hyperlipidemia Continue lipitor 40mg     Mantle cell lymphoma (HCC) - Cycle 1 of Bendamustine and rituximab started on 03/02/2023 (plan for every 28 days for 6 cycles) repeat PET after 3rd  treatment.  -cycle 2: 03/30/23 -oncology to follow  -continue allopurinol for TLS prophylaxis    Normocytic anemia Continue daily iron  Trend Hgb  Thrombocytopenia -chronic -monitor for signs of bleeding    Family Communication:   spouse updated at bedside 5/21  Consultants:  none  Code Status:  FULL  DVT Prophylaxis:  SCds   Procedures: As Listed in Progress Note Above  Antibiotics: Vanc 5/20>> Cefepime 5/20>> Azithro 5/20>>   Subjective:  Patient denies fevers, chills, headache, chest pain, dyspnea, nausea, vomiting, diarrhea, abdominal pain, dysuria, hematuria, hematochezia, and melena.  Objective: Vitals:   04/20/23 2006 04/21/23 0020 04/21/23 0210 04/21/23 0401  BP: 97/73 105/68  102/65  Pulse: 99 100  94  Resp: 19 18  19   Temp: 99.1 F (37.3 C) 99.5 F (37.5 C)  99.2 F (37.3 C)  TempSrc: Oral Oral    SpO2: 97% 98% 92% 95%  Weight: 87.5 kg     Height: 6\' 3"  (1.905 m)       Intake/Output Summary (Last 24 hours) at 04/21/2023 6213 Last data filed at 04/21/2023 0500 Gross per 24 hour  Intake 930 ml  Output --  Net 930 ml   Weight change:  Exam:  General:  Pt is alert, follows commands appropriately, not in acute distress HEENT: No icterus, No thrush, No neck mass, Gray/AT Cardiovascular: RRR, S1/S2, no rubs, no gallops Respiratory: bibasilar rales.  No wheeze Abdomen: Soft/+BS, non tender, non distended, no guarding Extremities: No edema, No lymphangitis, No petechiae, No rashes, no synovitis   Data Reviewed: I have personally reviewed following labs and imaging studies Basic Metabolic Panel: Recent Labs  Lab 04/20/23 1357 04/21/23 0444  NA 134* 133*  K 3.8 3.6  CL 98 103  CO2 27 24  GLUCOSE 109* 127*  BUN 14 15  CREATININE 0.77 0.62  CALCIUM 8.6* 7.9*  MG 2.2  --    Liver Function Tests: Recent Labs  Lab 04/20/23 1357  AST 16  ALT 19  ALKPHOS 104  BILITOT 1.2  PROT 6.4*  ALBUMIN 3.9   No results for input(s): "LIPASE",  "AMYLASE" in the last 168 hours. No results for input(s): "AMMONIA" in the last 168 hours. Coagulation Profile: No results for input(s): "INR", "PROTIME" in the last 168 hours. CBC: Recent Labs  Lab 04/20/23 1357 04/21/23 0444  WBC 1.2* 0.9*  NEUTROABS 0.1*  --   HGB 10.2* 7.7*  HCT 30.7* 23.4*  MCV 90.8 89.0  PLT 118* 91*   Cardiac Enzymes: No results for input(s): "CKTOTAL", "CKMB", "CKMBINDEX", "TROPONINI" in the last 168 hours. BNP: Invalid input(s): "POCBNP" CBG: No results for input(s): "GLUCAP" in the last 168 hours. HbA1C: No results for input(s): "HGBA1C" in the last 72 hours. Urine analysis:    Component Value Date/Time   COLORURINE AMBER (A) 04/20/2023 1748   APPEARANCEUR CLOUDY (A) 04/20/2023 1748   APPEARANCEUR Clear 12/22/2018 1210   LABSPEC 1.018 04/20/2023 1748   PHURINE 5.0 04/20/2023 1748   GLUCOSEU NEGATIVE 04/20/2023 1748   HGBUR NEGATIVE 04/20/2023 1748   BILIRUBINUR NEGATIVE 04/20/2023 1748   BILIRUBINUR Negative 12/22/2018 1210   KETONESUR NEGATIVE 04/20/2023 1748   PROTEINUR NEGATIVE 04/20/2023 1748   NITRITE NEGATIVE 04/20/2023 1748   LEUKOCYTESUR NEGATIVE 04/20/2023 1748   Sepsis Labs: @LABRCNTIP (procalcitonin:4,lacticidven:4) ) Recent Results (from the past 240 hour(s))  Culture, blood (routine x 2)     Status: None (Preliminary result)   Collection Time: 04/20/23  1:57 PM   Specimen: BLOOD  Result Value Ref Range Status   Specimen Description BLOOD RIGHT ASSIST CONTROL  Final   Special Requests   Final    BOTTLES DRAWN AEROBIC AND ANAEROBIC Blood Culture adequate volume   Culture   Final    NO GROWTH < 24 HOURS Performed at [facility], [address] [place], [place] [address]    Report Status PENDING  Incomplete  Culture, blood (Routine X 2) w Reflex to ID Panel     Status: None (Preliminary result)   Collection Time: 04/20/23  1:57 PM   Specimen: BLOOD  Result Value Ref Range Status   Specimen Description BLOOD LEFT ASSIST  CONTROL  Final   Special Requests   Final    BOTTLES DRAWN AEROBIC AND ANAEROBIC Blood Culture adequate volume   Culture   Final    NO GROWTH < 24 HOURS Performed at [facility], [address], [place], [place] [address]    Report Status PENDING  Incomplete  Resp Panel by RT-PCR (Flu A&B, Covid) Anterior Nasal Swab     Status: None   Collection Time: 04/20/23  2:14 PM   Specimen: Anterior Nasal Swab  Result Value Ref Range Status   SARS Coronavirus 2 by RT PCR NEGATIVE NEGATIVE Final    Comment: (NOTE) SARS-CoV-2 target nucleic acids are NOT DETECTED.  The SARS-CoV-2 RNA is generally detectable in upper respiratory specimens during the acute phase of infection. The lowest concentration of SARS-CoV-2 viral copies this assay can detect is 138 copies/mL. A negative result does not preclude SARS-Cov-2 infection and should not be used as the sole basis for treatment or other patient management decisions. A negative result  may occur with  improper specimen collection/handling, submission of specimen other than nasopharyngeal swab, presence of viral mutation(s) within the areas targeted by this assay, and inadequate number of viral copies(<138 copies/mL). A negative result must be combined with clinical observations, patient history, and epidemiological information. The expected result is Negative.  Fact Sheet for Patients:  BloggerCourse.com  Fact Sheet for Healthcare Providers:  SeriousBroker.it  This test is no t yet approved or cleared by the [place] FDA and  has been authorized for detection and/or diagnosis of SARS-CoV-2 by FDA under an Emergency Use Authorization (EUA). This EUA will remain  in effect (meaning this test can be used) for the duration of the COVID-19 declaration under Section 564(b)(1) of the Act, 21 U.S.C.section 360bbb-3(b)(1), unless the authorization is terminated  or revoked sooner.        Influenza A by PCR NEGATIVE NEGATIVE Final   Influenza B by PCR NEGATIVE NEGATIVE Final    Comment: (NOTE) The Xpert Xpress SARS-CoV-2/FLU/RSV plus assay is intended as an aid in the diagnosis of influenza from Nasopharyngeal swab specimens and should not be used as a sole basis for treatment. Nasal washings and aspirates are unacceptable for Xpert Xpress SARS-CoV-2/FLU/RSV testing.  Fact Sheet for Patients: BloggerCourse.com  Fact Sheet for Healthcare Providers: SeriousBroker.it  This test is not yet approved or cleared by the [place] FDA and has been authorized for detection and/or diagnosis of SARS-CoV-2 by FDA under an Emergency Use Authorization (EUA). This EUA will remain in effect (meaning this test can be used) for the duration of the COVID-19 declaration under Section 564(b)(1) of the Act, 21 U.S.C. section 360bbb-3(b)(1), unless the authorization is terminated or revoked.  Performed at [facility], [address], [place], [place] [address]   MRSA Next Gen by PCR, Nasal     Status: None   Collection Time: 04/20/23 10:15 PM   Specimen: Nasal Mucosa; Nasal Swab  Result Value Ref Range Status   MRSA by PCR Next Gen NOT DETECTED NOT DETECTED Final    Comment: (NOTE) The GeneXpert MRSA Assay (FDA approved for NASAL specimens only), is one component of a comprehensive MRSA colonization surveillance program. It is not intended to diagnose MRSA infection nor to guide or monitor treatment for MRSA infections. Test performance is not FDA approved in patients less than 52 years old. Performed at [facility], [address], [place], [place] [address]      Scheduled Meds:  allopurinol  300 mg Oral Daily   atorvastatin  40 mg Oral QHS   ferrous sulfate  325 mg Oral Q breakfast   guaiFENesin  600 mg Oral BID   Continuous Infusions:  azithromycin Stopped (04/20/23 1948)   ceFEPime (MAXIPIME) IV 2 g (04/21/23 0403)    vancomycin      Procedures/Studies: DG Chest 2 View  Result Date: 04/04/2023 CLINICAL DATA:  fever EXAM: CHEST - 2 VIEW COMPARISON:  Chest x-ray 11/12/2022, PET CT 01/08/23 FINDINGS: Right chest wall Port-A-Cath with tip overlying the expected region of the distal superior vena cava. The heart and mediastinal contours are within normal limits. Interval decrease in size of a right hilar mass. No focal consolidation. No pulmonary edema. No pleural effusion. No pneumothorax. No acute osseous abnormality. IMPRESSION: 1. No active cardiopulmonary disease. 2. Interval decrease in size of a right hilar mass. Electronically Signed   By: Tish Frederickson M.D.   On: 04/04/2023 00:39    Catarina Hartshorn, DO  [facility]  If 7PM-7AM, please contact  night-coverage [email or web address] Password [facility] 04/21/2023, 8:52 AM   LOS: 0 days

## 2023-04-21 NOTE — Hospital Course (Signed)
72 y.o. male with medical history significant of mantle cell lymphoma currently undergoing treatment, HLD who has had history of fever x 10 days up to 102.5 with URI type symptoms including fatigue, chills, chest congestion, cough with productive sputum (mostly clear, occasionally yellow). He will take tylenol and it does help bring it down.   Patient denies fevers, chills, headache, chest pain,  nausea, vomiting, diarrhea, abdominal pain, dysuria, hematuria, hematochezia, and melena.  He has had some mild dyspnea on duration, but primarily feels generalized weakness.   The patient had routine blood work performed on 04/20/2023 which showed WBC count 1.2 with ANC of 72.  With his recurrent fevers, the patient was instructed to go to the emergency department for further evaluation and treatment. In the emergency department, the patient was febrile up to 100.5 F.  He has soft blood pressure with systolic blood pressure 100-110.  Oxygen saturation is 95% room air. COVID-19 PCR, influenza are negative.  UA was negative for pyuria.  Chest x-ray was negative for infiltrates.  Blood cultures were obtained, and patient was started on vancomycin, cefepime, and azithromycin.

## 2023-04-21 NOTE — Progress Notes (Signed)
  Transition of Care [facility]) Screening Note   Patient Details  Name: Travis Wood Date of Birth: 1951-09-08   Transition of Care [facility]) CM/SW Contact:    Annice Needy, LCSW Phone Number: 04/21/2023, 2:06 PM    Transition of Care Department [facility]) has reviewed patient and no TOC needs have been identified at this time. We will continue to monitor patient advancement through interdisciplinary progression rounds. If new patient transition needs arise, please place a TOC consult.

## 2023-04-22 DIAGNOSIS — D709 Neutropenia, unspecified: Secondary | ICD-10-CM | POA: Diagnosis not present

## 2023-04-22 DIAGNOSIS — R5081 Fever presenting with conditions classified elsewhere: Secondary | ICD-10-CM

## 2023-04-22 DIAGNOSIS — D649 Anemia, unspecified: Secondary | ICD-10-CM | POA: Diagnosis not present

## 2023-04-22 DIAGNOSIS — C8318 Mantle cell lymphoma, lymph nodes of multiple sites: Secondary | ICD-10-CM

## 2023-04-22 DIAGNOSIS — E782 Mixed hyperlipidemia: Secondary | ICD-10-CM | POA: Diagnosis not present

## 2023-04-22 LAB — CBC WITH DIFFERENTIAL/PLATELET
Abs Immature Granulocytes: 0 10*3/uL (ref 0.00–0.07)
Band Neutrophils: 10 %
Basophils Absolute: 0 10*3/uL (ref 0.0–0.1)
Basophils Relative: 0 %
Eosinophils Absolute: 0 10*3/uL (ref 0.0–0.5)
Eosinophils Relative: 0 %
HCT: 25.7 % — ABNORMAL LOW (ref 39.0–52.0)
Hemoglobin: 8.2 g/dL — ABNORMAL LOW (ref 13.0–17.0)
Lymphocytes Relative: 21 %
Lymphs Abs: 0.5 10*3/uL — ABNORMAL LOW (ref 0.7–4.0)
MCH: 28.6 pg (ref 26.0–34.0)
MCHC: 31.9 g/dL (ref 30.0–36.0)
MCV: 89.5 fL (ref 80.0–100.0)
Monocytes Absolute: 0.2 10*3/uL (ref 0.1–1.0)
Monocytes Relative: 9 %
Neutro Abs: 1.8 10*3/uL (ref 1.7–7.7)
Neutrophils Relative %: 60 %
Platelets: 98 10*3/uL — ABNORMAL LOW (ref 150–400)
RBC: 2.87 MIL/uL — ABNORMAL LOW (ref 4.22–5.81)
RDW: 15.4 % (ref 11.5–15.5)
WBC: 2.5 10*3/uL — ABNORMAL LOW (ref 4.0–10.5)
nRBC: 0 % (ref 0.0–0.2)

## 2023-04-22 LAB — BASIC METABOLIC PANEL
Anion gap: 7 (ref 5–15)
BUN: 11 mg/dL (ref 8–23)
CO2: 26 mmol/L (ref 22–32)
Calcium: 8.1 mg/dL — ABNORMAL LOW (ref 8.9–10.3)
Chloride: 104 mmol/L (ref 98–111)
Creatinine, Ser: 0.57 mg/dL — ABNORMAL LOW (ref 0.61–1.24)
GFR, Estimated: >60 mL/min (ref >60)
Glucose, Bld: 97 mg/dL (ref 70–99)
Potassium: 3.8 mmol/L (ref 3.5–5.1)
Sodium: 137 mmol/L (ref 135–145)

## 2023-04-22 LAB — CULTURE, RESPIRATORY W GRAM STAIN

## 2023-04-22 LAB — MAGNESIUM: Magnesium: 1.8 mg/dL (ref 1.7–2.4)

## 2023-04-22 MED ORDER — LEVOFLOXACIN 750 MG PO TABS: 750.0000 mg | ORAL_TABLET | Freq: Every day | ORAL | 0 refills | Status: AC

## 2023-04-22 NOTE — Discharge Summary (Signed)
Physician Discharge Summary   Patient: Travis Wood MRN: [id number] DOB: 05-16-1951  Admit date:     04/20/2023  Discharge date: 04/22/23  Discharge Physician: Vassie Loll   PCP: Doreatha Massed, MD   Recommendations at discharge:  Repeat CBC with differential to follow hemoglobin trend/stability and absolute neutrophil count. Repeat basic metabolic panel to follow electrolytes and renal function.  Discharge Diagnoses: Principal Problem:   Neutropenic fever (HCC) Active Problems:   Hyperlipidemia   Mantle cell lymphoma (HCC)   Normocytic anemia   Febrile neutropenia [facility])  Hospital Course:  73 y.o. male with medical history significant of mantle cell lymphoma currently undergoing treatment, HLD who has had history of fever x 10 days up to 102.5 with URI type symptoms including fatigue, chills, chest congestion, cough with productive sputum (mostly clear, occasionally yellow). He will take tylenol and it does help bring it down.   Patient denies fevers, chills, headache, chest pain,  nausea, vomiting, diarrhea, abdominal pain, dysuria, hematuria, hematochezia, and melena.  He has had some mild dyspnea on duration, but primarily feels generalized weakness.   The patient had routine blood work performed on 04/20/2023 which showed WBC count 1.2 with ANC of 72.  With his recurrent fevers, the patient was instructed to go to the emergency department for further evaluation and treatment. In the emergency department, the patient was febrile up to 100.5 F.  He has soft blood pressure with systolic blood pressure 100-110.  Oxygen saturation is 95% room air. COVID-19 PCR, influenza are negative.  UA was negative for pyuria.  Chest x-ray was negative for infiltrates.  Blood cultures were obtained, and patient was started on vancomycin, cefepime, and azithromycin.  Assessment and Plan: Neutropenic fever (HCC) -presented with ANC 72 -started on filgrastim -has URI symptoms and course breath  sounds typical of URI (viral presumed).   -Reviewed CXR--no infiltrates appreciated -cultures now any growth at time of discharge -Afebrile for almost 48 hours prior to discharge -Given underlying ongoing chemotherapy and high risk for infection patient has been sent home with oral Levaquin 750 mg by mouth daily to complete 5 more days as an outpatient. -UA --no pyuria -urine culture without any growth or abnormalities at time of discharge present. -PCT <0.10 -covid/flu/rsv negative. -Continue outpatient follow-up with hematology/oncology service.   Hyperlipidemia -Continue lipitor 40mg   -Low-fat diet recommended.   Mantle cell lymphoma (HCC) - Cycle 1 of Bendamustine and rituximab started on 03/02/2023 (plan for every 28 days for 6 cycles) repeat PET after 3rd treatment.  -Last cycle (2): on 03/30/23 -oncology to follow  -continue allopurinol for TLS prophylaxis  -Patient advised to maintain adequate hydration.   Normocytic anemia -Continue daily iron  -Repeat CBC to follow hemoglobin trend/stability. -No overt bleeding appreciated.     Thrombocytopenia -chronic -monitor for signs of bleeding  Consultants: Oncology service curbside. Procedures performed: See below for x-ray report. Disposition: Home Diet recommendation: Regular diet/instructed to monitor fat ingestion.  DISCHARGE MEDICATION: Allergies as of 04/22/2023   No Known Allergies      Medication List     TAKE these medications    allopurinol 300 MG tablet Commonly known as: ZYLOPRIM Take 1 tablet (300 mg total) by mouth daily.   antiseptic oral rinse Liqd 15 mLs by Mouth Rinse route as needed for dry mouth.   atorvastatin 40 MG tablet Commonly known as: LIPITOR Take 1 tablet (40 mg total) by mouth daily.   BENDAMUSTINE HCL IV Inject into the vein every 28 (twenty-eight) days.  Days 1 and 2 every 28 days   ferrous sulfate 325 (65 FE) MG tablet Take 325 mg by mouth daily with breakfast.    levofloxacin 750 MG tablet Commonly known as: Levaquin Take 1 tablet (750 mg total) by mouth daily for 5 days.   lidocaine-prilocaine cream Commonly known as: EMLA Apply to affected area once What changed:  how much to take when to take this reasons to take this   multivitamin with minerals Tabs tablet Take 1 tablet by mouth daily.   OLIVE LEAF EXTRACT PO Take 1 tablet by mouth in the morning and at bedtime.   ondansetron 4 MG tablet Commonly known as: Zofran Take 1 tablet (4 mg total) by mouth every 8 (eight) hours as needed for nausea or vomiting.   OVER THE COUNTER MEDICATION Take 1 each by mouth 5 (five) times daily. [name]   prochlorperazine 10 MG tablet Commonly known as: COMPAZINE Take 1 tablet (10 mg total) by mouth every 6 (six) hours as needed for nausea or vomiting.   RITUXAN IV Inject into the vein every 28 (twenty-eight) days.   Vitamin D (Ergocalciferol) 1.25 MG (50000 UNIT) Caps capsule Commonly known as: DRISDOL Take 1 capsule (50,000 Units total) by mouth every 7 (seven) days.        Follow-up Information     Doreatha Massed, MD. Schedule an appointment as soon as possible for a visit in 10 day(s).   Specialty: Hematology Contact information: [address] [place] [address] [phone]                Discharge Exam: Ceasar Mons Weights   04/20/23 1738 04/20/23 2006  Weight: 88 kg 87.5 kg   General exam: Alert, awake, oriented x 3; afebrile, no chest pain, no nausea or vomiting.  In no acute distress.  Feeling ready to go home. Respiratory system: Good air movement bilaterally; no using accessory muscle.  Good saturation on room air. Cardiovascular system:RRR. No murmurs, rubs, gallops. Gastrointestinal system: Abdomen is nondistended, soft and nontender. No organomegaly or masses felt. Normal bowel sounds heard. Central nervous system: Alert and oriented. No focal neurological deficits. Extremities: No cyanosis or clubbing;  no edema. Skin: No petechiae. Psychiatry: Judgement and insight appear normal. Mood & affect appropriate.    Condition at discharge: Stable and improved.  The results of significant diagnostics from this hospitalization (including imaging, microbiology, ancillary and laboratory) are listed below for reference.   Imaging Studies: DG Chest 2 View  Result Date: 04/04/2023 CLINICAL DATA:  fever EXAM: CHEST - 2 VIEW COMPARISON:  Chest x-ray 11/12/2022, PET CT 01/08/23 FINDINGS: Right chest wall Port-A-Cath with tip overlying the expected region of the distal superior vena cava. The heart and mediastinal contours are within normal limits. Interval decrease in size of a right hilar mass. No focal consolidation. No pulmonary edema. No pleural effusion. No pneumothorax. No acute osseous abnormality. IMPRESSION: 1. No active cardiopulmonary disease. 2. Interval decrease in size of a right hilar mass. Electronically Signed   By: Tish Frederickson M.D.   On: 04/04/2023 00:39    Microbiology: Results for orders placed or performed during the hospital encounter of 04/20/23  MRSA Next Gen by PCR, Nasal     Status: None   Collection Time: 04/20/23 10:15 PM   Specimen: Nasal Mucosa; Nasal Swab  Result Value Ref Range Status   MRSA by PCR Next Gen NOT DETECTED NOT DETECTED Final    Comment: (NOTE) The GeneXpert MRSA Assay (FDA approved for  NASAL specimens only), is one component of a comprehensive MRSA colonization surveillance program. It is not intended to diagnose MRSA infection nor to guide or monitor treatment for MRSA infections. Test performance is not FDA approved in patients less than 76 years old. Performed at [facility], [address], [place], [place] [address]   Expectorated Sputum Assessment w Gram Stain, Rflx to Resp Cult     Status: None   Collection Time: 04/21/23  6:00 PM   Specimen: Expectorated Sputum  Result Value Ref Range Status   Specimen Description EXPECTORATED SPUTUM  Final    Special Requests NONE  Final   Sputum evaluation   Final    THIS SPECIMEN IS ACCEPTABLE FOR SPUTUM CULTURE PERFORMED AT [facility] Performed at [facility], [address] [place], [place] [address]    Report Status 04/21/2023 FINAL  Final  Culture, Respiratory w Gram Stain     Status: None (Preliminary result)   Collection Time: 04/21/23  6:00 PM  Result Value Ref Range Status   Specimen Description   Final    EXPECTORATED SPUTUM Performed at [facility], [address], [place], [place] [address]    Special Requests   Final    NONE Reflexed from [id number] Performed at [facility], [address], [place], [place] [address]    Gram Stain   Final    ABUNDANT SQUAMOUS EPITHELIAL CELLS PRESENT NO WBC SEEN MODERATE GRAM NEGATIVE RODS MODERATE GRAM POSITIVE COCCI Performed at [facility], [address] [place], [place] [address]    Culture PENDING  Incomplete   Report Status PENDING  Incomplete    Labs: CBC: Recent Labs  Lab 04/20/23 1357 04/21/23 0444 04/22/23 0457  WBC 1.2* 0.9* 2.5*  NEUTROABS 0.1*  --  1.8  HGB 10.2* 7.7* 8.2*  HCT 30.7* 23.4* 25.7*  MCV 90.8 89.0 89.5  PLT 118* 91* 98*   Basic Metabolic Panel: Recent Labs  Lab 04/20/23 1357 04/21/23 0444 04/22/23 0457  NA 134* 133* 137  K 3.8 3.6 3.8  CL 98 103 104  CO2 27 24 26   GLUCOSE 109* 127* 97  BUN 14 15 11   CREATININE 0.77 0.62 0.57*  CALCIUM 8.6* 7.9* 8.1*  MG 2.2  --  1.8   Liver Function Tests: Recent Labs  Lab 04/20/23 1357  AST 16  ALT 19  ALKPHOS 104  BILITOT 1.2  PROT 6.4*  ALBUMIN 3.9   CBG: No results for input(s): "GLUCAP" in the last 168 hours.  Discharge time spent: greater than 30 minutes.  Signed: Vassie Loll, MD [facility] 04/22/2023

## 2023-04-22 NOTE — Plan of Care (Signed)

## 2023-04-23 LAB — CULTURE, RESPIRATORY W GRAM STAIN

## 2023-04-23 LAB — CULTURE, BLOOD (ROUTINE X 2): Special Requests: ADEQUATE

## 2023-04-24 ENCOUNTER — Other Ambulatory Visit: Payer: Self-pay

## 2023-04-25 LAB — CULTURE, BLOOD (ROUTINE X 2): Culture: NO GROWTH

## 2023-04-27 NOTE — Progress Notes (Signed)
[facility] [address] [place] [address]    Clinic Day:  04/28/2023  Referring physician: Doreatha Massed, MD  Patient Care Team: Doreatha Massed, MD as PCP - General (Hematology) Doreatha Massed, MD as Medical Oncologist (Medical Oncology) Therese Sarah, RN as Oncology Nurse Navigator (Medical Oncology)   ASSESSMENT & PLAN:   Assessment: 1.  Chronic lymphocytic leukemia: - Flow cytometry (01/26/2019): Monoclonal B-cell population consistent with CLL. - CT chest (12/12/2022): Large right hilar mass/bulky adenopathy measuring 7.5 x 6.4 cm encasing the right mainstem bronchus and lobar branches.  Additional mediastinal adenopathy, axillary and lower cervical adenopathy. - CT [facility] (12/12/2022): Severe splenomegaly measuring 21.5 cm.  Prominent retroperitoneal lymph nodes, left retroperitoneal nodes measuring 1.6 x 1.0 cm.  Subcapsular hypodense lesion of the peripheral liver dome measuring 1.6 x 1.1 cm.  Additional subcentimeter hypodense lesions too small to characterize. - No B symptoms.  Patient plays golf 3-4 times per week.  No recurrent infections. - CLL FISH panel with 13 q. deletion - T p53 mutation negative - IGHV hyper mutation present. - PET scan (01/08/2023): Marked right hilar adenopathy with lymph node conglomerate/large lymph node measuring 5 cm with SUV of 12.3.  There is hypermetabolic adenopathy above and below diaphragm with splenomegaly. - Bronchoscopy (01/28/2023) and biopsy of the right hilar mass by Dr. Tonia Brooms - Pathology: CD5 positive B-cell lymphoproliferative disorder, cyclin D1 positive, favoring mantle cell lymphoma.  FISH for t(11:14) positive indicating mantle cell lymphoma. - Cycle 1 of Bendamustine and rituximab started on 03/02/2023   2.  Social/family history: - Lives at home with his girlfriend.  Independent of ADLs and IADLs. - He retired 8 years ago after working at [employer] in [place].  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  Quit smoking in 1992.  Smoked 1 pack/day for 10 years. - Sister had breast cancer.  Another sister had ovarian and stomach cancer.  Father had colon cancer.  Mother had CLL.  Paternal aunt had lung cancer.  Another paternal aunt had breast cancer.    Plan: 1.  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  He completed Levaquin.  No more fevers reported. - Reviewed labs today: Normal LFTs and creatinine.  CBC with mild leukopenia but ANC is normal. - Proceed with cycle 3 with Bendamustine at 70 mg/m. - RTC 4 weeks for follow-up with repeat PET scan.   2.  TLS prophylaxis: - Continue allopurinol 3 mg daily.    Orders Placed This Encounter  Procedures   NM PET Image Restag (PS) Skull Base To Thigh    Standing Status:   Future    Standing Expiration Date:   04/27/2024    Order Specific Question:   If indicated for the ordered procedure, I authorize the administration of a radiopharmaceutical per Radiology protocol    Answer:   Yes    Order Specific Question:   Preferred imaging location?    Answer:   [facility]      I,Katie Daubenspeck,acting as a Neurosurgeon for [employer], MD.,have documented all relevant documentation on the behalf of Doreatha Massed, MD,as directed by  Doreatha Massed, MD while in the presence of Doreatha Massed, MD.   I, Doreatha Massed MD, have reviewed the above documentation for accuracy and completeness, and I agree with the above.   Doreatha Massed, MD   5/28/20241:10 PM  CHIEF COMPLAINT:  Diagnosis: Generalized lymphadenopathy and CLL    Cancer Staging  Mantle cell lymphoma (HCC) Staging form: Hodgkin and Non-Hodgkin Lymphoma, AJCC 8th Edition - Clinical stage from 02/11/2023: Stage III (Mantle cell lymphoma) - Signed by Doreatha Massed, MD on 02/11/2023    Prior Therapy: none  Current Therapy:   Bendamustine and rituximab every 28 days for 6 cycles    HISTORY OF PRESENT ILLNESS:   Oncology History  Mantle cell lymphoma (HCC)  02/11/2023 Initial Diagnosis   Mantle cell lymphoma (HCC)   02/11/2023 Cancer Staging   Staging form: Hodgkin and Non-Hodgkin Lymphoma, AJCC 8th Edition - Clinical stage from 02/11/2023: Stage III (Mantle cell lymphoma) - Signed by Doreatha Massed, MD on 02/11/2023 Histopathologic type: Mantle cell lymphoma (Includes all variants: blastic, pleomorphic, small cell) Stage prefix: Initial diagnosis   03/02/2023 -  Chemotherapy   Patient is on Treatment Plan : NON-HODGKINS LYMPHOMA Rituximab D1 + Bendamustine D1,2 q28d x 6 cycles        INTERVAL HISTORY:   Travis Wood is a 72 y.o. male presenting to clinic today for follow up of Generalized lymphadenopathy and CLL. He was last seen by me on 03/30/23.  Since his last visit, he was seen in the ED on 04/03/23 for fever.  He was also admitted on 04/20/23 with neutropenic fever.  Today, he states that he is doing well overall. His appetite level is at 100%. His energy level is at 100%.  PAST MEDICAL HISTORY:   Past Medical History: Past Medical History:  Diagnosis Date   Anemia    Cataract    CLL (chronic lymphocytic leukemia) (HCC)    Erectile dysfunction    History of bronchitis    History of colon polyps    Hyperlipemia    Osteoarthritis    Shortness of breath dyspnea    with exertion    Surgical History: Past Surgical History:  Procedure Laterality Date   APPENDECTOMY     BRONCHIAL NEEDLE ASPIRATION BIOPSY  01/28/2023   Procedure: BRONCHIAL NEEDLE ASPIRATION BIOPSIES;  Surgeon: Josephine Igo, DO;  Location: [facility];  Service: Cardiopulmonary;;   CATARACT EXTRACTION W/PHACO Left 10/15/2015   Procedure: CATARACT EXTRACTION PHACO AND INTRAOCULAR LENS PLACEMENT LEFT EYE;  Surgeon: Gemma Payor, MD;  Location: [facility];  Service: Ophthalmology;  Laterality: Left;  CDE:12.20   CATARACT EXTRACTION  W/PHACO Right 11/12/2015   Procedure: CATARACT EXTRACTION PHACO AND INTRAOCULAR LENS PLACEMENT RIGHT EYE:  CDE:  8.99;  Surgeon: Gemma Payor, MD;  Location: [facility];  Service: Ophthalmology;  Laterality: Right;   IR IMAGING GUIDED PORT INSERTION  03/04/2023   KNEE SURGERY     Left x 2    MASS EXCISION N/A 09/12/2015   Procedure: EXCISION SOFT TISSUE NEOPLASM (6 CM), BACK;  Surgeon: Franky Macho, MD;  Location: [facility];  Service: General;  Laterality: N/A;   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   Procedure: TOTAL KNEE ARTHROPLASTY;  Surgeon: Durene Romans, MD;  Location: [facility];  Service: Orthopedics;  Laterality: Right;  70 mins   VIDEO BRONCHOSCOPY WITH ENDOBRONCHIAL ULTRASOUND Bilateral 01/28/2023   Procedure: VIDEO BRONCHOSCOPY WITH ENDOBRONCHIAL ULTRASOUND;  Surgeon: Josephine Igo, DO;  Location: [facility];  Service: Cardiopulmonary;  Laterality: Bilateral;    Social History: Social History   Socioeconomic History   Marital status: Media planner    Spouse name: Not on file   Number of children: 2   Years of education: 12 years   Highest education level: 12th grade  Occupational History  Occupation: RETIRED  Tobacco Use   Smoking status: Former    Packs/day: 1.00    Years: 10.00    Additional pack years: 0.00    Total pack years: 10.00    Types: Cigarettes, Pipe    Quit date: 09/10/1991    Years since quitting: 31.6   Smokeless tobacco: Never   Tobacco comments:    Smoked a pipe occasionally for 3 years.  Vaping Use   Vaping Use: Never used  Substance and Sexual Activity   Alcohol use: Not Currently    Alcohol/week: 1.0 standard drink of alcohol    Types: 1 Cans of beer per week   Drug use: Yes    Frequency: 1.0 times per week    Types: Marijuana   Sexual activity: Yes  Other Topics Concern   Not on file  Social History Narrative   2 sons who live close by   Lives with girlfriend   Social Determinants of Health   Financial Resource Strain: Low Risk   (11/06/2022)   Overall Financial Resource Strain (CARDIA)    Difficulty of Paying Living Expenses: Not hard at all  Food Insecurity: No Food Insecurity (04/20/2023)   Hunger Vital Sign    Worried About Running Out of Food in the Last Year: Never true    Ran Out of Food in the Last Year: Never true  Transportation Needs: No Transportation Needs (04/20/2023)   PRAPARE - Administrator, Civil Service (Medical): No    Lack of Transportation (Non-Medical): No  Physical Activity: Sufficiently Active (11/06/2022)   Exercise Vital Sign    Days of Exercise per Week: 7 days    Minutes of Exercise per Session: 30 min  Stress: No Stress Concern Present (11/06/2022)   [employer] of Occupational Health - Occupational Stress Questionnaire    Feeling of Stress : Not at all  Social Connections: Socially Integrated (11/06/2022)   Social Connection and Isolation Panel [NHANES]    Frequency of Communication with Friends and Family: More than three times a week    Frequency of Social Gatherings with Friends and Family: More than three times a week    Attends Religious Services: 1 to 4 times per year    Active Member of [employer] or Organizations: Yes    Attends Engineer, structural: More than 4 times per year    Marital Status: Living with partner  Intimate Partner Violence: Not At Risk (04/20/2023)   Humiliation, Afraid, Rape, and Kick questionnaire    Fear of Current or Ex-Partner: No    Emotionally Abused: No    Physically Abused: No    Sexually Abused: No    Family History: Family History  Problem Relation Age of Onset   Colon cancer Father        age 58    Colon polyps Father    Heart disease Father        Heart failure   Heart disease Mother    Cancer Sister        Uterine, and ovarian   Hypertension Brother    Obesity Sister    Heart disease Maternal Grandmother    Esophageal cancer Neg Hx    Liver cancer Neg Hx    Pancreatic cancer Neg Hx    Rectal cancer Neg Hx     Stomach cancer Neg Hx     Current Medications:  Current Outpatient Medications:    allopurinol (ZYLOPRIM) 300 MG tablet, Take 1 tablet (300 mg total)  by mouth daily., Disp: 30 tablet, Rfl: 3   antiseptic oral rinse (BIOTENE) LIQD, 15 mLs by Mouth Rinse route as needed for dry mouth., Disp: , Rfl:    atorvastatin (LIPITOR) 40 MG tablet, Take 1 tablet (40 mg total) by mouth daily., Disp: 90 tablet, Rfl: 3   BENDAMUSTINE HCL IV, Inject into the vein every 28 (twenty-eight) days. Days 1 and 2 every 28 days, Disp: , Rfl:    ferrous sulfate 325 (65 FE) MG tablet, Take 325 mg by mouth daily with breakfast., Disp: , Rfl:    Multiple Vitamin (MULTIVITAMIN WITH MINERALS) TABS tablet, Take 1 tablet by mouth daily., Disp: , Rfl:    OLIVE LEAF EXTRACT PO, Take 1 tablet by mouth in the morning and at bedtime., Disp: , Rfl:    OVER THE COUNTER MEDICATION, Take 1 each by mouth 5 (five) times daily. [name], Disp: , Rfl:    Vitamin D, Ergocalciferol, (DRISDOL) 1.25 MG (50000 UNIT) CAPS capsule, Take 1 capsule (50,000 Units total) by mouth every 7 (seven) days., Disp: 13 capsule, Rfl: 3   lidocaine-prilocaine (EMLA) cream, Apply to affected area once (Patient taking differently: 1 Application daily as needed (infusion site). Apply to affected area once), Disp: 30 g, Rfl: 3   ondansetron (ZOFRAN) 4 MG tablet, Take 1 tablet (4 mg total) by mouth every 8 (eight) hours as needed for nausea or vomiting., Disp: 20 tablet, Rfl: 0   prochlorperazine (COMPAZINE) 10 MG tablet, Take 1 tablet (10 mg total) by mouth every 6 (six) hours as needed for nausea or vomiting., Disp: 30 tablet, Rfl: 1   riTUXimab (RITUXAN IV), Inject into the vein every 28 (twenty-eight) days., Disp: , Rfl:  No current facility-administered medications for this visit.  Facility-Administered Medications Ordered in Other Visits:    bendamustine (BENDEKA) 152.5 mg in sodium chloride 0.9 % 50 mL (2.7184 mg/mL) chemo infusion, 70 mg/m2 (Treatment  Plan Recorded), Intravenous, Once, Doreatha Massed, MD   heparin lock flush 100 unit/mL, 500 Units, Intracatheter, Once PRN, Doreatha Massed, MD   sodium chloride flush (NS) 0.9 % injection 10 mL, 10 mL, Intracatheter, PRN, Doreatha Massed, MD   Allergies: No Known Allergies  REVIEW OF SYSTEMS:   Review of Systems  Constitutional:  Negative for chills, fatigue and fever.  HENT:   Negative for lump/mass, mouth sores, nosebleeds, sore throat and trouble swallowing.   Eyes:  Negative for eye problems.  Respiratory:  Negative for cough and shortness of breath.   Cardiovascular:  Negative for chest pain, leg swelling and palpitations.  Gastrointestinal:  Negative for abdominal pain, constipation, diarrhea, nausea and vomiting.  Genitourinary:  Negative for bladder incontinence, difficulty urinating, dysuria, frequency, hematuria and nocturia.   Musculoskeletal:  Negative for arthralgias, back pain, flank pain, myalgias and neck pain.  Skin:  Negative for itching and rash.  Neurological:  Negative for dizziness, headaches and numbness.  Hematological:  Does not bruise/bleed easily.  Psychiatric/Behavioral:  Negative for depression, sleep disturbance and suicidal ideas. The patient is not nervous/anxious.   All other systems reviewed and are negative.    VITALS:   There were no vitals taken for this visit.  Wt Readings from Last 3 Encounters:  04/28/23 198 lb 6.4 oz (90 kg)  04/20/23 192 lb 14.4 oz (87.5 kg)  04/04/23 199 lb 11.8 oz (90.6 kg)    There is no height or weight on file to calculate BMI.  Performance status (ECOG): 1 - Symptomatic but completely ambulatory  PHYSICAL EXAM:  Physical Exam Vitals and nursing note reviewed. Exam conducted with a chaperone present.  Constitutional:      Appearance: Normal appearance.  Cardiovascular:     Rate and Rhythm: Normal rate and regular rhythm.     Pulses: Normal pulses.     Heart sounds: Normal heart sounds.   Pulmonary:     Effort: Pulmonary effort is normal.     Breath sounds: Normal breath sounds.  Abdominal:     Palpations: Abdomen is soft. There is no hepatomegaly, splenomegaly or mass.     Tenderness: There is no abdominal tenderness.  Musculoskeletal:     Right lower leg: No edema.     Left lower leg: No edema.  Lymphadenopathy:     Cervical: No cervical adenopathy.     Right cervical: No superficial, deep or posterior cervical adenopathy.    Left cervical: No superficial, deep or posterior cervical adenopathy.     Upper Body:     Right upper body: No supraclavicular or axillary adenopathy.     Left upper body: No supraclavicular or axillary adenopathy.  Neurological:     General: No focal deficit present.     Mental Status: He is alert and oriented to person, place, and time.  Psychiatric:        Mood and Affect: Mood normal.        Behavior: Behavior normal.     LABS:      Latest Ref Rng & Units 04/28/2023    8:18 AM 04/22/2023    4:57 AM 04/21/2023    4:44 AM  CBC  WBC 4.0 - 10.5 K/uL 3.8  2.5  0.9   Hemoglobin 13.0 - 17.0 g/dL 96.0  8.2  7.7   Hematocrit 39.0 - 52.0 % 32.4  25.7  23.4   Platelets 150 - 400 K/uL 158  98  91       Latest Ref Rng & Units 04/28/2023    8:18 AM 04/22/2023    4:57 AM 04/21/2023    4:44 AM  CMP  Glucose 70 - 99 mg/dL 454  97  098   BUN 8 - 23 mg/dL 14  11  15    Creatinine 0.61 - 1.24 mg/dL 1.19  1.47  8.29   Sodium 135 - 145 mmol/L 139  137  133   Potassium 3.5 - 5.1 mmol/L 3.8  3.8  3.6   Chloride 98 - 111 mmol/L 102  104  103   CO2 22 - 32 mmol/L 24  26  24    Calcium 8.9 - 10.3 mg/dL 8.5  8.1  7.9   Total Protein 6.5 - 8.1 g/dL 5.6     Total Bilirubin 0.3 - 1.2 mg/dL 0.5     Alkaline Phos 38 - 126 U/L 85     AST 15 - 41 U/L 16     ALT 0 - 44 U/L 18        No results found for: "CEA1", "CEA" / No results found for: "CEA1", "CEA" Lab Results  Component Value Date   PSA1 0.6 06/06/2020   No results found for: "FAO130" No  results found for: "CAN125"  Lab Results  Component Value Date   TOTALPROTELP 6.0 01/26/2019   ALBUMINELP 3.9 01/26/2019   A1GS 0.2 01/26/2019   A2GS 0.5 01/26/2019   BETS 0.9 01/26/2019   GAMS 0.5 01/26/2019   MSPIKE Not Observed 01/26/2019   SPEI Comment 01/26/2019   Lab Results  Component Value Date   TIBC 310  08/21/2021   FERRITIN 421 (H) 08/21/2021   IRONPCTSAT 20 08/21/2021   Lab Results  Component Value Date   LDH 134 02/26/2023   LDH 140 12/29/2022   LDH 143 10/01/2020     STUDIES:   DG Chest 2 View  Result Date: 04/23/2023 CLINICAL DATA:  Fever and congested cough EXAM: CHEST - 2 VIEW COMPARISON:  04/04/2023 FINDINGS: Right chest wall Port-A-Cath tip in the mid SVC. Stable heart size. Similar to decreased size of the right hilar mass. No focal consolidation, pleural effusion, or pneumothorax. No displaced rib fractures. IMPRESSION: No active cardiopulmonary disease. Similar to decreased size of the right hilar mass. Electronically Signed   By: Minerva Fester M.D.   On: 04/23/2023 21:47   DG Chest 2 View  Result Date: 04/04/2023 CLINICAL DATA:  fever EXAM: CHEST - 2 VIEW COMPARISON:  Chest x-ray 11/12/2022, PET CT 01/08/23 FINDINGS: Right chest wall Port-A-Cath with tip overlying the expected region of the distal superior vena cava. The heart and mediastinal contours are within normal limits. Interval decrease in size of a right hilar mass. No focal consolidation. No pulmonary edema. No pleural effusion. No pneumothorax. No acute osseous abnormality. IMPRESSION: 1. No active cardiopulmonary disease. 2. Interval decrease in size of a right hilar mass. Electronically Signed   By: Tish Frederickson M.D.   On: 04/04/2023 00:39

## 2023-04-28 ENCOUNTER — Inpatient Hospital Stay: Payer: Medicare Other

## 2023-04-28 ENCOUNTER — Inpatient Hospital Stay (HOSPITAL_BASED_OUTPATIENT_CLINIC_OR_DEPARTMENT_OTHER): Payer: Medicare Other | Admitting: Hematology

## 2023-04-28 VITALS — BP 91/58 | HR 62 | Temp 98.0°F | Resp 16

## 2023-04-28 DIAGNOSIS — Z5112 Encounter for antineoplastic immunotherapy: Secondary | ICD-10-CM | POA: Diagnosis present

## 2023-04-28 DIAGNOSIS — Z5111 Encounter for antineoplastic chemotherapy: Secondary | ICD-10-CM | POA: Diagnosis present

## 2023-04-28 DIAGNOSIS — C8318 Mantle cell lymphoma, lymph nodes of multiple sites: Secondary | ICD-10-CM | POA: Diagnosis not present

## 2023-04-28 DIAGNOSIS — Z79620 Long term (current) use of immunosuppressive biologic: Secondary | ICD-10-CM | POA: Diagnosis not present

## 2023-04-28 DIAGNOSIS — Z79630 Long term (current) use of alkylating agent: Secondary | ICD-10-CM | POA: Diagnosis not present

## 2023-04-28 DIAGNOSIS — Z79899 Other long term (current) drug therapy: Secondary | ICD-10-CM | POA: Diagnosis not present

## 2023-04-28 DIAGNOSIS — R5081 Fever presenting with conditions classified elsewhere: Secondary | ICD-10-CM | POA: Diagnosis not present

## 2023-04-28 DIAGNOSIS — C8312 Mantle cell lymphoma, intrathoracic lymph nodes: Secondary | ICD-10-CM | POA: Diagnosis not present

## 2023-04-28 DIAGNOSIS — Z1152 Encounter for screening for COVID-19: Secondary | ICD-10-CM | POA: Diagnosis not present

## 2023-04-28 DIAGNOSIS — Z5189 Encounter for other specified aftercare: Secondary | ICD-10-CM | POA: Diagnosis not present

## 2023-04-28 DIAGNOSIS — C9110 Chronic lymphocytic leukemia of B-cell type not having achieved remission: Secondary | ICD-10-CM | POA: Diagnosis not present

## 2023-04-28 LAB — CBC WITH DIFFERENTIAL/PLATELET
Abs Immature Granulocytes: 0.05 10*3/uL (ref 0.00–0.07)
Basophils Absolute: 0 10*3/uL (ref 0.0–0.1)
Basophils Relative: 1 %
Eosinophils Absolute: 0 10*3/uL (ref 0.0–0.5)
Eosinophils Relative: 0 %
HCT: 32.4 % — ABNORMAL LOW (ref 39.0–52.0)
Hemoglobin: 10.3 g/dL — ABNORMAL LOW (ref 13.0–17.0)
Immature Granulocytes: 1 %
Lymphocytes Relative: 34 %
Lymphs Abs: 1.3 10*3/uL (ref 0.7–4.0)
MCH: 29 pg (ref 26.0–34.0)
MCHC: 31.8 g/dL (ref 30.0–36.0)
MCV: 91.3 fL (ref 80.0–100.0)
Monocytes Absolute: 0.3 10*3/uL (ref 0.1–1.0)
Monocytes Relative: 8 %
Neutro Abs: 2.1 10*3/uL (ref 1.7–7.7)
Neutrophils Relative %: 56 %
Platelets: 158 10*3/uL (ref 150–400)
RBC: 3.55 MIL/uL — ABNORMAL LOW (ref 4.22–5.81)
RDW: 17.6 % — ABNORMAL HIGH (ref 11.5–15.5)
WBC: 3.8 10*3/uL — ABNORMAL LOW (ref 4.0–10.5)
nRBC: 0 % (ref 0.0–0.2)

## 2023-04-28 LAB — MAGNESIUM: Magnesium: 2.1 mg/dL (ref 1.7–2.4)

## 2023-04-28 LAB — COMPREHENSIVE METABOLIC PANEL
ALT: 18 U/L (ref 0–44)
AST: 16 U/L (ref 15–41)
Albumin: 3.5 g/dL (ref 3.5–5.0)
Alkaline Phosphatase: 85 U/L (ref 38–126)
Anion gap: 13 (ref 5–15)
BUN: 14 mg/dL (ref 8–23)
CO2: 24 mmol/L (ref 22–32)
Calcium: 8.5 mg/dL — ABNORMAL LOW (ref 8.9–10.3)
Chloride: 102 mmol/L (ref 98–111)
Creatinine, Ser: 0.66 mg/dL (ref 0.61–1.24)
GFR, Estimated: >60 mL/min (ref >60)
Glucose, Bld: 120 mg/dL — ABNORMAL HIGH (ref 70–99)
Potassium: 3.8 mmol/L (ref 3.5–5.1)
Sodium: 139 mmol/L (ref 135–145)
Total Bilirubin: 0.5 mg/dL (ref 0.3–1.2)
Total Protein: 5.6 g/dL — ABNORMAL LOW (ref 6.5–8.1)

## 2023-04-28 MED ORDER — PALONOSETRON HCL INJECTION 0.25 MG/5ML
0.2500 mg | Freq: Once | INTRAVENOUS | Status: AC
  Administered 2023-04-28: 0.25 mg via INTRAVENOUS
  Filled 2023-04-28: qty 5

## 2023-04-28 MED ORDER — SODIUM CHLORIDE 0.9 % IV SOLN
10.0000 mg | Freq: Once | INTRAVENOUS | Status: AC
  Administered 2023-04-28: 10 mg via INTRAVENOUS
  Filled 2023-04-28: qty 10

## 2023-04-28 MED ORDER — SODIUM CHLORIDE 0.9 % IV SOLN
375.0000 mg/m2 | Freq: Once | INTRAVENOUS | Status: AC
  Administered 2023-04-28: 800 mg via INTRAVENOUS
  Filled 2023-04-28: qty 50

## 2023-04-28 MED ORDER — SODIUM CHLORIDE 0.9 % IV SOLN: Freq: Once | INTRAVENOUS | Status: AC

## 2023-04-28 MED ORDER — SODIUM CHLORIDE 0.9 % IV SOLN
70.0000 mg/m2 | Freq: Once | INTRAVENOUS | Status: AC
  Administered 2023-04-28: 152.5 mg via INTRAVENOUS
  Filled 2023-04-28: qty 6.1

## 2023-04-28 MED ORDER — FAMOTIDINE IN NACL 20-0.9 MG/50ML-% IV SOLN
20.0000 mg | Freq: Once | INTRAVENOUS | Status: AC
  Administered 2023-04-28: 20 mg via INTRAVENOUS
  Filled 2023-04-28: qty 50

## 2023-04-28 MED ORDER — SODIUM CHLORIDE 0.9% FLUSH
10.0000 mL | INTRAVENOUS | Status: DC | PRN
  Administered 2023-04-28: 10 mL

## 2023-04-28 MED ORDER — CETIRIZINE HCL 10 MG/ML IV SOLN
10.0000 mg | Freq: Once | INTRAVENOUS | Status: AC
  Administered 2023-04-28: 10 mg via INTRAVENOUS
  Filled 2023-04-28: qty 1

## 2023-04-28 MED ORDER — HEPARIN SOD (PORK) LOCK FLUSH 100 UNIT/ML IV SOLN
500.0000 [IU] | Freq: Once | INTRAVENOUS | Status: AC | PRN
  Administered 2023-04-28: 500 [IU]

## 2023-04-28 MED ORDER — ACETAMINOPHEN 325 MG PO TABS
650.0000 mg | ORAL_TABLET | Freq: Once | ORAL | Status: AC
  Administered 2023-04-28: 650 mg via ORAL
  Filled 2023-04-28: qty 2

## 2023-04-28 NOTE — Progress Notes (Signed)
Patient has been examined by Dr. Katragadda. Vital signs and labs have been reviewed by MD - ANC, Creatinine, LFTs, hemoglobin, and platelets are within treatment parameters per M.D. - pt may proceed with treatment.  Primary RN and pharmacy notified.  

## 2023-04-28 NOTE — Patient Instructions (Signed)
[facility] at [facility] Discharge Instructions   You were seen and examined today by Dr. Ellin Saba.  He reviewed the results of your lab work which are normal/stable.   We will proceed with your treatment today.   We will repeat a PET scan prior to your next visit.   Return as scheduled.    Thank you for choosing  Can[facility] Center at [facility] to provide your oncology and hematology care.  To afford each patient quality time with our provider, please arrive at least 15 minutes before your scheduled appointment time.   If you have a lab appointment with the Cancer Center please come in thru the Main Entrance and check in at the main information desk.  You need to re-schedule your appointment should you arrive 10 or more minutes late.  We strive to give you quality time with our providers, and arriving late affects you and other patients whose appointments are after yours.  Also, if you no show three or more times for appointments you may be dismissed from the clinic at the providers discretion.     Again, thank you for choosing [facility].  Our hope is that these requests will decrease the amount of time that you wait before being seen by our physicians.       _____________________________________________________________  Should you have questions after your visit to [facility], please contact our office at [phone] and follow the prompts.  Our office hours are 8:00 a.m. and 4:30 p.m. Monday - Friday.  Please note that voicemails left after 4:00 p.m. may not be returned until the following business day.  We are closed weekends and major holidays.  You do have access to a nurse 24-7, just call the main number to the clinic [phone] and do not press any options, hold on the line and a nurse will answer the phone.    For prescription refill requests, have your pharmacy contact our office and allow 72 hours.     Due to Covid, you will need to wear a mask upon entering the hospital. If you do not have a mask, a mask will be given to you at the Main Entrance upon arrival. For doctor visits, patients may have 1 support person age 63 or older with them. For treatment visits, patients can not have anyone with them due to social distancing guidelines and our immunocompromised population.

## 2023-04-28 NOTE — Progress Notes (Signed)
Patient presents today for treatment and follow up visit with Dr. Ellin Saba. Labs within parameters for treatment. Vital signs within parameters for treatment.   Message received from A. Dareen Piano RN / Dr.Katragadda to proceed with treatment.   Rituxan and Bendamustine given today per MD orders. Tolerated infusion without adverse affects. Vital signs stable. No complaints at this time. Discharged from clinic ambulatory in stable condition. Alert and oriented x 3. F/U with [facility] as scheduled.

## 2023-04-28 NOTE — Patient Instructions (Signed)
[facility]  Discharge Instructions: Thank you for choosing [facility] to provide your oncology and hematology care.  If you have a lab appointment with the Cancer Center - please note that after April 8th, 2024, all labs will be drawn in the cancer center.  You do not have to check in or register with the main entrance as you have in the past but will complete your check-in in the cancer center.  Wear comfortable clothing and clothing appropriate for easy access to any Portacath or PICC line.   We strive to give you quality time with your provider. You may need to reschedule your appointment if you arrive late (15 or more minutes).  Arriving late affects you and other patients whose appointments are after yours.  Also, if you miss three or more appointments without notifying the office, you may be dismissed from the clinic at the provider's discretion.      For prescription refill requests, have your pharmacy contact our office and allow 72 hours for refills to be completed.    Today you received the following chemotherapy and/or immunotherapy agents Rituxan and Bendamustine. Bendamustine Injection What is this medication? BENDAMUSTINE (BEN da MUS teen) treats leukemia and lymphoma. It works by slowing down the growth of cancer cells. This medicine may be used for other purposes; ask your health care provider or pharmacist if you have questions. COMMON BRAND NAME(S): Marilynne Halsted, VIVIMUSTA What should I tell my care team before I take this medication? They need to know if you have any of these conditions: Infection, especially a viral infection, such as chickenpox, cold sores, herpes Kidney disease Liver disease An unusual or allergic reaction to bendamustine, mannitol, other medications, foods, dyes, or preservatives Pregnant or trying to get pregnant Breast-feeding How should I use this medication? This medication is injected into a vein.  It is given by your care team in a hospital or clinic setting. Talk to your care team about the use of this medication in children. Special care may be needed. Overdosage: If you think you have taken too much of this medicine contact a poison control center or emergency room at once. NOTE: This medicine is only for you. Do not share this medicine with others. What if I miss a dose? Keep appointments for follow-up doses. It is important not to miss your dose. Call your care team if you are unable to keep an appointment. What may interact with this medication? Do not take this medication with any of the following: Clozapine This medication may also interact with the following: Atazanavir Cimetidine Ciprofloxacin Enoxacin Fluvoxamine Medications for seizures, such as carbamazepine, phenobarbital Mexiletine Rifampin Tacrine Thiabendazole Zileuton This list may not describe all possible interactions. Give your health care provider a list of all the medicines, herbs, non-prescription drugs, or dietary supplements you use. Also tell them if you smoke, drink alcohol, or use illegal drugs. Some items may interact with your medicine. What should I watch for while using this medication? Visit your care team for regular checks on your progress. This medication may make you feel generally unwell. This is not uncommon, as chemotherapy can affect healthy cells as well as cancer cells. Report any side effects. Continue your course of treatment even though you feel ill unless your care team tells you to stop. You may need blood work while taking this medication. This medication may increase your risk of getting an infection. Call your care team for advice if you  get a fever, chills, sore throat, or other symptoms of a cold or flu. Do not treat yourself. Try to avoid being around people who are sick. This medication may cause serious skin reactions. They can happen weeks to months after starting the  medication. Contact your care team right away if you notice fevers or flu-like symptoms with a rash. The rash may be red or purple and then turn into blisters or peeling of the skin. You may also notice a red rash with swelling of the face, lips, or lymph nodes in your neck or under your arms. In some patients, this medication may cause a serious brain infection that may cause death. If you have any problems seeing, thinking, speaking, walking, or standing, tell your care team right away. If you cannot reach your care team, urgently seek other source of medical care. This medication may increase your risk to bruise or bleed. Call your care team if you notice any unusual bleeding. Talk to your care team about your risk of cancer. You may be more at risk for certain types of cancer if you take this medication. Talk to your care team about your risk of skin cancer. You may be more at risk for skin cancer if you take this medication. Talk to your care team if you or your partner wish to become pregnant or think either of you might be pregnant. This medication can cause serious birth defects if taken during pregnancy or for up to 6 months after the last dose. A negative pregnancy test is required before starting this medication. A reliable form of contraception is recommended while taking this medication and for 6 months after the last dose. Talk to your care team about reliable forms of contraception. Wear a condom while taking this medication and for at least 3 months after the last dose. Do not breast-feed while taking this medication or for at least 1 week after the last dose. This medication may cause infertility. Talk to your care team if you are concerned about your fertility. What side effects may I notice from receiving this medication? Side effects that you should report to your care team as soon as possible: Allergic reactions--skin rash, itching, hives, swelling of the face, lips, tongue, or  throat Infection--fever, chills, cough, sore throat, wounds that don't heal, pain or trouble when passing urine, general feeling of discomfort or being unwell Infusion reactions--chest pain, shortness of breath or trouble breathing, feeling faint or lightheaded Liver injury--right upper belly pain, loss of appetite, nausea, light-colored stool, dark yellow or brown urine, yellowing skin or eyes, unusual weakness or fatigue Low red blood cell level--unusual weakness or fatigue, dizziness, headache, trouble breathing Painful swelling, warmth, or redness of the skin, blisters or sores at the infusion site Rash, fever, and swollen lymph nodes Redness, blistering, peeling, or loosening of the skin, including inside the mouth Tumor lysis syndrome (TLS)--nausea, vomiting, diarrhea, decrease in the amount of urine, dark urine, unusual weakness or fatigue, confusion, muscle pain or cramps, fast or irregular heartbeat, joint pain Unusual bruising or bleeding Side effects that usually do not require medical attention (report to your care team if they continue or are bothersome): Diarrhea Fatigue Headache Loss of appetite Nausea Vomiting This list may not describe all possible side effects. Call your doctor for medical advice about side effects. You may report side effects to FDA at 1-800-FDA-1088. Where should I keep my medication? This medication is given in a hospital or clinic. It will not be  stored at home. NOTE: This sheet is a summary. It may not cover all possible information. If you have questions about this medicine, talk to your doctor, pharmacist, or health care provider.  2024 Elsevier/Gold Standard (2022-03-11 00:00:00) Rituximab Injection What is this medication? RITUXIMAB (ri TUX i mab) treats leukemia and lymphoma. It works by blocking a protein that causes cancer cells to grow and multiply. This helps to slow or stop the spread of cancer cells. It may also be used to treat autoimmune  conditions, such as arthritis. It works by slowing down an overactive immune system. It is a monoclonal antibody. This medicine may be used for other purposes; ask your health care provider or pharmacist if you have questions. COMMON BRAND NAME(S): RIABNI, Rituxan, RUXIENCE, truxima What should I tell my care team before I take this medication? They need to know if you have any of these conditions: Chest pain Heart disease Immune system problems Infection, such as chickenpox, cold sores, hepatitis B, herpes Irregular heartbeat or rhythm Kidney disease Low blood counts, such as low white cells, platelets, red cells Lung disease Recent or upcoming vaccine An unusual or allergic reaction to rituximab, other medications, foods, dyes, or preservatives Pregnant or trying to get pregnant Breast-feeding How should I use this medication? This medication is injected into a vein. It is given by a care team in a hospital or clinic setting. A special MedGuide will be given to you before each treatment. Be sure to read this information carefully each time. Talk to your care team about the use of this medication in children. While this medication may be prescribed for children as young as 6 months for selected conditions, precautions do apply. Overdosage: If you think you have taken too much of this medicine contact a poison control center or emergency room at once. NOTE: This medicine is only for you. Do not share this medicine with others. What if I miss a dose? Keep appointments for follow-up doses. It is important not to miss your dose. Call your care team if you are unable to keep an appointment. What may interact with this medication? Do not take this medication with any of the following: Live vaccines This medication may also interact with the following: Cisplatin This list may not describe all possible interactions. Give your health care provider a list of all the medicines, herbs,  non-prescription drugs, or dietary supplements you use. Also tell them if you smoke, drink alcohol, or use illegal drugs. Some items may interact with your medicine. What should I watch for while using this medication? Your condition will be monitored carefully while you are receiving this medication. You may need blood work while taking this medication. This medication can cause serious infusion reactions. To reduce the risk your care team may give you other medications to take before receiving this one. Be sure to follow the directions from your care team. This medication may increase your risk of getting an infection. Call your care team for advice if you get a fever, chills, sore throat, or other symptoms of a cold or flu. Do not treat yourself. Try to avoid being around people who are sick. Call your care team if you are around anyone with measles, chickenpox, or if you develop sores or blisters that do not heal properly. Avoid taking medications that contain aspirin, acetaminophen, ibuprofen, naproxen, or ketoprofen unless instructed by your care team. These medications may hide a fever. This medication may cause serious skin reactions. They can happen weeks  to months after starting the medication. Contact your care team right away if you notice fevers or flu-like symptoms with a rash. The rash may be red or purple and then turn into blisters or peeling of the skin. You may also notice a red rash with swelling of the face, lips, or lymph nodes in your neck or under your arms. In some patients, this medication may cause a serious brain infection that may cause death. If you have any problems seeing, thinking, speaking, walking, or standing, tell your care team right away. If you cannot reach your care team, urgently seek another source of medical care. Talk to your care team if you may be pregnant. Serious birth defects can occur if you take this medication during pregnancy and for 12 months after the  last dose. You will need a negative pregnancy test before starting this medication. Contraception is recommended while taking this medication and for 12 months after the last dose. Your care team can help you find the option that works for you. Do not breastfeed while taking this medication and for at least 6 months after the last dose. What side effects may I notice from receiving this medication? Side effects that you should report to your care team as soon as possible: Allergic reactions or angioedema--skin rash, itching or hives, swelling of the face, eyes, lips, tongue, arms, or legs, trouble swallowing or breathing Bowel blockage--stomach cramping, unable to have a bowel movement or pass gas, loss of appetite, vomiting Dizziness, loss of balance or coordination, confusion or trouble speaking Heart attack--pain or tightness in the chest, shoulders, arms, or jaw, nausea, shortness of breath, cold or clammy skin, feeling faint or lightheaded Heart rhythm changes--fast or irregular heartbeat, dizziness, feeling faint or lightheaded, chest pain, trouble breathing Infection--fever, chills, cough, sore throat, wounds that don't heal, pain or trouble when passing urine, general feeling of discomfort or being unwell Infusion reactions--chest pain, shortness of breath or trouble breathing, feeling faint or lightheaded Kidney injury--decrease in the amount of urine, swelling of the ankles, hands, or feet Liver injury--right upper belly pain, loss of appetite, nausea, light-colored stool, dark yellow or brown urine, yellowing skin or eyes, unusual weakness or fatigue Redness, blistering, peeling, or loosening of the skin, including inside the mouth Stomach pain that is severe, does not go away, or gets worse Tumor lysis syndrome (TLS)--nausea, vomiting, diarrhea, decrease in the amount of urine, dark urine, unusual weakness or fatigue, confusion, muscle pain or cramps, fast or irregular heartbeat, joint  pain Side effects that usually do not require medical attention (report to your care team if they continue or are bothersome): Headache Joint pain Nausea Runny or stuffy nose Unusual weakness or fatigue This list may not describe all possible side effects. Call your doctor for medical advice about side effects. You may report side effects to FDA at 1-800-FDA-1088. Where should I keep my medication? This medication is given in a hospital or clinic. It will not be stored at home. NOTE: This sheet is a summary. It may not cover all possible information. If you have questions about this medicine, talk to your doctor, pharmacist, or health care provider.  2024 Elsevier/Gold Standard (2022-04-10 00:00:00)       To help prevent nausea and vomiting after your treatment, we encourage you to take your nausea medication as directed.  BELOW ARE SYMPTOMS THAT SHOULD BE REPORTED IMMEDIATELY: *FEVER GREATER THAN 100.4 F (38 C) OR HIGHER *CHILLS OR SWEATING *NAUSEA AND VOMITING THAT IS NOT  CONTROLLED WITH YOUR NAUSEA MEDICATION *UNUSUAL SHORTNESS OF BREATH *UNUSUAL BRUISING OR BLEEDING *URINARY PROBLEMS (pain or burning when urinating, or frequent urination) *BOWEL PROBLEMS (unusual diarrhea, constipation, pain near the anus) TENDERNESS IN MOUTH AND THROAT WITH OR WITHOUT PRESENCE OF ULCERS (sore throat, sores in mouth, or a toothache) UNUSUAL RASH, SWELLING OR PAIN  UNUSUAL VAGINAL DISCHARGE OR ITCHING   Items with * indicate a potential emergency and should be followed up as soon as possible or go to the Emergency Department if any problems should occur.  Please show the CHEMOTHERAPY ALERT CARD or IMMUNOTHERAPY ALERT CARD at check-in to the Emergency Department and triage nurse.  Should you have questions after your visit or need to cancel or reschedule your appointment, please contact [facility] [phone]  and follow the prompts.  Office hours are 8:00 a.m. to 4:30 p.m.  Monday - Friday. Please note that voicemails left after 4:00 p.m. may not be returned until the following business day.  We are closed weekends and major holidays. You have access to a nurse at all times for urgent questions. Please call the main number to the clinic [phone] and follow the prompts.  For any non-urgent questions, you may also contact your provider using MyChart. We now offer e-Visits for anyone 74 and older to request care online for non-urgent symptoms. For details visit [email or web address].   Also download the MyChart app! Go to the app store, search "MyChart", open the app, select [facility], and log in with your MyChart username and password.

## 2023-04-29 ENCOUNTER — Inpatient Hospital Stay: Payer: Medicare Other

## 2023-04-29 VITALS — BP 107/57 | HR 65 | Temp 97.6°F | Resp 20

## 2023-04-29 DIAGNOSIS — Z5112 Encounter for antineoplastic immunotherapy: Secondary | ICD-10-CM | POA: Diagnosis not present

## 2023-04-29 DIAGNOSIS — C8318 Mantle cell lymphoma, lymph nodes of multiple sites: Secondary | ICD-10-CM

## 2023-04-29 MED ORDER — SODIUM CHLORIDE 0.9 % IV SOLN: Freq: Once | INTRAVENOUS | Status: AC

## 2023-04-29 MED ORDER — HEPARIN SOD (PORK) LOCK FLUSH 100 UNIT/ML IV SOLN
500.0000 [IU] | Freq: Once | INTRAVENOUS | Status: AC | PRN
  Administered 2023-04-29: 500 [IU]

## 2023-04-29 MED ORDER — SODIUM CHLORIDE 0.9 % IV SOLN
70.0000 mg/m2 | Freq: Once | INTRAVENOUS | Status: AC
  Administered 2023-04-29: 152.5 mg via INTRAVENOUS
  Filled 2023-04-29: qty 6.1

## 2023-04-29 MED ORDER — SODIUM CHLORIDE 0.9 % IV SOLN
10.0000 mg | Freq: Once | INTRAVENOUS | Status: AC
  Administered 2023-04-29: 10 mg via INTRAVENOUS
  Filled 2023-04-29: qty 10

## 2023-04-29 MED ORDER — PEGFILGRASTIM 6 MG/0.6ML ~~LOC~~ PSKT
6.0000 mg | PREFILLED_SYRINGE | Freq: Once | SUBCUTANEOUS | Status: AC
  Administered 2023-04-29: 6 mg via SUBCUTANEOUS
  Filled 2023-04-29: qty 0.6

## 2023-04-29 MED ORDER — SODIUM CHLORIDE 0.9% FLUSH
10.0000 mL | INTRAVENOUS | Status: AC
  Administered 2023-04-29: 10 mL

## 2023-04-29 MED ORDER — SODIUM CHLORIDE 0.9% FLUSH
10.0000 mL | INTRAVENOUS | Status: DC | PRN
  Administered 2023-04-29: 10 mL

## 2023-04-29 NOTE — Progress Notes (Signed)
Patient presents today for Bendamustine D2 C3. Vital signs within parameters for treatment. Patient denies any side effects related to treatment. Patient denies any changes from yesterday. MAR reviewed and updated.    Treatment given today per MD orders. Tolerated infusion without adverse affects. Vital signs stable. No complaints at this time. Discharged from clinic ambulatory in stable condition. Alert and oriented x 3. F/U with [facility] as scheduled.

## 2023-04-29 NOTE — Patient Instructions (Signed)
[facility]  Discharge Instructions: Thank you for choosing [facility] to provide your oncology and hematology care.  If you have a lab appointment with the Cancer Center - please note that after April 8th, 2024, all labs will be drawn in the cancer center.  You do not have to check in or register with the main entrance as you have in the past but will complete your check-in in the cancer center.  Wear comfortable clothing and clothing appropriate for easy access to any Portacath or PICC line.   We strive to give you quality time with your provider. You may need to reschedule your appointment if you arrive late (15 or more minutes).  Arriving late affects you and other patients whose appointments are after yours.  Also, if you miss three or more appointments without notifying the office, you may be dismissed from the clinic at the provider's discretion.      For prescription refill requests, have your pharmacy contact our office and allow 72 hours for refills to be completed.    Today you received the following chemotherapy and/or immunotherapy agents Bendamustine.  Bendamustine Injection What is this medication? BENDAMUSTINE (BEN da MUS teen) treats leukemia and lymphoma. It works by slowing down the growth of cancer cells. This medicine may be used for other purposes; ask your health care provider or pharmacist if you have questions. COMMON BRAND NAME(S): Marilynne Halsted, VIVIMUSTA What should I tell my care team before I take this medication? They need to know if you have any of these conditions: Infection, especially a viral infection, such as chickenpox, cold sores, herpes Kidney disease Liver disease An unusual or allergic reaction to bendamustine, mannitol, other medications, foods, dyes, or preservatives Pregnant or trying to get pregnant Breast-feeding How should I use this medication? This medication is injected into a vein. It is  given by your care team in a hospital or clinic setting. Talk to your care team about the use of this medication in children. Special care may be needed. Overdosage: If you think you have taken too much of this medicine contact a poison control center or emergency room at once. NOTE: This medicine is only for you. Do not share this medicine with others. What if I miss a dose? Keep appointments for follow-up doses. It is important not to miss your dose. Call your care team if you are unable to keep an appointment. What may interact with this medication? Do not take this medication with any of the following: Clozapine This medication may also interact with the following: Atazanavir Cimetidine Ciprofloxacin Enoxacin Fluvoxamine Medications for seizures, such as carbamazepine, phenobarbital Mexiletine Rifampin Tacrine Thiabendazole Zileuton This list may not describe all possible interactions. Give your health care provider a list of all the medicines, herbs, non-prescription drugs, or dietary supplements you use. Also tell them if you smoke, drink alcohol, or use illegal drugs. Some items may interact with your medicine. What should I watch for while using this medication? Visit your care team for regular checks on your progress. This medication may make you feel generally unwell. This is not uncommon, as chemotherapy can affect healthy cells as well as cancer cells. Report any side effects. Continue your course of treatment even though you feel ill unless your care team tells you to stop. You may need blood work while taking this medication. This medication may increase your risk of getting an infection. Call your care team for advice if you get  a fever, chills, sore throat, or other symptoms of a cold or flu. Do not treat yourself. Try to avoid being around people who are sick. This medication may cause serious skin reactions. They can happen weeks to months after starting the medication.  Contact your care team right away if you notice fevers or flu-like symptoms with a rash. The rash may be red or purple and then turn into blisters or peeling of the skin. You may also notice a red rash with swelling of the face, lips, or lymph nodes in your neck or under your arms. In some patients, this medication may cause a serious brain infection that may cause death. If you have any problems seeing, thinking, speaking, walking, or standing, tell your care team right away. If you cannot reach your care team, urgently seek other source of medical care. This medication may increase your risk to bruise or bleed. Call your care team if you notice any unusual bleeding. Talk to your care team about your risk of cancer. You may be more at risk for certain types of cancer if you take this medication. Talk to your care team about your risk of skin cancer. You may be more at risk for skin cancer if you take this medication. Talk to your care team if you or your partner wish to become pregnant or think either of you might be pregnant. This medication can cause serious birth defects if taken during pregnancy or for up to 6 months after the last dose. A negative pregnancy test is required before starting this medication. A reliable form of contraception is recommended while taking this medication and for 6 months after the last dose. Talk to your care team about reliable forms of contraception. Wear a condom while taking this medication and for at least 3 months after the last dose. Do not breast-feed while taking this medication or for at least 1 week after the last dose. This medication may cause infertility. Talk to your care team if you are concerned about your fertility. What side effects may I notice from receiving this medication? Side effects that you should report to your care team as soon as possible: Allergic reactions--skin rash, itching, hives, swelling of the face, lips, tongue, or  throat Infection--fever, chills, cough, sore throat, wounds that don't heal, pain or trouble when passing urine, general feeling of discomfort or being unwell Infusion reactions--chest pain, shortness of breath or trouble breathing, feeling faint or lightheaded Liver injury--right upper belly pain, loss of appetite, nausea, light-colored stool, dark yellow or brown urine, yellowing skin or eyes, unusual weakness or fatigue Low red blood cell level--unusual weakness or fatigue, dizziness, headache, trouble breathing Painful swelling, warmth, or redness of the skin, blisters or sores at the infusion site Rash, fever, and swollen lymph nodes Redness, blistering, peeling, or loosening of the skin, including inside the mouth Tumor lysis syndrome (TLS)--nausea, vomiting, diarrhea, decrease in the amount of urine, dark urine, unusual weakness or fatigue, confusion, muscle pain or cramps, fast or irregular heartbeat, joint pain Unusual bruising or bleeding Side effects that usually do not require medical attention (report to your care team if they continue or are bothersome): Diarrhea Fatigue Headache Loss of appetite Nausea Vomiting This list may not describe all possible side effects. Call your doctor for medical advice about side effects. You may report side effects to FDA at 1-800-FDA-1088. Where should I keep my medication? This medication is given in a hospital or clinic. It will not be stored  at home. NOTE: This sheet is a summary. It may not cover all possible information. If you have questions about this medicine, talk to your doctor, pharmacist, or health care provider.  2024 Elsevier/Gold Standard (2022-03-11 00:00:00)       To help prevent nausea and vomiting after your treatment, we encourage you to take your nausea medication as directed.  BELOW ARE SYMPTOMS THAT SHOULD BE REPORTED IMMEDIATELY: *FEVER GREATER THAN 100.4 F (38 C) OR HIGHER *CHILLS OR SWEATING *NAUSEA AND VOMITING  THAT IS NOT CONTROLLED WITH YOUR NAUSEA MEDICATION *UNUSUAL SHORTNESS OF BREATH *UNUSUAL BRUISING OR BLEEDING *URINARY PROBLEMS (pain or burning when urinating, or frequent urination) *BOWEL PROBLEMS (unusual diarrhea, constipation, pain near the anus) TENDERNESS IN MOUTH AND THROAT WITH OR WITHOUT PRESENCE OF ULCERS (sore throat, sores in mouth, or a toothache) UNUSUAL RASH, SWELLING OR PAIN  UNUSUAL VAGINAL DISCHARGE OR ITCHING   Items with * indicate a potential emergency and should be followed up as soon as possible or go to the Emergency Department if any problems should occur.  Please show the CHEMOTHERAPY ALERT CARD or IMMUNOTHERAPY ALERT CARD at check-in to the Emergency Department and triage nurse.  Should you have questions after your visit or need to cancel or reschedule your appointment, please contact [facility] [phone]  and follow the prompts.  Office hours are 8:00 a.m. to 4:30 p.m. Monday - Friday. Please note that voicemails left after 4:00 p.m. may not be returned until the following business day.  We are closed weekends and major holidays. You have access to a nurse at all times for urgent questions. Please call the main number to the clinic [phone] and follow the prompts.  For any non-urgent questions, you may also contact your provider using MyChart. We now offer e-Visits for anyone 72 and older to request care online for non-urgent symptoms. For details visit [email or web address].   Also download the MyChart app! Go to the app store, search "MyChart", open the app, select [place], and log in with your MyChart username and password.

## 2023-05-01 ENCOUNTER — Inpatient Hospital Stay: Payer: Medicare Other

## 2023-05-21 ENCOUNTER — Encounter (HOSPITAL_COMMUNITY)
Admission: RE | Admit: 2023-05-21 | Discharge: 2023-05-21 | Disposition: A | Discharge status: Home / Self Care | Payer: Medicare Other | Source: Ambulatory Visit | Attending: Hematology | Admitting: Hematology

## 2023-05-21 DIAGNOSIS — C8318 Mantle cell lymphoma, lymph nodes of multiple sites: Secondary | ICD-10-CM | POA: Diagnosis not present

## 2023-05-21 MED ORDER — FLUDEOXYGLUCOSE F - 18 (FDG) INJECTION
9.0700 | Freq: Once | INTRAVENOUS | Status: AC | PRN
  Administered 2023-05-21: 9.07 via INTRAVENOUS

## 2023-05-22 NOTE — Progress Notes (Signed)
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  First dose will be on 05/26/23.  Orders updated to reflect change.  Pryor Ochoa, PharmD

## 2023-05-25 NOTE — Progress Notes (Signed)
[facility] [address] [place] [address]    Clinic Day:  05/26/2023  Referring physician: Doreatha Massed, MD  Patient Care Team: Doreatha Massed, MD as PCP - General (Hematology) Doreatha Massed, MD as Medical Oncologist (Medical Oncology) Therese Sarah, RN as Oncology Nurse Navigator (Medical Oncology)   ASSESSMENT & PLAN:   Assessment: 1.  Chronic lymphocytic leukemia: - Flow cytometry (01/26/2019): Monoclonal B-cell population consistent with CLL. - CT chest (12/12/2022): Large right hilar mass/bulky adenopathy measuring 7.5 x 6.4 cm encasing the right mainstem bronchus and lobar branches.  Additional mediastinal adenopathy, axillary and lower cervical adenopathy. - CT [facility] (12/12/2022): Severe splenomegaly measuring 21.5 cm.  Prominent retroperitoneal lymph nodes, left retroperitoneal nodes measuring 1.6 x 1.0 cm.  Subcapsular hypodense lesion of the peripheral liver dome measuring 1.6 x 1.1 cm.  Additional subcentimeter hypodense lesions too small to characterize. - No B symptoms.  Patient plays golf 3-4 times per week.  No recurrent infections. - CLL FISH panel with 13 q. deletion - T p53 mutation negative - IGHV hyper mutation present. - PET scan (01/08/2023): Marked right hilar adenopathy with lymph node conglomerate/large lymph node measuring 5 cm with SUV of 12.3.  There is hypermetabolic adenopathy above and below diaphragm with splenomegaly. - Bronchoscopy (01/28/2023) and biopsy of the right hilar mass by Dr. Tonia Brooms - Pathology: CD5 positive B-cell lymphoproliferative disorder, cyclin D1 positive, favoring mantle cell lymphoma.  FISH for t(11:14) positive indicating mantle cell lymphoma. - Cycle 1 of Bendamustine and rituximab started on 03/02/2023   2.  Social/family history: - Lives at home with his girlfriend.  Independent of ADLs and IADLs. - He retired 8 years ago after working at [employer] in [place].  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  Quit smoking in 1992.  Smoked 1 pack/day for 10 years. - Sister had breast cancer.  Another sister had ovarian and stomach cancer.  Father had colon cancer.  Mother had CLL.  Paternal aunt had lung cancer.  Another paternal aunt had breast cancer.    Plan: 1.  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  He tolerated very well. - Reviewed PET scan from 05/21/2023: Complete response to therapy with no residual hypermetabolic adenopathy.  Splenomegaly is stable. - Labs today: Normal LFTs and creatinine.  CBC shows mild leukopenia with white count 2.9 and mild thrombocytopenia 125. - Proceed with cycle 4 today with Bendamustine at 70 mg/m.  RTC 4 weeks for follow-up.   2.  TLS prophylaxis: - Continue allopurinol 300 mg daily and stop once the bottle is finished.    No orders of the defined types were placed in this encounter.     I,Katie Daubenspeck,acting as a Neurosurgeon for Doreatha Massed, MD.,have documented all relevant documentation on the behalf of Doreatha Massed, MD,as directed by  Doreatha Massed, MD while in the presence of Doreatha Massed, MD.   I, Doreatha Massed MD, have reviewed the above documentation for accuracy and completeness, and I agree with the above.   Doreatha Massed, MD   6/25/20246:16 PM  CHIEF COMPLAINT:   Diagnosis: mantle cell lymphoma   Cancer Staging  Mantle cell lymphoma (HCC) Staging form: Hodgkin and Non-Hodgkin Lymphoma, AJCC 8th Edition - Clinical stage from 02/11/2023: Stage III (Mantle cell lymphoma) - Signed by Doreatha Massed, MD on 02/11/2023    Prior Therapy: none  Current Therapy:  Bendamustine and rituximab every 28  days for 6 cycles    HISTORY OF PRESENT ILLNESS:   Oncology History  Mantle cell lymphoma (HCC)  02/11/2023 Initial Diagnosis   Mantle cell lymphoma (HCC)   02/11/2023 Cancer Staging   Staging form:  Hodgkin and Non-Hodgkin Lymphoma, AJCC 8th Edition - Clinical stage from 02/11/2023: Stage III (Mantle cell lymphoma) - Signed by Doreatha Massed, MD on 02/11/2023 Histopathologic type: Mantle cell lymphoma (Includes all variants: blastic, pleomorphic, small cell) Stage prefix: Initial diagnosis   03/02/2023 -  Chemotherapy   Patient is on Treatment Plan : NON-HODGKINS LYMPHOMA Rituximab D1 + Bendamustine D1,2 q28d x 6 cycles        INTERVAL HISTORY:   Lot is a 72 y.o. male presenting to clinic today for follow up of mantle cell lymphoma. He was last seen by me on 04/28/23.  Since his last visit, he underwent restaging PET scan on 05/21/23 showing: complete response to therapy, no residual hypermetabolic adenopathy; splenomegaly.  Today, he states that he is doing well overall. His appetite level is at 100%. His energy level is at 100%.  PAST MEDICAL HISTORY:   Past Medical History: Past Medical History:  Diagnosis Date   Anemia    Cataract    CLL (chronic lymphocytic leukemia) (HCC)    Erectile dysfunction    History of bronchitis    History of colon polyps    Hyperlipemia    Osteoarthritis    Shortness of breath dyspnea    with exertion    Surgical History: Past Surgical History:  Procedure Laterality Date   APPENDECTOMY     BRONCHIAL NEEDLE ASPIRATION BIOPSY  01/28/2023   Procedure: BRONCHIAL NEEDLE ASPIRATION BIOPSIES;  Surgeon: Josephine Igo, DO;  Location: [facility];  Service: Cardiopulmonary;;   CATARACT EXTRACTION W/PHACO Left 10/15/2015   Procedure: CATARACT EXTRACTION PHACO AND INTRAOCULAR LENS PLACEMENT LEFT EYE;  Surgeon: Gemma Payor, MD;  Location: [facility];  Service: Ophthalmology;  Laterality: Left;  CDE:12.20   CATARACT EXTRACTION W/PHACO Right 11/12/2015   Procedure: CATARACT EXTRACTION PHACO AND INTRAOCULAR LENS PLACEMENT RIGHT EYE:  CDE:  8.99;  Surgeon: Gemma Payor, MD;  Location: [facility];  Service: Ophthalmology;  Laterality: Right;   IR IMAGING  GUIDED PORT INSERTION  03/04/2023   KNEE SURGERY     Left x 2    MASS EXCISION N/A 09/12/2015   Procedure: EXCISION SOFT TISSUE NEOPLASM (6 CM), BACK;  Surgeon: Franky Macho, MD;  Location: [facility];  Service: General;  Laterality: N/A;   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   Procedure: TOTAL KNEE ARTHROPLASTY;  Surgeon: Durene Romans, MD;  Location: [facility];  Service: Orthopedics;  Laterality: Right;  70 mins   VIDEO BRONCHOSCOPY WITH ENDOBRONCHIAL ULTRASOUND Bilateral 01/28/2023   Procedure: VIDEO BRONCHOSCOPY WITH ENDOBRONCHIAL ULTRASOUND;  Surgeon: Josephine Igo, DO;  Location: [facility];  Service: Cardiopulmonary;  Laterality: Bilateral;    Social History: Social History   Socioeconomic History   Marital status: Media planner    Spouse name: Not on file   Number of children: 2   Years of education: 12 years   Highest education level: 12th grade  Occupational History   Occupation: RETIRED  Tobacco Use   Smoking status: Former    Packs/day: 1.00    Years: 10.00    Additional pack years: 0.00    Total pack years: 10.00    Types: Cigarettes, Pipe    Quit date: 09/10/1991    Years since quitting: 31.7   Smokeless tobacco: Never  Tobacco comments:    Smoked a pipe occasionally for 3 years.  Vaping Use   Vaping Use: Never used  Substance and Sexual Activity   Alcohol use: Not Currently    Alcohol/week: 1.0 standard drink of alcohol    Types: 1 Cans of beer per week   Drug use: Yes    Frequency: 1.0 times per week    Types: Marijuana   Sexual activity: Yes  Other Topics Concern   Not on file  Social History Narrative   2 sons who live close by   Lives with girlfriend   Social Determinants of Health   Financial Resource Strain: Low Risk  (11/06/2022)   Overall Financial Resource Strain (CARDIA)    Difficulty of Paying Living Expenses: Not hard at all  Food Insecurity: No Food Insecurity (04/20/2023)   Hunger Vital Sign    Worried About Running Out of Food in  the Last Year: Never true    Ran Out of Food in the Last Year: Never true  Transportation Needs: No Transportation Needs (04/20/2023)   PRAPARE - Administrator, Civil Service (Medical): No    Lack of Transportation (Non-Medical): No  Physical Activity: Sufficiently Active (11/06/2022)   Exercise Vital Sign    Days of Exercise per Week: 7 days    Minutes of Exercise per Session: 30 min  Stress: No Stress Concern Present (11/06/2022)   [employer] of Occupational Health - Occupational Stress Questionnaire    Feeling of Stress : Not at all  Social Connections: Socially Integrated (11/06/2022)   Social Connection and Isolation Panel [NHANES]    Frequency of Communication with Friends and Family: More than three times a week    Frequency of Social Gatherings with Friends and Family: More than three times a week    Attends Religious Services: 1 to 4 times per year    Active Member of [employer] or Organizations: Yes    Attends Engineer, structural: More than 4 times per year    Marital Status: Living with partner  Intimate Partner Violence: Not At Risk (04/20/2023)   Humiliation, Afraid, Rape, and Kick questionnaire    Fear of Current or Ex-Partner: No    Emotionally Abused: No    Physically Abused: No    Sexually Abused: No    Family History: Family History  Problem Relation Age of Onset   Colon cancer Father        age 52    Colon polyps Father    Heart disease Father        Heart failure   Heart disease Mother    Cancer Sister        Uterine, and ovarian   Hypertension Brother    Obesity Sister    Heart disease Maternal Grandmother    Esophageal cancer Neg Hx    Liver cancer Neg Hx    Pancreatic cancer Neg Hx    Rectal cancer Neg Hx    Stomach cancer Neg Hx     Current Medications:  Current Outpatient Medications:    allopurinol (ZYLOPRIM) 300 MG tablet, Take 1 tablet (300 mg total) by mouth daily., Disp: 30 tablet, Rfl: 3   antiseptic oral rinse  (BIOTENE) LIQD, 15 mLs by Mouth Rinse route as needed for dry mouth., Disp: , Rfl:    atorvastatin (LIPITOR) 40 MG tablet, Take 1 tablet (40 mg total) by mouth daily., Disp: 90 tablet, Rfl: 3   BENDAMUSTINE HCL IV, Inject into the vein  every 28 (twenty-eight) days. Days 1 and 2 every 28 days, Disp: , Rfl:    ferrous sulfate 325 (65 FE) MG tablet, Take 325 mg by mouth daily with breakfast., Disp: , Rfl:    Multiple Vitamin (MULTIVITAMIN WITH MINERALS) TABS tablet, Take 1 tablet by mouth daily., Disp: , Rfl:    OLIVE LEAF EXTRACT PO, Take 1 tablet by mouth in the morning and at bedtime., Disp: , Rfl:    OVER THE COUNTER MEDICATION, Take 1 each by mouth 5 (five) times daily. [name], Disp: , Rfl:    riTUXimab (RITUXAN IV), Inject into the vein every 28 (twenty-eight) days., Disp: , Rfl:    Vitamin D, Ergocalciferol, (DRISDOL) 1.25 MG (50000 UNIT) CAPS capsule, Take 1 capsule (50,000 Units total) by mouth every 7 (seven) days., Disp: 13 capsule, Rfl: 3   lidocaine-prilocaine (EMLA) cream, Apply to affected area once, Disp: 30 g, Rfl: 3   ondansetron (ZOFRAN) 4 MG tablet, Take 1 tablet (4 mg total) by mouth every 8 (eight) hours as needed for nausea or vomiting., Disp: 20 tablet, Rfl: 0   prochlorperazine (COMPAZINE) 10 MG tablet, Take 1 tablet (10 mg total) by mouth every 6 (six) hours as needed for nausea or vomiting., Disp: 30 tablet, Rfl: 1 No current facility-administered medications for this visit.  Facility-Administered Medications Ordered in Other Visits:    sodium chloride flush (NS) 0.9 % injection 10 mL, 10 mL, Intracatheter, PRN, Doreatha Massed, MD, 10 mL at 05/26/23 1458   Allergies: No Known Allergies  REVIEW OF SYSTEMS:   Review of Systems  Constitutional:  Negative for chills, fatigue and fever.  HENT:   Negative for lump/mass, mouth sores, nosebleeds, sore throat and trouble swallowing.   Eyes:  Negative for eye problems.  Respiratory:  Negative for cough and shortness  of breath.   Cardiovascular:  Negative for chest pain, leg swelling and palpitations.  Gastrointestinal:  Negative for abdominal pain, constipation, diarrhea, nausea and vomiting.  Genitourinary:  Negative for bladder incontinence, difficulty urinating, dysuria, frequency, hematuria and nocturia.   Musculoskeletal:  Negative for arthralgias, back pain, flank pain, myalgias and neck pain.  Skin:  Negative for itching and rash.  Neurological:  Negative for dizziness, headaches and numbness.  Hematological:  Does not bruise/bleed easily.  Psychiatric/Behavioral:  Negative for depression, sleep disturbance and suicidal ideas. The patient is not nervous/anxious.   All other systems reviewed and are negative.    VITALS:   There were no vitals taken for this visit.  Wt Readings from Last 3 Encounters:  05/26/23 212 lb 3.2 oz (96.3 kg)  04/28/23 198 lb 6.4 oz (90 kg)  04/20/23 192 lb 14.4 oz (87.5 kg)    There is no height or weight on file to calculate BMI.  Performance status (ECOG): 1 - Symptomatic but completely ambulatory  PHYSICAL EXAM:   Physical Exam Vitals and nursing note reviewed. Exam conducted with a chaperone present.  Constitutional:      Appearance: Normal appearance.  Cardiovascular:     Rate and Rhythm: Normal rate and regular rhythm.     Pulses: Normal pulses.     Heart sounds: Normal heart sounds.  Pulmonary:     Effort: Pulmonary effort is normal.     Breath sounds: Normal breath sounds.  Abdominal:     Palpations: Abdomen is soft. There is no hepatomegaly, splenomegaly or mass.     Tenderness: There is no abdominal tenderness.  Musculoskeletal:     Right lower leg: No  edema.     Left lower leg: No edema.  Lymphadenopathy:     Cervical: No cervical adenopathy.     Right cervical: No superficial, deep or posterior cervical adenopathy.    Left cervical: No superficial, deep or posterior cervical adenopathy.     Upper Body:     Right upper body: No  supraclavicular or axillary adenopathy.     Left upper body: No supraclavicular or axillary adenopathy.  Neurological:     General: No focal deficit present.     Mental Status: He is alert and oriented to person, place, and time.  Psychiatric:        Mood and Affect: Mood normal.        Behavior: Behavior normal.     LABS:      Latest Ref Rng & Units 05/26/2023    9:18 AM 04/28/2023    8:18 AM 04/22/2023    4:57 AM  CBC  WBC 4.0 - 10.5 K/uL 2.9  3.8  2.5   Hemoglobin 13.0 - 17.0 g/dL 13.2  44.0  8.2   Hematocrit 39.0 - 52.0 % 33.5  32.4  25.7   Platelets 150 - 400 K/uL 125  158  98       Latest Ref Rng & Units 05/26/2023    9:18 AM 04/28/2023    8:18 AM 04/22/2023    4:57 AM  CMP  Glucose 70 - 99 mg/dL 91  102  97   BUN 8 - 23 mg/dL 16  14  11    Creatinine 0.61 - 1.24 mg/dL 7.25  3.66  4.40   Sodium 135 - 145 mmol/L 137  139  137   Potassium 3.5 - 5.1 mmol/L 4.3  3.8  3.8   Chloride 98 - 111 mmol/L 103  102  104   CO2 22 - 32 mmol/L 29  24  26    Calcium 8.9 - 10.3 mg/dL 8.5  8.5  8.1   Total Protein 6.5 - 8.1 g/dL 5.5  5.6    Total Bilirubin 0.3 - 1.2 mg/dL 0.8  0.5    Alkaline Phos 38 - 126 U/L 100  85    AST 15 - 41 U/L 19  16    ALT 0 - 44 U/L 19  18       No results found for: "CEA1", "CEA" / No results found for: "CEA1", "CEA" Lab Results  Component Value Date   PSA1 0.6 06/06/2020   No results found for: "HKV425" No results found for: "CAN125"  Lab Results  Component Value Date   TOTALPROTELP 6.0 01/26/2019   ALBUMINELP 3.9 01/26/2019   A1GS 0.2 01/26/2019   A2GS 0.5 01/26/2019   BETS 0.9 01/26/2019   GAMS 0.5 01/26/2019   MSPIKE Not Observed 01/26/2019   SPEI Comment 01/26/2019   Lab Results  Component Value Date   TIBC 310 08/21/2021   FERRITIN 421 (H) 08/21/2021   IRONPCTSAT 20 08/21/2021   Lab Results  Component Value Date   LDH 130 05/26/2023   LDH 134 02/26/2023   LDH 140 12/29/2022     STUDIES:   NM PET Image Restag (PS) Skull  Base To Thigh  Result Date: 05/25/2023 CLINICAL DATA:  Subsequent treatment strategy for mantle cell lymphoma. EXAM: NUCLEAR MEDICINE PET SKULL BASE TO THIGH TECHNIQUE: 9.1 mCi F-18 FDG was injected intravenously. Full-ring PET imaging was performed from the skull base to thigh after the radiotracer. CT data was obtained and used for attenuation correction and  anatomic localization. Fasting blood glucose: 87 mg/dl COMPARISON:  09/27/2535. FINDINGS: Mediastinal blood pool activity: SUV max 2.0 Liver activity: SUV max 2.6 NECK: No abnormal hypermetabolism. Incidental CT findings: None. CHEST: No abnormal hypermetabolism. Incidental CT findings: Right IJ Port-A-Cath terminates in the SVC. Atherosclerotic calcification of the aorta and coronary arteries. Heart is at the upper limits of normal in size. No pericardial or pleural effusion. ABDOMEN/PELVIS: No residual hypermetabolic adenopathy. No abnormal hypermetabolism in the liver, adrenal glands, spleen or pancreas. Incidental CT findings: Liver, gallbladder, adrenal glands and kidneys are grossly unremarkable. Spleen is enlarged. Pancreas, stomach and bowel are grossly unremarkable. Abdominal peritoneal ligament and retroperitoneal lymph nodes are not enlarged by CT size criteria. SKELETON: Mild patchy metabolism throughout the visualized osseous structures. No focal abnormal uptake. Incidental CT findings: Degenerative changes in the spine. IMPRESSION: 1. Complete response to therapy. No residual hypermetabolic adenopathy. 2. Splenomegaly. 3. Aortic atherosclerosis (ICD10-I70.0). Coronary artery calcification. Electronically Signed   By: Leanna Battles M.D.   On: 05/25/2023 16:36

## 2023-05-26 ENCOUNTER — Inpatient Hospital Stay: Payer: Medicare Other | Attending: Hematology | Admitting: Hematology

## 2023-05-26 ENCOUNTER — Inpatient Hospital Stay: Payer: Medicare Other

## 2023-05-26 VITALS — BP 114/53 | HR 62 | Temp 97.6°F | Resp 18

## 2023-05-26 VITALS — BP 110/53 | HR 57 | Temp 97.5°F | Resp 20 | Wt 212.2 lb

## 2023-05-26 DIAGNOSIS — Z5112 Encounter for antineoplastic immunotherapy: Secondary | ICD-10-CM | POA: Insufficient documentation

## 2023-05-26 DIAGNOSIS — Z87891 Personal history of nicotine dependence: Secondary | ICD-10-CM | POA: Insufficient documentation

## 2023-05-26 DIAGNOSIS — Z5111 Encounter for antineoplastic chemotherapy: Secondary | ICD-10-CM | POA: Diagnosis present

## 2023-05-26 DIAGNOSIS — Z95828 Presence of other vascular implants and grafts: Secondary | ICD-10-CM

## 2023-05-26 DIAGNOSIS — Z5189 Encounter for other specified aftercare: Secondary | ICD-10-CM | POA: Insufficient documentation

## 2023-05-26 DIAGNOSIS — C8318 Mantle cell lymphoma, lymph nodes of multiple sites: Secondary | ICD-10-CM

## 2023-05-26 DIAGNOSIS — C8310 Mantle cell lymphoma, unspecified site: Secondary | ICD-10-CM | POA: Diagnosis not present

## 2023-05-26 LAB — CBC WITH DIFFERENTIAL/PLATELET
Abs Immature Granulocytes: 0.01 10*3/uL (ref 0.00–0.07)
Basophils Absolute: 0 10*3/uL (ref 0.0–0.1)
Basophils Relative: 1 %
Eosinophils Absolute: 0 10*3/uL (ref 0.0–0.5)
Eosinophils Relative: 0 %
HCT: 33.5 % — ABNORMAL LOW (ref 39.0–52.0)
Hemoglobin: 11.1 g/dL — ABNORMAL LOW (ref 13.0–17.0)
Immature Granulocytes: 0 %
Lymphocytes Relative: 23 %
Lymphs Abs: 0.7 10*3/uL (ref 0.7–4.0)
MCH: 31.4 pg (ref 26.0–34.0)
MCHC: 33.1 g/dL (ref 30.0–36.0)
MCV: 94.9 fL (ref 80.0–100.0)
Monocytes Absolute: 0.3 10*3/uL (ref 0.1–1.0)
Monocytes Relative: 9 %
Neutro Abs: 2 10*3/uL (ref 1.7–7.7)
Neutrophils Relative %: 67 %
Platelets: 125 10*3/uL — ABNORMAL LOW (ref 150–400)
RBC: 3.53 MIL/uL — ABNORMAL LOW (ref 4.22–5.81)
RDW: 17.7 % — ABNORMAL HIGH (ref 11.5–15.5)
WBC: 2.9 10*3/uL — ABNORMAL LOW (ref 4.0–10.5)
nRBC: 0 % (ref 0.0–0.2)

## 2023-05-26 LAB — URIC ACID: Uric Acid, Serum: 3.8 mg/dL (ref 3.7–8.6)

## 2023-05-26 LAB — COMPREHENSIVE METABOLIC PANEL
ALT: 19 U/L (ref 0–44)
AST: 19 U/L (ref 15–41)
Albumin: 3.8 g/dL (ref 3.5–5.0)
Alkaline Phosphatase: 100 U/L (ref 38–126)
Anion gap: 5 (ref 5–15)
BUN: 16 mg/dL (ref 8–23)
CO2: 29 mmol/L (ref 22–32)
Calcium: 8.5 mg/dL — ABNORMAL LOW (ref 8.9–10.3)
Chloride: 103 mmol/L (ref 98–111)
Creatinine, Ser: 0.67 mg/dL (ref 0.61–1.24)
GFR, Estimated: >60 mL/min (ref >60)
Glucose, Bld: 91 mg/dL (ref 70–99)
Potassium: 4.3 mmol/L (ref 3.5–5.1)
Sodium: 137 mmol/L (ref 135–145)
Total Bilirubin: 0.8 mg/dL (ref 0.3–1.2)
Total Protein: 5.5 g/dL — ABNORMAL LOW (ref 6.5–8.1)

## 2023-05-26 LAB — LACTATE DEHYDROGENASE: LDH: 130 U/L (ref 98–192)

## 2023-05-26 LAB — MAGNESIUM: Magnesium: 2.1 mg/dL (ref 1.7–2.4)

## 2023-05-26 MED ORDER — SODIUM CHLORIDE 0.9% FLUSH
10.0000 mL | Freq: Once | INTRAVENOUS | Status: AC
  Administered 2023-05-26: 10 mL via INTRAVENOUS

## 2023-05-26 MED ORDER — CETIRIZINE HCL 10 MG/ML IV SOLN
10.0000 mg | Freq: Once | INTRAVENOUS | Status: AC
  Administered 2023-05-26: 10 mg via INTRAVENOUS
  Filled 2023-05-26: qty 1

## 2023-05-26 MED ORDER — FAMOTIDINE IN NACL 20-0.9 MG/50ML-% IV SOLN
20.0000 mg | Freq: Once | INTRAVENOUS | Status: AC
  Administered 2023-05-26: 20 mg via INTRAVENOUS
  Filled 2023-05-26: qty 50

## 2023-05-26 MED ORDER — SODIUM CHLORIDE 0.9% FLUSH
10.0000 mL | INTRAVENOUS | Status: DC | PRN
  Administered 2023-05-26: 10 mL

## 2023-05-26 MED ORDER — SODIUM CHLORIDE 0.9 % IV SOLN
70.0000 mg/m2 | Freq: Once | INTRAVENOUS | Status: AC
  Administered 2023-05-26: 152.5 mg via INTRAVENOUS
  Filled 2023-05-26: qty 6.1

## 2023-05-26 MED ORDER — PALONOSETRON HCL INJECTION 0.25 MG/5ML
0.2500 mg | Freq: Once | INTRAVENOUS | Status: AC
  Administered 2023-05-26: 0.25 mg via INTRAVENOUS
  Filled 2023-05-26: qty 5

## 2023-05-26 MED ORDER — ACETAMINOPHEN 325 MG PO TABS
650.0000 mg | ORAL_TABLET | Freq: Once | ORAL | Status: AC
  Administered 2023-05-26: 650 mg via ORAL
  Filled 2023-05-26: qty 2

## 2023-05-26 MED ORDER — HEPARIN SOD (PORK) LOCK FLUSH 100 UNIT/ML IV SOLN
500.0000 [IU] | Freq: Once | INTRAVENOUS | Status: AC | PRN
  Administered 2023-05-26: 500 [IU]

## 2023-05-26 MED ORDER — SODIUM CHLORIDE 0.9 % IV SOLN: Freq: Once | INTRAVENOUS | Status: AC

## 2023-05-26 MED ORDER — SODIUM CHLORIDE 0.9 % IV SOLN
10.0000 mg | Freq: Once | INTRAVENOUS | Status: AC
  Administered 2023-05-26: 10 mg via INTRAVENOUS
  Filled 2023-05-26: qty 10

## 2023-05-26 MED ORDER — SODIUM CHLORIDE 0.9 % IV SOLN
375.0000 mg/m2 | Freq: Once | INTRAVENOUS | Status: AC
  Administered 2023-05-26: 800 mg via INTRAVENOUS
  Filled 2023-05-26: qty 50

## 2023-05-26 NOTE — Patient Instructions (Signed)
[facility] at [facility] Discharge Instructions   You were seen and examined today by Dr. Ellin Saba.  He reviewed the results of your lab work which are normal/stable.   He reviewed the results of your PET scan which shows that the lymphoma has completely resolved.   We will proceed with your treatment today.   Return as scheduled.    Thank you for choosing [facility] at [facility] to provide your oncology and hematology care.  To afford each patient quality time with our provider, please arrive at least 15 minutes before your scheduled appointment time.   If you have a lab appointment with the Cancer Center please come in thru the Main Entrance and check in at the main information desk.  You need to re-schedule your appointment should you arrive 10 or more minutes late.  We strive to give you quality time with our providers, and arriving late affects you and other patients whose appointments are after yours.  Also, if you no show three or more times for appointments you may be dismissed from the clinic at the providers discretion.     Again, thank you for choosing [facility].  Our hope is that these requests will decrease the amount of time that you wait before being seen by our physicians.       _____________________________________________________________  Should you have questions after your visit to [facility], please contact our office at [phone] and follow the prompts.  Our office hours are 8:00 a.m. and 4:30 p.m. Monday - Friday.  Please note that voicemails left after 4:00 p.m. may not be returned until the following business day.  We are closed weekends and major holidays.  You do have access to a nurse 24-7, just call the main number to the clinic [phone] and do not press any options, hold on the line and a nurse will answer the phone.    For prescription refill requests, have your pharmacy  contact our office and allow 72 hours.    Due to Covid, you will need to wear a mask upon entering the hospital. If you do not have a mask, a mask will be given to you at the Main Entrance upon arrival. For doctor visits, patients may have 1 support person age 52 or older with them. For treatment visits, patients can not have anyone with them due to social distancing guidelines and our immunocompromised population.

## 2023-05-26 NOTE — Progress Notes (Signed)
Patient presents today for chemotherapy infusion. Patient is in satisfactory condition with no new complaints voiced.  Vital signs are stable.  Labs reviewed by Dr. Katragadda during the office visit and all labs are within treatment parameters.  We will proceed with treatment per MD orders.   Patient tolerated treatment well with no complaints voiced.  Patient left ambulatory in stable condition.  Vital signs stable at discharge.  Follow up as scheduled.       

## 2023-05-26 NOTE — Progress Notes (Signed)
Patient has been examined by Dr. Katragadda. Vital signs and labs have been reviewed by MD - ANC, Creatinine, LFTs, hemoglobin, and platelets are within treatment parameters per M.D. - pt may proceed with treatment.  Primary RN and pharmacy notified.  

## 2023-05-26 NOTE — Patient Instructions (Signed)
[facility] [place]  Discharge Instructions: Thank you for choosing [facility] to provide your oncology and hematology care.  If you have a lab appointment with the Cancer Center - please note that after April 8th, 2024, all labs will be drawn in the cancer center.  You do not have to check in or register with the main entrance as you have in the past but will complete your check-in in the cancer center.  Wear comfortable clothing and clothing appropriate for easy access to any Portacath or PICC line.   We strive to give you quality time with your provider. You may need to reschedule your appointment if you arrive late (15 or more minutes).  Arriving late affects you and other patients whose appointments are after yours.  Also, if you miss three or more appointments without notifying the office, you may be dismissed from the clinic at the provider's discretion.      For prescription refill requests, have your pharmacy contact our office and allow 72 hours for refills to be completed.    Today you received the following chemotherapy and/or immunotherapy agents Rituxan/Bendeka.  Rituximab Injection What is this medication? RITUXIMAB (ri TUX i mab) treats leukemia and lymphoma. It works by blocking a protein that causes cancer cells to grow and multiply. This helps to slow or stop the spread of cancer cells. It may also be used to treat autoimmune conditions, such as arthritis. It works by slowing down an overactive immune system. It is a monoclonal antibody. This medicine may be used for other purposes; ask your health care provider or pharmacist if you have questions. COMMON BRAND NAME(S): RIABNI, Rituxan, RUXIENCE, truxima What should I tell my care team before I take this medication? They need to know if you have any of these conditions: Chest pain Heart disease Immune system problems Infection, such as chickenpox, cold sores, hepatitis B, herpes Irregular  heartbeat or rhythm Kidney disease Low blood counts, such as low white cells, platelets, red cells Lung disease Recent or upcoming vaccine An unusual or allergic reaction to rituximab, other medications, foods, dyes, or preservatives Pregnant or trying to get pregnant Breast-feeding How should I use this medication? This medication is injected into a vein. It is given by a care team in a hospital or clinic setting. A special MedGuide will be given to you before each treatment. Be sure to read this information carefully each time. Talk to your care team about the use of this medication in children. While this medication may be prescribed for children as young as 6 months for selected conditions, precautions do apply. Overdosage: If you think you have taken too much of this medicine contact a poison control center or emergency room at once. NOTE: This medicine is only for you. Do not share this medicine with others. What if I miss a dose? Keep appointments for follow-up doses. It is important not to miss your dose. Call your care team if you are unable to keep an appointment. What may interact with this medication? Do not take this medication with any of the following: Live vaccines This medication may also interact with the following: Cisplatin This list may not describe all possible interactions. Give your health care provider a list of all the medicines, herbs, non-prescription drugs, or dietary supplements you use. Also tell them if you smoke, drink alcohol, or use illegal drugs. Some items may interact with your medicine. What should I watch for while using this medication?   Your condition will be monitored carefully while you are receiving this medication. You may need blood work while taking this medication. This medication can cause serious infusion reactions. To reduce the risk your care team may give you other medications to take before receiving this one. Be sure to follow the  directions from your care team. This medication may increase your risk of getting an infection. Call your care team for advice if you get a fever, chills, sore throat, or other symptoms of a cold or flu. Do not treat yourself. Try to avoid being around people who are sick. Call your care team if you are around anyone with measles, chickenpox, or if you develop sores or blisters that do not heal properly. Avoid taking medications that contain aspirin, acetaminophen, ibuprofen, naproxen, or ketoprofen unless instructed by your care team. These medications may hide a fever. This medication may cause serious skin reactions. They can happen weeks to months after starting the medication. Contact your care team right away if you notice fevers or flu-like symptoms with a rash. The rash may be red or purple and then turn into blisters or peeling of the skin. You may also notice a red rash with swelling of the face, lips, or lymph nodes in your neck or under your arms. In some patients, this medication may cause a serious brain infection that may cause death. If you have any problems seeing, thinking, speaking, walking, or standing, tell your care team right away. If you cannot reach your care team, urgently seek another source of medical care. Talk to your care team if you may be pregnant. Serious birth defects can occur if you take this medication during pregnancy and for 12 months after the last dose. You will need a negative pregnancy test before starting this medication. Contraception is recommended while taking this medication and for 12 months after the last dose. Your care team can help you find the option that works for you. Do not breastfeed while taking this medication and for at least 6 months after the last dose. What side effects may I notice from receiving this medication? Side effects that you should report to your care team as soon as possible: Allergic reactions or angioedema--skin rash, itching or  hives, swelling of the face, eyes, lips, tongue, arms, or legs, trouble swallowing or breathing Bowel blockage--stomach cramping, unable to have a bowel movement or pass gas, loss of appetite, vomiting Dizziness, loss of balance or coordination, confusion or trouble speaking Heart attack--pain or tightness in the chest, shoulders, arms, or jaw, nausea, shortness of breath, cold or clammy skin, feeling faint or lightheaded Heart rhythm changes--fast or irregular heartbeat, dizziness, feeling faint or lightheaded, chest pain, trouble breathing Infection--fever, chills, cough, sore throat, wounds that don't heal, pain or trouble when passing urine, general feeling of discomfort or being unwell Infusion reactions--chest pain, shortness of breath or trouble breathing, feeling faint or lightheaded Kidney injury--decrease in the amount of urine, swelling of the ankles, hands, or feet Liver injury--right upper belly pain, loss of appetite, nausea, light-colored stool, dark yellow or Naomie Crow urine, yellowing skin or eyes, unusual weakness or fatigue Redness, blistering, peeling, or loosening of the skin, including inside the mouth Stomach pain that is severe, does not go away, or gets worse Tumor lysis syndrome (TLS)--nausea, vomiting, diarrhea, decrease in the amount of urine, dark urine, unusual weakness or fatigue, confusion, muscle pain or cramps, fast or irregular heartbeat, joint pain Side effects that usually do not require medical attention (  report to your care team if they continue or are bothersome): Headache Joint pain Nausea Runny or stuffy nose Unusual weakness or fatigue This list may not describe all possible side effects. Call your doctor for medical advice about side effects. You may report side effects to FDA at 1-800-FDA-1088. Where should I keep my medication? This medication is given in a hospital or clinic. It will not be stored at home. NOTE: This sheet is a summary. It may not cover  all possible information. If you have questions about this medicine, talk to your doctor, pharmacist, or health care provider.  2024 Elsevier/Gold Standard (2022-04-10 00:00:00)    Bendamustine Injection What is this medication? BENDAMUSTINE (BEN da MUS teen) treats leukemia and lymphoma. It works by slowing down the growth of cancer cells. This medicine may be used for other purposes; ask your health care provider or pharmacist if you have questions. COMMON BRAND NAME(S): BELRAPZO, BENDEKA, Treanda, VIVIMUSTA What should I tell my care team before I take this medication? They need to know if you have any of these conditions: Infection, especially a viral infection, such as chickenpox, cold sores, herpes Kidney disease Liver disease An unusual or allergic reaction to bendamustine, mannitol, other medications, foods, dyes, or preservatives Pregnant or trying to get pregnant Breast-feeding How should I use this medication? This medication is injected into a vein. It is given by your care team in a hospital or clinic setting. Talk to your care team about the use of this medication in children. Special care may be needed. Overdosage: If you think you have taken too much of this medicine contact a poison control center or emergency room at once. NOTE: This medicine is only for you. Do not share this medicine with others. What if I miss a dose? Keep appointments for follow-up doses. It is important not to miss your dose. Call your care team if you are unable to keep an appointment. What may interact with this medication? Do not take this medication with any of the following: Clozapine This medication may also interact with the following: Atazanavir Cimetidine Ciprofloxacin Enoxacin Fluvoxamine Medications for seizures, such as carbamazepine, phenobarbital Mexiletine Rifampin Tacrine Thiabendazole Zileuton This list may not describe all possible interactions. Give your health care  provider a list of all the medicines, herbs, non-prescription drugs, or dietary supplements you use. Also tell them if you smoke, drink alcohol, or use illegal drugs. Some items may interact with your medicine. What should I watch for while using this medication? Visit your care team for regular checks on your progress. This medication may make you feel generally unwell. This is not uncommon, as chemotherapy can affect healthy cells as well as cancer cells. Report any side effects. Continue your course of treatment even though you feel ill unless your care team tells you to stop. You may need blood work while taking this medication. This medication may increase your risk of getting an infection. Call your care team for advice if you get a fever, chills, sore throat, or other symptoms of a cold or flu. Do not treat yourself. Try to avoid being around people who are sick. This medication may cause serious skin reactions. They can happen weeks to months after starting the medication. Contact your care team right away if you notice fevers or flu-like symptoms with a rash. The rash may be red or purple and then turn into blisters or peeling of the skin. You may also notice a red rash with swelling of the   face, lips, or lymph nodes in your neck or under your arms. In some patients, this medication may cause a serious brain infection that may cause death. If you have any problems seeing, thinking, speaking, walking, or standing, tell your care team right away. If you cannot reach your care team, urgently seek other source of medical care. This medication may increase your risk to bruise or bleed. Call your care team if you notice any unusual bleeding. Talk to your care team about your risk of cancer. You may be more at risk for certain types of cancer if you take this medication. Talk to your care team about your risk of skin cancer. You may be more at risk for skin cancer if you take this medication. Talk to  your care team if you or your partner wish to become pregnant or think either of you might be pregnant. This medication can cause serious birth defects if taken during pregnancy or for up to 6 months after the last dose. A negative pregnancy test is required before starting this medication. A reliable form of contraception is recommended while taking this medication and for 6 months after the last dose. Talk to your care team about reliable forms of contraception. Wear a condom while taking this medication and for at least 3 months after the last dose. Do not breast-feed while taking this medication or for at least 1 week after the last dose. This medication may cause infertility. Talk to your care team if you are concerned about your fertility. What side effects may I notice from receiving this medication? Side effects that you should report to your care team as soon as possible: Allergic reactions--skin rash, itching, hives, swelling of the face, lips, tongue, or throat Infection--fever, chills, cough, sore throat, wounds that don't heal, pain or trouble when passing urine, general feeling of discomfort or being unwell Infusion reactions--chest pain, shortness of breath or trouble breathing, feeling faint or lightheaded Liver injury--right upper belly pain, loss of appetite, nausea, light-colored stool, dark yellow or Wendel Homeyer urine, yellowing skin or eyes, unusual weakness or fatigue Low red blood cell level--unusual weakness or fatigue, dizziness, headache, trouble breathing Painful swelling, warmth, or redness of the skin, blisters or sores at the infusion site Rash, fever, and swollen lymph nodes Redness, blistering, peeling, or loosening of the skin, including inside the mouth Tumor lysis syndrome (TLS)--nausea, vomiting, diarrhea, decrease in the amount of urine, dark urine, unusual weakness or fatigue, confusion, muscle pain or cramps, fast or irregular heartbeat, joint pain Unusual bruising or  bleeding Side effects that usually do not require medical attention (report to your care team if they continue or are bothersome): Diarrhea Fatigue Headache Loss of appetite Nausea Vomiting This list may not describe all possible side effects. Call your doctor for medical advice about side effects. You may report side effects to FDA at 1-800-FDA-1088. Where should I keep my medication? This medication is given in a hospital or clinic. It will not be stored at home. NOTE: This sheet is a summary. It may not cover all possible information. If you have questions about this medicine, talk to your doctor, pharmacist, or health care provider.  2024 Elsevier/Gold Standard (2022-03-11 00:00:00)        To help prevent nausea and vomiting after your treatment, we encourage you to take your nausea medication as directed.  BELOW ARE SYMPTOMS THAT SHOULD BE REPORTED IMMEDIATELY: *FEVER GREATER THAN 100.4 F (38 C) OR HIGHER *CHILLS OR SWEATING *NAUSEA AND VOMITING   THAT IS NOT CONTROLLED WITH YOUR NAUSEA MEDICATION *UNUSUAL SHORTNESS OF BREATH *UNUSUAL BRUISING OR BLEEDING *URINARY PROBLEMS (pain or burning when urinating, or frequent urination) *BOWEL PROBLEMS (unusual diarrhea, constipation, pain near the anus) TENDERNESS IN MOUTH AND THROAT WITH OR WITHOUT PRESENCE OF ULCERS (sore throat, sores in mouth, or a toothache) UNUSUAL RASH, SWELLING OR PAIN  UNUSUAL VAGINAL DISCHARGE OR ITCHING   Items with * indicate a potential emergency and should be followed up as soon as possible or go to the Emergency Department if any problems should occur.  Please show the CHEMOTHERAPY ALERT CARD or IMMUNOTHERAPY ALERT CARD at check-in to the Emergency Department and triage nurse.  Should you have questions after your visit or need to cancel or reschedule your appointment, please contact [facility] [phone]  and follow the prompts.  Office hours are 8:00 a.m. to 4:30 p.m. Monday -  Friday. Please note that voicemails left after 4:00 p.m. may not be returned until the following business day.  We are closed weekends and major holidays. You have access to a nurse at all times for urgent questions. Please call the main number to the clinic [phone] and follow the prompts.  For any non-urgent questions, you may also contact your provider using MyChart. We now offer e-Visits for anyone 18 and older to request care online for non-urgent symptoms. For details visit [email or web address].   Also download the MyChart app! Go to the app store, search "MyChart", open the app, select [place] [facility], and log in with your MyChart username and password.   

## 2023-05-27 ENCOUNTER — Other Ambulatory Visit: Payer: Self-pay

## 2023-05-27 ENCOUNTER — Inpatient Hospital Stay: Payer: Medicare Other

## 2023-05-27 VITALS — BP 90/61 | HR 59 | Temp 97.5°F | Resp 18

## 2023-05-27 DIAGNOSIS — C8318 Mantle cell lymphoma, lymph nodes of multiple sites: Secondary | ICD-10-CM

## 2023-05-27 DIAGNOSIS — Z5112 Encounter for antineoplastic immunotherapy: Secondary | ICD-10-CM | POA: Diagnosis not present

## 2023-05-27 MED ORDER — HEPARIN SOD (PORK) LOCK FLUSH 100 UNIT/ML IV SOLN
500.0000 [IU] | Freq: Once | INTRAVENOUS | Status: AC | PRN
  Administered 2023-05-27: 500 [IU]

## 2023-05-27 MED ORDER — SODIUM CHLORIDE 0.9 % IV SOLN
70.0000 mg/m2 | Freq: Once | INTRAVENOUS | Status: AC
  Administered 2023-05-27: 152.5 mg via INTRAVENOUS
  Filled 2023-05-27: qty 6.1

## 2023-05-27 MED ORDER — SODIUM CHLORIDE 0.9% FLUSH
10.0000 mL | INTRAVENOUS | Status: DC | PRN
  Administered 2023-05-27: 10 mL

## 2023-05-27 MED ORDER — PEGFILGRASTIM 6 MG/0.6ML ~~LOC~~ PSKT
6.0000 mg | PREFILLED_SYRINGE | Freq: Once | SUBCUTANEOUS | Status: AC
  Administered 2023-05-27: 6 mg via SUBCUTANEOUS
  Filled 2023-05-27: qty 0.6

## 2023-05-27 MED ORDER — SODIUM CHLORIDE 0.9 % IV SOLN
10.0000 mg | Freq: Once | INTRAVENOUS | Status: AC
  Administered 2023-05-27: 10 mg via INTRAVENOUS
  Filled 2023-05-27: qty 1

## 2023-05-27 MED ORDER — SODIUM CHLORIDE 0.9 % IV SOLN: Freq: Once | INTRAVENOUS | Status: AC

## 2023-05-27 NOTE — Progress Notes (Signed)
Patient presents today for chemotherapy infusion.  Patient is in satisfactory condition with no new complaints voiced.  Vital signs are stable.  Labs from 05/26/23 reviewed. We will proceed with treatment per MD orders.    Patient tolerated treatment well with no complaints voiced.  Neulasta OnPro applied to R arm.  Neulasta activated properly and no discomfort noted from patient.  Patient left ambulatory in stable condition.  Vital signs stable at discharge.  Follow up as scheduled.

## 2023-05-27 NOTE — Patient Instructions (Signed)
[facility]  Discharge Instructions: Thank you for choosing  Cancer Center to provide your oncology and hematology care.  If you have a lab appointment with the Cancer Center - please note that after April 8th, 2024, all labs will be drawn in the cancer center.  You do not have to check in or register with the main entrance as you have in the past but will complete your check-in in the cancer center.  Wear comfortable clothing and clothing appropriate for easy access to any Portacath or PICC line.   We strive to give you quality time with your provider. You may need to reschedule your appointment if you arrive late (15 or more minutes).  Arriving late affects you and other patients whose appointments are after yours.  Also, if you miss three or more appointments without notifying the office, you may be dismissed from the clinic at the provider's discretion.      For prescription refill requests, have your pharmacy contact our office and allow 72 hours for refills to be completed.    Today you received the following chemotherapy and/or immunotherapy agents Bendeka/Neulasta.  Bendamustine Injection What is this medication? BENDAMUSTINE (BEN da MUS teen) treats leukemia and lymphoma. It works by slowing down the growth of cancer cells. This medicine may be used for other purposes; ask your health care provider or pharmacist if you have questions. COMMON BRAND NAME(S): Marilynne Halsted, VIVIMUSTA What should I tell my care team before I take this medication? They need to know if you have any of these conditions: Infection, especially a viral infection, such as chickenpox, cold sores, herpes Kidney disease Liver disease An unusual or allergic reaction to bendamustine, mannitol, other medications, foods, dyes, or preservatives Pregnant or trying to get pregnant Breast-feeding How should I use this medication? This medication is injected into a vein. It  is given by your care team in a hospital or clinic setting. Talk to your care team about the use of this medication in children. Special care may be needed. Overdosage: If you think you have taken too much of this medicine contact a poison control center or emergency room at once. NOTE: This medicine is only for you. Do not share this medicine with others. What if I miss a dose? Keep appointments for follow-up doses. It is important not to miss your dose. Call your care team if you are unable to keep an appointment. What may interact with this medication? Do not take this medication with any of the following: Clozapine This medication may also interact with the following: Atazanavir Cimetidine Ciprofloxacin Enoxacin Fluvoxamine Medications for seizures, such as carbamazepine, phenobarbital Mexiletine Rifampin Tacrine Thiabendazole Zileuton This list may not describe all possible interactions. Give your health care provider a list of all the medicines, herbs, non-prescription drugs, or dietary supplements you use. Also tell them if you smoke, drink alcohol, or use illegal drugs. Some items may interact with your medicine. What should I watch for while using this medication? Visit your care team for regular checks on your progress. This medication may make you feel generally unwell. This is not uncommon, as chemotherapy can affect healthy cells as well as cancer cells. Report any side effects. Continue your course of treatment even though you feel ill unless your care team tells you to stop. You may need blood work while taking this medication. This medication may increase your risk of getting an infection. Call your care team for advice if you get  a fever, chills, sore throat, or other symptoms of a cold or flu. Do not treat yourself. Try to avoid being around people who are sick. This medication may cause serious skin reactions. They can happen weeks to months after starting the medication.  Contact your care team right away if you notice fevers or flu-like symptoms with a rash. The rash may be red or purple and then turn into blisters or peeling of the skin. You may also notice a red rash with swelling of the face, lips, or lymph nodes in your neck or under your arms. In some patients, this medication may cause a serious brain infection that may cause death. If you have any problems seeing, thinking, speaking, walking, or standing, tell your care team right away. If you cannot reach your care team, urgently seek other source of medical care. This medication may increase your risk to bruise or bleed. Call your care team if you notice any unusual bleeding. Talk to your care team about your risk of cancer. You may be more at risk for certain types of cancer if you take this medication. Talk to your care team about your risk of skin cancer. You may be more at risk for skin cancer if you take this medication. Talk to your care team if you or your partner wish to become pregnant or think either of you might be pregnant. This medication can cause serious birth defects if taken during pregnancy or for up to 6 months after the last dose. A negative pregnancy test is required before starting this medication. A reliable form of contraception is recommended while taking this medication and for 6 months after the last dose. Talk to your care team about reliable forms of contraception. Wear a condom while taking this medication and for at least 3 months after the last dose. Do not breast-feed while taking this medication or for at least 1 week after the last dose. This medication may cause infertility. Talk to your care team if you are concerned about your fertility. What side effects may I notice from receiving this medication? Side effects that you should report to your care team as soon as possible: Allergic reactions--skin rash, itching, hives, swelling of the face, lips, tongue, or  throat Infection--fever, chills, cough, sore throat, wounds that don't heal, pain or trouble when passing urine, general feeling of discomfort or being unwell Infusion reactions--chest pain, shortness of breath or trouble breathing, feeling faint or lightheaded Liver injury--right upper belly pain, loss of appetite, nausea, light-colored stool, dark yellow or [place] Herd urine, yellowing skin or eyes, unusual weakness or fatigue Low red blood cell level--unusual weakness or fatigue, dizziness, headache, trouble breathing Painful swelling, warmth, or redness of the skin, blisters or sores at the infusion site Rash, fever, and swollen lymph nodes Redness, blistering, peeling, or loosening of the skin, including inside the mouth Tumor lysis syndrome (TLS)--nausea, vomiting, diarrhea, decrease in the amount of urine, dark urine, unusual weakness or fatigue, confusion, muscle pain or cramps, fast or irregular heartbeat, joint pain Unusual bruising or bleeding Side effects that usually do not require medical attention (report to your care team if they continue or are bothersome): Diarrhea Fatigue Headache Loss of appetite Nausea Vomiting This list may not describe all possible side effects. Call your doctor for medical advice about side effects. You may report side effects to FDA at 1-800-FDA-1088. Where should I keep my medication? This medication is given in a hospital or clinic. It will not be stored  at home. NOTE: This sheet is a summary. It may not cover all possible information. If you have questions about this medicine, talk to your doctor, pharmacist, or health care provider.  2024 Elsevier/Gold Standard (2022-03-11 00:00:00)    Pegfilgrastim Injection What is this medication? PEGFILGRASTIM (PEG fil gra stim) lowers the risk of infection in people who are receiving chemotherapy. It works by Systems analyst make more white blood cells, which protects your body from infection. It may also be  used to help people who have been exposed to high doses of radiation. This medicine may be used for other purposes; ask your health care provider or pharmacist if you have questions. COMMON BRAND NAME(S): Cherly Hensen, Neulasta, Nyvepria, Stimufend, UDENYCA, UDENYCA ONBODY, Ziextenzo What should I tell my care team before I take this medication? They need to know if you have any of these conditions: Kidney disease Latex allergy Ongoing radiation therapy Sickle cell disease Skin reactions to acrylic adhesives (On-Body Injector only) An unusual or allergic reaction to pegfilgrastim, filgrastim, other medications, foods, dyes, or preservatives Pregnant or trying to get pregnant Breast-feeding How should I use this medication? This medication is for injection under the skin. If you get this medication at home, you will be taught how to prepare and give the pre-filled syringe or how to use the On-body Injector. Refer to the patient Instructions for Use for detailed instructions. Use exactly as directed. Tell your care team immediately if you suspect that the On-body Injector may not have performed as intended or if you suspect the use of the On-body Injector resulted in a missed or partial dose. It is important that you put your used needles and syringes in a special sharps container. Do not put them in a trash can. If you do not have a sharps container, call your pharmacist or care team to get one. Talk to your care team about the use of this medication in children. While this medication may be prescribed for selected conditions, precautions do apply. Overdosage: If you think you have taken too much of this medicine contact a poison control center or emergency room at once. NOTE: This medicine is only for you. Do not share this medicine with others. What if I miss a dose? It is important not to miss your dose. Call your care team if you miss your dose. If you miss a dose due to an On-body  Injector failure or leakage, a new dose should be administered as soon as possible using a single prefilled syringe for manual use. What may interact with this medication? Interactions have not been studied. This list may not describe all possible interactions. Give your health care provider a list of all the medicines, herbs, non-prescription drugs, or dietary supplements you use. Also tell them if you smoke, drink alcohol, or use illegal drugs. Some items may interact with your medicine. What should I watch for while using this medication? Your condition will be monitored carefully while you are receiving this medication. You may need blood work done while you are taking this medication. Talk to your care team about your risk of cancer. You may be more at risk for certain types of cancer if you take this medication. If you are going to need a MRI, CT scan, or other procedure, tell your care team that you are using this medication (On-Body Injector only). What side effects may I notice from receiving this medication? Side effects that you should report to your care team as soon  as possible: Allergic reactions--skin rash, itching, hives, swelling of the face, lips, tongue, or throat Capillary leak syndrome--stomach or muscle pain, unusual weakness or fatigue, feeling faint or lightheaded, decrease in the amount of urine, swelling of the ankles, hands, or feet, trouble breathing High white blood cell level--fever, fatigue, trouble breathing, night sweats, change in vision, weight loss Inflammation of the aorta--fever, fatigue, back, chest, or stomach pain, severe headache Kidney injury (glomerulonephritis)--decrease in the amount of urine, red or dark Veasna Santibanez urine, foamy or bubbly urine, swelling of the ankles, hands, or feet Shortness of breath or trouble breathing Spleen injury--pain in upper left stomach or shoulder Unusual bruising or bleeding Side effects that usually do not require medical  attention (report to your care team if they continue or are bothersome): Bone pain Pain in the hands or feet This list may not describe all possible side effects. Call your doctor for medical advice about side effects. You may report side effects to FDA at 1-800-FDA-1088. Where should I keep my medication? Keep out of the reach of children. If you are using this medication at home, you will be instructed on how to store it. Throw away any unused medication after the expiration date on the label. NOTE: This sheet is a summary. It may not cover all possible information. If you have questions about this medicine, talk to your doctor, pharmacist, or health care provider.  2024 Elsevier/Gold Standard (2021-10-18 00:00:00)        To help prevent nausea and vomiting after your treatment, we encourage you to take your nausea medication as directed.  BELOW ARE SYMPTOMS THAT SHOULD BE REPORTED IMMEDIATELY: *FEVER GREATER THAN 100.4 F (38 C) OR HIGHER *CHILLS OR SWEATING *NAUSEA AND VOMITING THAT IS NOT CONTROLLED WITH YOUR NAUSEA MEDICATION *UNUSUAL SHORTNESS OF BREATH *UNUSUAL BRUISING OR BLEEDING *URINARY PROBLEMS (pain or burning when urinating, or frequent urination) *BOWEL PROBLEMS (unusual diarrhea, constipation, pain near the anus) TENDERNESS IN MOUTH AND THROAT WITH OR WITHOUT PRESENCE OF ULCERS (sore throat, sores in mouth, or a toothache) UNUSUAL RASH, SWELLING OR PAIN  UNUSUAL VAGINAL DISCHARGE OR ITCHING   Items with * indicate a potential emergency and should be followed up as soon as possible or go to the Emergency Department if any problems should occur.  Please show the CHEMOTHERAPY ALERT CARD or IMMUNOTHERAPY ALERT CARD at check-in to the Emergency Department and triage nurse.  Should you have questions after your visit or need to cancel or reschedule your appointment, please contact [facility] [phone]  and follow the prompts.  Office hours are 8:00  a.m. to 4:30 p.m. Monday - Friday. Please note that voicemails left after 4:00 p.m. may not be returned until the following business day.  We are closed weekends and major holidays. You have access to a nurse at all times for urgent questions. Please call the main number to the clinic [phone] and follow the prompts.  For any non-urgent questions, you may also contact your provider using MyChart. We now offer e-Visits for anyone 84 and older to request care online for non-urgent symptoms. For details visit [email or web address].   Also download the MyChart app! Go to the app store, search "MyChart", open the app, select , and log in with your MyChart username and password.

## 2023-06-18 NOTE — Progress Notes (Signed)
Patient has met deductible will no longer be receiving Rituxan as drug replacement thru December 01, 2023.  Change order back to Ruxience 375 mg/m2 per PA from insurance.  Pryor Ochoa, PharmD

## 2023-06-22 NOTE — Progress Notes (Signed)
[facility] [address] [place] [address]    Clinic Day:  06/23/2023  Referring physician: Doreatha Massed, MD  Patient Care Team: Doreatha Massed, MD as PCP - General (Hematology) Doreatha Massed, MD as Medical Oncologist (Medical Oncology) Therese Sarah, RN as Oncology Nurse Navigator (Medical Oncology)   ASSESSMENT & PLAN:   Assessment: 1.  Chronic lymphocytic leukemia: - Flow cytometry (01/26/2019): Monoclonal B-cell population consistent with CLL. - CT chest (12/12/2022): Large right hilar mass/bulky adenopathy measuring 7.5 x 6.4 cm encasing the right mainstem bronchus and lobar branches.  Additional mediastinal adenopathy, axillary and lower cervical adenopathy. - CT [facility] (12/12/2022): Severe splenomegaly measuring 21.5 cm.  Prominent retroperitoneal lymph nodes, left retroperitoneal nodes measuring 1.6 x 1.0 cm.  Subcapsular hypodense lesion of the peripheral liver dome measuring 1.6 x 1.1 cm.  Additional subcentimeter hypodense lesions too small to characterize. - No B symptoms.  Patient plays golf 3-4 times per week.  No recurrent infections. - CLL FISH panel with 13 q. deletion - T p53 mutation negative - IGHV hyper mutation present. - PET scan (01/08/2023): Marked right hilar adenopathy with lymph node conglomerate/large lymph node measuring 5 cm with SUV of 12.3.  There is hypermetabolic adenopathy above and below diaphragm with splenomegaly. - Bronchoscopy (01/28/2023) and biopsy of the right hilar mass by Dr. Tonia Brooms - Pathology: CD5 positive B-cell lymphoproliferative disorder, cyclin D1 positive, favoring mantle cell lymphoma.  FISH for t(11:14) positive indicating mantle cell lymphoma. - Cycle 1 of Bendamustine and rituximab started on 03/02/2023   2.  Social/family history: - Lives at home with his girlfriend.  Independent of ADLs and IADLs. - He retired 8 years ago after working at [employer] in [place].  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  Quit smoking in 1992.  Smoked 1 pack/day for 10 years. - Sister had breast cancer.  Another sister had ovarian and stomach cancer.  Father had colon cancer.  Mother had CLL.  Paternal aunt had lung cancer.  Another paternal aunt had breast cancer.    Plan: 1.  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  Splenomegaly stable. - He has completed 4 cycles of Bendamustine and rituximab. - Denied any fevers or infections.  He continues to be active and play golf on the weekends. - Reviewed labs today: Normal LFTs and creatinine.  CBC shows mild leukopenia and thrombocytopenia. - I would recommend proceeding with cycle 5 today with Bendamustine reduced dose at 70 mg/m.  RTC 4 weeks for follow-up for cycle 6.   2.  TLS prophylaxis: - Uric acid is normal at 5.5.  May stop allopurinol.    No orders of the defined types were placed in this encounter.     Alben Deeds Teague,acting as a Neurosurgeon for Doreatha Massed, MD.,have documented all relevant documentation on the behalf of Doreatha Massed, MD,as directed by  Doreatha Massed, MD while in the presence of Doreatha Massed, MD.  I, Doreatha Massed MD, have reviewed the above documentation for accuracy and completeness, and I agree with the above.    Doreatha Massed, MD   7/23/202411:19 AM  CHIEF COMPLAINT:   Diagnosis: mantle cell lymphoma   Cancer Staging  Mantle cell lymphoma (HCC) Staging form: Hodgkin and Non-Hodgkin Lymphoma, AJCC 8th Edition - Clinical stage from 02/11/2023: Stage III (Mantle cell lymphoma) - Signed by Doreatha Massed,  MD on 02/11/2023    Prior Therapy: none  Current Therapy:  Bendamustine and rituximab every 28 days for 6 cycles    HISTORY OF PRESENT ILLNESS:   Oncology History  Mantle cell lymphoma (HCC)  02/11/2023  Initial Diagnosis   Mantle cell lymphoma (HCC)   02/11/2023 Cancer Staging   Staging form: Hodgkin and Non-Hodgkin Lymphoma, AJCC 8th Edition - Clinical stage from 02/11/2023: Stage III (Mantle cell lymphoma) - Signed by Doreatha Massed, MD on 02/11/2023 Histopathologic type: Mantle cell lymphoma (Includes all variants: blastic, pleomorphic, small cell) Stage prefix: Initial diagnosis   03/02/2023 -  Chemotherapy   Patient is on Treatment Plan : NON-HODGKINS LYMPHOMA Rituximab D1 + Bendamustine D1,2 q28d x 6 cycles        INTERVAL HISTORY:   Travis Wood is a 72 y.o. male presenting to clinic today for follow up of mantle cell lymphoma. He was last seen by me on 05/26/23.  Today, he states that he is doing well overall. His appetite level is at 100%. His energy level is at 90%.  PAST MEDICAL HISTORY:   Past Medical History: Past Medical History:  Diagnosis Date  . Anemia   . Cataract   . CLL (chronic lymphocytic leukemia) (HCC)   . Erectile dysfunction   . History of bronchitis   . History of colon polyps   . Hyperlipemia   . Osteoarthritis   . Shortness of breath dyspnea    with exertion    Surgical History: Past Surgical History:  Procedure Laterality Date  . APPENDECTOMY    . BRONCHIAL NEEDLE ASPIRATION BIOPSY  01/28/2023   Procedure: BRONCHIAL NEEDLE ASPIRATION BIOPSIES;  Surgeon: Josephine Igo, DO;  Location: [facility];  Service: Cardiopulmonary;;  . CATARACT EXTRACTION W/PHACO Left 10/15/2015   Procedure: CATARACT EXTRACTION PHACO AND INTRAOCULAR LENS PLACEMENT LEFT EYE;  Surgeon: Gemma Payor, MD;  Location: [facility];  Service: Ophthalmology;  Laterality: Left;  CDE:12.20  . CATARACT EXTRACTION W/PHACO Right 11/12/2015   Procedure: CATARACT EXTRACTION PHACO AND INTRAOCULAR LENS PLACEMENT RIGHT EYE:  CDE:  8.99;  Surgeon: Gemma Payor, MD;  Location: [facility];  Service: Ophthalmology;  Laterality: Right;  . IR IMAGING GUIDED PORT INSERTION  03/04/2023  . KNEE SURGERY      Left x 2   . MASS EXCISION N/A 09/12/2015   Procedure: EXCISION SOFT TISSUE NEOPLASM (6 CM), BACK;  Surgeon: Franky Macho, MD;  Location: [facility];  Service: General;  Laterality: N/A;  . 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   Procedure: TOTAL KNEE ARTHROPLASTY;  Surgeon: Durene Romans, MD;  Location: [facility];  Service: Orthopedics;  Laterality: Right;  70 mins  . VIDEO BRONCHOSCOPY WITH ENDOBRONCHIAL ULTRASOUND Bilateral 01/28/2023   Procedure: VIDEO BRONCHOSCOPY WITH ENDOBRONCHIAL ULTRASOUND;  Surgeon: Josephine Igo, DO;  Location: [facility];  Service: Cardiopulmonary;  Laterality: Bilateral;    Social History: Social History   Socioeconomic History  . Marital status: Media planner    Spouse name: Not on file  . Number of children: 2  . Years of education: 12 years  . Highest education level: 12th grade  Occupational History  . Occupation: RETIRED  Tobacco Use  . Smoking status: Former    Current packs/day: 0.00    Average packs/day: 1 pack/day for 10.0 years (10.0 ttl pk-yrs)    Types: Cigarettes, Pipe    Start date: 09/09/1981    Quit date: 09/10/1991    Years since quitting: 31.8  . Smokeless tobacco: Never  . Tobacco comments:  Smoked a pipe occasionally for 3 years.  Vaping Use  . Vaping status: Never Used  Substance and Sexual Activity  . Alcohol use: Not Currently    Alcohol/week: 1.0 standard drink of alcohol    Types: 1 Cans of beer per week  . Drug use: Yes    Frequency: 1.0 times per week    Types: Marijuana  . Sexual activity: Yes  Other Topics Concern  . Not on file  Social History Narrative   2 sons who live close by   Lives with girlfriend   Social Determinants of Health   Financial Resource Strain: Low Risk  (11/06/2022)   Overall Financial Resource Strain (CARDIA)   . Difficulty of Paying Living Expenses: Not hard at all  Food Insecurity: No Food Insecurity (04/20/2023)   Hunger Vital Sign   . Worried About Programme researcher, broadcasting/film/video in the  Last Year: Never true   . Ran Out of Food in the Last Year: Never true  Transportation Needs: No Transportation Needs (04/20/2023)   PRAPARE - Transportation   . Lack of Transportation (Medical): No   . Lack of Transportation (Non-Medical): No  Physical Activity: Sufficiently Active (11/06/2022)   Exercise Vital Sign   . Days of Exercise per Week: 7 days   . Minutes of Exercise per Session: 30 min  Stress: No Stress Concern Present (11/06/2022)   [employer] of Occupational Health - Occupational Stress Questionnaire   . Feeling of Stress : Not at all  Social Connections: Socially Integrated (11/06/2022)   Social Connection and Isolation Panel [NHANES]   . Frequency of Communication with Friends and Family: More than three times a week   . Frequency of Social Gatherings with Friends and Family: More than three times a week   . Attends Religious Services: 1 to 4 times per year   . Active Member of Clubs or Organizations: Yes   . Attends Banker Meetings: More than 4 times per year   . Marital Status: Living with partner  Intimate Partner Violence: Not At Risk (04/20/2023)   Humiliation, Afraid, Rape, and Kick questionnaire   . Fear of Current or Ex-Partner: No   . Emotionally Abused: No   . Physically Abused: No   . Sexually Abused: No    Family History: Family History  Problem Relation Age of Onset  . Colon cancer Father        age 61   . Colon polyps Father   . Heart disease Father        Heart failure  . Heart disease Mother   . Cancer Sister        Uterine, and ovarian  . Hypertension Brother   . Obesity Sister   . Heart disease Maternal Grandmother   . Esophageal cancer Neg Hx   . Liver cancer Neg Hx   . Pancreatic cancer Neg Hx   . Rectal cancer Neg Hx   . Stomach cancer Neg Hx     Current Medications:  Current Outpatient Medications:  .  allopurinol (ZYLOPRIM) 300 MG tablet, Take 1 tablet (300 mg total) by mouth daily., Disp: 30 tablet, Rfl:  3 .  antiseptic oral rinse (BIOTENE) LIQD, 15 mLs by Mouth Rinse route as needed for dry mouth., Disp: , Rfl:  .  atorvastatin (LIPITOR) 40 MG tablet, Take 1 tablet (40 mg total) by mouth daily., Disp: 90 tablet, Rfl: 3 .  BENDAMUSTINE HCL IV, Inject into the vein every 28 (twenty-eight) days. Days  1 and 2 every 28 days, Disp: , Rfl:  .  ferrous sulfate 325 (65 FE) MG tablet, Take 325 mg by mouth daily with breakfast., Disp: , Rfl:  .  lidocaine-prilocaine (EMLA) cream, Apply to affected area once, Disp: 30 g, Rfl: 3 .  Multiple Vitamin (MULTIVITAMIN WITH MINERALS) TABS tablet, Take 1 tablet by mouth daily., Disp: , Rfl:  .  OLIVE LEAF EXTRACT PO, Take 1 tablet by mouth in the morning and at bedtime., Disp: , Rfl:  .  ondansetron (ZOFRAN) 4 MG tablet, Take 1 tablet (4 mg total) by mouth every 8 (eight) hours as needed for nausea or vomiting., Disp: 20 tablet, Rfl: 0 .  OVER THE COUNTER MEDICATION, Take 1 each by mouth 5 (five) times daily. [name], Disp: , Rfl:  .  prochlorperazine (COMPAZINE) 10 MG tablet, Take 1 tablet (10 mg total) by mouth every 6 (six) hours as needed for nausea or vomiting., Disp: 30 tablet, Rfl: 1 .  riTUXimab (RITUXAN IV), Inject into the vein every 28 (twenty-eight) days., Disp: , Rfl:  .  Vitamin D, Ergocalciferol, (DRISDOL) 1.25 MG (50000 UNIT) CAPS capsule, Take 1 capsule (50,000 Units total) by mouth every 7 (seven) days., Disp: 13 capsule, Rfl: 3 No current facility-administered medications for this visit.  Facility-Administered Medications Ordered in Other Visits:  .  bendamustine (BENDEKA) 152.5 mg in sodium chloride 0.9 % 50 mL (2.7184 mg/mL) chemo infusion, 70 mg/m2 (Treatment Plan Recorded), Intravenous, Once, Doreatha Massed, MD .  heparin lock flush 100 unit/mL, 500 Units, Intracatheter, Once PRN, Doreatha Massed, MD .  riTUXimab-pvvr (RUXIENCE) 800 mg in sodium chloride 0.9 % 250 mL (2.4242 mg/mL) infusion, 375 mg/m2 (Treatment Plan Recorded),  Intravenous, Once, Doreatha Massed, MD .  sodium chloride flush (NS) 0.9 % injection 10 mL, 10 mL, Intracatheter, PRN, Doreatha Massed, MD   Allergies: No Known Allergies  REVIEW OF SYSTEMS:   Review of Systems  Constitutional:  Negative for chills, fatigue and fever.  HENT:   Negative for lump/mass, mouth sores, nosebleeds, sore throat and trouble swallowing.   Eyes:  Negative for eye problems.  Respiratory:  Negative for cough and shortness of breath.   Cardiovascular:  Negative for chest pain, leg swelling and palpitations.  Gastrointestinal:  Negative for abdominal pain, constipation, diarrhea, nausea and vomiting.  Genitourinary:  Negative for bladder incontinence, difficulty urinating, dysuria, frequency, hematuria and nocturia.   Musculoskeletal:  Negative for arthralgias, back pain, flank pain, myalgias and neck pain.  Skin:  Negative for itching and rash.  Neurological:  Negative for dizziness, headaches and numbness.  Hematological:  Does not bruise/bleed easily.  Psychiatric/Behavioral:  Negative for depression, sleep disturbance and suicidal ideas. The patient is not nervous/anxious.   All other systems reviewed and are negative.    VITALS:   There were no vitals taken for this visit.  Wt Readings from Last 3 Encounters:  06/23/23 216 lb 9.6 oz (98.2 kg)  05/26/23 212 lb 3.2 oz (96.3 kg)  04/28/23 198 lb 6.4 oz (90 kg)    There is no height or weight on file to calculate BMI.  Performance status (ECOG): 1 - Symptomatic but completely ambulatory  PHYSICAL EXAM:   Physical Exam Vitals and nursing note reviewed. Exam conducted with a chaperone present.  Constitutional:      Appearance: Normal appearance.  Cardiovascular:     Rate and Rhythm: Normal rate and regular rhythm.     Pulses: Normal pulses.     Heart sounds: Normal heart  sounds.  Pulmonary:     Effort: Pulmonary effort is normal.     Breath sounds: Normal breath sounds.  Abdominal:      Palpations: Abdomen is soft. There is no hepatomegaly, splenomegaly or mass.     Tenderness: There is no abdominal tenderness.  Musculoskeletal:     Right lower leg: No edema.     Left lower leg: No edema.  Lymphadenopathy:     Cervical: No cervical adenopathy.     Right cervical: No superficial, deep or posterior cervical adenopathy.    Left cervical: No superficial, deep or posterior cervical adenopathy.     Upper Body:     Right upper body: No supraclavicular or axillary adenopathy.     Left upper body: No supraclavicular or axillary adenopathy.  Neurological:     General: No focal deficit present.     Mental Status: He is alert and oriented to person, place, and time.  Psychiatric:        Mood and Affect: Mood normal.        Behavior: Behavior normal.    LABS:      Latest Ref Rng & Units 06/23/2023    9:21 AM 05/26/2023    9:18 AM 04/28/2023    8:18 AM  CBC  WBC 4.0 - 10.5 K/uL 3.8  2.9  3.8   Hemoglobin 13.0 - 17.0 g/dL 16.1  09.6  04.5   Hematocrit 39.0 - 52.0 % 37.0  33.5  32.4   Platelets 150 - 400 K/uL 120  125  158       Latest Ref Rng & Units 06/23/2023    9:21 AM 05/26/2023    9:18 AM 04/28/2023    8:18 AM  CMP  Glucose 70 - 99 mg/dL 90  91  409   BUN 8 - 23 mg/dL 17  16  14    Creatinine 0.61 - 1.24 mg/dL 8.11  9.14  7.82   Sodium 135 - 145 mmol/L 139  137  139   Potassium 3.5 - 5.1 mmol/L 4.2  4.3  3.8   Chloride 98 - 111 mmol/L 105  103  102   CO2 22 - 32 mmol/L 27  29  24    Calcium 8.9 - 10.3 mg/dL 8.6  8.5  8.5   Total Protein 6.5 - 8.1 g/dL 5.8  5.5  5.6   Total Bilirubin 0.3 - 1.2 mg/dL 0.6  0.8  0.5   Alkaline Phos 38 - 126 U/L 105  100  85   AST 15 - 41 U/L 18  19  16    ALT 0 - 44 U/L 19  19  18       No results found for: "CEA1", "CEA" / No results found for: "CEA1", "CEA" Lab Results  Component Value Date   PSA1 0.6 06/06/2020   No results found for: "NFA213" No results found for: "CAN125"  Lab Results  Component Value Date   TOTALPROTELP  6.0 01/26/2019   ALBUMINELP 3.9 01/26/2019   A1GS 0.2 01/26/2019   A2GS 0.5 01/26/2019   BETS 0.9 01/26/2019   GAMS 0.5 01/26/2019   MSPIKE Not Observed 01/26/2019   SPEI Comment 01/26/2019   Lab Results  Component Value Date   TIBC 310 08/21/2021   FERRITIN 421 (H) 08/21/2021   IRONPCTSAT 20 08/21/2021   Lab Results  Component Value Date   LDH 120 06/23/2023   LDH 130 05/26/2023   LDH 134 02/26/2023     STUDIES:   No  results found.

## 2023-06-23 ENCOUNTER — Inpatient Hospital Stay: Payer: Medicare Other | Admitting: Hematology

## 2023-06-23 ENCOUNTER — Inpatient Hospital Stay: Payer: Medicare Other | Attending: Hematology

## 2023-06-23 ENCOUNTER — Inpatient Hospital Stay: Payer: Medicare Other

## 2023-06-23 VITALS — BP 127/59 | HR 60 | Temp 97.1°F | Resp 18 | Wt 216.6 lb

## 2023-06-23 DIAGNOSIS — C8318 Mantle cell lymphoma, lymph nodes of multiple sites: Secondary | ICD-10-CM | POA: Diagnosis not present

## 2023-06-23 DIAGNOSIS — Z79620 Long term (current) use of immunosuppressive biologic: Secondary | ICD-10-CM | POA: Diagnosis not present

## 2023-06-23 DIAGNOSIS — Z95828 Presence of other vascular implants and grafts: Secondary | ICD-10-CM

## 2023-06-23 DIAGNOSIS — Z5111 Encounter for antineoplastic chemotherapy: Secondary | ICD-10-CM | POA: Diagnosis present

## 2023-06-23 DIAGNOSIS — C9110 Chronic lymphocytic leukemia of B-cell type not having achieved remission: Secondary | ICD-10-CM | POA: Insufficient documentation

## 2023-06-23 DIAGNOSIS — Z87891 Personal history of nicotine dependence: Secondary | ICD-10-CM | POA: Diagnosis not present

## 2023-06-23 DIAGNOSIS — Z5112 Encounter for antineoplastic immunotherapy: Secondary | ICD-10-CM | POA: Diagnosis present

## 2023-06-23 DIAGNOSIS — C8310 Mantle cell lymphoma, unspecified site: Secondary | ICD-10-CM | POA: Diagnosis not present

## 2023-06-23 DIAGNOSIS — Z5189 Encounter for other specified aftercare: Secondary | ICD-10-CM | POA: Insufficient documentation

## 2023-06-23 LAB — CBC WITH DIFFERENTIAL/PLATELET
Abs Immature Granulocytes: 0.01 10*3/uL (ref 0.00–0.07)
Basophils Absolute: 0 10*3/uL (ref 0.0–0.1)
Basophils Relative: 1 %
Eosinophils Absolute: 0.1 10*3/uL (ref 0.0–0.5)
Eosinophils Relative: 1 %
HCT: 37 % — ABNORMAL LOW (ref 39.0–52.0)
Hemoglobin: 12.3 g/dL — ABNORMAL LOW (ref 13.0–17.0)
Immature Granulocytes: 0 %
Lymphocytes Relative: 27 %
Lymphs Abs: 1 10*3/uL (ref 0.7–4.0)
MCH: 31.1 pg (ref 26.0–34.0)
MCHC: 33.2 g/dL (ref 30.0–36.0)
MCV: 93.7 fL (ref 80.0–100.0)
Monocytes Absolute: 0.3 10*3/uL (ref 0.1–1.0)
Monocytes Relative: 9 %
Neutro Abs: 2.3 10*3/uL (ref 1.7–7.7)
Neutrophils Relative %: 62 %
Platelets: 120 10*3/uL — ABNORMAL LOW (ref 150–400)
RBC: 3.95 MIL/uL — ABNORMAL LOW (ref 4.22–5.81)
RDW: 14.1 % (ref 11.5–15.5)
WBC: 3.8 10*3/uL — ABNORMAL LOW (ref 4.0–10.5)
nRBC: 0 % (ref 0.0–0.2)

## 2023-06-23 LAB — COMPREHENSIVE METABOLIC PANEL
ALT: 19 U/L (ref 0–44)
AST: 18 U/L (ref 15–41)
Albumin: 3.7 g/dL (ref 3.5–5.0)
Alkaline Phosphatase: 105 U/L (ref 38–126)
Anion gap: 7 (ref 5–15)
BUN: 17 mg/dL (ref 8–23)
CO2: 27 mmol/L (ref 22–32)
Calcium: 8.6 mg/dL — ABNORMAL LOW (ref 8.9–10.3)
Chloride: 105 mmol/L (ref 98–111)
Creatinine, Ser: 0.65 mg/dL (ref 0.61–1.24)
GFR, Estimated: >60 mL/min (ref >60)
Glucose, Bld: 90 mg/dL (ref 70–99)
Potassium: 4.2 mmol/L (ref 3.5–5.1)
Sodium: 139 mmol/L (ref 135–145)
Total Bilirubin: 0.6 mg/dL (ref 0.3–1.2)
Total Protein: 5.8 g/dL — ABNORMAL LOW (ref 6.5–8.1)

## 2023-06-23 LAB — LACTATE DEHYDROGENASE: LDH: 120 U/L (ref 98–192)

## 2023-06-23 LAB — MAGNESIUM: Magnesium: 2.1 mg/dL (ref 1.7–2.4)

## 2023-06-23 LAB — URIC ACID: Uric Acid, Serum: 5.5 mg/dL (ref 3.7–8.6)

## 2023-06-23 MED ORDER — SODIUM CHLORIDE 0.9% FLUSH
10.0000 mL | INTRAVENOUS | Status: DC | PRN
  Administered 2023-06-23: 10 mL

## 2023-06-23 MED ORDER — FAMOTIDINE IN NACL 20-0.9 MG/50ML-% IV SOLN
20.0000 mg | Freq: Once | INTRAVENOUS | Status: AC
  Administered 2023-06-23: 20 mg via INTRAVENOUS
  Filled 2023-06-23: qty 50

## 2023-06-23 MED ORDER — CETIRIZINE HCL 10 MG/ML IV SOLN
10.0000 mg | Freq: Once | INTRAVENOUS | Status: AC
  Administered 2023-06-23: 10 mg via INTRAVENOUS
  Filled 2023-06-23: qty 1

## 2023-06-23 MED ORDER — ACETAMINOPHEN 325 MG PO TABS
650.0000 mg | ORAL_TABLET | Freq: Once | ORAL | Status: AC
  Administered 2023-06-23: 650 mg via ORAL
  Filled 2023-06-23: qty 2

## 2023-06-23 MED ORDER — SODIUM CHLORIDE 0.9 % IV SOLN
10.0000 mg | Freq: Once | INTRAVENOUS | Status: AC
  Administered 2023-06-23: 10 mg via INTRAVENOUS
  Filled 2023-06-23: qty 10

## 2023-06-23 MED ORDER — PALONOSETRON HCL INJECTION 0.25 MG/5ML
0.2500 mg | Freq: Once | INTRAVENOUS | Status: AC
  Administered 2023-06-23: 0.25 mg via INTRAVENOUS
  Filled 2023-06-23: qty 5

## 2023-06-23 MED ORDER — SODIUM CHLORIDE 0.9 % IV SOLN
375.0000 mg/m2 | Freq: Once | INTRAVENOUS | Status: AC
  Administered 2023-06-23: 800 mg via INTRAVENOUS
  Filled 2023-06-23: qty 50

## 2023-06-23 MED ORDER — SODIUM CHLORIDE 0.9% FLUSH
10.0000 mL | INTRAVENOUS | Status: DC | PRN
  Administered 2023-06-23: 10 mL via INTRAVENOUS

## 2023-06-23 MED ORDER — SODIUM CHLORIDE 0.9 % IV SOLN: Freq: Once | INTRAVENOUS | Status: AC

## 2023-06-23 MED ORDER — HEPARIN SOD (PORK) LOCK FLUSH 100 UNIT/ML IV SOLN
500.0000 [IU] | Freq: Once | INTRAVENOUS | Status: AC | PRN
  Administered 2023-06-23: 500 [IU]

## 2023-06-23 MED ORDER — SODIUM CHLORIDE 0.9 % IV SOLN
70.0000 mg/m2 | Freq: Once | INTRAVENOUS | Status: AC
  Administered 2023-06-23: 152.5 mg via INTRAVENOUS
  Filled 2023-06-23: qty 6.1

## 2023-06-23 NOTE — Progress Notes (Signed)
Patient has been examined by Dr. Katragadda. Vital signs and labs have been reviewed by MD - ANC, Creatinine, LFTs, hemoglobin, and platelets are within treatment parameters per M.D. - pt may proceed with treatment.  Primary RN and pharmacy notified.  

## 2023-06-23 NOTE — Patient Instructions (Signed)
[facility]  Discharge Instructions: Thank you for choosing [facility] to provide your oncology and hematology care.  If you have a lab appointment with the Cancer Center - please note that after April 8th, 2024, all labs will be drawn in the cancer center.  You do not have to check in or register with the main entrance as you have in the past but will complete your check-in in the cancer center.  Wear comfortable clothing and clothing appropriate for easy access to any Portacath or PICC line.   We strive to give you quality time with your provider. You may need to reschedule your appointment if you arrive late (15 or more minutes).  Arriving late affects you and other patients whose appointments are after yours.  Also, if you miss three or more appointments without notifying the office, you may be dismissed from the clinic at the provider's discretion.      For prescription refill requests, have your pharmacy contact our office and allow 72 hours for refills to be completed.    Today you received the following chemotherapy and/or immunotherapy agents rituxan and bendamustine.       To help prevent nausea and vomiting after your treatment, we encourage you to take your nausea medication as directed.  BELOW ARE SYMPTOMS THAT SHOULD BE REPORTED IMMEDIATELY: *FEVER GREATER THAN 100.4 F (38 C) OR HIGHER *CHILLS OR SWEATING *NAUSEA AND VOMITING THAT IS NOT CONTROLLED WITH YOUR NAUSEA MEDICATION *UNUSUAL SHORTNESS OF BREATH *UNUSUAL BRUISING OR BLEEDING *URINARY PROBLEMS (pain or burning when urinating, or frequent urination) *BOWEL PROBLEMS (unusual diarrhea, constipation, pain near the anus) TENDERNESS IN MOUTH AND THROAT WITH OR WITHOUT PRESENCE OF ULCERS (sore throat, sores in mouth, or a toothache) UNUSUAL RASH, SWELLING OR PAIN  UNUSUAL VAGINAL DISCHARGE OR ITCHING   Items with * indicate a potential emergency and should be followed up as soon as  possible or go to the Emergency Department if any problems should occur.  Please show the CHEMOTHERAPY ALERT CARD or IMMUNOTHERAPY ALERT CARD at check-in to the Emergency Department and triage nurse.  Should you have questions after your visit or need to cancel or reschedule your appointment, please contact [facility] [phone]  and follow the prompts.  Office hours are 8:00 a.m. to 4:30 p.m. Monday - Friday. Please note that voicemails left after 4:00 p.m. may not be returned until the following business day.  We are closed weekends and major holidays. You have access to a nurse at all times for urgent questions. Please call the main number to the clinic [phone] and follow the prompts.  For any non-urgent questions, you may also contact your provider using MyChart. We now offer e-Visits for anyone 5 and older to request care online for non-urgent symptoms. For details visit [email or web address].   Also download the MyChart app! Go to the app store, search "MyChart", open the app, select [place], and log in with your MyChart username and password.

## 2023-06-23 NOTE — Progress Notes (Signed)

## 2023-06-23 NOTE — Progress Notes (Signed)
Patients port flushed without difficulty.  Good blood return noted with no bruising or swelling noted at site.  Patient remains accessed for treatment.  

## 2023-06-23 NOTE — Patient Instructions (Signed)

## 2023-06-24 ENCOUNTER — Inpatient Hospital Stay: Payer: Medicare Other

## 2023-06-24 VITALS — BP 104/59 | HR 57 | Temp 97.8°F | Resp 18

## 2023-06-24 DIAGNOSIS — C8318 Mantle cell lymphoma, lymph nodes of multiple sites: Secondary | ICD-10-CM

## 2023-06-24 DIAGNOSIS — Z5112 Encounter for antineoplastic immunotherapy: Secondary | ICD-10-CM | POA: Diagnosis not present

## 2023-06-24 MED ORDER — SODIUM CHLORIDE 0.9 % IV SOLN
10.0000 mg | Freq: Once | INTRAVENOUS | Status: AC
  Administered 2023-06-24: 10 mg via INTRAVENOUS
  Filled 2023-06-24: qty 10

## 2023-06-24 MED ORDER — PEGFILGRASTIM 6 MG/0.6ML ~~LOC~~ PSKT
6.0000 mg | PREFILLED_SYRINGE | Freq: Once | SUBCUTANEOUS | Status: AC
  Administered 2023-06-24: 6 mg via SUBCUTANEOUS
  Filled 2023-06-24: qty 0.6

## 2023-06-24 MED ORDER — HEPARIN SOD (PORK) LOCK FLUSH 100 UNIT/ML IV SOLN
500.0000 [IU] | Freq: Once | INTRAVENOUS | Status: AC | PRN
  Administered 2023-06-24: 500 [IU]

## 2023-06-24 MED ORDER — SODIUM CHLORIDE 0.9 % IV SOLN
70.0000 mg/m2 | Freq: Once | INTRAVENOUS | Status: AC
  Administered 2023-06-24: 152.5 mg via INTRAVENOUS
  Filled 2023-06-24: qty 6.1

## 2023-06-24 MED ORDER — SODIUM CHLORIDE 0.9 % IV SOLN: Freq: Once | INTRAVENOUS | Status: AC

## 2023-06-24 MED ORDER — SODIUM CHLORIDE 0.9% FLUSH
10.0000 mL | INTRAVENOUS | Status: DC | PRN
  Administered 2023-06-24: 10 mL

## 2023-06-24 NOTE — Patient Instructions (Signed)
[facility]  Discharge Instructions: Thank you for choosing [facility] to provide your oncology and hematology care.  If you have a lab appointment with the Cancer Center - please note that after April 8th, 2024, all labs will be drawn in the cancer center.  You do not have to check in or register with the main entrance as you have in the past but will complete your check-in in the cancer center.  Wear comfortable clothing and clothing appropriate for easy access to any Portacath or PICC line.   We strive to give you quality time with your provider. You may need to reschedule your appointment if you arrive late (15 or more minutes).  Arriving late affects you and other patients whose appointments are after yours.  Also, if you miss three or more appointments without notifying the office, you may be dismissed from the clinic at the provider's discretion.      For prescription refill requests, have your pharmacy contact our office and allow 72 hours for refills to be completed.    Today you received the following chemotherapy and/or immunotherapy agents Bendamustine Injection What is this medication? BENDAMUSTINE (BEN da MUS teen) treats leukemia and lymphoma. It works by slowing down the growth of cancer cells. This medicine may be used for other purposes; ask your health care provider or pharmacist if you have questions. COMMON BRAND NAME(S): Marilynne Halsted, VIVIMUSTA What should I tell my care team before I take this medication? They need to know if you have any of these conditions: Infection, especially a viral infection, such as chickenpox, cold sores, herpes Kidney disease Liver disease An unusual or allergic reaction to bendamustine, mannitol, other medications, foods, dyes, or preservatives Pregnant or trying to get pregnant Breast-feeding How should I use this medication? This medication is injected into a vein. It is given by your care  team in a hospital or clinic setting. Talk to your care team about the use of this medication in children. Special care may be needed. Overdosage: If you think you have taken too much of this medicine contact a poison control center or emergency room at once. NOTE: This medicine is only for you. Do not share this medicine with others. What if I miss a dose? Keep appointments for follow-up doses. It is important not to miss your dose. Call your care team if you are unable to keep an appointment. What may interact with this medication? Do not take this medication with any of the following: Clozapine This medication may also interact with the following: Atazanavir Cimetidine Ciprofloxacin Enoxacin Fluvoxamine Medications for seizures, such as carbamazepine, phenobarbital Mexiletine Rifampin Tacrine Thiabendazole Zileuton This list may not describe all possible interactions. Give your health care provider a list of all the medicines, herbs, non-prescription drugs, or dietary supplements you use. Also tell them if you smoke, drink alcohol, or use illegal drugs. Some items may interact with your medicine. What should I watch for while using this medication? Visit your care team for regular checks on your progress. This medication may make you feel generally unwell. This is not uncommon, as chemotherapy can affect healthy cells as well as cancer cells. Report any side effects. Continue your course of treatment even though you feel ill unless your care team tells you to stop. You may need blood work while taking this medication. This medication may increase your risk of getting an infection. Call your care team for advice if you get a fever,  chills, sore throat, or other symptoms of a cold or flu. Do not treat yourself. Try to avoid being around people who are sick. This medication may cause serious skin reactions. They can happen weeks to months after starting the medication. Contact your care  team right away if you notice fevers or flu-like symptoms with a rash. The rash may be red or purple and then turn into blisters or peeling of the skin. You may also notice a red rash with swelling of the face, lips, or lymph nodes in your neck or under your arms. In some patients, this medication may cause a serious brain infection that may cause death. If you have any problems seeing, thinking, speaking, walking, or standing, tell your care team right away. If you cannot reach your care team, urgently seek other source of medical care. This medication may increase your risk to bruise or bleed. Call your care team if you notice any unusual bleeding. Talk to your care team about your risk of cancer. You may be more at risk for certain types of cancer if you take this medication. Talk to your care team about your risk of skin cancer. You may be more at risk for skin cancer if you take this medication. Talk to your care team if you or your partner wish to become pregnant or think either of you might be pregnant. This medication can cause serious birth defects if taken during pregnancy or for up to 6 months after the last dose. A negative pregnancy test is required before starting this medication. A reliable form of contraception is recommended while taking this medication and for 6 months after the last dose. Talk to your care team about reliable forms of contraception. Wear a condom while taking this medication and for at least 3 months after the last dose. Do not breast-feed while taking this medication or for at least 1 week after the last dose. This medication may cause infertility. Talk to your care team if you are concerned about your fertility. What side effects may I notice from receiving this medication? Side effects that you should report to your care team as soon as possible: Allergic reactions--skin rash, itching, hives, swelling of the face, lips, tongue, or throat Infection--fever, chills,  cough, sore throat, wounds that don't heal, pain or trouble when passing urine, general feeling of discomfort or being unwell Infusion reactions--chest pain, shortness of breath or trouble breathing, feeling faint or lightheaded Liver injury--right upper belly pain, loss of appetite, nausea, light-colored stool, dark yellow or brown urine, yellowing skin or eyes, unusual weakness or fatigue Low red blood cell level--unusual weakness or fatigue, dizziness, headache, trouble breathing Painful swelling, warmth, or redness of the skin, blisters or sores at the infusion site Rash, fever, and swollen lymph nodes Redness, blistering, peeling, or loosening of the skin, including inside the mouth Tumor lysis syndrome (TLS)--nausea, vomiting, diarrhea, decrease in the amount of urine, dark urine, unusual weakness or fatigue, confusion, muscle pain or cramps, fast or irregular heartbeat, joint pain Unusual bruising or bleeding Side effects that usually do not require medical attention (report to your care team if they continue or are bothersome): Diarrhea Fatigue Headache Loss of appetite Nausea Vomiting This list may not describe all possible side effects. Call your doctor for medical advice about side effects. You may report side effects to FDA at 1-800-FDA-1088. Where should I keep my medication? This medication is given in a hospital or clinic. It will not be stored at home.  NOTE: This sheet is a summary. It may not cover all possible information. If you have questions about this medicine, talk to your doctor, pharmacist, or health care provider.  2024 Elsevier/Gold Standard (2022-03-11 00:00:00)       To help prevent nausea and vomiting after your treatment, we encourage you to take your nausea medication as directed.  BELOW ARE SYMPTOMS THAT SHOULD BE REPORTED IMMEDIATELY: *FEVER GREATER THAN 100.4 F (38 C) OR HIGHER *CHILLS OR SWEATING *NAUSEA AND VOMITING THAT IS NOT CONTROLLED WITH YOUR  NAUSEA MEDICATION *UNUSUAL SHORTNESS OF BREATH *UNUSUAL BRUISING OR BLEEDING *URINARY PROBLEMS (pain or burning when urinating, or frequent urination) *BOWEL PROBLEMS (unusual diarrhea, constipation, pain near the anus) TENDERNESS IN MOUTH AND THROAT WITH OR WITHOUT PRESENCE OF ULCERS (sore throat, sores in mouth, or a toothache) UNUSUAL RASH, SWELLING OR PAIN  UNUSUAL VAGINAL DISCHARGE OR ITCHING   Items with * indicate a potential emergency and should be followed up as soon as possible or go to the Emergency Department if any problems should occur.  Please show the CHEMOTHERAPY ALERT CARD or IMMUNOTHERAPY ALERT CARD at check-in to the Emergency Department and triage nurse.  Should you have questions after your visit or need to cancel or reschedule your appointment, please contact [facility] [phone]  and follow the prompts.  Office hours are 8:00 a.m. to 4:30 p.m. Monday - Friday. Please note that voicemails left after 4:00 p.m. may not be returned until the following business day.  We are closed weekends and major holidays. You have access to a nurse at all times for urgent questions. Please call the main number to the clinic [phone] and follow the prompts.  For any non-urgent questions, you may also contact your provider using MyChart. We now offer e-Visits for anyone 54 and older to request care online for non-urgent symptoms. For details visit [email or web address].   Also download the MyChart app! Go to the app store, search "MyChart", open the app, select [place], and log in with your MyChart username and password.

## 2023-06-24 NOTE — Progress Notes (Signed)
Patient presents today for treatment. Bendeka C5 D2. Vital signs stable. Patient denies any side effects related to treatment. Patient denies any complaints today.   Bendeka given today per MD orders. Tolerated infusion without adverse affects. Vital signs stable. No complaints at this time.  OnPro placed on the patient's right arm. Discharged from clinic ambulatory in stable condition. Alert and oriented x 3. F/U with [facility] as scheduled.

## 2023-07-20 NOTE — Progress Notes (Signed)
[facility] [address] [place] [address]    Clinic Day:  07/21/23    Referring physician: Doreatha Massed, MD  Patient Care Team: Doreatha Massed, MD as PCP - General (Hematology) Doreatha Massed, MD as Medical Oncologist (Medical Oncology) Therese Sarah, RN as Oncology Nurse Navigator (Medical Oncology)   ASSESSMENT & PLAN:   Assessment: 1.  Chronic lymphocytic leukemia: - Flow cytometry (01/26/2019): Monoclonal B-cell population consistent with CLL. - CT chest (12/12/2022): Large right hilar mass/bulky adenopathy measuring 7.5 x 6.4 cm encasing the right mainstem bronchus and lobar branches.  Additional mediastinal adenopathy, axillary and lower cervical adenopathy. - CT [facility] (12/12/2022): Severe splenomegaly measuring 21.5 cm.  Prominent retroperitoneal lymph nodes, left retroperitoneal nodes measuring 1.6 x 1.0 cm.  Subcapsular hypodense lesion of the peripheral liver dome measuring 1.6 x 1.1 cm.  Additional subcentimeter hypodense lesions too small to characterize. - No B symptoms.  Patient plays golf 3-4 times per week.  No recurrent infections. - CLL FISH panel with 13 q. deletion - T p53 mutation negative - IGHV hyper mutation present. - PET scan (01/08/2023): Marked right hilar adenopathy with lymph node conglomerate/large lymph node measuring 5 cm with SUV of 12.3.  There is hypermetabolic adenopathy above and below diaphragm with splenomegaly. - Bronchoscopy (01/28/2023) and biopsy of the right hilar mass by Dr. Tonia Brooms - Pathology: CD5 positive B-cell lymphoproliferative disorder, cyclin D1 positive, favoring mantle cell lymphoma.  FISH for t(11:14) positive indicating mantle cell lymphoma. - Cycle 1 of Bendamustine and rituximab started on 03/02/2023   2.  Social/family history: - Lives at home with his girlfriend.  Independent of ADLs and IADLs. - He retired 8 years ago after working at [employer] in [place].  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  Quit smoking in 1992.  Smoked 1 pack/day for 10 years. - Sister had breast cancer.  Another sister had ovarian and stomach cancer.  Father had colon cancer.  Mother had CLL.  Paternal aunt had lung cancer.  Another paternal aunt had breast cancer.    Plan: 1.  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 - He tolerated last cycle very well.  Denied any infections or GI symptoms. - Reviewed labs today: Normal LFTs.  CBC grossly normal with mild leukopenia and thrombocytopenia.  LDH is normal.  Uric acid is 6.0. - Recommend proceeding with cycle 6 with dose reduction of Bendamustine. - RTC 6 weeks with PET scan. - Will discuss about maintenance rituximab at that time.   2.  TLS prophylaxis: - We have stopped allopurinol at last cycle.  Uric acid is 6.0 today.    Orders Placed This Encounter  Procedures   NM PET Image Restag (PS) Skull Base To Thigh    Standing Status:   Future    Standing Expiration Date:   07/19/2024    Order Specific Question:   If indicated for the ordered procedure, I authorize the administration of a radiopharmaceutical per Radiology protocol    Answer:   Yes    Order Specific Question:   Preferred imaging location?    Answer:   [facility]   CBC with Differential    Standing Status:   Future    Standing Expiration Date:   07/20/2024   Comprehensive metabolic panel    Standing Status:   Future    Standing Expiration Date:   07/20/2024   Lactate  dehydrogenase    Standing Status:   Future    Standing Expiration Date:   07/20/2024       Travis Wood,acting as a scribe for Doreatha Massed, MD.,have documented all relevant documentation on the behalf of Doreatha Massed, MD,as directed by  Doreatha Massed, MD while in the presence of Doreatha Massed, MD.  I, Doreatha Massed MD, have reviewed the above documentation for accuracy and completeness,  and I agree with the above.     Doreatha Massed, MD   8/20/20245:31 PM  CHIEF COMPLAINT:   Diagnosis: mantle cell lymphoma   Cancer Staging  Mantle cell lymphoma (HCC) Staging form: Hodgkin and Non-Hodgkin Lymphoma, AJCC 8th Edition - Clinical stage from 02/11/2023: Stage III (Mantle cell lymphoma) - Signed by Doreatha Massed, MD on 02/11/2023    Prior Therapy: none  Current Therapy:  Bendamustine and rituximab every 28 days for 6 cycles    HISTORY OF PRESENT ILLNESS:   Oncology History  Mantle cell lymphoma (HCC)  02/11/2023 Initial Diagnosis   Mantle cell lymphoma (HCC)   02/11/2023 Cancer Staging   Staging form: Hodgkin and Non-Hodgkin Lymphoma, AJCC 8th Edition - Clinical stage from 02/11/2023: Stage III (Mantle cell lymphoma) - Signed by Doreatha Massed, MD on 02/11/2023 Histopathologic type: Mantle cell lymphoma (Includes all variants: blastic, pleomorphic, small cell) Stage prefix: Initial diagnosis   03/02/2023 -  Chemotherapy   Patient is on Treatment Plan : NON-HODGKINS LYMPHOMA Rituximab D1 + Bendamustine D1,2 q28d x 6 cycles        INTERVAL HISTORY:   Travis Wood is a 72 y.o. male presenting to clinic today for follow up of mantle cell lymphoma. He was last seen by me on 06/23/23.  Today, he states that he is doing well overall. His appetite level is at 100%. His energy level is at 90%.  He notes improved energy levels. He denies any issues with his last treatment, including nausea, fever, infections, or ER visits.   PAST MEDICAL HISTORY:   Past Medical History: Past Medical History:  Diagnosis Date   Anemia    Cataract    CLL (chronic lymphocytic leukemia) (HCC)    Erectile dysfunction    History of bronchitis    History of colon polyps    Hyperlipemia    Osteoarthritis    Shortness of breath dyspnea    with exertion    Surgical History: Past Surgical History:  Procedure Laterality Date   APPENDECTOMY     BRONCHIAL NEEDLE ASPIRATION  BIOPSY  01/28/2023   Procedure: BRONCHIAL NEEDLE ASPIRATION BIOPSIES;  Surgeon: Josephine Igo, DO;  Location: [facility];  Service: Cardiopulmonary;;   CATARACT EXTRACTION W/PHACO Left 10/15/2015   Procedure: CATARACT EXTRACTION PHACO AND INTRAOCULAR LENS PLACEMENT LEFT EYE;  Surgeon: Gemma Payor, MD;  Location: [facility];  Service: Ophthalmology;  Laterality: Left;  CDE:12.20   CATARACT EXTRACTION W/PHACO Right 11/12/2015   Procedure: CATARACT EXTRACTION PHACO AND INTRAOCULAR LENS PLACEMENT RIGHT EYE:  CDE:  8.99;  Surgeon: Gemma Payor, MD;  Location: [facility];  Service: Ophthalmology;  Laterality: Right;   IR IMAGING GUIDED PORT INSERTION  03/04/2023   KNEE SURGERY     Left x 2    MASS EXCISION N/A 09/12/2015   Procedure: EXCISION SOFT TISSUE NEOPLASM (6 CM), BACK;  Surgeon: Franky Macho, MD;  Location: [facility];  Service: General;  Laterality: N/A;   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   Procedure: TOTAL KNEE ARTHROPLASTY;  Surgeon: Durene Romans, MD;  Location: [facility];  Service:  Orthopedics;  Laterality: Right;  70 mins   VIDEO BRONCHOSCOPY WITH ENDOBRONCHIAL ULTRASOUND Bilateral 01/28/2023   Procedure: VIDEO BRONCHOSCOPY WITH ENDOBRONCHIAL ULTRASOUND;  Surgeon: Josephine Igo, DO;  Location: [facility];  Service: Cardiopulmonary;  Laterality: Bilateral;    Social History: Social History   Socioeconomic History   Marital status: Media planner    Spouse name: Not on file   Number of children: 2   Years of education: 12 years   Highest education level: 12th grade  Occupational History   Occupation: RETIRED  Tobacco Use   Smoking status: Former    Current packs/day: 0.00    Average packs/day: 1 pack/day for 10.0 years (10.0 ttl pk-yrs)    Types: Cigarettes, Pipe    Start date: 09/09/1981    Quit date: 09/10/1991    Years since quitting: 31.8   Smokeless tobacco: Never   Tobacco comments:    Smoked a pipe occasionally for 3 years.  Vaping Use   Vaping status: Never Used   Substance and Sexual Activity   Alcohol use: Not Currently    Alcohol/week: 1.0 standard drink of alcohol    Types: 1 Cans of beer per week   Drug use: Yes    Frequency: 1.0 times per week    Types: Marijuana   Sexual activity: Yes  Other Topics Concern   Not on file  Social History Narrative   2 sons who live close by   Lives with girlfriend   Social Determinants of Health   Financial Resource Strain: Low Risk  (11/06/2022)   Overall Financial Resource Strain (CARDIA)    Difficulty of Paying Living Expenses: Not hard at all  Food Insecurity: No Food Insecurity (04/20/2023)   Hunger Vital Sign    Worried About Running Out of Food in the Last Year: Never true    Ran Out of Food in the Last Year: Never true  Transportation Needs: No Transportation Needs (04/20/2023)   PRAPARE - Administrator, Civil Service (Medical): No    Lack of Transportation (Non-Medical): No  Physical Activity: Sufficiently Active (11/06/2022)   Exercise Vital Sign    Days of Exercise per Week: 7 days    Minutes of Exercise per Session: 30 min  Stress: No Stress Concern Present (11/06/2022)   [employer] of Occupational Health - Occupational Stress Questionnaire    Feeling of Stress : Not at all  Social Connections: Socially Integrated (11/06/2022)   Social Connection and Isolation Panel [NHANES]    Frequency of Communication with Friends and Family: More than three times a week    Frequency of Social Gatherings with Friends and Family: More than three times a week    Attends Religious Services: 1 to 4 times per year    Active Member of [employer] or Organizations: Yes    Attends Banker Meetings: More than 4 times per year    Marital Status: Living with partner  Intimate Partner Violence: Not At Risk (04/20/2023)   Humiliation, Afraid, Rape, and Kick questionnaire    Fear of Current or Ex-Partner: No    Emotionally Abused: No    Physically Abused: No    Sexually Abused: No     Family History: Family History  Problem Relation Age of Onset   Colon cancer Father        age 64    Colon polyps Father    Heart disease Father        Heart failure   Heart disease  Mother    Cancer Sister        Uterine, and ovarian   Hypertension Brother    Obesity Sister    Heart disease Maternal Grandmother    Esophageal cancer Neg Hx    Liver cancer Neg Hx    Pancreatic cancer Neg Hx    Rectal cancer Neg Hx    Stomach cancer Neg Hx     Current Medications:  Current Outpatient Medications:    antiseptic oral rinse (BIOTENE) LIQD, 15 mLs by Mouth Rinse route as needed for dry mouth., Disp: , Rfl:    atorvastatin (LIPITOR) 40 MG tablet, Take 1 tablet (40 mg total) by mouth daily., Disp: 90 tablet, Rfl: 3   BENDAMUSTINE HCL IV, Inject into the vein every 28 (twenty-eight) days. Days 1 and 2 every 28 days, Disp: , Rfl:    ferrous sulfate 325 (65 FE) MG tablet, Take 325 mg by mouth daily with breakfast., Disp: , Rfl:    lidocaine-prilocaine (EMLA) cream, Apply to affected area once, Disp: 30 g, Rfl: 3   Multiple Vitamin (MULTIVITAMIN WITH MINERALS) TABS tablet, Take 1 tablet by mouth daily., Disp: , Rfl:    OLIVE LEAF EXTRACT PO, Take 1 tablet by mouth in the morning and at bedtime., Disp: , Rfl:    OVER THE COUNTER MEDICATION, Take 1 each by mouth 5 (five) times daily. [name], Disp: , Rfl:    prochlorperazine (COMPAZINE) 10 MG tablet, Take 1 tablet (10 mg total) by mouth every 6 (six) hours as needed for nausea or vomiting., Disp: 30 tablet, Rfl: 1   riTUXimab (RITUXAN IV), Inject into the vein every 28 (twenty-eight) days., Disp: , Rfl:    Vitamin D, Ergocalciferol, (DRISDOL) 1.25 MG (50000 UNIT) CAPS capsule, Take 1 capsule (50,000 Units total) by mouth every 7 (seven) days., Disp: 13 capsule, Rfl: 3 No current facility-administered medications for this visit.  Facility-Administered Medications Ordered in Other Visits:    sodium chloride flush (NS) 0.9 % injection  10 mL, 10 mL, Intracatheter, PRN, Doreatha Massed, MD, 10 mL at 07/21/23 1514   Allergies: No Known Allergies  REVIEW OF SYSTEMS:   Review of Systems  Constitutional:  Negative for chills, fatigue and fever.  HENT:   Negative for lump/mass, mouth sores, nosebleeds, sore throat and trouble swallowing.   Eyes:  Negative for eye problems.  Respiratory:  Negative for cough and shortness of breath.   Cardiovascular:  Negative for chest pain, leg swelling and palpitations.  Gastrointestinal:  Negative for abdominal pain, constipation, diarrhea, nausea and vomiting.  Genitourinary:  Negative for bladder incontinence, difficulty urinating, dysuria, frequency, hematuria and nocturia.   Musculoskeletal:  Negative for arthralgias, back pain, flank pain, myalgias and neck pain.  Skin:  Negative for itching and rash.  Neurological:  Negative for dizziness, headaches and numbness.  Hematological:  Does not bruise/bleed easily.  Psychiatric/Behavioral:  Negative for depression, sleep disturbance and suicidal ideas. The patient is not nervous/anxious.   All other systems reviewed and are negative.    VITALS:   Weight 212 lb (96.2 kg).  Wt Readings from Last 3 Encounters:  07/21/23 212 lb (96.2 kg)  06/23/23 216 lb 9.6 oz (98.2 kg)  05/26/23 212 lb 3.2 oz (96.3 kg)    Body mass index is 26.5 kg/m.  Performance status (ECOG): 1 - Symptomatic but completely ambulatory  PHYSICAL EXAM:   Physical Exam Vitals and nursing note reviewed. Exam conducted with a chaperone present.  Constitutional:  Appearance: Normal appearance.  Cardiovascular:     Rate and Rhythm: Normal rate and regular rhythm.     Pulses: Normal pulses.     Heart sounds: Normal heart sounds.  Pulmonary:     Effort: Pulmonary effort is normal.     Breath sounds: Normal breath sounds.  Abdominal:     Palpations: Abdomen is soft. There is no hepatomegaly, splenomegaly or mass.     Tenderness: There is no abdominal  tenderness.  Musculoskeletal:     Right lower leg: No edema.     Left lower leg: No edema.  Lymphadenopathy:     Cervical: No cervical adenopathy.     Right cervical: No superficial, deep or posterior cervical adenopathy.    Left cervical: No superficial, deep or posterior cervical adenopathy.     Upper Body:     Right upper body: No supraclavicular or axillary adenopathy.     Left upper body: No supraclavicular or axillary adenopathy.  Neurological:     General: No focal deficit present.     Mental Status: He is alert and oriented to person, place, and time.  Psychiatric:        Mood and Affect: Mood normal.        Behavior: Behavior normal.     LABS:      Latest Ref Rng & Units 07/21/2023    9:03 AM 06/23/2023    9:21 AM 05/26/2023    9:18 AM  CBC  WBC 4.0 - 10.5 K/uL 3.5  3.8  2.9   Hemoglobin 13.0 - 17.0 g/dL 16.1  09.6  04.5   Hematocrit 39.0 - 52.0 % 40.2  37.0  33.5   Platelets 150 - 400 K/uL 120  120  125       Latest Ref Rng & Units 07/21/2023    9:03 AM 06/23/2023    9:21 AM 05/26/2023    9:18 AM  CMP  Glucose 70 - 99 mg/dL 92  90  91   BUN 8 - 23 mg/dL 13  17  16    Creatinine 0.61 - 1.24 mg/dL 4.09  8.11  9.14   Sodium 135 - 145 mmol/L 138  139  137   Potassium 3.5 - 5.1 mmol/L 4.2  4.2  4.3   Chloride 98 - 111 mmol/L 103  105  103   CO2 22 - 32 mmol/L 27  27  29    Calcium 8.9 - 10.3 mg/dL 8.6  8.6  8.5   Total Protein 6.5 - 8.1 g/dL 6.1  5.8  5.5   Total Bilirubin 0.3 - 1.2 mg/dL 0.8  0.6  0.8   Alkaline Phos 38 - 126 U/L 120  105  100   AST 15 - 41 U/L 19  18  19    ALT 0 - 44 U/L 21  19  19       No results found for: "CEA1", "CEA" / No results found for: "CEA1", "CEA" Lab Results  Component Value Date   PSA1 0.6 06/06/2020   No results found for: "NWG956" No results found for: "CAN125"  Lab Results  Component Value Date   TOTALPROTELP 6.0 01/26/2019   ALBUMINELP 3.9 01/26/2019   A1GS 0.2 01/26/2019   A2GS 0.5 01/26/2019   BETS 0.9 01/26/2019    GAMS 0.5 01/26/2019   MSPIKE Not Observed 01/26/2019   SPEI Comment 01/26/2019   Lab Results  Component Value Date   TIBC 310 08/21/2021   FERRITIN 421 (H) 08/21/2021   IRONPCTSAT  20 08/21/2021   Lab Results  Component Value Date   LDH 128 07/21/2023   LDH 120 06/23/2023   LDH 130 05/26/2023     STUDIES:   No results found.

## 2023-07-21 ENCOUNTER — Inpatient Hospital Stay: Payer: Medicare Other | Attending: Hematology

## 2023-07-21 ENCOUNTER — Inpatient Hospital Stay: Payer: Medicare Other | Admitting: Hematology

## 2023-07-21 ENCOUNTER — Inpatient Hospital Stay: Payer: Medicare Other

## 2023-07-21 VITALS — BP 120/72 | HR 71 | Temp 97.0°F | Resp 18

## 2023-07-21 VITALS — Wt 212.0 lb

## 2023-07-21 DIAGNOSIS — Z5111 Encounter for antineoplastic chemotherapy: Secondary | ICD-10-CM | POA: Insufficient documentation

## 2023-07-21 DIAGNOSIS — C8318 Mantle cell lymphoma, lymph nodes of multiple sites: Secondary | ICD-10-CM

## 2023-07-21 DIAGNOSIS — Z87891 Personal history of nicotine dependence: Secondary | ICD-10-CM | POA: Insufficient documentation

## 2023-07-21 DIAGNOSIS — C8310 Mantle cell lymphoma, unspecified site: Secondary | ICD-10-CM | POA: Insufficient documentation

## 2023-07-21 DIAGNOSIS — Z5112 Encounter for antineoplastic immunotherapy: Secondary | ICD-10-CM | POA: Insufficient documentation

## 2023-07-21 DIAGNOSIS — Z79630 Long term (current) use of alkylating agent: Secondary | ICD-10-CM | POA: Insufficient documentation

## 2023-07-21 DIAGNOSIS — Z5189 Encounter for other specified aftercare: Secondary | ICD-10-CM | POA: Insufficient documentation

## 2023-07-21 LAB — CBC WITH DIFFERENTIAL/PLATELET
Abs Immature Granulocytes: 0.01 10*3/uL (ref 0.00–0.07)
Basophils Absolute: 0 10*3/uL (ref 0.0–0.1)
Basophils Relative: 1 %
Eosinophils Absolute: 0.1 10*3/uL (ref 0.0–0.5)
Eosinophils Relative: 2 %
HCT: 40.2 % (ref 39.0–52.0)
Hemoglobin: 13.6 g/dL (ref 13.0–17.0)
Immature Granulocytes: 0 %
Lymphocytes Relative: 22 %
Lymphs Abs: 0.8 10*3/uL (ref 0.7–4.0)
MCH: 31 pg (ref 26.0–34.0)
MCHC: 33.8 g/dL (ref 30.0–36.0)
MCV: 91.6 fL (ref 80.0–100.0)
Monocytes Absolute: 0.3 10*3/uL (ref 0.1–1.0)
Monocytes Relative: 9 %
Neutro Abs: 2.3 10*3/uL (ref 1.7–7.7)
Neutrophils Relative %: 66 %
Platelets: 120 10*3/uL — ABNORMAL LOW (ref 150–400)
RBC: 4.39 MIL/uL (ref 4.22–5.81)
RDW: 12.7 % (ref 11.5–15.5)
WBC: 3.5 10*3/uL — ABNORMAL LOW (ref 4.0–10.5)
nRBC: 0 % (ref 0.0–0.2)

## 2023-07-21 LAB — COMPREHENSIVE METABOLIC PANEL
ALT: 21 U/L (ref 0–44)
AST: 19 U/L (ref 15–41)
Albumin: 4.2 g/dL (ref 3.5–5.0)
Alkaline Phosphatase: 120 U/L (ref 38–126)
Anion gap: 8 (ref 5–15)
BUN: 13 mg/dL (ref 8–23)
CO2: 27 mmol/L (ref 22–32)
Calcium: 8.6 mg/dL — ABNORMAL LOW (ref 8.9–10.3)
Chloride: 103 mmol/L (ref 98–111)
Creatinine, Ser: 0.68 mg/dL (ref 0.61–1.24)
GFR, Estimated: >60 mL/min (ref >60)
Glucose, Bld: 92 mg/dL (ref 70–99)
Potassium: 4.2 mmol/L (ref 3.5–5.1)
Sodium: 138 mmol/L (ref 135–145)
Total Bilirubin: 0.8 mg/dL (ref 0.3–1.2)
Total Protein: 6.1 g/dL — ABNORMAL LOW (ref 6.5–8.1)

## 2023-07-21 LAB — URIC ACID: Uric Acid, Serum: 6 mg/dL (ref 3.7–8.6)

## 2023-07-21 LAB — LACTATE DEHYDROGENASE: LDH: 128 U/L (ref 98–192)

## 2023-07-21 LAB — MAGNESIUM: Magnesium: 2.2 mg/dL (ref 1.7–2.4)

## 2023-07-21 MED ORDER — HEPARIN SOD (PORK) LOCK FLUSH 100 UNIT/ML IV SOLN
500.0000 [IU] | Freq: Once | INTRAVENOUS | Status: AC | PRN
  Administered 2023-07-21: 500 [IU]

## 2023-07-21 MED ORDER — CETIRIZINE HCL 10 MG/ML IV SOLN
10.0000 mg | Freq: Once | INTRAVENOUS | Status: AC
  Administered 2023-07-21: 10 mg via INTRAVENOUS
  Filled 2023-07-21: qty 1

## 2023-07-21 MED ORDER — SODIUM CHLORIDE 0.9 % IV SOLN: Freq: Once | INTRAVENOUS | Status: AC

## 2023-07-21 MED ORDER — SODIUM CHLORIDE 0.9 % IV SOLN
70.0000 mg/m2 | Freq: Once | INTRAVENOUS | Status: AC
  Administered 2023-07-21: 152.5 mg via INTRAVENOUS
  Filled 2023-07-21: qty 6.1

## 2023-07-21 MED ORDER — PALONOSETRON HCL INJECTION 0.25 MG/5ML
0.2500 mg | Freq: Once | INTRAVENOUS | Status: AC
  Administered 2023-07-21: 0.25 mg via INTRAVENOUS
  Filled 2023-07-21: qty 5

## 2023-07-21 MED ORDER — SODIUM CHLORIDE 0.9% FLUSH
10.0000 mL | INTRAVENOUS | Status: DC | PRN
  Administered 2023-07-21: 10 mL

## 2023-07-21 MED ORDER — SODIUM CHLORIDE 0.9 % IV SOLN
375.0000 mg/m2 | Freq: Once | INTRAVENOUS | Status: AC
  Administered 2023-07-21: 800 mg via INTRAVENOUS
  Filled 2023-07-21: qty 50

## 2023-07-21 MED ORDER — ACETAMINOPHEN 325 MG PO TABS
650.0000 mg | ORAL_TABLET | Freq: Once | ORAL | Status: AC
  Administered 2023-07-21: 650 mg via ORAL
  Filled 2023-07-21: qty 2

## 2023-07-21 MED ORDER — SODIUM CHLORIDE 0.9 % IV SOLN
10.0000 mg | Freq: Once | INTRAVENOUS | Status: AC
  Administered 2023-07-21: 10 mg via INTRAVENOUS
  Filled 2023-07-21: qty 10

## 2023-07-21 MED ORDER — FAMOTIDINE IN NACL 20-0.9 MG/50ML-% IV SOLN
20.0000 mg | Freq: Once | INTRAVENOUS | Status: AC
  Administered 2023-07-21: 20 mg via INTRAVENOUS
  Filled 2023-07-21: qty 50

## 2023-07-21 NOTE — Progress Notes (Signed)
Patient has been examined by Dr. Katragadda. Vital signs and labs have been reviewed by MD - ANC, Creatinine, LFTs, hemoglobin, and platelets are within treatment parameters per M.D. - pt may proceed with treatment.  Primary RN and pharmacy notified.  

## 2023-07-21 NOTE — Progress Notes (Signed)
Patient presents today for Rituximab/Bendeka infusions per providers order.  Labs and vital signs reviewed by MD.  Message received from Chapman Moss RN/Dr. Ellin Saba patient okay for treatment.

## 2023-07-21 NOTE — Patient Instructions (Signed)

## 2023-07-21 NOTE — Patient Instructions (Signed)
[facility]  Discharge Instructions: Thank you for choosing [facility] to provide your oncology and hematology care.  If you have a lab appointment with the Cancer Center - please note that after April 8th, 2024, all labs will be drawn in the cancer center.  You do not have to check in or register with the main entrance as you have in the past but will complete your check-in in the cancer center.  Wear comfortable clothing and clothing appropriate for easy access to any Portacath or PICC line.   We strive to give you quality time with your provider. You may need to reschedule your appointment if you arrive late (15 or more minutes).  Arriving late affects you and other patients whose appointments are after yours.  Also, if you miss three or more appointments without notifying the office, you may be dismissed from the clinic at the provider's discretion.      For prescription refill requests, have your pharmacy contact our office and allow 72 hours for refills to be completed.    Today you received the following chemotherapy and/or immunotherapy agents Rituximab/Bendeka     To help prevent nausea and vomiting after your treatment, we encourage you to take your nausea medication as directed.  BELOW ARE SYMPTOMS THAT SHOULD BE REPORTED IMMEDIATELY: *FEVER GREATER THAN 100.4 F (38 C) OR HIGHER *CHILLS OR SWEATING *NAUSEA AND VOMITING THAT IS NOT CONTROLLED WITH YOUR NAUSEA MEDICATION *UNUSUAL SHORTNESS OF BREATH *UNUSUAL BRUISING OR BLEEDING *URINARY PROBLEMS (pain or burning when urinating, or frequent urination) *BOWEL PROBLEMS (unusual diarrhea, constipation, pain near the anus) TENDERNESS IN MOUTH AND THROAT WITH OR WITHOUT PRESENCE OF ULCERS (sore throat, sores in mouth, or a toothache) UNUSUAL RASH, SWELLING OR PAIN  UNUSUAL VAGINAL DISCHARGE OR ITCHING   Items with * indicate a potential emergency and should be followed up as soon as possible or go  to the Emergency Department if any problems should occur.  Please show the CHEMOTHERAPY ALERT CARD or IMMUNOTHERAPY ALERT CARD at check-in to the Emergency Department and triage nurse.  Should you have questions after your visit or need to cancel or reschedule your appointment, please contact [facility] [phone]  and follow the prompts.  Office hours are 8:00 a.m. to 4:30 p.m. Monday - Friday. Please note that voicemails left after 4:00 p.m. may not be returned until the following business day.  We are closed weekends and major holidays. You have access to a nurse at all times for urgent questions. Please call the main number to the clinic [phone] and follow the prompts.  For any non-urgent questions, you may also contact your provider using MyChart. We now offer e-Visits for anyone 72 and older to request care online for non-urgent symptoms. For details visit [email or web address].   Also download the MyChart app! Go to the app store, search "MyChart", open the app, select [facility], and log in with your MyChart username and password.

## 2023-07-22 ENCOUNTER — Inpatient Hospital Stay: Payer: Medicare Other

## 2023-07-22 ENCOUNTER — Other Ambulatory Visit: Payer: Self-pay

## 2023-07-22 VITALS — BP 104/59 | HR 58 | Temp 97.0°F | Resp 20

## 2023-07-22 DIAGNOSIS — C8318 Mantle cell lymphoma, lymph nodes of multiple sites: Secondary | ICD-10-CM

## 2023-07-22 DIAGNOSIS — Z5112 Encounter for antineoplastic immunotherapy: Secondary | ICD-10-CM | POA: Diagnosis not present

## 2023-07-22 MED ORDER — HEPARIN SOD (PORK) LOCK FLUSH 100 UNIT/ML IV SOLN
500.0000 [IU] | Freq: Once | INTRAVENOUS | Status: AC | PRN
  Administered 2023-07-22: 500 [IU]

## 2023-07-22 MED ORDER — SODIUM CHLORIDE 0.9 % IV SOLN
10.0000 mg | Freq: Once | INTRAVENOUS | Status: AC
  Administered 2023-07-22: 10 mg via INTRAVENOUS
  Filled 2023-07-22: qty 10

## 2023-07-22 MED ORDER — PEGFILGRASTIM 6 MG/0.6ML ~~LOC~~ PSKT
6.0000 mg | PREFILLED_SYRINGE | Freq: Once | SUBCUTANEOUS | Status: AC
  Administered 2023-07-22: 6 mg via SUBCUTANEOUS
  Filled 2023-07-22: qty 0.6

## 2023-07-22 MED ORDER — SODIUM CHLORIDE 0.9 % IV SOLN
70.0000 mg/m2 | Freq: Once | INTRAVENOUS | Status: AC
  Administered 2023-07-22: 152.5 mg via INTRAVENOUS
  Filled 2023-07-22: qty 6.1

## 2023-07-22 MED ORDER — SODIUM CHLORIDE 0.9% FLUSH
10.0000 mL | INTRAVENOUS | Status: DC | PRN
  Administered 2023-07-22: 10 mL

## 2023-07-22 MED ORDER — SODIUM CHLORIDE 0.9 % IV SOLN: Freq: Once | INTRAVENOUS | Status: AC

## 2023-07-22 NOTE — Patient Instructions (Signed)
[facility] [place]  Discharge Instructions: Thank you for choosing [facility] to provide your oncology and hematology care.  If you have a lab appointment with the Cancer Center - please note that after April 8th, 2024, all labs will be drawn in the cancer center.  You do not have to check in or register with the main entrance as you have in the past but will complete your check-in in the cancer center.  Wear comfortable clothing and clothing appropriate for easy access to any Portacath or PICC line.   We strive to give you quality time with your provider. You may need to reschedule your appointment if you arrive late (15 or more minutes).  Arriving late affects you and other patients whose appointments are after yours.  Also, if you miss three or more appointments without notifying the office, you may be dismissed from the clinic at the provider's discretion.      For prescription refill requests, have your pharmacy contact our office and allow 72 hours for refills to be completed.    Today you received the following chemotherapy and/or immunotherapy agents D2 Bendeka   To help prevent nausea and vomiting after your treatment, we encourage you to take your nausea medication as directed.  Bendamustine Injection What is this medication? BENDAMUSTINE (BEN da MUS teen) treats leukemia and lymphoma. It works by slowing down the growth of cancer cells. This medicine may be used for other purposes; ask your health care provider or pharmacist if you have questions. COMMON BRAND NAME(S): Marilynne Halsted, VIVIMUSTA What should I tell my care team before I take this medication? They need to know if you have any of these conditions: Infection, especially a viral infection, such as chickenpox, cold sores, herpes Kidney disease Liver disease An unusual or allergic reaction to bendamustine, mannitol, other medications, foods, dyes, or preservatives Pregnant or  trying to get pregnant Breast-feeding How should I use this medication? This medication is injected into a vein. It is given by your care team in a hospital or clinic setting. Talk to your care team about the use of this medication in children. Special care may be needed. Overdosage: If you think you have taken too much of this medicine contact a poison control center or emergency room at once. NOTE: This medicine is only for you. Do not share this medicine with others. What if I miss a dose? Keep appointments for follow-up doses. It is important not to miss your dose. Call your care team if you are unable to keep an appointment. What may interact with this medication? Do not take this medication with any of the following: Clozapine This medication may also interact with the following: Atazanavir Cimetidine Ciprofloxacin Enoxacin Fluvoxamine Medications for seizures, such as carbamazepine, phenobarbital Mexiletine Rifampin Tacrine Thiabendazole Zileuton This list may not describe all possible interactions. Give your health care provider a list of all the medicines, herbs, non-prescription drugs, or dietary supplements you use. Also tell them if you smoke, drink alcohol, or use illegal drugs. Some items may interact with your medicine. What should I watch for while using this medication? Visit your care team for regular checks on your progress. This medication may make you feel generally unwell. This is not uncommon, as chemotherapy can affect healthy cells as well as cancer cells. Report any side effects. Continue your course of treatment even though you feel ill unless your care team tells you to stop. You may need blood work while  taking this medication. This medication may increase your risk of getting an infection. Call your care team for advice if you get a fever, chills, sore throat, or other symptoms of a cold or flu. Do not treat yourself. Try to avoid being around people who are  sick. This medication may cause serious skin reactions. They can happen weeks to months after starting the medication. Contact your care team right away if you notice fevers or flu-like symptoms with a rash. The rash may be red or purple and then turn into blisters or peeling of the skin. You may also notice a red rash with swelling of the face, lips, or lymph nodes in your neck or under your arms. In some patients, this medication may cause a serious brain infection that may cause death. If you have any problems seeing, thinking, speaking, walking, or standing, tell your care team right away. If you cannot reach your care team, urgently seek other source of medical care. This medication may increase your risk to bruise or bleed. Call your care team if you notice any unusual bleeding. Talk to your care team about your risk of cancer. You may be more at risk for certain types of cancer if you take this medication. Talk to your care team about your risk of skin cancer. You may be more at risk for skin cancer if you take this medication. Talk to your care team if you or your partner wish to become pregnant or think either of you might be pregnant. This medication can cause serious birth defects if taken during pregnancy or for up to 6 months after the last dose. A negative pregnancy test is required before starting this medication. A reliable form of contraception is recommended while taking this medication and for 6 months after the last dose. Talk to your care team about reliable forms of contraception. Wear a condom while taking this medication and for at least 3 months after the last dose. Do not breast-feed while taking this medication or for at least 1 week after the last dose. This medication may cause infertility. Talk to your care team if you are concerned about your fertility. What side effects may I notice from receiving this medication? Side effects that you should report to your care team as soon  as possible: Allergic reactions--skin rash, itching, hives, swelling of the face, lips, tongue, or throat Infection--fever, chills, cough, sore throat, wounds that don't heal, pain or trouble when passing urine, general feeling of discomfort or being unwell Infusion reactions--chest pain, shortness of breath or trouble breathing, feeling faint or lightheaded Liver injury--right upper belly pain, loss of appetite, nausea, light-colored stool, dark yellow or brown urine, yellowing skin or eyes, unusual weakness or fatigue Low red blood cell level--unusual weakness or fatigue, dizziness, headache, trouble breathing Painful swelling, warmth, or redness of the skin, blisters or sores at the infusion site Rash, fever, and swollen lymph nodes Redness, blistering, peeling, or loosening of the skin, including inside the mouth Tumor lysis syndrome (TLS)--nausea, vomiting, diarrhea, decrease in the amount of urine, dark urine, unusual weakness or fatigue, confusion, muscle pain or cramps, fast or irregular heartbeat, joint pain Unusual bruising or bleeding Side effects that usually do not require medical attention (report to your care team if they continue or are bothersome): Diarrhea Fatigue Headache Loss of appetite Nausea Vomiting This list may not describe all possible side effects. Call your doctor for medical advice about side effects. You may report side effects to FDA  at 1-800-FDA-1088. Where should I keep my medication? This medication is given in a hospital or clinic. It will not be stored at home. NOTE: This sheet is a summary. It may not cover all possible information. If you have questions about this medicine, talk to your doctor, pharmacist, or health care provider.  2024 Elsevier/Gold Standard (2022-03-11 00:00:00)   BELOW ARE SYMPTOMS THAT SHOULD BE REPORTED IMMEDIATELY: *FEVER GREATER THAN 100.4 F (38 C) OR HIGHER *CHILLS OR SWEATING *NAUSEA AND VOMITING THAT IS NOT CONTROLLED  WITH YOUR NAUSEA MEDICATION *UNUSUAL SHORTNESS OF BREATH *UNUSUAL BRUISING OR BLEEDING *URINARY PROBLEMS (pain or burning when urinating, or frequent urination) *BOWEL PROBLEMS (unusual diarrhea, constipation, pain near the anus) TENDERNESS IN MOUTH AND THROAT WITH OR WITHOUT PRESENCE OF ULCERS (sore throat, sores in mouth, or a toothache) UNUSUAL RASH, SWELLING OR PAIN  UNUSUAL VAGINAL DISCHARGE OR ITCHING   Items with * indicate a potential emergency and should be followed up as soon as possible or go to the Emergency Department if any problems should occur.  Please show the CHEMOTHERAPY ALERT CARD or IMMUNOTHERAPY ALERT CARD at check-in to the Emergency Department and triage nurse.  Should you have questions after your visit or need to cancel or reschedule your appointment, please contact [facility] [phone]  and follow the prompts.  Office hours are 8:00 a.m. to 4:30 p.m. Monday - Friday. Please note that voicemails left after 4:00 p.m. may not be returned until the following business day.  We are closed weekends and major holidays. You have access to a nurse at all times for urgent questions. Please call the main number to the clinic [phone] and follow the prompts.  For any non-urgent questions, you may also contact your provider using MyChart. We now offer e-Visits for anyone 45 and older to request care online for non-urgent symptoms. For details visit [email or web address].   Also download the MyChart app! Go to the app store, search "MyChart", open the app, select [place], and log in with your MyChart username and password.

## 2023-07-22 NOTE — Progress Notes (Signed)
Patient presents today for Bendeka infusion. Patient is in satisfactory condition with no new complaints voiced.  Vital signs are stable.  Labs reviewed by Dr. Ellin Saba during the office visit and all labs are within treatment parameters.  We will proceed with treatment per MD orders.   Treatment given today per MD orders. Tolerated infusion without adverse affects. Vital signs stable. No complaints at this time. Neulasta OnPro placed on right arm, no alarms beeping. Discharged from clinic ambulatory in stable condition. Alert and oriented x 3. F/U with [facility] as scheduled.

## 2023-08-27 ENCOUNTER — Inpatient Hospital Stay: Payer: Medicare Other | Attending: Hematology

## 2023-08-27 ENCOUNTER — Encounter (HOSPITAL_COMMUNITY)
Admission: RE | Admit: 2023-08-27 | Discharge: 2023-08-27 | Disposition: A | Discharge status: Home / Self Care | Payer: Medicare Other | Source: Ambulatory Visit | Attending: Hematology | Admitting: Hematology

## 2023-08-27 DIAGNOSIS — C8310 Mantle cell lymphoma, unspecified site: Secondary | ICD-10-CM | POA: Diagnosis present

## 2023-08-27 DIAGNOSIS — C8318 Mantle cell lymphoma, lymph nodes of multiple sites: Secondary | ICD-10-CM | POA: Insufficient documentation

## 2023-08-27 LAB — COMPREHENSIVE METABOLIC PANEL
ALT: 16 U/L (ref 0–44)
AST: 14 U/L — ABNORMAL LOW (ref 15–41)
Albumin: 3.9 g/dL (ref 3.5–5.0)
Alkaline Phosphatase: 104 U/L (ref 38–126)
Anion gap: 7 (ref 5–15)
BUN: 15 mg/dL (ref 8–23)
CO2: 27 mmol/L (ref 22–32)
Calcium: 8.3 mg/dL — ABNORMAL LOW (ref 8.9–10.3)
Chloride: 103 mmol/L (ref 98–111)
Creatinine, Ser: 0.74 mg/dL (ref 0.61–1.24)
GFR, Estimated: >60 mL/min (ref >60)
Glucose, Bld: 93 mg/dL (ref 70–99)
Potassium: 4 mmol/L (ref 3.5–5.1)
Sodium: 137 mmol/L (ref 135–145)
Total Bilirubin: 0.7 mg/dL (ref 0.3–1.2)
Total Protein: 5.5 g/dL — ABNORMAL LOW (ref 6.5–8.1)

## 2023-08-27 LAB — CBC WITH DIFFERENTIAL/PLATELET
Abs Immature Granulocytes: 0.01 10*3/uL (ref 0.00–0.07)
Basophils Absolute: 0 10*3/uL (ref 0.0–0.1)
Basophils Relative: 1 %
Eosinophils Absolute: 0.1 10*3/uL (ref 0.0–0.5)
Eosinophils Relative: 3 %
HCT: 35.5 % — ABNORMAL LOW (ref 39.0–52.0)
Hemoglobin: 12.3 g/dL — ABNORMAL LOW (ref 13.0–17.0)
Immature Granulocytes: 0 %
Lymphocytes Relative: 22 %
Lymphs Abs: 0.6 10*3/uL — ABNORMAL LOW (ref 0.7–4.0)
MCH: 31.5 pg (ref 26.0–34.0)
MCHC: 34.6 g/dL (ref 30.0–36.0)
MCV: 90.8 fL (ref 80.0–100.0)
Monocytes Absolute: 0.3 10*3/uL (ref 0.1–1.0)
Monocytes Relative: 9 %
Neutro Abs: 1.8 10*3/uL (ref 1.7–7.7)
Neutrophils Relative %: 65 %
Platelets: 104 10*3/uL — ABNORMAL LOW (ref 150–400)
RBC: 3.91 MIL/uL — ABNORMAL LOW (ref 4.22–5.81)
RDW: 12.8 % (ref 11.5–15.5)
WBC: 2.8 10*3/uL — ABNORMAL LOW (ref 4.0–10.5)
nRBC: 0 % (ref 0.0–0.2)

## 2023-08-27 LAB — LACTATE DEHYDROGENASE: LDH: 129 U/L (ref 98–192)

## 2023-08-27 MED ORDER — SODIUM CHLORIDE 0.9% FLUSH
10.0000 mL | Freq: Once | INTRAVENOUS | Status: AC
  Administered 2023-08-27: 10 mL via INTRAVENOUS

## 2023-08-27 MED ORDER — HEPARIN SOD (PORK) LOCK FLUSH 100 UNIT/ML IV SOLN: 500.0000 [IU] | Freq: Once | INTRAVENOUS | Status: DC

## 2023-08-27 MED ORDER — HEPARIN SOD (PORK) LOCK FLUSH 100 UNIT/ML IV SOLN
INTRAVENOUS | Status: AC
  Filled 2023-08-27: qty 5

## 2023-08-27 MED ORDER — FLUDEOXYGLUCOSE F - 18 (FDG) INJECTION
10.1300 | Freq: Once | INTRAVENOUS | Status: AC | PRN
  Administered 2023-08-27: 10.13 via INTRAVENOUS

## 2023-08-27 NOTE — Progress Notes (Signed)
Travis Wood presented for Portacath access and flush. Proper placement of portacath confirmed by CXR. Portacath located right chest wall accessed with  H 20 needle. Good blood return present. Portacath flushed with 20ml NS and 500U/77ml Heparin and needle removed intact. Procedure without incident. Patient tolerated procedure well.

## 2023-08-27 NOTE — Patient Instructions (Signed)
[facility] [place]  Discharge Instructions: Thank you for choosing [facility] to provide your oncology and hematology care.  If you have a lab appointment with the Cancer Center - please note that after April 8th, 2024, all labs will be drawn in the cancer center.  You do not have to check in or register with the main entrance as you have in the past but will complete your check-in in the cancer center.  Wear comfortable clothing and clothing appropriate for easy access to any Portacath or PICC line.   We strive to give you quality time with your provider. You may need to reschedule your appointment if you arrive late (15 or more minutes).  Arriving late affects you and other patients whose appointments are after yours.  Also, if you miss three or more appointments without notifying the office, you may be dismissed from the clinic at the provider's discretion.      For prescription refill requests, have your pharmacy contact our office and allow 72 hours for refills to be completed.    Today you received a port flush with labs   To help prevent nausea and vomiting after your treatment, we encourage you to take your nausea medication as directed.  BELOW ARE SYMPTOMS THAT SHOULD BE REPORTED IMMEDIATELY: *FEVER GREATER THAN 100.4 F (38 C) OR HIGHER *CHILLS OR SWEATING *NAUSEA AND VOMITING THAT IS NOT CONTROLLED WITH YOUR NAUSEA MEDICATION *UNUSUAL SHORTNESS OF BREATH *UNUSUAL BRUISING OR BLEEDING *URINARY PROBLEMS (pain or burning when urinating, or frequent urination) *BOWEL PROBLEMS (unusual diarrhea, constipation, pain near the anus) TENDERNESS IN MOUTH AND THROAT WITH OR WITHOUT PRESENCE OF ULCERS (sore throat, sores in mouth, or a toothache) UNUSUAL RASH, SWELLING OR PAIN  UNUSUAL VAGINAL DISCHARGE OR ITCHING   Items with * indicate a potential emergency and should be followed up as soon as possible or go to the Emergency Department if any problems should  occur.  Please show the CHEMOTHERAPY ALERT CARD or IMMUNOTHERAPY ALERT CARD at check-in to the Emergency Department and triage nurse.  Should you have questions after your visit or need to cancel or reschedule your appointment, please contact [facility] AT [facility] [phone]  and follow the prompts.  Office hours are 8:00 a.m. to 4:30 p.m. Monday - Friday. Please note that voicemails left after 4:00 p.m. may not be returned until the following business day.  We are closed weekends and major holidays. You have access to a nurse at all times for urgent questions. Please call the main number to the clinic [phone] and follow the prompts.  For any non-urgent questions, you may also contact your provider using MyChart. We now offer e-Visits for anyone 86 and older to request care online for non-urgent symptoms. For details visit [email or web address].   Also download the MyChart app! Go to the app store, search "MyChart", open the app, select [place], and log in with your MyChart username and password.

## 2023-09-03 ENCOUNTER — Inpatient Hospital Stay: Payer: Medicare Other | Attending: Hematology | Admitting: Hematology

## 2023-09-03 ENCOUNTER — Encounter: Payer: Self-pay | Admitting: Hematology

## 2023-09-03 VITALS — BP 115/64 | HR 58 | Temp 97.8°F | Resp 16 | Wt 214.2 lb

## 2023-09-03 DIAGNOSIS — Z79620 Long term (current) use of immunosuppressive biologic: Secondary | ICD-10-CM | POA: Insufficient documentation

## 2023-09-03 DIAGNOSIS — C8310 Mantle cell lymphoma, unspecified site: Secondary | ICD-10-CM | POA: Diagnosis not present

## 2023-09-03 DIAGNOSIS — Z5112 Encounter for antineoplastic immunotherapy: Secondary | ICD-10-CM | POA: Insufficient documentation

## 2023-09-03 DIAGNOSIS — Z87891 Personal history of nicotine dependence: Secondary | ICD-10-CM | POA: Diagnosis not present

## 2023-09-03 DIAGNOSIS — C8318 Mantle cell lymphoma, lymph nodes of multiple sites: Secondary | ICD-10-CM | POA: Diagnosis not present

## 2023-09-03 NOTE — Progress Notes (Signed)
[facility] [address] [place] [address]    Clinic Day:  09/03/23    Referring physician: Doreatha Massed, MD  Patient Care Team: Doreatha Massed, MD as PCP - General (Hematology) Doreatha Massed, MD as Medical Oncologist (Medical Oncology) Therese Sarah, RN as Oncology Nurse Navigator (Medical Oncology)   ASSESSMENT & PLAN:   Assessment: 1.  Chronic lymphocytic leukemia: - Flow cytometry (01/26/2019): Monoclonal B-cell population consistent with CLL. - CT chest (12/12/2022): Large right hilar mass/bulky adenopathy measuring 7.5 x 6.4 cm encasing the right mainstem bronchus and lobar branches.  Additional mediastinal adenopathy, axillary and lower cervical adenopathy. - CT [facility] (12/12/2022): Severe splenomegaly measuring 21.5 cm.  Prominent retroperitoneal lymph nodes, left retroperitoneal nodes measuring 1.6 x 1.0 cm.  Subcapsular hypodense lesion of the peripheral liver dome measuring 1.6 x 1.1 cm.  Additional subcentimeter hypodense lesions too small to characterize. - No B symptoms.  Patient plays golf 3-4 times per week.  No recurrent infections. - CLL FISH panel with 13 q. deletion - T p53 mutation negative - IGHV hyper mutation present. - PET scan (01/08/2023): Marked right hilar adenopathy with lymph node conglomerate/large lymph node measuring 5 cm with SUV of 12.3.  There is hypermetabolic adenopathy above and below diaphragm with splenomegaly. - Bronchoscopy (01/28/2023) and biopsy of the right hilar mass by Dr. Tonia Brooms - Pathology: CD5 positive B-cell lymphoproliferative disorder, cyclin D1 positive, favoring mantle cell lymphoma.  FISH for t(11:14) positive indicating mantle cell lymphoma. - 6 cycles of Bendamustine and rituximab from 03/02/2023 through 07/21/2023 - Maintenance rituximab started on 09/30/2023   2.  Social/family history: - Lives at home with his girlfriend.  Independent of ADLs and IADLs. - He retired 8 years ago after  working at [employer] in [place].  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  Quit smoking in 1992.  Smoked 1 pack/day for 10 years. - Sister had breast cancer.  Another sister had ovarian and stomach cancer.  Father had colon cancer.  Mother had CLL.  Paternal aunt had lung cancer.  Another paternal aunt had breast cancer.    Plan: 1.  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 - He tolerated them very well.  We reviewed labs from 08/27/2023 which showed mild leukopenia and thrombocytopenia. - PET scan on 08/27/2023: No findings of recurrent lymphoma.  Spleen has decreased to normal size of 12 x 10 cm. - We talked about maintenance therapy with rituximab every 2 months for 2 to 3 years.  We also talked about observation. - Patient would like to proceed with maintenance rituximab.  We will schedule him for first treatment on 09/30/2023.  I will see him back prior to cycle 2 of maintenance rituximab.  We will continue to monitor with CT scans every 6 months or sooner if symptomatic.       No orders of the defined types were placed in this encounter.      Travis Wood,acting as a Neurosurgeon for Doreatha Massed, MD.,have documented all relevant documentation on the behalf of Doreatha Massed, MD,as directed by  Doreatha Massed, MD while in the presence of Doreatha Massed, MD.  I, Doreatha Massed MD, have reviewed the above documentation for accuracy and completeness, and I agree with the above.      Doreatha Massed, MD   10/3/20244:55 PM  CHIEF COMPLAINT:   Diagnosis: mantle cell lymphoma  Cancer Staging  Mantle cell lymphoma (HCC) Staging form: Hodgkin and Non-Hodgkin Lymphoma, AJCC 8th Edition - Clinical stage from 02/11/2023: Stage III (Mantle cell lymphoma) - Signed by Doreatha Massed, MD on 02/11/2023    Prior Therapy: none  Current Therapy:   Bendamustine and rituximab every 28 days for 6 cycles    HISTORY OF PRESENT ILLNESS:   Oncology History  Mantle cell lymphoma (HCC)  02/11/2023 Initial Diagnosis   Mantle cell lymphoma (HCC)   02/11/2023 Cancer Staging   Staging form: Hodgkin and Non-Hodgkin Lymphoma, AJCC 8th Edition - Clinical stage from 02/11/2023: Stage III (Mantle cell lymphoma) - Signed by Doreatha Massed, MD on 02/11/2023 Histopathologic type: Mantle cell lymphoma (Includes all variants: blastic, pleomorphic, small cell) Stage prefix: Initial diagnosis   03/02/2023 - 07/22/2023 Chemotherapy   Patient is on Treatment Plan : NON-HODGKINS LYMPHOMA Rituximab D1 + Bendamustine D1,2 q28d x 6 cycles     09/30/2023 -  Chemotherapy   Patient is on Treatment Plan : NON-HODGKINS LYMPHOMA Rituximab q60d Maintenance        INTERVAL HISTORY:   Travis Wood is a 72 y.o. male presenting to clinic today for follow up of mantle cell lymphoma. He was last seen by me on 07/21/23.  Since her last visit, she underwent restaging PET on 08/27/23 that found: no findings for recurrent lymphoma involving the neck, chest, abdomen/pelvis or bony structures and interval decrease in size of the spleen.  Today, he states that he is doing well overall. His appetite level is at 100%. His energy level is at 100%. He would like to proceed with maintenance rituximab.   PAST MEDICAL HISTORY:   Past Medical History: Past Medical History:  Diagnosis Date   Anemia    Cataract    CLL (chronic lymphocytic leukemia) (HCC)    Erectile dysfunction    History of bronchitis    History of colon polyps    Hyperlipemia    Osteoarthritis    Shortness of breath dyspnea    with exertion    Surgical History: Past Surgical History:  Procedure Laterality Date   APPENDECTOMY     BRONCHIAL NEEDLE ASPIRATION BIOPSY  01/28/2023   Procedure: BRONCHIAL NEEDLE ASPIRATION BIOPSIES;  Surgeon: Josephine Igo, DO;  Location: [facility];  Service: Cardiopulmonary;;    CATARACT EXTRACTION W/PHACO Left 10/15/2015   Procedure: CATARACT EXTRACTION PHACO AND INTRAOCULAR LENS PLACEMENT LEFT EYE;  Surgeon: Gemma Payor, MD;  Location: [facility];  Service: Ophthalmology;  Laterality: Left;  CDE:12.20   CATARACT EXTRACTION W/PHACO Right 11/12/2015   Procedure: CATARACT EXTRACTION PHACO AND INTRAOCULAR LENS PLACEMENT RIGHT EYE:  CDE:  8.99;  Surgeon: Gemma Payor, MD;  Location: [facility];  Service: Ophthalmology;  Laterality: Right;   IR IMAGING GUIDED PORT INSERTION  03/04/2023   KNEE SURGERY     Left x 2    MASS EXCISION N/A 09/12/2015   Procedure: EXCISION SOFT TISSUE NEOPLASM (6 CM), BACK;  Surgeon: Franky Macho, MD;  Location: [facility];  Service: General;  Laterality: N/A;   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   Procedure: TOTAL KNEE ARTHROPLASTY;  Surgeon: Durene Romans, MD;  Location: [facility];  Service: Orthopedics;  Laterality: Right;  70 mins   VIDEO BRONCHOSCOPY WITH ENDOBRONCHIAL ULTRASOUND Bilateral 01/28/2023   Procedure: VIDEO BRONCHOSCOPY WITH ENDOBRONCHIAL ULTRASOUND;  Surgeon: Josephine Igo, DO;  Location: [facility];  Service: Cardiopulmonary;  Laterality: Bilateral;    Social History: Social History   Socioeconomic History   Marital status: Media planner  Spouse name: Not on file   Number of children: 2   Years of education: 12 years   Highest education level: 12th grade  Occupational History   Occupation: RETIRED  Tobacco Use   Smoking status: Former    Current packs/day: 0.00    Average packs/day: 1 pack/day for 10.0 years (10.0 ttl pk-yrs)    Types: Cigarettes, Pipe    Start date: 09/09/1981    Quit date: 09/10/1991    Years since quitting: 32.0   Smokeless tobacco: Never   Tobacco comments:    Smoked a pipe occasionally for 3 years.  Vaping Use   Vaping status: Never Used  Substance and Sexual Activity   Alcohol use: Not Currently    Alcohol/week: 1.0 standard drink of alcohol    Types: 1 Cans of beer per week   Drug use:  Yes    Frequency: 1.0 times per week    Types: Marijuana   Sexual activity: Yes  Other Topics Concern   Not on file  Social History Narrative   2 sons who live close by   Lives with girlfriend   Social Determinants of Health   Financial Resource Strain: Low Risk  (11/06/2022)   Overall Financial Resource Strain (CARDIA)    Difficulty of Paying Living Expenses: Not hard at all  Food Insecurity: No Food Insecurity (04/20/2023)   Hunger Vital Sign    Worried About Running Out of Food in the Last Year: Never true    Ran Out of Food in the Last Year: Never true  Transportation Needs: No Transportation Needs (04/20/2023)   PRAPARE - Administrator, Civil Service (Medical): No    Lack of Transportation (Non-Medical): No  Physical Activity: Sufficiently Active (11/06/2022)   Exercise Vital Sign    Days of Exercise per Week: 7 days    Minutes of Exercise per Session: 30 min  Stress: No Stress Concern Present (11/06/2022)   [employer] of Occupational Health - Occupational Stress Questionnaire    Feeling of Stress : Not at all  Social Connections: Socially Integrated (11/06/2022)   Social Connection and Isolation Panel [NHANES]    Frequency of Communication with Friends and Family: More than three times a week    Frequency of Social Gatherings with Friends and Family: More than three times a week    Attends Religious Services: 1 to 4 times per year    Active Member of [employer] or Organizations: Yes    Attends Engineer, structural: More than 4 times per year    Marital Status: Living with partner  Intimate Partner Violence: Not At Risk (04/20/2023)   Humiliation, Afraid, Rape, and Kick questionnaire    Fear of Current or Ex-Partner: No    Emotionally Abused: No    Physically Abused: No    Sexually Abused: No    Family History: Family History  Problem Relation Age of Onset   Colon cancer Father        age 20    Colon polyps Father    Heart disease Father         Heart failure   Heart disease Mother    Cancer Sister        Uterine, and ovarian   Hypertension Brother    Obesity Sister    Heart disease Maternal Grandmother    Esophageal cancer Neg Hx    Liver cancer Neg Hx    Pancreatic cancer Neg Hx    Rectal cancer Neg Hx  Stomach cancer Neg Hx     Current Medications:  Current Outpatient Medications:    antiseptic oral rinse (BIOTENE) LIQD, 15 mLs by Mouth Rinse route as needed for dry mouth., Disp: , Rfl:    atorvastatin (LIPITOR) 40 MG tablet, Take 1 tablet (40 mg total) by mouth daily., Disp: 90 tablet, Rfl: 3   BENDAMUSTINE HCL IV, Inject into the vein every 28 (twenty-eight) days. Days 1 and 2 every 28 days, Disp: , Rfl:    ferrous sulfate 325 (65 FE) MG tablet, Take 325 mg by mouth daily with breakfast., Disp: , Rfl:    Multiple Vitamin (MULTIVITAMIN WITH MINERALS) TABS tablet, Take 1 tablet by mouth daily., Disp: , Rfl:    OLIVE LEAF EXTRACT PO, Take 1 tablet by mouth in the morning and at bedtime., Disp: , Rfl:    OVER THE COUNTER MEDICATION, Take 1 each by mouth 5 (five) times daily. [name], Disp: , Rfl:    riTUXimab (RITUXAN IV), Inject into the vein every 28 (twenty-eight) days., Disp: , Rfl:    Vitamin D, Ergocalciferol, (DRISDOL) 1.25 MG (50000 UNIT) CAPS capsule, Take 1 capsule (50,000 Units total) by mouth every 7 (seven) days., Disp: 13 capsule, Rfl: 3   Allergies: No Known Allergies  REVIEW OF SYSTEMS:   Review of Systems  Constitutional:  Negative for chills, fatigue and fever.  HENT:   Negative for lump/mass, mouth sores, nosebleeds, sore throat and trouble swallowing.   Eyes:  Negative for eye problems.  Respiratory:  Negative for cough and shortness of breath.   Cardiovascular:  Negative for chest pain, leg swelling and palpitations.  Gastrointestinal:  Negative for abdominal pain, constipation, diarrhea, nausea and vomiting.  Genitourinary:  Negative for bladder incontinence, difficulty urinating, dysuria,  frequency, hematuria and nocturia.   Musculoskeletal:  Negative for arthralgias, back pain, flank pain, myalgias and neck pain.  Skin:  Negative for itching and rash.  Neurological:  Negative for dizziness, headaches and numbness.  Hematological:  Does not bruise/bleed easily.  Psychiatric/Behavioral:  Negative for depression, sleep disturbance and suicidal ideas. The patient is not nervous/anxious.   All other systems reviewed and are negative.    VITALS:   Blood pressure 115/64, pulse (!) 58, temperature 97.8 F (36.6 C), temperature source Oral, resp. rate 16, weight 214 lb 3.2 oz (97.2 kg), SpO2 100%.  Wt Readings from Last 3 Encounters:  09/03/23 214 lb 3.2 oz (97.2 kg)  07/21/23 212 lb (96.2 kg)  06/23/23 216 lb 9.6 oz (98.2 kg)    Body mass index is 26.77 kg/m.  Performance status (ECOG): 1 - Symptomatic but completely ambulatory  PHYSICAL EXAM:   Physical Exam Vitals and nursing note reviewed. Exam conducted with a chaperone present.  Constitutional:      Appearance: Normal appearance.  Cardiovascular:     Rate and Rhythm: Normal rate and regular rhythm.     Pulses: Normal pulses.     Heart sounds: Normal heart sounds.  Pulmonary:     Effort: Pulmonary effort is normal.     Breath sounds: Normal breath sounds.  Abdominal:     Palpations: Abdomen is soft. There is no hepatomegaly, splenomegaly or mass.     Tenderness: There is no abdominal tenderness.  Musculoskeletal:     Right lower leg: No edema.     Left lower leg: No edema.  Lymphadenopathy:     Cervical: No cervical adenopathy.     Right cervical: No superficial, deep or posterior cervical adenopathy.  Left cervical: No superficial, deep or posterior cervical adenopathy.     Upper Body:     Right upper body: No supraclavicular or axillary adenopathy.     Left upper body: No supraclavicular or axillary adenopathy.  Neurological:     General: No focal deficit present.     Mental Status: He is alert and  oriented to person, place, and time.  Psychiatric:        Mood and Affect: Mood normal.        Behavior: Behavior normal.     LABS:      Latest Ref Rng & Units 08/27/2023   10:02 AM 07/21/2023    9:03 AM 06/23/2023    9:21 AM  CBC  WBC 4.0 - 10.5 K/uL 2.8  3.5  3.8   Hemoglobin 13.0 - 17.0 g/dL 40.9  81.1  91.4   Hematocrit 39.0 - 52.0 % 35.5  40.2  37.0   Platelets 150 - 400 K/uL 104  120  120       Latest Ref Rng & Units 08/27/2023   10:02 AM 07/21/2023    9:03 AM 06/23/2023    9:21 AM  CMP  Glucose 70 - 99 mg/dL 93  92  90   BUN 8 - 23 mg/dL 15  13  17    Creatinine 0.61 - 1.24 mg/dL 7.82  9.56  2.13   Sodium 135 - 145 mmol/L 137  138  139   Potassium 3.5 - 5.1 mmol/L 4.0  4.2  4.2   Chloride 98 - 111 mmol/L 103  103  105   CO2 22 - 32 mmol/L 27  27  27    Calcium 8.9 - 10.3 mg/dL 8.3  8.6  8.6   Total Protein 6.5 - 8.1 g/dL 5.5  6.1  5.8   Total Bilirubin 0.3 - 1.2 mg/dL 0.7  0.8  0.6   Alkaline Phos 38 - 126 U/L 104  120  105   AST 15 - 41 U/L 14  19  18    ALT 0 - 44 U/L 16  21  19       No results found for: "CEA1", "CEA" / No results found for: "CEA1", "CEA" Lab Results  Component Value Date   PSA1 0.6 06/06/2020   No results found for: "YQM578" No results found for: "ION629"  Lab Results  Component Value Date   TOTALPROTELP 6.0 01/26/2019   ALBUMINELP 3.9 01/26/2019   A1GS 0.2 01/26/2019   A2GS 0.5 01/26/2019   BETS 0.9 01/26/2019   GAMS 0.5 01/26/2019   MSPIKE Not Observed 01/26/2019   SPEI Comment 01/26/2019   Lab Results  Component Value Date   TIBC 310 08/21/2021   FERRITIN 421 (H) 08/21/2021   IRONPCTSAT 20 08/21/2021   Lab Results  Component Value Date   LDH 129 08/27/2023   LDH 128 07/21/2023   LDH 120 06/23/2023     STUDIES:   NM PET Image Restag (PS) Skull Base To Thigh  Result Date: 09/03/2023 CLINICAL DATA:  Subsequent treatment strategy for mantle cell lymphoma. EXAM: NUCLEAR MEDICINE PET SKULL BASE TO THIGH TECHNIQUE: 10.13 mCi  F-18 FDG was injected intravenously. Full-ring PET imaging was performed from the skull base to thigh after the radiotracer. CT data was obtained and used for attenuation correction and anatomic localization. Fasting blood glucose: 90 mg/dl COMPARISON:  PET CTs 52/84/1324 and 05/29/2023. FINDINGS: Mediastinal blood pool activity: SUV max 2.11 Liver activity: SUV max 2.56 NECK: No hypermetabolic lymph nodes in  the neck. Incidental CT findings: None. CHEST: No hypermetabolic mediastinal or hilar nodes. No suspicious pulmonary nodules on the CT scan. Incidental CT findings: Stable atherosclerotic calcifications involving the aorta and coronary arteries. The Port-A-Cath is in good position without complicating features. ABDOMEN/PELVIS: No abnormal hypermetabolic activity within the liver, pancreas, adrenal glands, or spleen. No enlarged or hypermetabolic lymph nodes in the abdomen or pelvis. No inguinal adenopathy. Interval decrease in size of the spleen. The spleen measures 12 x 10 cm on image 93/202 and previously measured 14.5 x 12 cm. No splenic hypermetabolism. Incidental CT findings: Stable atherosclerotic calcifications involving the aorta and iliac arteries but no aneurysm. Stable sigmoid colon diverticulosis. SKELETON: No findings for osseous lymphoma. Incidental CT findings: None. IMPRESSION: 1. No findings for recurrent lymphoma involving the neck, chest, abdomen/pelvis or bony structures. 2. Interval decrease in size of the spleen. Aortic Atherosclerosis (ICD10-I70.0). Electronically Signed   By: Rudie Meyer M.D.   On: 09/03/2023 09:27

## 2023-09-03 NOTE — Patient Instructions (Signed)
[facility]  Discharge Instructions  You were seen and examined today by Dr. Ellin Saba.  Dr. Ellin Saba discussed your most recent lab work and CT scan which revealed that everything looks good.   Dr. Ellin Saba is going to get your scheduled for preventative treatment.  Follow-up as scheduled.    Thank you for choosing [facility] to provide your oncology and hematology care.   To afford each patient quality time with our provider, please arrive at least 15 minutes before your scheduled appointment time. You may need to reschedule your appointment if you arrive late (10 or more minutes). Arriving late affects you and other patients whose appointments are after yours.  Also, if you miss three or more appointments without notifying the office, you may be dismissed from the clinic at the provider's discretion.    Again, thank you for choosing [facility].  Our hope is that these requests will decrease the amount of time that you wait before being seen by our physicians.   If you have a lab appointment with the Cancer Center - please note that after April 8th, all labs will be drawn in the cancer center.  You do not have to check in or register with the main entrance as you have in the past but will complete your check-in at the cancer center.            _____________________________________________________________  Should you have questions after your visit to [facility], please contact our office at [phone] and follow the prompts.  Our office hours are 8:00 a.m. to 4:30 p.m. Monday - Thursday and 8:00 a.m. to 2:30 p.m. Friday.  Please note that voicemails left after 4:00 p.m. may not be returned until the following business day.  We are closed weekends and all major holidays.  You do have access to a nurse 24-7, just call the main number to the clinic [phone] and do not press any options, hold on the  line and a nurse will answer the phone.    For prescription refill requests, have your pharmacy contact our office and allow 72 hours.    Masks are no longer required in the cancer centers. If you would like for your care team to wear a mask while they are taking care of you, please let them know. You may have one support person who is at least 72 years old accompany you for your appointments.

## 2023-09-03 NOTE — Progress Notes (Signed)
ON PATHWAY REGIMEN - Lymphoma and CLL  No Change  Continue With Treatment as Ordered.  Original Decision Date/Time: 02/11/2023 16:46     A cycle is every 28 days:     Rituximab-xxxx      Bendamustine   **Always confirm dose/schedule in your pharmacy ordering system**  Patient Characteristics: Mantle Cell Lymphoma, First Line, Aggressive Disease or Treatment Indicated, Stage II - IV, Not a Transplant Candidate Disease Type: Not Applicable Disease Type: Mantle Cell Lymphoma Disease Type: Not Applicable Line of Therapy: First Line Was TP53 Mutation Testing Completed<= Yes, TP53 Mutation Negative Patient Characteristics: Not a Transplant Candidate Intent of Therapy: Non-Curative / Palliative Intent, Discussed with Patient

## 2023-09-03 NOTE — Progress Notes (Signed)
DISCONTINUE ON PATHWAY REGIMEN - Lymphoma and CLL     A cycle is every 28 days:     Rituximab-xxxx      Bendamustine   **Always confirm dose/schedule in your pharmacy ordering system**  REASON: Other Reason PRIOR TREATMENT: [id number]: Bendamustine + Rituximab IV (90/375) q28 Days x 6 Cycles (or 2 Cycles Beyond CR) TREATMENT RESPONSE: Complete Response (CR)  START ON PATHWAY REGIMEN - Lymphoma and CLL     A cycle is every 2 months:     Rituximab-xxxx   **Always confirm dose/schedule in your pharmacy ordering system**  Patient Characteristics: Mantle Cell Lymphoma, Maintenance Therapy Disease Type: Not Applicable Disease Type: Mantle Cell Lymphoma Disease Type: Not Applicable Line of Therapy: Maintenance Therapy Was TP53 Mutation Testing Completed<= Yes, TP53 Mutation Negative Intent of Therapy: Non-Curative / Palliative Intent, Discussed with Patient

## 2023-09-04 ENCOUNTER — Other Ambulatory Visit: Payer: Self-pay

## 2023-09-16 ENCOUNTER — Other Ambulatory Visit: Payer: Self-pay

## 2023-09-22 ENCOUNTER — Other Ambulatory Visit: Payer: Self-pay

## 2023-09-22 DIAGNOSIS — C8318 Mantle cell lymphoma, lymph nodes of multiple sites: Secondary | ICD-10-CM

## 2023-09-23 NOTE — Progress Notes (Signed)
Discontinue diphenhydramine from oncology treatment plan --> Add Quzyttir (cetirizine) 10 mg IVPush x 1 as premedication for oncology treatment plan.  T.O. Dr Katragadda/Carol Jones, PharmD  

## 2023-09-30 ENCOUNTER — Inpatient Hospital Stay: Payer: Medicare Other

## 2023-09-30 VITALS — BP 133/78 | HR 50 | Temp 97.4°F | Resp 18

## 2023-09-30 DIAGNOSIS — C8318 Mantle cell lymphoma, lymph nodes of multiple sites: Secondary | ICD-10-CM

## 2023-09-30 DIAGNOSIS — Z5112 Encounter for antineoplastic immunotherapy: Secondary | ICD-10-CM | POA: Diagnosis not present

## 2023-09-30 LAB — COMPREHENSIVE METABOLIC PANEL
ALT: 19 U/L (ref 0–44)
AST: 20 U/L (ref 15–41)
Albumin: 4.1 g/dL (ref 3.5–5.0)
Alkaline Phosphatase: 109 U/L (ref 38–126)
Anion gap: 8 (ref 5–15)
BUN: 14 mg/dL (ref 8–23)
CO2: 26 mmol/L (ref 22–32)
Calcium: 8.6 mg/dL — ABNORMAL LOW (ref 8.9–10.3)
Chloride: 104 mmol/L (ref 98–111)
Creatinine, Ser: 0.78 mg/dL (ref 0.61–1.24)
GFR, Estimated: >60 mL/min (ref >60)
Glucose, Bld: 118 mg/dL — ABNORMAL HIGH (ref 70–99)
Potassium: 4.1 mmol/L (ref 3.5–5.1)
Sodium: 138 mmol/L (ref 135–145)
Total Bilirubin: 0.7 mg/dL (ref 0.3–1.2)
Total Protein: 6 g/dL — ABNORMAL LOW (ref 6.5–8.1)

## 2023-09-30 LAB — CBC WITH DIFFERENTIAL/PLATELET
Abs Immature Granulocytes: 0.02 10*3/uL (ref 0.00–0.07)
Basophils Absolute: 0 10*3/uL (ref 0.0–0.1)
Basophils Relative: 1 %
Eosinophils Absolute: 0.1 10*3/uL (ref 0.0–0.5)
Eosinophils Relative: 3 %
HCT: 37.5 % — ABNORMAL LOW (ref 39.0–52.0)
Hemoglobin: 12.8 g/dL — ABNORMAL LOW (ref 13.0–17.0)
Immature Granulocytes: 1 %
Lymphocytes Relative: 41 %
Lymphs Abs: 0.9 10*3/uL (ref 0.7–4.0)
MCH: 31.4 pg (ref 26.0–34.0)
MCHC: 34.1 g/dL (ref 30.0–36.0)
MCV: 91.9 fL (ref 80.0–100.0)
Monocytes Absolute: 0.3 10*3/uL (ref 0.1–1.0)
Monocytes Relative: 15 %
Neutro Abs: 0.9 10*3/uL — ABNORMAL LOW (ref 1.7–7.7)
Neutrophils Relative %: 39 %
Platelets: 107 10*3/uL — ABNORMAL LOW (ref 150–400)
RBC: 4.08 MIL/uL — ABNORMAL LOW (ref 4.22–5.81)
RDW: 12.8 % (ref 11.5–15.5)
WBC: 2.3 10*3/uL — ABNORMAL LOW (ref 4.0–10.5)
nRBC: 0 % (ref 0.0–0.2)

## 2023-09-30 LAB — MAGNESIUM: Magnesium: 2.3 mg/dL (ref 1.7–2.4)

## 2023-09-30 MED ORDER — SODIUM CHLORIDE 0.9 % IV SOLN: 375.0000 mg/m2 | Freq: Once | INTRAVENOUS | Status: DC

## 2023-09-30 MED ORDER — CETIRIZINE HCL 10 MG/ML IV SOLN
10.0000 mg | Freq: Once | INTRAVENOUS | Status: AC
Start: 2023-09-30 — End: 2023-09-30
  Administered 2023-09-30: 10 mg via INTRAVENOUS
  Filled 2023-09-30: qty 1

## 2023-09-30 MED ORDER — SODIUM CHLORIDE 0.9 % IV SOLN: Freq: Once | INTRAVENOUS | Status: AC

## 2023-09-30 MED ORDER — LIDOCAINE-PRILOCAINE 2.5-2.5 % EX CREA: TOPICAL_CREAM | CUTANEOUS | 3 refills | Status: DC

## 2023-09-30 MED ORDER — HEPARIN SOD (PORK) LOCK FLUSH 100 UNIT/ML IV SOLN
500.0000 [IU] | Freq: Once | INTRAVENOUS | Status: AC | PRN
Start: 2023-09-30 — End: 2023-09-30
  Administered 2023-09-30: 500 [IU]

## 2023-09-30 MED ORDER — SODIUM CHLORIDE 0.9% FLUSH
10.0000 mL | INTRAVENOUS | Status: DC | PRN
  Administered 2023-09-30: 10 mL

## 2023-09-30 MED ORDER — FAMOTIDINE IN NACL 20-0.9 MG/50ML-% IV SOLN
20.0000 mg | Freq: Once | INTRAVENOUS | Status: AC
  Administered 2023-09-30: 20 mg via INTRAVENOUS
  Filled 2023-09-30: qty 50

## 2023-09-30 MED ORDER — SODIUM CHLORIDE 0.9 % IV SOLN
375.0000 mg/m2 | Freq: Once | INTRAVENOUS | Status: AC
  Administered 2023-09-30: 900 mg via INTRAVENOUS
  Filled 2023-09-30: qty 50

## 2023-09-30 MED ORDER — SODIUM CHLORIDE 0.9% FLUSH
10.0000 mL | Freq: Once | INTRAVENOUS | Status: AC
  Administered 2023-09-30: 10 mL via INTRAVENOUS

## 2023-09-30 MED ORDER — ACETAMINOPHEN 325 MG PO TABS
650.0000 mg | ORAL_TABLET | Freq: Once | ORAL | Status: AC
  Administered 2023-09-30: 650 mg via ORAL
  Filled 2023-09-30: qty 2

## 2023-09-30 NOTE — Patient Instructions (Signed)
[facility]  Discharge Instructions: Thank you for choosing [facility] to provide your oncology and hematology care.  If you have a lab appointment with the Cancer Center - please note that after April 8th, 2024, all labs will be drawn in the cancer center.  You do not have to check in or register with the main entrance as you have in the past but will complete your check-in in the cancer center.  Wear comfortable clothing and clothing appropriate for easy access to any Portacath or PICC line.   We strive to give you quality time with your provider. You may need to reschedule your appointment if you arrive late (15 or more minutes).  Arriving late affects you and other patients whose appointments are after yours.  Also, if you miss three or more appointments without notifying the office, you may be dismissed from the clinic at the provider's discretion.      For prescription refill requests, have your pharmacy contact our office and allow 72 hours for refills to be completed.    Today you received the following chemotherapy and/or immunotherapy agents Rituximab, return as scheduled.   To help prevent nausea and vomiting after your treatment, we encourage you to take your nausea medication as directed.  BELOW ARE SYMPTOMS THAT SHOULD BE REPORTED IMMEDIATELY: *FEVER GREATER THAN 100.4 F (38 C) OR HIGHER *CHILLS OR SWEATING *NAUSEA AND VOMITING THAT IS NOT CONTROLLED WITH YOUR NAUSEA MEDICATION *UNUSUAL SHORTNESS OF BREATH *UNUSUAL BRUISING OR BLEEDING *URINARY PROBLEMS (pain or burning when urinating, or frequent urination) *BOWEL PROBLEMS (unusual diarrhea, constipation, pain near the anus) TENDERNESS IN MOUTH AND THROAT WITH OR WITHOUT PRESENCE OF ULCERS (sore throat, sores in mouth, or a toothache) UNUSUAL RASH, SWELLING OR PAIN  UNUSUAL VAGINAL DISCHARGE OR ITCHING   Items with * indicate a potential emergency and should be followed up as soon as  possible or go to the Emergency Department if any problems should occur.  Please show the CHEMOTHERAPY ALERT CARD or IMMUNOTHERAPY ALERT CARD at check-in to the Emergency Department and triage nurse.  Should you have questions after your visit or need to cancel or reschedule your appointment, please contact [facility] [phone]  and follow the prompts.  Office hours are 8:00 a.m. to 4:30 p.m. Monday - Friday. Please note that voicemails left after 4:00 p.m. may not be returned until the following business day.  We are closed weekends and major holidays. You have access to a nurse at all times for urgent questions. Please call the main number to the clinic [phone] and follow the prompts.  For any non-urgent questions, you may also contact your provider using MyChart. We now offer e-Visits for anyone 13 and older to request care online for non-urgent symptoms. For details visit [email or web address].   Also download the MyChart app! Go to the app store, search "MyChart", open the app, select [place], and log in with your MyChart username and password.

## 2023-09-30 NOTE — Progress Notes (Signed)
Rapid Infusion Rituximab Pharmacist Evaluation  Travis Wood is a 73 y.o. male being treated with rituximab for NHL. This patient may be considered for RIR.   A pharmacist has verified the patient tolerated rituximab infusions per the [facility] standard infusion protocol without grade 3-4 infusion reactions. The treatment plan will be updated to reflect RIR if the patient qualifies per the checklist below:   Age > 66 years old Yes   Clinically significant cardiovascular disease No   Circulating lymphocyte count < 5000/uL prior to cycle two No  Lab Results  Component Value Date   LYMPHSABS 0.9 09/30/2023    Prior documented grade 3-4 infusion reaction to rituximab No   Prior documented grade 1-2 infusion reaction to rituximab (If YES, Pharmacist will confirm with Physician if patient is still a candidate for RIR) No   Previous rituximab infusion within the past 6 months Yes   Treatment Plan updated orders to reflect RIR Yes    Travis Wood does meet the criteria for Rapid Infusion Rituximab. This patient is going to be switched to rapid infusion rituximab.   Travis Wood 09/30/23 9:36 AM

## 2023-09-30 NOTE — Progress Notes (Signed)
Patient presents today for Rituximab, patient okay for treatment. Patient tolerated therapy with no complaints voiced. Side effects with management reviewed with understanding verbalized. Port site clean and dry with no bruising or swelling noted at site. Good blood return noted before and after administration of therapy. Band aid applied. Patient left in satisfactory condition with VSS and no s/s of distress noted.

## 2023-10-15 ENCOUNTER — Other Ambulatory Visit: Payer: Self-pay

## 2023-11-30 ENCOUNTER — Inpatient Hospital Stay: Payer: Medicare Other

## 2023-11-30 ENCOUNTER — Inpatient Hospital Stay: Payer: Medicare Other | Attending: Hematology | Admitting: Hematology

## 2023-11-30 VITALS — BP 131/73 | HR 55 | Temp 97.6°F | Resp 18 | Ht 75.0 in

## 2023-11-30 VITALS — BP 137/67 | HR 53 | Temp 96.6°F | Resp 20 | Wt 224.4 lb

## 2023-11-30 DIAGNOSIS — Z95828 Presence of other vascular implants and grafts: Secondary | ICD-10-CM

## 2023-11-30 DIAGNOSIS — Z5112 Encounter for antineoplastic immunotherapy: Secondary | ICD-10-CM | POA: Diagnosis present

## 2023-11-30 DIAGNOSIS — C8310 Mantle cell lymphoma, unspecified site: Secondary | ICD-10-CM | POA: Insufficient documentation

## 2023-11-30 DIAGNOSIS — Z79620 Long term (current) use of immunosuppressive biologic: Secondary | ICD-10-CM | POA: Diagnosis not present

## 2023-11-30 DIAGNOSIS — D696 Thrombocytopenia, unspecified: Secondary | ICD-10-CM | POA: Diagnosis not present

## 2023-11-30 DIAGNOSIS — Z87891 Personal history of nicotine dependence: Secondary | ICD-10-CM | POA: Insufficient documentation

## 2023-11-30 DIAGNOSIS — C8318 Mantle cell lymphoma, lymph nodes of multiple sites: Secondary | ICD-10-CM | POA: Diagnosis not present

## 2023-11-30 LAB — CBC WITH DIFFERENTIAL/PLATELET
Abs Immature Granulocytes: 0.1 10*3/uL — ABNORMAL HIGH (ref 0.00–0.07)
Basophils Absolute: 0 10*3/uL (ref 0.0–0.1)
Basophils Relative: 1 %
Eosinophils Absolute: 0 10*3/uL (ref 0.0–0.5)
Eosinophils Relative: 1 %
HCT: 40.4 % (ref 39.0–52.0)
Hemoglobin: 13.7 g/dL (ref 13.0–17.0)
Immature Granulocytes: 3 %
Lymphocytes Relative: 16 %
Lymphs Abs: 0.6 10*3/uL — ABNORMAL LOW (ref 0.7–4.0)
MCH: 30.9 pg (ref 26.0–34.0)
MCHC: 33.9 g/dL (ref 30.0–36.0)
MCV: 91.2 fL (ref 80.0–100.0)
Monocytes Absolute: 0.3 10*3/uL (ref 0.1–1.0)
Monocytes Relative: 8 %
Neutro Abs: 2.5 10*3/uL (ref 1.7–7.7)
Neutrophils Relative %: 71 %
Platelets: 114 10*3/uL — ABNORMAL LOW (ref 150–400)
RBC: 4.43 MIL/uL (ref 4.22–5.81)
RDW: 12 % (ref 11.5–15.5)
WBC: 3.5 10*3/uL — ABNORMAL LOW (ref 4.0–10.5)
nRBC: 0 % (ref 0.0–0.2)

## 2023-11-30 LAB — COMPREHENSIVE METABOLIC PANEL
ALT: 19 U/L (ref 0–44)
AST: 19 U/L (ref 15–41)
Albumin: 4 g/dL (ref 3.5–5.0)
Alkaline Phosphatase: 101 U/L (ref 38–126)
Anion gap: 8 (ref 5–15)
BUN: 18 mg/dL (ref 8–23)
CO2: 26 mmol/L (ref 22–32)
Calcium: 9 mg/dL (ref 8.9–10.3)
Chloride: 105 mmol/L (ref 98–111)
Creatinine, Ser: 0.76 mg/dL (ref 0.61–1.24)
GFR, Estimated: >60 mL/min (ref >60)
Glucose, Bld: 87 mg/dL (ref 70–99)
Potassium: 4 mmol/L (ref 3.5–5.1)
Sodium: 139 mmol/L (ref 135–145)
Total Bilirubin: 0.6 mg/dL (ref 0.0–1.2)
Total Protein: 6.3 g/dL — ABNORMAL LOW (ref 6.5–8.1)

## 2023-11-30 LAB — MAGNESIUM: Magnesium: 2.1 mg/dL (ref 1.7–2.4)

## 2023-11-30 MED ORDER — CETIRIZINE HCL 10 MG/ML IV SOLN
10.0000 mg | Freq: Once | INTRAVENOUS | Status: AC
  Administered 2023-11-30: 10 mg via INTRAVENOUS
  Filled 2023-11-30: qty 1

## 2023-11-30 MED ORDER — SODIUM CHLORIDE 0.9% FLUSH
10.0000 mL | INTRAVENOUS | Status: DC | PRN
  Administered 2023-11-30: 10 mL

## 2023-11-30 MED ORDER — HEPARIN SOD (PORK) LOCK FLUSH 100 UNIT/ML IV SOLN
500.0000 [IU] | Freq: Once | INTRAVENOUS | Status: AC | PRN
  Administered 2023-11-30: 500 [IU]

## 2023-11-30 MED ORDER — ACETAMINOPHEN 325 MG PO TABS
650.0000 mg | ORAL_TABLET | Freq: Once | ORAL | Status: AC
  Administered 2023-11-30: 650 mg via ORAL
  Filled 2023-11-30: qty 2

## 2023-11-30 MED ORDER — SODIUM CHLORIDE 0.9 % IV SOLN
375.0000 mg/m2 | Freq: Once | INTRAVENOUS | Status: AC
  Administered 2023-11-30: 900 mg via INTRAVENOUS
  Filled 2023-11-30: qty 50

## 2023-11-30 MED ORDER — SODIUM CHLORIDE 0.9 % IV SOLN: Freq: Once | INTRAVENOUS | Status: AC

## 2023-11-30 MED ORDER — FAMOTIDINE IN NACL 20-0.9 MG/50ML-% IV SOLN
20.0000 mg | Freq: Once | INTRAVENOUS | Status: AC
  Administered 2023-11-30: 20 mg via INTRAVENOUS
  Filled 2023-11-30: qty 50

## 2023-11-30 MED ORDER — SODIUM CHLORIDE 0.9% FLUSH
10.0000 mL | INTRAVENOUS | Status: DC | PRN
  Administered 2023-11-30: 10 mL via INTRAVENOUS

## 2023-11-30 NOTE — Patient Instructions (Signed)

## 2023-11-30 NOTE — Progress Notes (Signed)
Patients port flushed without difficulty.  Good blood return noted with no bruising or swelling noted at site.  Patient remains accessed for treatment.  

## 2023-11-30 NOTE — Progress Notes (Signed)
Labs reviewed with MD today at office visit. Ok to treat today per MD  Treatment given per orders. Patient tolerated it well without problems. Vitals stable and discharged home from clinic ambulatory. Follow up as scheduled.

## 2023-11-30 NOTE — Progress Notes (Signed)
[facility] [address], [place] [address]    Clinic Day:  11/30/2023  Referring physician: Doreatha Massed, MD  Patient Care Team: Doreatha Massed, MD as PCP - General (Hematology) Doreatha Massed, MD as Medical Oncologist (Medical Oncology) Therese Sarah, RN as Oncology Nurse Navigator (Medical Oncology)   ASSESSMENT & PLAN:   Assessment: 1.  Chronic lymphocytic leukemia: - Flow cytometry (01/26/2019): Monoclonal B-cell population consistent with CLL. - CT chest (12/12/2022): Large right hilar mass/bulky adenopathy measuring 7.5 x 6.4 cm encasing the right mainstem bronchus and lobar branches.  Additional mediastinal adenopathy, axillary and lower cervical adenopathy. - CT [facility] (12/12/2022): Severe splenomegaly measuring 21.5 cm.  Prominent retroperitoneal lymph nodes, left retroperitoneal nodes measuring 1.6 x 1.0 cm.  Subcapsular hypodense lesion of the peripheral liver dome measuring 1.6 x 1.1 cm.  Additional subcentimeter hypodense lesions too small to characterize. - No B symptoms.  Patient plays golf 3-4 times per week.  No recurrent infections. - CLL FISH panel with 13 q. deletion - T p53 mutation negative - IGHV hyper mutation present. - PET scan (01/08/2023): Marked right hilar adenopathy with lymph node conglomerate/large lymph node measuring 5 cm with SUV of 12.3.  There is hypermetabolic adenopathy above and below diaphragm with splenomegaly. - Bronchoscopy (01/28/2023) and biopsy of the right hilar mass by Dr. Tonia Brooms - Pathology: CD5 positive B-cell lymphoproliferative disorder, cyclin D1 positive, favoring mantle cell lymphoma.  FISH for t(11:14) positive indicating mantle cell lymphoma. - 6 cycles of Bendamustine and rituximab from 03/02/2023 through 07/21/2023 - Posttreatment PET scan (08/27/2023): No findings of recurrent lymphoma.  Spleen decreased in size measuring 12 x 10 cm. - Maintenance rituximab started on 09/30/2023   2.   Social/family history: - Lives at home with his girlfriend.  Independent of ADLs and IADLs. - He retired 8 years ago after working at [employer] in [place].  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  Quit smoking in 1992.  Smoked 1 pack/day for 10 years. - Sister had breast cancer.  Another sister had ovarian and stomach cancer.  Father had colon cancer.  Mother had CLL.  Paternal aunt had lung cancer.  Another paternal aunt had breast cancer.    Plan: 1.  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 - Labs today: Normal LFTs and creatinine.  CBC with mild leukopenia and thrombocytopenia.  ANC is normal. - He does not report any fevers, night sweats or weight loss in the last 4 months.  He has tolerated first infusion of right external very well.  He does not report any infections since last visit. - He will proceed with maintenance rituximab today.  RTC 2 months for follow-up.  Will plan on doing a CT CAP with contrast prior to next visit along with labs.  Will continue to monitor with CT CAP with contrast every 6 months.    No orders of the defined types were placed in this encounter.     I,Katie Daubenspeck,acting as a Neurosurgeon for Doreatha Massed, MD.,have documented all relevant documentation on the behalf of Doreatha Massed, MD,as directed by  Doreatha Massed, MD while in the presence of Doreatha Massed, MD.   I, Doreatha Massed MD, have reviewed the above documentation for accuracy and completeness, and I agree with the above.   Doreatha Massed, MD   12/30/202410:54 AM  CHIEF COMPLAINT:   Diagnosis: mantle cell lymphoma    Cancer Staging  Mantle cell lymphoma (HCC) Staging form: Hodgkin and Non-Hodgkin Lymphoma, AJCC 8th Edition - Clinical stage from 02/11/2023: Stage III (Mantle cell lymphoma) - Signed by Doreatha Massed, MD on 02/11/2023    Prior Therapy: Bendamustine and  rituximab every 28 days for 6 cycles, 03/02/23 - 07/21/23  Current Therapy:  maintenance rituximab    HISTORY OF PRESENT ILLNESS:   Oncology History  Mantle cell lymphoma (HCC)  02/11/2023 Initial Diagnosis   Mantle cell lymphoma (HCC)   02/11/2023 Cancer Staging   Staging form: Hodgkin and Non-Hodgkin Lymphoma, AJCC 8th Edition - Clinical stage from 02/11/2023: Stage III (Mantle cell lymphoma) - Signed by Doreatha Massed, MD on 02/11/2023 Histopathologic type: Mantle cell lymphoma (Includes all variants: blastic, pleomorphic, small cell) Stage prefix: Initial diagnosis   03/02/2023 - 07/22/2023 Chemotherapy   Patient is on Treatment Plan : NON-HODGKINS LYMPHOMA Rituximab D1 + Bendamustine D1,2 q28d x 6 cycles     09/30/2023 -  Chemotherapy   Patient is on Treatment Plan : NON-HODGKINS LYMPHOMA Rituximab q60d Maintenance        INTERVAL HISTORY:   Travis Wood is a 72 y.o. male presenting to clinic today for follow up of mantle cell lymphoma. He was last seen by me on 09/03/23.  Today, he states that he is doing well overall. His appetite level is at 100%. His energy level is at 90%.  PAST MEDICAL HISTORY:   Past Medical History: Past Medical History:  Diagnosis Date   Anemia    Cataract    CLL (chronic lymphocytic leukemia) (HCC)    Erectile dysfunction    History of bronchitis    History of colon polyps    Hyperlipemia    Osteoarthritis    Shortness of breath dyspnea    with exertion    Surgical History: Past Surgical History:  Procedure Laterality Date   APPENDECTOMY     BRONCHIAL NEEDLE ASPIRATION BIOPSY  01/28/2023   Procedure: BRONCHIAL NEEDLE ASPIRATION BIOPSIES;  Surgeon: Josephine Igo, DO;  Location: [facility];  Service: Cardiopulmonary;;   CATARACT EXTRACTION W/PHACO Left 10/15/2015   Procedure: CATARACT EXTRACTION PHACO AND INTRAOCULAR LENS PLACEMENT LEFT EYE;  Surgeon: Gemma Payor, MD;  Location: [facility];  Service: Ophthalmology;  Laterality: Left;  CDE:12.20    CATARACT EXTRACTION W/PHACO Right 11/12/2015   Procedure: CATARACT EXTRACTION PHACO AND INTRAOCULAR LENS PLACEMENT RIGHT EYE:  CDE:  8.99;  Surgeon: Gemma Payor, MD;  Location: [facility];  Service: Ophthalmology;  Laterality: Right;   IR IMAGING GUIDED PORT INSERTION  03/04/2023   KNEE SURGERY     Left x 2    MASS EXCISION N/A 09/12/2015   Procedure: EXCISION SOFT TISSUE NEOPLASM (6 CM), BACK;  Surgeon: Franky Macho, MD;  Location: [facility];  Service: General;  Laterality: N/A;   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   Procedure: TOTAL KNEE ARTHROPLASTY;  Surgeon: Durene Romans, MD;  Location: [facility];  Service: Orthopedics;  Laterality: Right;  70 mins   VIDEO BRONCHOSCOPY WITH ENDOBRONCHIAL ULTRASOUND Bilateral 01/28/2023   Procedure: VIDEO BRONCHOSCOPY WITH ENDOBRONCHIAL ULTRASOUND;  Surgeon: Josephine Igo, DO;  Location: [facility];  Service: Cardiopulmonary;  Laterality: Bilateral;    Social History: Social History   Socioeconomic History   Marital status: Media planner    Spouse name: Not on file   Number of children: 2   Years of education: 12 years   Highest education level: 12th grade  Occupational History   Occupation: RETIRED  Tobacco Use   Smoking status: Former  Current packs/day: 0.00    Average packs/day: 1 pack/day for 10.0 years (10.0 ttl pk-yrs)    Types: Cigarettes, Pipe    Start date: 09/09/1981    Quit date: 09/10/1991    Years since quitting: 32.2   Smokeless tobacco: Never   Tobacco comments:    Smoked a pipe occasionally for 3 years.  Vaping Use   Vaping status: Never Used  Substance and Sexual Activity   Alcohol use: Not Currently    Alcohol/week: 1.0 standard drink of alcohol    Types: 1 Cans of beer per week   Drug use: Yes    Frequency: 1.0 times per week    Types: Marijuana   Sexual activity: Yes  Other Topics Concern   Not on file  Social History Narrative   2 sons who live close by   Lives with girlfriend   Social Drivers of  Corporate investment banker Strain: Low Risk  (11/06/2022)   Overall Financial Resource Strain (CARDIA)    Difficulty of Paying Living Expenses: Not hard at all  Food Insecurity: No Food Insecurity (04/20/2023)   Hunger Vital Sign    Worried About Running Out of Food in the Last Year: Never true    Ran Out of Food in the Last Year: Never true  Transportation Needs: No Transportation Needs (04/20/2023)   PRAPARE - Administrator, Civil Service (Medical): No    Lack of Transportation (Non-Medical): No  Physical Activity: Sufficiently Active (11/06/2022)   Exercise Vital Sign    Days of Exercise per Week: 7 days    Minutes of Exercise per Session: 30 min  Stress: No Stress Concern Present (11/06/2022)   [employer] of Occupational Health - Occupational Stress Questionnaire    Feeling of Stress : Not at all  Social Connections: Socially Integrated (11/06/2022)   Social Connection and Isolation Panel [NHANES]    Frequency of Communication with Friends and Family: More than three times a week    Frequency of Social Gatherings with Friends and Family: More than three times a week    Attends Religious Services: 1 to 4 times per year    Active Member of [employer] or Organizations: Yes    Attends Engineer, structural: More than 4 times per year    Marital Status: Living with partner  Intimate Partner Violence: Not At Risk (04/20/2023)   Humiliation, Afraid, Rape, and Kick questionnaire    Fear of Current or Ex-Partner: No    Emotionally Abused: No    Physically Abused: No    Sexually Abused: No    Family History: Family History  Problem Relation Age of Onset   Colon cancer Father        age 37    Colon polyps Father    Heart disease Father        Heart failure   Heart disease Mother    Cancer Sister        Uterine, and ovarian   Hypertension Brother    Obesity Sister    Heart disease Maternal Grandmother    Esophageal cancer Neg Hx    Liver cancer Neg Hx     Pancreatic cancer Neg Hx    Rectal cancer Neg Hx    Stomach cancer Neg Hx     Current Medications:  Current Outpatient Medications:    antiseptic oral rinse (BIOTENE) LIQD, 15 mLs by Mouth Rinse route as needed for dry mouth., Disp: , Rfl:    atorvastatin (  LIPITOR) 40 MG tablet, Take 1 tablet (40 mg total) by mouth daily., Disp: 90 tablet, Rfl: 3   BENDAMUSTINE HCL IV, Inject into the vein every 28 (twenty-eight) days. Days 1 and 2 every 28 days, Disp: , Rfl:    ferrous sulfate 325 (65 FE) MG tablet, Take 325 mg by mouth daily with breakfast., Disp: , Rfl:    lidocaine-prilocaine (EMLA) cream, Apply to affected area once, Disp: 30 g, Rfl: 3   Multiple Vitamin (MULTIVITAMIN WITH MINERALS) TABS tablet, Take 1 tablet by mouth daily., Disp: , Rfl:    OLIVE LEAF EXTRACT PO, Take 1 tablet by mouth in the morning and at bedtime., Disp: , Rfl:    OVER THE COUNTER MEDICATION, Take 1 each by mouth 5 (five) times daily. [name], Disp: , Rfl:    riTUXimab (RITUXAN IV), Inject into the vein every 28 (twenty-eight) days., Disp: , Rfl:    Vitamin D, Ergocalciferol, (DRISDOL) 1.25 MG (50000 UNIT) CAPS capsule, Take 1 capsule (50,000 Units total) by mouth every 7 (seven) days., Disp: 13 capsule, Rfl: 3   Allergies: No Known Allergies  REVIEW OF SYSTEMS:   Review of Systems  Constitutional:  Negative for chills, fatigue and fever.  HENT:   Negative for lump/mass, mouth sores, nosebleeds, sore throat and trouble swallowing.   Eyes:  Negative for eye problems.  Respiratory:  Negative for cough and shortness of breath.   Cardiovascular:  Negative for chest pain, leg swelling and palpitations.  Gastrointestinal:  Negative for abdominal pain, constipation, diarrhea, nausea and vomiting.  Genitourinary:  Negative for bladder incontinence, difficulty urinating, dysuria, frequency, hematuria and nocturia.   Musculoskeletal:  Negative for arthralgias, back pain, flank pain, myalgias and neck pain.  Skin:   Negative for itching and rash.  Neurological:  Negative for dizziness, headaches and numbness.  Hematological:  Does not bruise/bleed easily.  Psychiatric/Behavioral:  Negative for depression, sleep disturbance and suicidal ideas. The patient is not nervous/anxious.   All other systems reviewed and are negative.    VITALS:   There were no vitals taken for this visit.  Wt Readings from Last 3 Encounters:  11/30/23 224 lb 6.4 oz (101.8 kg)  09/30/23 218 lb 14.4 oz (99.3 kg)  09/03/23 214 lb 3.2 oz (97.2 kg)    There is no height or weight on file to calculate BMI.  Performance status (ECOG): 1 - Symptomatic but completely ambulatory  PHYSICAL EXAM:   Physical Exam Vitals and nursing note reviewed. Exam conducted with a chaperone present.  Constitutional:      Appearance: Normal appearance.  Cardiovascular:     Rate and Rhythm: Normal rate and regular rhythm.     Pulses: Normal pulses.     Heart sounds: Normal heart sounds.  Pulmonary:     Effort: Pulmonary effort is normal.     Breath sounds: Normal breath sounds.  Abdominal:     Palpations: Abdomen is soft. There is no hepatomegaly, splenomegaly or mass.     Tenderness: There is no abdominal tenderness.  Musculoskeletal:     Right lower leg: No edema.     Left lower leg: No edema.  Lymphadenopathy:     Cervical: No cervical adenopathy.     Right cervical: No superficial, deep or posterior cervical adenopathy.    Left cervical: No superficial, deep or posterior cervical adenopathy.     Upper Body:     Right upper body: No supraclavicular or axillary adenopathy.     Left upper body: No  supraclavicular or axillary adenopathy.  Neurological:     General: No focal deficit present.     Mental Status: He is alert and oriented to person, place, and time.  Psychiatric:        Mood and Affect: Mood normal.        Behavior: Behavior normal.     LABS:   CBC     Component Value Date/Time   WBC 3.5 (L) 11/30/2023 1008    RBC 4.43 11/30/2023 1008   HGB 13.7 11/30/2023 1008   HGB CANCELED 11/12/2022 1034   HCT 40.4 11/30/2023 1008   HCT CANCELED 11/12/2022 1034   PLT 114 (L) 11/30/2023 1008   PLT CANCELED 11/12/2022 1034   MCV 91.2 11/30/2023 1008   MCV CANCELED 11/12/2022 1034   MCH 30.9 11/30/2023 1008   MCHC 33.9 11/30/2023 1008   RDW 12.0 11/30/2023 1008   RDW CANCELED 11/12/2022 1034   LYMPHSABS 0.6 (L) 11/30/2023 1008   LYMPHSABS CANCELED 11/12/2022 1034   MONOABS 0.3 11/30/2023 1008   EOSABS 0.0 11/30/2023 1008   EOSABS CANCELED 11/12/2022 1034   BASOSABS 0.0 11/30/2023 1008   BASOSABS CANCELED 11/12/2022 1034    CMP      Component Value Date/Time   NA 139 11/30/2023 1008   NA 143 11/12/2022 1034   K 4.0 11/30/2023 1008   CL 105 11/30/2023 1008   CO2 26 11/30/2023 1008   GLUCOSE 87 11/30/2023 1008   BUN 18 11/30/2023 1008   BUN 16 11/12/2022 1034   CREATININE 0.76 11/30/2023 1008   CALCIUM 9.0 11/30/2023 1008   PROT 6.3 (L) 11/30/2023 1008   PROT 6.0 11/12/2022 1034   ALBUMIN 4.0 11/30/2023 1008   ALBUMIN 4.5 11/12/2022 1034   AST 19 11/30/2023 1008   ALT 19 11/30/2023 1008   ALKPHOS 101 11/30/2023 1008   BILITOT 0.6 11/30/2023 1008   BILITOT 0.5 11/12/2022 1034   GFRNONAA >60 11/30/2023 1008   GFRAA 96 05/30/2020 0932     No results found for: "CEA1", "CEA" / No results found for: "CEA1", "CEA" Lab Results  Component Value Date   PSA1 0.6 06/06/2020   No results found for: "QQV956" No results found for: "CAN125"  Lab Results  Component Value Date   TOTALPROTELP 6.0 01/26/2019   ALBUMINELP 3.9 01/26/2019   A1GS 0.2 01/26/2019   A2GS 0.5 01/26/2019   BETS 0.9 01/26/2019   GAMS 0.5 01/26/2019   MSPIKE Not Observed 01/26/2019   SPEI Comment 01/26/2019   Lab Results  Component Value Date   TIBC 310 08/21/2021   FERRITIN 421 (H) 08/21/2021   IRONPCTSAT 20 08/21/2021   Lab Results  Component Value Date   LDH 129 08/27/2023   LDH 128 07/21/2023   LDH 120  06/23/2023     STUDIES:   No results found.

## 2023-11-30 NOTE — Progress Notes (Signed)
Patient has been examined by Dr. Katragadda. Vital signs and labs have been reviewed by MD - ANC, Creatinine, LFTs, hemoglobin, and platelets are within treatment parameters per M.D. - pt may proceed with treatment.  Primary RN and pharmacy notified.  

## 2023-11-30 NOTE — Patient Instructions (Signed)
[facility] [place] - A DEPT OF [facility]  Discharge Instructions: Thank you for choosing [facility] to provide your oncology and hematology care.  If you have a lab appointment with the Cancer Center - please note that after April 8th, 2024, all labs will be drawn in the cancer center.  You do not have to check in or register with the main entrance as you have in the past but will complete your check-in in the cancer center.  Wear comfortable clothing and clothing appropriate for easy access to any Portacath or PICC line.   We strive to give you quality time with your provider. You may need to reschedule your appointment if you arrive late (15 or more minutes).  Arriving late affects you and other patients whose appointments are after yours.  Also, if you miss three or more appointments without notifying the office, you may be dismissed from the clinic at the provider's discretion.      For prescription refill requests, have your pharmacy contact our office and allow 72 hours for refills to be completed.    Today you received the following chemotherapy and/or immunotherapy agents Rituxumab   To help prevent nausea and vomiting after your treatment, we encourage you to take your nausea medication as directed.  BELOW ARE SYMPTOMS THAT SHOULD BE REPORTED IMMEDIATELY: *FEVER GREATER THAN 100.4 F (38 C) OR HIGHER *CHILLS OR SWEATING *NAUSEA AND VOMITING THAT IS NOT CONTROLLED WITH YOUR NAUSEA MEDICATION *UNUSUAL SHORTNESS OF BREATH *UNUSUAL BRUISING OR BLEEDING *URINARY PROBLEMS (pain or burning when urinating, or frequent urination) *BOWEL PROBLEMS (unusual diarrhea, constipation, pain near the anus) TENDERNESS IN MOUTH AND THROAT WITH OR WITHOUT PRESENCE OF ULCERS (sore throat, sores in mouth, or a toothache) UNUSUAL RASH, SWELLING OR PAIN  UNUSUAL VAGINAL DISCHARGE OR ITCHING   Items with * indicate a potential emergency and should be followed up as  soon as possible or go to the Emergency Department if any problems should occur.  Please show the CHEMOTHERAPY ALERT CARD or IMMUNOTHERAPY ALERT CARD at check-in to the Emergency Department and triage nurse.  Should you have questions after your visit or need to cancel or reschedule your appointment, please contact [facility] [place] [facility] [place] - A DEPT OF [facility] [phone]  and follow the prompts.  Office hours are 8:00 a.m. to 4:30 p.m. Monday - Friday. Please note that voicemails left after 4:00 p.m. may not be returned until the following business day.  We are closed weekends and major holidays. You have access to a nurse at all times for urgent questions. Please call the main number to the clinic [phone] and follow the prompts.  For any non-urgent questions, you may also contact your provider using MyChart. We now offer e-Visits for anyone 13 and older to request care online for non-urgent symptoms. For details visit [email or web address].   Also download the MyChart app! Go to the app store, search "MyChart", open the app, select [place], and log in with your MyChart username and password.

## 2023-12-01 ENCOUNTER — Other Ambulatory Visit: Payer: Self-pay

## 2023-12-07 ENCOUNTER — Other Ambulatory Visit: Payer: Self-pay

## 2023-12-11 ENCOUNTER — Other Ambulatory Visit: Payer: Self-pay

## 2024-01-22 ENCOUNTER — Ambulatory Visit (HOSPITAL_COMMUNITY)
Admission: RE | Admit: 2024-01-22 | Discharge: 2024-01-22 | Disposition: A | Discharge status: Home / Self Care | Payer: Medicare Other | Source: Ambulatory Visit | Attending: Hematology | Admitting: Hematology

## 2024-01-22 DIAGNOSIS — C8318 Mantle cell lymphoma, lymph nodes of multiple sites: Secondary | ICD-10-CM | POA: Diagnosis present

## 2024-01-22 MED ORDER — IOHEXOL 300 MG/ML  SOLN
100.0000 mL | Freq: Once | INTRAMUSCULAR | Status: AC | PRN
  Administered 2024-01-22: 100 mL via INTRAVENOUS

## 2024-01-31 NOTE — Progress Notes (Signed)
 [facility] [address], [place] [address]    Clinic Day:  02/01/2024  Referring physician: Doreatha Massed, MD  Patient Care Team: Doreatha Massed, MD as PCP - General (Hematology) Doreatha Massed, MD as Medical Oncologist (Medical Oncology) Therese Sarah, RN as Oncology Nurse Navigator (Medical Oncology)   ASSESSMENT & PLAN:   Assessment: 1.  Chronic lymphocytic leukemia: - Flow cytometry (01/26/2019): Monoclonal B-cell population consistent with CLL. - CT chest (12/12/2022): Large right hilar mass/bulky adenopathy measuring 7.5 x 6.4 cm encasing the right mainstem bronchus and lobar branches.  Additional mediastinal adenopathy, axillary and lower cervical adenopathy. - CT [facility] (12/12/2022): Severe splenomegaly measuring 21.5 cm.  Prominent retroperitoneal lymph nodes, left retroperitoneal nodes measuring 1.6 x 1.0 cm.  Subcapsular hypodense lesion of the peripheral liver dome measuring 1.6 x 1.1 cm.  Additional subcentimeter hypodense lesions too small to characterize. - No B symptoms.  Patient plays golf 3-4 times per week.  No recurrent infections. - CLL FISH panel with 13 q. deletion - T p53 mutation negative - IGHV hyper mutation present. - PET scan (01/08/2023): Marked right hilar adenopathy with lymph node conglomerate/large lymph node measuring 5 cm with SUV of 12.3.  There is hypermetabolic adenopathy above and below diaphragm with splenomegaly. - Bronchoscopy (01/28/2023) and biopsy of the right hilar mass by Dr. Tonia Brooms - Pathology: CD5 positive B-cell lymphoproliferative disorder, cyclin D1 positive, favoring mantle cell lymphoma.  FISH for t(11:14) positive indicating mantle cell lymphoma. - 6 cycles of Bendamustine and rituximab from 03/02/2023 through 07/21/2023 - Posttreatment PET scan (08/27/2023): No findings of recurrent lymphoma.  Spleen decreased in size measuring 12 x 10 cm. - Maintenance rituximab started on 09/30/2023   2.   Social/family history: - Lives at home with his girlfriend.  Independent of ADLs and IADLs. - He retired 8 years ago after working at [employer] in [place].  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  Quit smoking in 1992.  Smoked 1 pack/day for 10 years. - Sister had breast cancer.  Another sister had ovarian and stomach cancer.  Father had colon cancer.  Mother had CLL.  Paternal aunt had lung cancer.  Another paternal aunt had breast cancer.    Plan: 1.  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 - He does not report any infections in the past few months.  He is being active and plays golf most days of the week. - Reviewed labs today: Normal LFTs and creatinine.  CBC with mild thrombocytopenia stable. - CT CAP (01/22/2024): No evidence of active lymphoma.  Matted right hilar lymph node tissue 19 x 12 mm similar to PET scan in September 2024 without FDG activity.  Colonic diverticulosis. - Proceed with rituximab dose today.  RTC 2 months for follow-up.    No orders of the defined types were placed in this encounter.     I,Katie Daubenspeck,acting as a Neurosurgeon for Doreatha Massed, MD.,have documented all relevant documentation on the behalf of Doreatha Massed, MD,as directed by  Doreatha Massed, MD while in the presence of Doreatha Massed, MD.   I, Doreatha Massed MD, have reviewed the above documentation for accuracy and completeness, and I agree with the above.   Doreatha Massed, MD   3/3/202511:06 AM  CHIEF COMPLAINT:   Diagnosis: mantle cell lymphoma    Cancer Staging  Mantle cell lymphoma (HCC) Staging form: Hodgkin and  Non-Hodgkin Lymphoma, AJCC 8th Edition - Clinical stage from 02/11/2023: Stage III (Mantle cell lymphoma) - Signed by Doreatha Massed, MD on 02/11/2023    Prior Therapy: Bendamustine and rituximab every 28 days for 6 cycles, 03/02/23 -  07/21/23   Current Therapy:  maintenance rituximab    HISTORY OF PRESENT ILLNESS:   Oncology History  Mantle cell lymphoma (HCC)  02/11/2023 Initial Diagnosis   Mantle cell lymphoma (HCC)   02/11/2023 Cancer Staging   Staging form: Hodgkin and Non-Hodgkin Lymphoma, AJCC 8th Edition - Clinical stage from 02/11/2023: Stage III (Mantle cell lymphoma) - Signed by Doreatha Massed, MD on 02/11/2023 Histopathologic type: Mantle cell lymphoma (Includes all variants: blastic, pleomorphic, small cell) Stage prefix: Initial diagnosis   03/02/2023 - 07/22/2023 Chemotherapy   Patient is on Treatment Plan : NON-HODGKINS LYMPHOMA Rituximab D1 + Bendamustine D1,2 q28d x 6 cycles     09/30/2023 -  Chemotherapy   Patient is on Treatment Plan : NON-HODGKINS LYMPHOMA Rituximab q60d Maintenance        INTERVAL HISTORY:   Travis Wood is a 73 y.o. male presenting to clinic today for follow up of mantle cell lymphoma. He was last seen by me on 11/30/23.  Since his last visit, he underwent restaging CT C/A/P on 01/22/24 showing no evidence of active lymphoma.  Today, he states that he is doing well overall. His appetite level is at 100%. His energy level is at 100%.  PAST MEDICAL HISTORY:   Past Medical History: Past Medical History:  Diagnosis Date   Anemia    Cataract    CLL (chronic lymphocytic leukemia) (HCC)    Erectile dysfunction    History of bronchitis    History of colon polyps    Hyperlipemia    Osteoarthritis    Shortness of breath dyspnea    with exertion    Surgical History: Past Surgical History:  Procedure Laterality Date   APPENDECTOMY     BRONCHIAL NEEDLE ASPIRATION BIOPSY  01/28/2023   Procedure: BRONCHIAL NEEDLE ASPIRATION BIOPSIES;  Surgeon: Josephine Igo, DO;  Location: [facility];  Service: Cardiopulmonary;;   CATARACT EXTRACTION W/PHACO Left 10/15/2015   Procedure: CATARACT EXTRACTION PHACO AND INTRAOCULAR LENS PLACEMENT LEFT EYE;  Surgeon: Gemma Payor, MD;   Location: [facility];  Service: Ophthalmology;  Laterality: Left;  CDE:12.20   CATARACT EXTRACTION W/PHACO Right 11/12/2015   Procedure: CATARACT EXTRACTION PHACO AND INTRAOCULAR LENS PLACEMENT RIGHT EYE:  CDE:  8.99;  Surgeon: Gemma Payor, MD;  Location: [facility];  Service: Ophthalmology;  Laterality: Right;   IR IMAGING GUIDED PORT INSERTION  03/04/2023   KNEE SURGERY     Left x 2    MASS EXCISION N/A 09/12/2015   Procedure: EXCISION SOFT TISSUE NEOPLASM (6 CM), BACK;  Surgeon: Franky Macho, MD;  Location: [facility];  Service: General;  Laterality: N/A;   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   Procedure: TOTAL KNEE ARTHROPLASTY;  Surgeon: Durene Romans, MD;  Location: [facility];  Service: Orthopedics;  Laterality: Right;  70 mins   VIDEO BRONCHOSCOPY WITH ENDOBRONCHIAL ULTRASOUND Bilateral 01/28/2023   Procedure: VIDEO BRONCHOSCOPY WITH ENDOBRONCHIAL ULTRASOUND;  Surgeon: Josephine Igo, DO;  Location: [facility];  Service: Cardiopulmonary;  Laterality: Bilateral;    Social History: Social History   Socioeconomic History   Marital status: Media planner    Spouse name: Not on file   Number of children: 2   Years of education: 12 years   Highest education level: 12th grade  Occupational History  Occupation: RETIRED  Tobacco Use   Smoking status: Former    Current packs/day: 0.00    Average packs/day: 1 pack/day for 10.0 years (10.0 ttl pk-yrs)    Types: Cigarettes, Pipe    Start date: 09/09/1981    Quit date: 09/10/1991    Years since quitting: 32.4   Smokeless tobacco: Never   Tobacco comments:    Smoked a pipe occasionally for 3 years.  Vaping Use   Vaping status: Never Used  Substance and Sexual Activity   Alcohol use: Not Currently    Alcohol/week: 1.0 standard drink of alcohol    Types: 1 Cans of beer per week   Drug use: Yes    Frequency: 1.0 times per week    Types: Marijuana   Sexual activity: Yes  Other Topics Concern   Not on file  Social History Narrative   2  sons who live close by   Lives with girlfriend   Social Drivers of Corporate investment banker Strain: Low Risk  (11/06/2022)   Overall Financial Resource Strain (CARDIA)    Difficulty of Paying Living Expenses: Not hard at all  Food Insecurity: No Food Insecurity (04/20/2023)   Hunger Vital Sign    Worried About Running Out of Food in the Last Year: Never true    Ran Out of Food in the Last Year: Never true  Transportation Needs: No Transportation Needs (04/20/2023)   PRAPARE - Administrator, Civil Service (Medical): No    Lack of Transportation (Non-Medical): No  Physical Activity: Sufficiently Active (11/06/2022)   Exercise Vital Sign    Days of Exercise per Week: 7 days    Minutes of Exercise per Session: 30 min  Stress: No Stress Concern Present (11/06/2022)   [employer] of Occupational Health - Occupational Stress Questionnaire    Feeling of Stress : Not at all  Social Connections: Socially Integrated (11/06/2022)   Social Connection and Isolation Panel [NHANES]    Frequency of Communication with Friends and Family: More than three times a week    Frequency of Social Gatherings with Friends and Family: More than three times a week    Attends Religious Services: 1 to 4 times per year    Active Member of [employer] or Organizations: Yes    Attends Engineer, structural: More than 4 times per year    Marital Status: Living with partner  Intimate Partner Violence: Not At Risk (04/20/2023)   Humiliation, Afraid, Rape, and Kick questionnaire    Fear of Current or Ex-Partner: No    Emotionally Abused: No    Physically Abused: No    Sexually Abused: No    Family History: Family History  Problem Relation Age of Onset   Colon cancer Father        age 71    Colon polyps Father    Heart disease Father        Heart failure   Heart disease Mother    Cancer Sister        Uterine, and ovarian   Hypertension Brother    Obesity Sister    Heart disease Maternal  Grandmother    Esophageal cancer Neg Hx    Liver cancer Neg Hx    Pancreatic cancer Neg Hx    Rectal cancer Neg Hx    Stomach cancer Neg Hx     Current Medications:  Current Outpatient Medications:    antiseptic oral rinse (BIOTENE) LIQD, 15 mLs by Mouth Rinse  route as needed for dry mouth., Disp: , Rfl:    atorvastatin (LIPITOR) 40 MG tablet, Take 1 tablet (40 mg total) by mouth daily., Disp: 90 tablet, Rfl: 3   BENDAMUSTINE HCL IV, Inject into the vein every 28 (twenty-eight) days. Days 1 and 2 every 28 days, Disp: , Rfl:    ferrous sulfate 325 (65 FE) MG tablet, Take 325 mg by mouth daily with breakfast., Disp: , Rfl:    lidocaine-prilocaine (EMLA) cream, Apply to affected area once, Disp: 30 g, Rfl: 3   Multiple Vitamin (MULTIVITAMIN WITH MINERALS) TABS tablet, Take 1 tablet by mouth daily., Disp: , Rfl:    OLIVE LEAF EXTRACT PO, Take 1 tablet by mouth in the morning and at bedtime., Disp: , Rfl:    OVER THE COUNTER MEDICATION, Take 1 each by mouth 5 (five) times daily. [name], Disp: , Rfl:    riTUXimab (RITUXAN IV), Inject into the vein every 28 (twenty-eight) days., Disp: , Rfl:    Vitamin D, Ergocalciferol, (DRISDOL) 1.25 MG (50000 UNIT) CAPS capsule, Take 1 capsule (50,000 Units total) by mouth every 7 (seven) days., Disp: 13 capsule, Rfl: 3 No current facility-administered medications for this visit.  Facility-Administered Medications Ordered in Other Visits:    0.9 %  sodium chloride infusion, , Intravenous, Once, Doreatha Massed, MD   acetaminophen (TYLENOL) tablet 650 mg, 650 mg, Oral, Once, Doreatha Massed, MD   cetirizine (QUZYTTIR) injection 10 mg, 10 mg, Intravenous, Once, Doreatha Massed, MD   famotidine (PEPCID) IVPB 20 mg premix, 20 mg, Intravenous, Once, Doreatha Massed, MD   heparin lock flush 100 unit/mL, 500 Units, Intracatheter, Once PRN, Doreatha Massed, MD   riTUXimab-pvvr (RUXIENCE) 900 mg in sodium chloride 0.9 % 160 mL infusion,  375 mg/m2 (Treatment Plan Recorded), Intravenous, Once, Doreatha Massed, MD   sodium chloride flush (NS) 0.9 % injection 10 mL, 10 mL, Intracatheter, PRN, Doreatha Massed, MD   Allergies: No Known Allergies  REVIEW OF SYSTEMS:   Review of Systems  Constitutional:  Negative for chills, fatigue and fever.  HENT:   Negative for lump/mass, mouth sores, nosebleeds, sore throat and trouble swallowing.   Eyes:  Negative for eye problems.  Respiratory:  Negative for cough and shortness of breath.   Cardiovascular:  Negative for chest pain, leg swelling and palpitations.  Gastrointestinal:  Negative for abdominal pain, constipation, diarrhea, nausea and vomiting.  Genitourinary:  Negative for bladder incontinence, difficulty urinating, dysuria, frequency, hematuria and nocturia.   Musculoskeletal:  Negative for arthralgias, back pain, flank pain, myalgias and neck pain.  Skin:  Negative for itching and rash.  Neurological:  Negative for dizziness, headaches and numbness.  Hematological:  Does not bruise/bleed easily.  Psychiatric/Behavioral:  Negative for depression, sleep disturbance and suicidal ideas. The patient is not nervous/anxious.   All other systems reviewed and are negative.    VITALS:   There were no vitals taken for this visit.  Wt Readings from Last 3 Encounters:  02/01/24 228 lb 13.4 oz (103.8 kg)  11/30/23 224 lb 6.4 oz (101.8 kg)  09/30/23 218 lb 14.4 oz (99.3 kg)    There is no height or weight on file to calculate BMI.  Performance status (ECOG): 1 - Symptomatic but completely ambulatory  PHYSICAL EXAM:   Physical Exam Vitals and nursing note reviewed. Exam conducted with a chaperone present.  Constitutional:      Appearance: Normal appearance.  Cardiovascular:     Rate and Rhythm: Normal rate and regular rhythm.  Pulses: Normal pulses.     Heart sounds: Normal heart sounds.  Pulmonary:     Effort: Pulmonary effort is normal.     Breath sounds:  Normal breath sounds.  Abdominal:     Palpations: Abdomen is soft. There is no hepatomegaly, splenomegaly or mass.     Tenderness: There is no abdominal tenderness.  Musculoskeletal:     Right lower leg: No edema.     Left lower leg: No edema.  Lymphadenopathy:     Cervical: No cervical adenopathy.     Right cervical: No superficial, deep or posterior cervical adenopathy.    Left cervical: No superficial, deep or posterior cervical adenopathy.     Upper Body:     Right upper body: No supraclavicular or axillary adenopathy.     Left upper body: No supraclavicular or axillary adenopathy.  Neurological:     General: No focal deficit present.     Mental Status: He is alert and oriented to person, place, and time.  Psychiatric:        Mood and Affect: Mood normal.        Behavior: Behavior normal.     LABS:   CBC     Component Value Date/Time   WBC 4.9 02/01/2024 0953   RBC 4.28 02/01/2024 0953   HGB 13.3 02/01/2024 0953   HGB CANCELED 11/12/2022 1034   HCT 39.5 02/01/2024 0953   HCT CANCELED 11/12/2022 1034   PLT 120 (L) 02/01/2024 0953   PLT CANCELED 11/12/2022 1034   MCV 92.3 02/01/2024 0953   MCV CANCELED 11/12/2022 1034   MCH 31.1 02/01/2024 0953   MCHC 33.7 02/01/2024 0953   RDW 12.8 02/01/2024 0953   RDW CANCELED 11/12/2022 1034   LYMPHSABS 0.7 02/01/2024 0953   LYMPHSABS CANCELED 11/12/2022 1034   MONOABS 0.4 02/01/2024 0953   EOSABS 0.1 02/01/2024 0953   EOSABS CANCELED 11/12/2022 1034   BASOSABS 0.0 02/01/2024 0953   BASOSABS CANCELED 11/12/2022 1034    CMP      Component Value Date/Time   NA 139 02/01/2024 0953   NA 143 11/12/2022 1034   K 4.2 02/01/2024 0953   CL 103 02/01/2024 0953   CO2 27 02/01/2024 0953   GLUCOSE 91 02/01/2024 0953   BUN 17 02/01/2024 0953   BUN 16 11/12/2022 1034   CREATININE 0.80 02/01/2024 0953   CALCIUM 9.0 02/01/2024 0953   PROT 6.2 (L) 02/01/2024 0953   PROT 6.0 11/12/2022 1034   ALBUMIN 4.0 02/01/2024 0953    ALBUMIN 4.5 11/12/2022 1034   AST 16 02/01/2024 0953   ALT 18 02/01/2024 0953   ALKPHOS 92 02/01/2024 0953   BILITOT 0.7 02/01/2024 0953   BILITOT 0.5 11/12/2022 1034   GFRNONAA >60 02/01/2024 0953   GFRAA 96 05/30/2020 0932     No results found for: "CEA1", "CEA" / No results found for: "CEA1", "CEA" Lab Results  Component Value Date   PSA1 0.6 06/06/2020   No results found for: "ZOX096" No results found for: "CAN125"  Lab Results  Component Value Date   TOTALPROTELP 6.0 01/26/2019   ALBUMINELP 3.9 01/26/2019   A1GS 0.2 01/26/2019   A2GS 0.5 01/26/2019   BETS 0.9 01/26/2019   GAMS 0.5 01/26/2019   MSPIKE Not Observed 01/26/2019   SPEI Comment 01/26/2019   Lab Results  Component Value Date   TIBC 310 08/21/2021   FERRITIN 421 (H) 08/21/2021   IRONPCTSAT 20 08/21/2021   Lab Results  Component Value Date  LDH 129 08/27/2023   LDH 128 07/21/2023   LDH 120 06/23/2023     STUDIES:   CT CHEST ABDOMEN PELVIS W CONTRAST Result Date: 01/22/2024 CLINICAL DATA:  Mantle cell lymphoma of lymph nodes of multiple regions status post chemotherapy, follow-up. * Tracking Code: BO * EXAM: CT CHEST, ABDOMEN, AND PELVIS WITH CONTRAST TECHNIQUE: Multidetector CT imaging of the chest, abdomen and pelvis was performed following the standard protocol during bolus administration of intravenous contrast. RADIATION DOSE REDUCTION: This exam was performed according to the departmental dose-optimization program which includes automated exposure control, adjustment of the mA and/or kV according to patient size and/or use of iterative reconstruction technique. CONTRAST:  OMNIPAQUE IOHEXOL 300 MG/ML  SOLN COMPARISON:  Multiple priors including most recent PET-CT August 27, 2023 FINDINGS: CT CHEST FINDINGS Cardiovascular: Right chest Port-A-Cath with tip near the superior cavoatrial junction. Aortic atherosclerosis. Normal size heart. No significant pericardial effusion/thickening.  Mediastinum/Nodes: No suspicious thyroid nodule. Matted right hilar nodal tissue measures 19 x 12 mm on image 37/2 similar to PET-CT August 27, 2023 without abnormal FDG avidity in this area on that examination compatible with treated lymphoma. No new pathologically enlarged abdominal or pelvic lymph nodes. The esophagus is grossly unremarkable. Lungs/Pleura: Central airways are patent. No suspicious pulmonary nodules or masses. Musculoskeletal: No aggressive lytic or blastic lesion of bone. Multilevel degenerative changes spine. CT ABDOMEN PELVIS FINDINGS Hepatobiliary: No suspicious hepatic lesion. Small focus of probable adenomyomatosis in the gallbladder fundus on image 29/4 no biliary ductal dilation. Pancreas: No pancreatic ductal dilation or evidence of acute inflammation. Spleen: No splenomegaly. Adrenals/Urinary Tract: Bilateral adrenal glands appear normal. No hydronephrosis. Kidneys demonstrate symmetric enhancement. Urinary bladder is unremarkable for degree of distension. Stomach/Bowel: Stomach is unremarkable for degree of distension. Colonic diverticulosis without findings of acute diverticulitis. Vascular/Lymphatic: Aortic atherosclerosis. Retroaortic left renal vein. No pathologically enlarged abdominal or pelvic lymph nodes. Reproductive: Prostate is unremarkable. Other: No significant abdominopelvic free fluid. Musculoskeletal: No aggressive lytic or blastic lesion of bone. Multilevel degenerative changes spine. IMPRESSION: 1. No evidence of active lymphoma in the chest, abdomen or pelvis. 2. Matted right hilar nodal tissue measures 19 x 12 mm similar to PET-CT August 27, 2023 without abnormal FDG avidity in this area on that examination compatible with treated lymphoma. 3. Colonic diverticulosis without findings of acute diverticulitis. 4.  Aortic Atherosclerosis (ICD10-I70.0). Electronically Signed   By: Maudry Mayhew M.D.   On: 01/22/2024 16:33

## 2024-02-01 ENCOUNTER — Inpatient Hospital Stay: Payer: Medicare Other

## 2024-02-01 ENCOUNTER — Inpatient Hospital Stay: Payer: Medicare Other | Attending: Hematology | Admitting: Hematology

## 2024-02-01 ENCOUNTER — Inpatient Hospital Stay (HOSPITAL_BASED_OUTPATIENT_CLINIC_OR_DEPARTMENT_OTHER): Payer: Medicare Other

## 2024-02-01 VITALS — BP 123/70 | HR 60 | Temp 96.6°F | Resp 20 | Wt 228.8 lb

## 2024-02-01 VITALS — BP 120/70 | HR 55 | Resp 18

## 2024-02-01 DIAGNOSIS — C8318 Mantle cell lymphoma, lymph nodes of multiple sites: Secondary | ICD-10-CM | POA: Insufficient documentation

## 2024-02-01 DIAGNOSIS — Z87891 Personal history of nicotine dependence: Secondary | ICD-10-CM | POA: Diagnosis not present

## 2024-02-01 DIAGNOSIS — Z79620 Long term (current) use of immunosuppressive biologic: Secondary | ICD-10-CM | POA: Insufficient documentation

## 2024-02-01 DIAGNOSIS — Z95828 Presence of other vascular implants and grafts: Secondary | ICD-10-CM

## 2024-02-01 DIAGNOSIS — Z5112 Encounter for antineoplastic immunotherapy: Secondary | ICD-10-CM | POA: Insufficient documentation

## 2024-02-01 LAB — CBC WITH DIFFERENTIAL/PLATELET
Abs Immature Granulocytes: 0.01 10*3/uL (ref 0.00–0.07)
Basophils Absolute: 0 10*3/uL (ref 0.0–0.1)
Basophils Relative: 0 %
Eosinophils Absolute: 0.1 10*3/uL (ref 0.0–0.5)
Eosinophils Relative: 1 %
HCT: 39.5 % (ref 39.0–52.0)
Hemoglobin: 13.3 g/dL (ref 13.0–17.0)
Immature Granulocytes: 0 %
Lymphocytes Relative: 14 %
Lymphs Abs: 0.7 10*3/uL (ref 0.7–4.0)
MCH: 31.1 pg (ref 26.0–34.0)
MCHC: 33.7 g/dL (ref 30.0–36.0)
MCV: 92.3 fL (ref 80.0–100.0)
Monocytes Absolute: 0.4 10*3/uL (ref 0.1–1.0)
Monocytes Relative: 9 %
Neutro Abs: 3.7 10*3/uL (ref 1.7–7.7)
Neutrophils Relative %: 76 %
Platelets: 120 10*3/uL — ABNORMAL LOW (ref 150–400)
RBC: 4.28 MIL/uL (ref 4.22–5.81)
RDW: 12.8 % (ref 11.5–15.5)
WBC: 4.9 10*3/uL (ref 4.0–10.5)
nRBC: 0 % (ref 0.0–0.2)

## 2024-02-01 LAB — COMPREHENSIVE METABOLIC PANEL
ALT: 18 U/L (ref 0–44)
AST: 16 U/L (ref 15–41)
Albumin: 4 g/dL (ref 3.5–5.0)
Alkaline Phosphatase: 92 U/L (ref 38–126)
Anion gap: 9 (ref 5–15)
BUN: 17 mg/dL (ref 8–23)
CO2: 27 mmol/L (ref 22–32)
Calcium: 9 mg/dL (ref 8.9–10.3)
Chloride: 103 mmol/L (ref 98–111)
Creatinine, Ser: 0.8 mg/dL (ref 0.61–1.24)
GFR, Estimated: >60 mL/min (ref >60)
Glucose, Bld: 91 mg/dL (ref 70–99)
Potassium: 4.2 mmol/L (ref 3.5–5.1)
Sodium: 139 mmol/L (ref 135–145)
Total Bilirubin: 0.7 mg/dL (ref 0.0–1.2)
Total Protein: 6.2 g/dL — ABNORMAL LOW (ref 6.5–8.1)

## 2024-02-01 LAB — MAGNESIUM: Magnesium: 2.1 mg/dL (ref 1.7–2.4)

## 2024-02-01 MED ORDER — SODIUM CHLORIDE 0.9 % IV SOLN
375.0000 mg/m2 | Freq: Once | INTRAVENOUS | Status: AC
  Administered 2024-02-01: 900 mg via INTRAVENOUS
  Filled 2024-02-01: qty 50

## 2024-02-01 MED ORDER — CETIRIZINE HCL 10 MG/ML IV SOLN
10.0000 mg | Freq: Once | INTRAVENOUS | Status: AC
  Administered 2024-02-01: 10 mg via INTRAVENOUS
  Filled 2024-02-01: qty 1

## 2024-02-01 MED ORDER — HEPARIN SOD (PORK) LOCK FLUSH 100 UNIT/ML IV SOLN
500.0000 [IU] | Freq: Once | INTRAVENOUS | Status: AC | PRN
  Administered 2024-02-01: 500 [IU]

## 2024-02-01 MED ORDER — SODIUM CHLORIDE 0.9 % IV SOLN: Freq: Once | INTRAVENOUS | Status: AC

## 2024-02-01 MED ORDER — SODIUM CHLORIDE 0.9% FLUSH
10.0000 mL | INTRAVENOUS | Status: DC | PRN
Start: 2024-02-01 — End: 2024-02-01
  Administered 2024-02-01: 10 mL via INTRAVENOUS

## 2024-02-01 MED ORDER — FAMOTIDINE IN NACL 20-0.9 MG/50ML-% IV SOLN
20.0000 mg | Freq: Once | INTRAVENOUS | Status: AC
  Administered 2024-02-01: 20 mg via INTRAVENOUS
  Filled 2024-02-01: qty 50

## 2024-02-01 MED ORDER — ACETAMINOPHEN 325 MG PO TABS
650.0000 mg | ORAL_TABLET | Freq: Once | ORAL | Status: AC
  Administered 2024-02-01: 650 mg via ORAL
  Filled 2024-02-01: qty 2

## 2024-02-01 MED ORDER — SODIUM CHLORIDE 0.9 % IV SOLN: 375.0000 mg/m2 | Freq: Once | INTRAVENOUS | Status: DC

## 2024-02-01 MED ORDER — SODIUM CHLORIDE 0.9% FLUSH
10.0000 mL | INTRAVENOUS | Status: DC | PRN
  Administered 2024-02-01: 10 mL

## 2024-02-01 NOTE — Patient Instructions (Signed)
 [facility] - A DEPT OF [facility]  Discharge Instructions: Thank you for choosing [facility] to provide your oncology and hematology care.  If you have a lab appointment with the Cancer Center - please note that after April 8th, 2024, all labs will be drawn in the cancer center.  You do not have to check in or register with the main entrance as you have in the past but will complete your check-in in the cancer center.  Wear comfortable clothing and clothing appropriate for easy access to any Portacath or PICC line.   We strive to give you quality time with your provider. You may need to reschedule your appointment if you arrive late (15 or more minutes).  Arriving late affects you and other patients whose appointments are after yours.  Also, if you miss three or more appointments without notifying the office, you may be dismissed from the clinic at the provider's discretion.      For prescription refill requests, have your pharmacy contact our office and allow 72 hours for refills to be completed.    Today you received the following chemotherapy and/or immunotherapy agents Rituxan.  Rituximab Injection What is this medication? RITUXIMAB (ri TUX i mab) treats leukemia and lymphoma. It works by blocking a protein that causes cancer cells to grow and multiply. This helps to slow or stop the spread of cancer cells. It may also be used to treat autoimmune conditions, such as arthritis. It works by slowing down an overactive immune system. It is a monoclonal antibody. This medicine may be used for other purposes; ask your health care provider or pharmacist if you have questions. COMMON BRAND NAME(S): RIABNI, Rituxan, RUXIENCE, truxima What should I tell my care team before I take this medication? They need to know if you have any of these conditions: Chest pain Heart disease Immune system problems Infection, such as chickenpox, cold sores, hepatitis  B, herpes Irregular heartbeat or rhythm Kidney disease Low blood counts, such as low white cells, platelets, red cells Lung disease Recent or upcoming vaccine An unusual or allergic reaction to rituximab, other medications, foods, dyes, or preservatives Pregnant or trying to get pregnant Breast-feeding How should I use this medication? This medication is injected into a vein. It is given by a care team in a hospital or clinic setting. A special MedGuide will be given to you before each treatment. Be sure to read this information carefully each time. Talk to your care team about the use of this medication in children. While this medication may be prescribed for children as young as 6 months for selected conditions, precautions do apply. Overdosage: If you think you have taken too much of this medicine contact a poison control center or emergency room at once. NOTE: This medicine is only for you. Do not share this medicine with others. What if I miss a dose? Keep appointments for follow-up doses. It is important not to miss your dose. Call your care team if you are unable to keep an appointment. What may interact with this medication? Do not take this medication with any of the following: Live vaccines This medication may also interact with the following: Cisplatin This list may not describe all possible interactions. Give your health care provider a list of all the medicines, herbs, non-prescription drugs, or dietary supplements you use. Also tell them if you smoke, drink alcohol, or use illegal drugs. Some items may interact with your medicine.  What should I watch for while using this medication? Your condition will be monitored carefully while you are receiving this medication. You may need blood work while taking this medication. This medication can cause serious infusion reactions. To reduce the risk your care team may give you other medications to take before receiving this one. Be sure  to follow the directions from your care team. This medication may increase your risk of getting an infection. Call your care team for advice if you get a fever, chills, sore throat, or other symptoms of a cold or flu. Do not treat yourself. Try to avoid being around people who are sick. Call your care team if you are around anyone with measles, chickenpox, or if you develop sores or blisters that do not heal properly. Avoid taking medications that contain aspirin, acetaminophen, ibuprofen, naproxen, or ketoprofen unless instructed by your care team. These medications may hide a fever. This medication may cause serious skin reactions. They can happen weeks to months after starting the medication. Contact your care team right away if you notice fevers or flu-like symptoms with a rash. The rash may be red or purple and then turn into blisters or peeling of the skin. You may also notice a red rash with swelling of the face, lips, or lymph nodes in your neck or under your arms. In some patients, this medication may cause a serious brain infection that may cause death. If you have any problems seeing, thinking, speaking, walking, or standing, tell your care team right away. If you cannot reach your care team, urgently seek another source of medical care. Talk to your care team if you may be pregnant. Serious birth defects can occur if you take this medication during pregnancy and for 12 months after the last dose. You will need a negative pregnancy test before starting this medication. Contraception is recommended while taking this medication and for 12 months after the last dose. Your care team can help you find the option that works for you. Do not breastfeed while taking this medication and for at least 6 months after the last dose. What side effects may I notice from receiving this medication? Side effects that you should report to your care team as soon as possible: Allergic reactions or angioedema--skin  rash, itching or hives, swelling of the face, eyes, lips, tongue, arms, or legs, trouble swallowing or breathing Bowel blockage--stomach cramping, unable to have a bowel movement or pass gas, loss of appetite, vomiting Dizziness, loss of balance or coordination, confusion or trouble speaking Heart attack--pain or tightness in the chest, shoulders, arms, or jaw, nausea, shortness of breath, cold or clammy skin, feeling faint or lightheaded Heart rhythm changes--fast or irregular heartbeat, dizziness, feeling faint or lightheaded, chest pain, trouble breathing Infection--fever, chills, cough, sore throat, wounds that don't heal, pain or trouble when passing urine, general feeling of discomfort or being unwell Infusion reactions--chest pain, shortness of breath or trouble breathing, feeling faint or lightheaded Kidney injury--decrease in the amount of urine, swelling of the ankles, hands, or feet Liver injury--right upper belly pain, loss of appetite, nausea, light-colored stool, dark yellow or brown urine, yellowing skin or eyes, unusual weakness or fatigue Redness, blistering, peeling, or loosening of the skin, including inside the mouth Stomach pain that is severe, does not go away, or gets worse Tumor lysis syndrome (TLS)--nausea, vomiting, diarrhea, decrease in the amount of urine, dark urine, unusual weakness or fatigue, confusion, muscle pain or cramps, fast or irregular heartbeat, joint pain  Side effects that usually do not require medical attention (report to your care team if they continue or are bothersome): Headache Joint pain Nausea Runny or stuffy nose Unusual weakness or fatigue This list may not describe all possible side effects. Call your doctor for medical advice about side effects. You may report side effects to FDA at 1-800-FDA-1088. Where should I keep my medication? This medication is given in a hospital or clinic. It will not be stored at home. NOTE: This sheet is a summary.  It may not cover all possible information. If you have questions about this medicine, talk to your doctor, pharmacist, or health care provider.  2024 Elsevier/Gold Standard (2022-04-10 00:00:00)      To help prevent nausea and vomiting after your treatment, we encourage you to take your nausea medication as directed.  BELOW ARE SYMPTOMS THAT SHOULD BE REPORTED IMMEDIATELY: *FEVER GREATER THAN 100.4 F (38 C) OR HIGHER *CHILLS OR SWEATING *NAUSEA AND VOMITING THAT IS NOT CONTROLLED WITH YOUR NAUSEA MEDICATION *UNUSUAL SHORTNESS OF BREATH *UNUSUAL BRUISING OR BLEEDING *URINARY PROBLEMS (pain or burning when urinating, or frequent urination) *BOWEL PROBLEMS (unusual diarrhea, constipation, pain near the anus) TENDERNESS IN MOUTH AND THROAT WITH OR WITHOUT PRESENCE OF ULCERS (sore throat, sores in mouth, or a toothache) UNUSUAL RASH, SWELLING OR PAIN  UNUSUAL VAGINAL DISCHARGE OR ITCHING   Items with * indicate a potential emergency and should be followed up as soon as possible or go to the Emergency Department if any problems should occur.  Please show the CHEMOTHERAPY ALERT CARD or IMMUNOTHERAPY ALERT CARD at check-in to the Emergency Department and triage nurse.  Should you have questions after your visit or need to cancel or reschedule your appointment, please contact [facility] [place] - A DEPT OF Eligha Bridegroom [facility] [phone]  and follow the prompts.  Office hours are 8:00 a.m. to 4:30 p.m. Monday - Friday. Please note that voicemails left after 4:00 p.m. may not be returned until the following business day.  We are closed weekends and major holidays. You have access to a nurse at all times for urgent questions. Please call the main number to the clinic [phone] and follow the prompts.  For any non-urgent questions, you may also contact your provider using MyChart. We now offer e-Visits for anyone 43 and older to request care online for non-urgent symptoms. For  details visit [email or web address].   Also download the MyChart app! Go to the app store, search "MyChart", open the app, select [facility], and log in with your MyChart username and password.

## 2024-02-01 NOTE — Progress Notes (Signed)
 Patient to get drug replacement Rituxan due to EOOP for today.  Will re-evaluate with next cycle in 2 months to see if he has met deductible per Domenick Bookbinder.  Pryor Ochoa, PharmD

## 2024-02-01 NOTE — Progress Notes (Signed)
 Patients port flushed without difficulty.  Good blood return noted with no bruising or swelling noted at site.  Patient remains accessed for treatment.

## 2024-02-01 NOTE — Patient Instructions (Addendum)
[facility] Discharge Instructions   You were seen and examined today by Dr. Katragadda.  He reviewed the results of your lab work which are normal/stable.   He reviewed the results of your CT scan which is stable.   We will proceed with your treatment today.   Return as scheduled.    Thank you for choosing [facility] to provide your oncology and hematology care.  To afford each patient quality time with our provider, please arrive at least 15 minutes before your scheduled appointment time.   If you have a lab appointment with the Cancer Center please come in thru the Main Entrance and check in at the main information desk.  You need to re-schedule your appointment should you arrive 10 or more minutes late.  We strive to give you quality time with our providers, and arriving late affects you and other patients whose appointments are after yours.  Also, if you no show three or more times for appointments you may be dismissed from the clinic at the providers discretion.     Again, thank you for choosing [facility].  Our hope is that these requests will decrease the amount of time that you wait before being seen by our physicians.       _____________________________________________________________  Should you have questions after your visit to [facility], please contact our office at [phone] and follow the prompts.  Our office hours are 8:00 a.m. and 4:30 p.m. Monday - Friday.  Please note that voicemails left after 4:00 p.m. may not be returned until the following business day.  We are closed weekends and major holidays.  You do have access to a nurse 24-7, just call the main number to the clinic [phone] and do not press any options, hold on the line and a nurse will answer the phone.    For prescription refill requests, have your pharmacy contact our office and allow 72 hours.     Due to Covid, you will need to wear a mask upon entering the hospital. If you do not have a mask, a mask will be given to you at the Main Entrance upon arrival. For doctor visits, patients may have 1 support person age 18 or older with them. For treatment visits, patients can not have anyone with them due to social distancing guidelines and our immunocompromised population.      

## 2024-02-01 NOTE — Progress Notes (Signed)
 Patient presents today for chemotherapy infusion of Rituximab. Patient is in satisfactory condition with no new complaints voiced.  Vital signs are stable.  Labs reviewed by Dr. Ellin Saba during the office visit and all labs are within treatment parameters.  We will proceed with treatment per MD orders.   Patient tolerated treatment well with no complaints voiced.  Patient left ambulatory in stable condition.  Vital signs stable at discharge.  Follow up as scheduled.

## 2024-02-02 ENCOUNTER — Other Ambulatory Visit: Payer: Self-pay

## 2024-02-08 ENCOUNTER — Other Ambulatory Visit: Payer: Self-pay

## 2024-02-08 ENCOUNTER — Encounter: Payer: Self-pay | Admitting: Hematology

## 2024-02-11 ENCOUNTER — Other Ambulatory Visit: Payer: Self-pay

## 2024-03-29 ENCOUNTER — Inpatient Hospital Stay: Attending: Hematology | Admitting: Hematology

## 2024-03-29 ENCOUNTER — Inpatient Hospital Stay

## 2024-03-29 ENCOUNTER — Encounter: Payer: Self-pay | Admitting: Hematology

## 2024-03-29 VITALS — Wt 226.6 lb

## 2024-03-29 VITALS — BP 110/74 | HR 50 | Temp 97.7°F | Resp 18

## 2024-03-29 DIAGNOSIS — C8318 Mantle cell lymphoma, lymph nodes of multiple sites: Secondary | ICD-10-CM

## 2024-03-29 DIAGNOSIS — Z79620 Long term (current) use of immunosuppressive biologic: Secondary | ICD-10-CM | POA: Diagnosis not present

## 2024-03-29 DIAGNOSIS — Z5112 Encounter for antineoplastic immunotherapy: Secondary | ICD-10-CM | POA: Diagnosis present

## 2024-03-29 DIAGNOSIS — C8310 Mantle cell lymphoma, unspecified site: Secondary | ICD-10-CM | POA: Diagnosis not present

## 2024-03-29 LAB — CBC WITH DIFFERENTIAL/PLATELET
Abs Immature Granulocytes: 0.03 10*3/uL (ref 0.00–0.07)
Basophils Absolute: 0 10*3/uL (ref 0.0–0.1)
Basophils Relative: 0 %
Eosinophils Absolute: 0 10*3/uL (ref 0.0–0.5)
Eosinophils Relative: 1 %
HCT: 38.9 % — ABNORMAL LOW (ref 39.0–52.0)
Hemoglobin: 13.3 g/dL (ref 13.0–17.0)
Immature Granulocytes: 1 %
Lymphocytes Relative: 32 %
Lymphs Abs: 0.9 10*3/uL (ref 0.7–4.0)
MCH: 31.5 pg (ref 26.0–34.0)
MCHC: 34.2 g/dL (ref 30.0–36.0)
MCV: 92.2 fL (ref 80.0–100.0)
Monocytes Absolute: 0.4 10*3/uL (ref 0.1–1.0)
Monocytes Relative: 12 %
Neutro Abs: 1.6 10*3/uL — ABNORMAL LOW (ref 1.7–7.7)
Neutrophils Relative %: 54 %
Platelets: 122 10*3/uL — ABNORMAL LOW (ref 150–400)
RBC: 4.22 MIL/uL (ref 4.22–5.81)
RDW: 12.4 % (ref 11.5–15.5)
WBC: 3 10*3/uL — ABNORMAL LOW (ref 4.0–10.5)
nRBC: 0 % (ref 0.0–0.2)

## 2024-03-29 LAB — COMPREHENSIVE METABOLIC PANEL WITH GFR
ALT: 17 U/L (ref 0–44)
AST: 18 U/L (ref 15–41)
Albumin: 4 g/dL (ref 3.5–5.0)
Alkaline Phosphatase: 99 U/L (ref 38–126)
Anion gap: 7 (ref 5–15)
BUN: 21 mg/dL (ref 8–23)
CO2: 26 mmol/L (ref 22–32)
Calcium: 9 mg/dL (ref 8.9–10.3)
Chloride: 103 mmol/L (ref 98–111)
Creatinine, Ser: 0.72 mg/dL (ref 0.61–1.24)
GFR, Estimated: >60 mL/min (ref >60)
Glucose, Bld: 98 mg/dL (ref 70–99)
Potassium: 4.5 mmol/L (ref 3.5–5.1)
Sodium: 136 mmol/L (ref 135–145)
Total Bilirubin: 0.6 mg/dL (ref 0.0–1.2)
Total Protein: 6.1 g/dL — ABNORMAL LOW (ref 6.5–8.1)

## 2024-03-29 LAB — MAGNESIUM: Magnesium: 2.3 mg/dL (ref 1.7–2.4)

## 2024-03-29 LAB — URIC ACID: Uric Acid, Serum: 5.3 mg/dL (ref 3.7–8.6)

## 2024-03-29 MED ORDER — CETIRIZINE HCL 10 MG/ML IV SOLN
10.0000 mg | Freq: Once | INTRAVENOUS | Status: AC
  Administered 2024-03-29: 10 mg via INTRAVENOUS
  Filled 2024-03-29: qty 1

## 2024-03-29 MED ORDER — SODIUM CHLORIDE 0.9 % IV SOLN: Freq: Once | INTRAVENOUS | Status: AC

## 2024-03-29 MED ORDER — SODIUM CHLORIDE 0.9 % IV SOLN
375.0000 mg/m2 | Freq: Once | INTRAVENOUS | Status: DC
  Filled 2024-03-29: qty 90

## 2024-03-29 MED ORDER — SODIUM CHLORIDE 0.9% FLUSH
10.0000 mL | Freq: Once | INTRAVENOUS | Status: AC
  Administered 2024-03-29: 10 mL via INTRAVENOUS

## 2024-03-29 MED ORDER — ACETAMINOPHEN 325 MG PO TABS
650.0000 mg | ORAL_TABLET | Freq: Once | ORAL | Status: AC
  Administered 2024-03-29: 650 mg via ORAL
  Filled 2024-03-29: qty 2

## 2024-03-29 MED ORDER — RITUXIMAB-PVVR CHEMO 500 MG/50ML IV SOLN
375.0000 mg/m2 | Freq: Once | INTRAVENOUS | Status: AC
  Administered 2024-03-29: 900 mg via INTRAVENOUS
  Filled 2024-03-29: qty 90

## 2024-03-29 MED ORDER — FAMOTIDINE IN NACL 20-0.9 MG/50ML-% IV SOLN
20.0000 mg | Freq: Once | INTRAVENOUS | Status: AC
Start: 2024-03-29 — End: 2024-03-29
  Administered 2024-03-29: 20 mg via INTRAVENOUS
  Filled 2024-03-29: qty 50

## 2024-03-29 MED ORDER — HEPARIN SOD (PORK) LOCK FLUSH 100 UNIT/ML IV SOLN
500.0000 [IU] | Freq: Once | INTRAVENOUS | Status: AC | PRN
  Administered 2024-03-29: 500 [IU]

## 2024-03-29 MED ORDER — SODIUM CHLORIDE 0.9% FLUSH
10.0000 mL | INTRAVENOUS | Status: DC | PRN
  Administered 2024-03-29: 10 mL

## 2024-03-29 NOTE — Progress Notes (Signed)
 [facility] [address], [place] [address]    Clinic Day:  03/29/2024  Referring physician: Paulett Boros, MD  Patient Care Team: Paulett Boros, MD as PCP - General (Hematology) Paulett Boros, MD as Medical Oncologist (Medical Oncology) Gerhard Knuckles, RN as Oncology Nurse Navigator (Medical Oncology)   ASSESSMENT & PLAN:   Assessment: 1.  Chronic lymphocytic leukemia: - Flow cytometry (01/26/2019): Monoclonal B-cell population consistent with CLL. - CT chest (12/12/2022): Large right hilar mass/bulky adenopathy measuring 7.5 x 6.4 cm encasing the right mainstem bronchus and lobar branches.  Additional mediastinal adenopathy, axillary and lower cervical adenopathy. - CT [facility] (12/12/2022): Severe splenomegaly measuring 21.5 cm.  Prominent retroperitoneal lymph nodes, left retroperitoneal nodes measuring 1.6 x 1.0 cm.  Subcapsular hypodense lesion of the peripheral liver dome measuring 1.6 x 1.1 cm.  Additional subcentimeter hypodense lesions too small to characterize. - No B symptoms.  Patient plays golf 3-4 times per week.  No recurrent infections. - CLL FISH panel with 13 q. deletion - T p53 mutation negative - IGHV hyper mutation present. - PET scan (01/08/2023): Marked right hilar adenopathy with lymph node conglomerate/large lymph node measuring 5 cm with SUV of 12.3.  There is hypermetabolic adenopathy above and below diaphragm with splenomegaly. - Bronchoscopy (01/28/2023) and biopsy of the right hilar mass by Dr. Thelda Finney - Pathology: CD5 positive B-cell lymphoproliferative disorder, cyclin D1 positive, favoring mantle cell lymphoma.  FISH for t(11:14) positive indicating mantle cell lymphoma. - 6 cycles of Bendamustine  and rituximab  from 03/02/2023 through 07/21/2023 - Posttreatment PET scan (08/27/2023): No findings of recurrent lymphoma.  Spleen decreased in size measuring 12 x 10 cm. - Maintenance rituximab  started on 09/30/2023   2.   Social/family history: - Lives at home with his girlfriend.  Independent of ADLs and IADLs. - He retired 8 years ago after working at [employer] in [place].  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  Quit smoking in 1992.  Smoked 1 pack/day for 10 years. - Sister had breast cancer.  Another sister had ovarian and stomach cancer.  Father had colon cancer.  Mother had CLL.  Paternal aunt had lung cancer.  Another paternal aunt had breast cancer.    Plan: 1.  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  Matted right hilar lymph nodes measuring 19 x 12 mm similar to PET scan in September 2024 without FDG activity. - Denies fevers, night sweats or weight loss.  He is very active playing golf most days of the week.  Denies any recent infections. - Labs today: Normal LFTs and creatinine.  CBC shows mild leukopenia and thrombocytopenia stable.  ANC is 1.6. - Recommend proceeding with rituximab  infusion today.  I will plan to repeat his imaging after the next infusion in 2 months.    No orders of the defined types were placed in this encounter.     Nadeen Augusta Teague,acting as a Neurosurgeon for Paulett Boros, MD.,have documented all relevant documentation on the behalf of Paulett Boros, MD,as directed by  Paulett Boros, MD while in the presence of Paulett Boros, MD.  I, Paulett Boros MD, have reviewed the above documentation for accuracy and completeness, and I agree with the above.    Paulett Boros, MD   4/29/20252:50 PM  CHIEF COMPLAINT:   Diagnosis: mantle cell lymphoma    Cancer Staging  Mantle cell lymphoma (HCC) Staging form: Hodgkin and Non-Hodgkin  Lymphoma, AJCC 8th Edition - Clinical stage from 02/11/2023: Stage III (Mantle cell lymphoma) - Signed by Paulett Boros, MD on 02/11/2023    Prior Therapy: Bendamustine  and rituximab  every 28 days for 6 cycles, 03/02/23 -  07/21/23   Current Therapy:  maintenance rituximab     HISTORY OF PRESENT ILLNESS:   Oncology History  Mantle cell lymphoma (HCC)  02/11/2023 Initial Diagnosis   Mantle cell lymphoma (HCC)   02/11/2023 Cancer Staging   Staging form: Hodgkin and Non-Hodgkin Lymphoma, AJCC 8th Edition - Clinical stage from 02/11/2023: Stage III (Mantle cell lymphoma) - Signed by Paulett Boros, MD on 02/11/2023 Histopathologic type: Mantle cell lymphoma (Includes all variants: blastic, pleomorphic, small cell) Stage prefix: Initial diagnosis   03/02/2023 - 07/22/2023 Chemotherapy   Patient is on Treatment Plan : NON-HODGKINS LYMPHOMA Rituximab  D1 + Bendamustine  D1,2 q28d x 6 cycles     09/30/2023 -  Chemotherapy   Patient is on Treatment Plan : NON-HODGKINS LYMPHOMA Rituximab  q60d Maintenance        INTERVAL HISTORY:   Travis Wood is a 73 y.o. male presenting to clinic today for follow up of mantle cell lymphoma. He was last seen by me on 02/01/24.  Today, he states that he is doing well overall. His appetite level is at 100%. His energy level is at 100%. He denies any infections, fevers, or night sweats. He has not taken any antibiotics recently. Travis Wood has been able to maintain activity levels and reports working out in his yard the last 5 days.   PAST MEDICAL HISTORY:   Past Medical History: Past Medical History:  Diagnosis Date   Anemia    Cataract    CLL (chronic lymphocytic leukemia) (HCC)    Erectile dysfunction    History of bronchitis    History of colon polyps    Hyperlipemia    Osteoarthritis    Shortness of breath dyspnea    with exertion    Surgical History: Past Surgical History:  Procedure Laterality Date   APPENDECTOMY     BRONCHIAL NEEDLE ASPIRATION BIOPSY  01/28/2023   Procedure: BRONCHIAL NEEDLE ASPIRATION BIOPSIES;  Surgeon: Prudy Brownie, DO;  Location: [facility];  Service: Cardiopulmonary;;   CATARACT EXTRACTION W/PHACO Left 10/15/2015   Procedure: CATARACT  EXTRACTION PHACO AND INTRAOCULAR LENS PLACEMENT LEFT EYE;  Surgeon: Anner Kill, MD;  Location: [facility];  Service: Ophthalmology;  Laterality: Left;  CDE:12.20   CATARACT EXTRACTION W/PHACO Right 11/12/2015   Procedure: CATARACT EXTRACTION PHACO AND INTRAOCULAR LENS PLACEMENT RIGHT EYE:  CDE:  8.99;  Surgeon: Anner Kill, MD;  Location: [facility];  Service: Ophthalmology;  Laterality: Right;   IR IMAGING GUIDED PORT INSERTION  03/04/2023   KNEE SURGERY     Left x 2    MASS EXCISION N/A 09/12/2015   Procedure: EXCISION SOFT TISSUE NEOPLASM (6 CM), BACK;  Surgeon: Alanda Allegra, MD;  Location: [facility];  Service: General;  Laterality: N/A;   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   Procedure: TOTAL KNEE ARTHROPLASTY;  Surgeon: Claiborne Crew, MD;  Location: [facility];  Service: Orthopedics;  Laterality: Right;  70 mins   VIDEO BRONCHOSCOPY WITH ENDOBRONCHIAL ULTRASOUND Bilateral 01/28/2023   Procedure: VIDEO BRONCHOSCOPY WITH ENDOBRONCHIAL ULTRASOUND;  Surgeon: Prudy Brownie, DO;  Location: [facility];  Service: Cardiopulmonary;  Laterality: Bilateral;    Social History: Social History   Socioeconomic History   Marital status: Media planner    Spouse name: Not on file   Number of children: 2   Years  of education: 12 years   Highest education level: 12th grade  Occupational History   Occupation: RETIRED  Tobacco Use   Smoking status: Former    Current packs/day: 0.00    Average packs/day: 1 pack/day for 10.0 years (10.0 ttl pk-yrs)    Types: Cigarettes, Pipe    Start date: 09/09/1981    Quit date: 09/10/1991    Years since quitting: 32.5   Smokeless tobacco: Never   Tobacco comments:    Smoked a pipe occasionally for 3 years.  Vaping Use   Vaping status: Never Used  Substance and Sexual Activity   Alcohol use: Not Currently    Alcohol/week: 1.0 standard drink of alcohol    Types: 1 Cans of beer per week   Drug use: Yes    Frequency: 1.0 times per week    Types: Marijuana   Sexual  activity: Yes  Other Topics Concern   Not on file  Social History Narrative   2 sons who live close by   Lives with girlfriend   Social Drivers of Corporate investment banker Strain: Low Risk  (11/06/2022)   Overall Financial Resource Strain (CARDIA)    Difficulty of Paying Living Expenses: Not hard at all  Food Insecurity: No Food Insecurity (04/20/2023)   Hunger Vital Sign    Worried About Running Out of Food in the Last Year: Never true    Ran Out of Food in the Last Year: Never true  Transportation Needs: No Transportation Needs (04/20/2023)   PRAPARE - Administrator, Civil Service (Medical): No    Lack of Transportation (Non-Medical): No  Physical Activity: Sufficiently Active (11/06/2022)   Exercise Vital Sign    Days of Exercise per Week: 7 days    Minutes of Exercise per Session: 30 min  Stress: No Stress Concern Present (11/06/2022)   [employer] of Occupational Health - Occupational Stress Questionnaire    Feeling of Stress : Not at all  Social Connections: Socially Integrated (11/06/2022)   Social Connection and Isolation Panel [NHANES]    Frequency of Communication with Friends and Family: More than three times a week    Frequency of Social Gatherings with Friends and Family: More than three times a week    Attends Religious Services: 1 to 4 times per year    Active Member of [employer] or Organizations: Yes    Attends Engineer, structural: More than 4 times per year    Marital Status: Living with partner  Intimate Partner Violence: Not At Risk (04/20/2023)   Humiliation, Afraid, Rape, and Kick questionnaire    Fear of Current or Ex-Partner: No    Emotionally Abused: No    Physically Abused: No    Sexually Abused: No    Family History: Family History  Problem Relation Age of Onset   Colon cancer Father        age 53    Colon polyps Father    Heart disease Father        Heart failure   Heart disease Mother    Cancer Sister        Uterine,  and ovarian   Hypertension Brother    Obesity Sister    Heart disease Maternal Grandmother    Esophageal cancer Neg Hx    Liver cancer Neg Hx    Pancreatic cancer Neg Hx    Rectal cancer Neg Hx    Stomach cancer Neg Hx     Current Medications:  Current Outpatient Medications:    antiseptic oral rinse (BIOTENE) LIQD, 15 mLs by Mouth Rinse route as needed for dry mouth., Disp: , Rfl:    atorvastatin  (LIPITOR) 40 MG tablet, Take 1 tablet (40 mg total) by mouth daily., Disp: 90 tablet, Rfl: 3   BENDAMUSTINE  HCL IV, Inject into the vein every 28 (twenty-eight) days. Days 1 and 2 every 28 days, Disp: , Rfl:    ferrous sulfate  325 (65 FE) MG tablet, Take 325 mg by mouth daily with breakfast., Disp: , Rfl:    lidocaine -prilocaine  (EMLA ) cream, Apply to affected area once, Disp: 30 g, Rfl: 3   Multiple Vitamin (MULTIVITAMIN WITH MINERALS) TABS tablet, Take 1 tablet by mouth daily., Disp: , Rfl:    OLIVE LEAF EXTRACT PO, Take 1 tablet by mouth in the morning and at bedtime., Disp: , Rfl:    OVER THE COUNTER MEDICATION, Take 1 each by mouth 5 (five) times daily. [name], Disp: , Rfl:    riTUXimab  (RITUXAN  IV), Inject into the vein every 28 (twenty-eight) days., Disp: , Rfl:    Vitamin D , Ergocalciferol , (DRISDOL ) 1.25 MG (50000 UNIT) CAPS capsule, Take 1 capsule (50,000 Units total) by mouth every 7 (seven) days., Disp: 13 capsule, Rfl: 3 No current facility-administered medications for this visit.  Facility-Administered Medications Ordered in Other Visits:    sodium chloride  flush (NS) 0.9 % injection 10 mL, 10 mL, Intracatheter, PRN, Demian Maisel, MD, 10 mL at 03/29/24 1428   Allergies: No Known Allergies  REVIEW OF SYSTEMS:   Review of Systems  Constitutional:  Negative for chills, fatigue and fever.  HENT:   Negative for lump/mass, mouth sores, nosebleeds, sore throat and trouble swallowing.   Eyes:  Negative for eye problems.  Respiratory:  Negative for cough and shortness  of breath.   Cardiovascular:  Negative for chest pain, leg swelling and palpitations.  Gastrointestinal:  Negative for abdominal pain, constipation, diarrhea, nausea and vomiting.  Genitourinary:  Negative for bladder incontinence, difficulty urinating, dysuria, frequency, hematuria and nocturia.   Musculoskeletal:  Negative for arthralgias, back pain, flank pain, myalgias and neck pain.  Skin:  Negative for itching and rash.  Neurological:  Negative for dizziness, headaches and numbness.  Hematological:  Does not bruise/bleed easily.  Psychiatric/Behavioral:  Negative for depression, sleep disturbance and suicidal ideas. The patient is not nervous/anxious.   All other systems reviewed and are negative.    VITALS:   Weight 226 lb 10.1 oz (102.8 kg).  Wt Readings from Last 3 Encounters:  03/29/24 226 lb 10.1 oz (102.8 kg)  02/01/24 228 lb 13.4 oz (103.8 kg)  11/30/23 224 lb 6.4 oz (101.8 kg)    Body mass index is 28.33 kg/m.  Performance status (ECOG): 1 - Symptomatic but completely ambulatory  PHYSICAL EXAM:   Physical Exam Vitals and nursing note reviewed. Exam conducted with a chaperone present.  Constitutional:      Appearance: Normal appearance.  Cardiovascular:     Rate and Rhythm: Normal rate and regular rhythm.     Pulses: Normal pulses.     Heart sounds: Normal heart sounds.  Pulmonary:     Effort: Pulmonary effort is normal.     Breath sounds: Normal breath sounds.  Abdominal:     Palpations: Abdomen is soft. There is no hepatomegaly, splenomegaly or mass.     Tenderness: There is no abdominal tenderness.  Musculoskeletal:     Right lower leg: No edema.     Left lower leg: No edema.  Lymphadenopathy:     Cervical: No cervical adenopathy.     Right cervical: No superficial, deep or posterior cervical adenopathy.    Left cervical: No superficial, deep or posterior cervical adenopathy.     Upper Body:     Right upper body: No supraclavicular or axillary  adenopathy.     Left upper body: No supraclavicular or axillary adenopathy.  Neurological:     General: No focal deficit present.     Mental Status: He is alert and oriented to person, place, and time.  Psychiatric:        Mood and Affect: Mood normal.        Behavior: Behavior normal.     LABS:   CBC     Component Value Date/Time   WBC 3.0 (L) 03/29/2024 0950   RBC 4.22 03/29/2024 0950   HGB 13.3 03/29/2024 0950   HGB CANCELED 11/12/2022 1034   HCT 38.9 (L) 03/29/2024 0950   HCT CANCELED 11/12/2022 1034   PLT 122 (L) 03/29/2024 0950   PLT CANCELED 11/12/2022 1034   MCV 92.2 03/29/2024 0950   MCV CANCELED 11/12/2022 1034   MCH 31.5 03/29/2024 0950   MCHC 34.2 03/29/2024 0950   RDW 12.4 03/29/2024 0950   RDW CANCELED 11/12/2022 1034   LYMPHSABS 0.9 03/29/2024 0950   LYMPHSABS CANCELED 11/12/2022 1034   MONOABS 0.4 03/29/2024 0950   EOSABS 0.0 03/29/2024 0950   EOSABS CANCELED 11/12/2022 1034   BASOSABS 0.0 03/29/2024 0950   BASOSABS CANCELED 11/12/2022 1034    CMP      Component Value Date/Time   NA 136 03/29/2024 0950   NA 143 11/12/2022 1034   K 4.5 03/29/2024 0950   CL 103 03/29/2024 0950   CO2 26 03/29/2024 0950   GLUCOSE 98 03/29/2024 0950   BUN 21 03/29/2024 0950   BUN 16 11/12/2022 1034   CREATININE 0.72 03/29/2024 0950   CALCIUM  9.0 03/29/2024 0950   PROT 6.1 (L) 03/29/2024 0950   PROT 6.0 11/12/2022 1034   ALBUMIN 4.0 03/29/2024 0950   ALBUMIN 4.5 11/12/2022 1034   AST 18 03/29/2024 0950   ALT 17 03/29/2024 0950   ALKPHOS 99 03/29/2024 0950   BILITOT 0.6 03/29/2024 0950   BILITOT 0.5 11/12/2022 1034   GFRNONAA >60 03/29/2024 0950   GFRAA 96 05/30/2020 0932     No results found for: "CEA1", "CEA" / No results found for: "CEA1", "CEA" Lab Results  Component Value Date   PSA1 0.6 06/06/2020   No results found for: "NFA213" No results found for: "CAN125"  Lab Results  Component Value Date   TOTALPROTELP 6.0 01/26/2019   ALBUMINELP 3.9  01/26/2019   A1GS 0.2 01/26/2019   A2GS 0.5 01/26/2019   BETS 0.9 01/26/2019   GAMS 0.5 01/26/2019   MSPIKE Not Observed 01/26/2019   SPEI Comment 01/26/2019   Lab Results  Component Value Date   TIBC 310 08/21/2021   FERRITIN 421 (H) 08/21/2021   IRONPCTSAT 20 08/21/2021   Lab Results  Component Value Date   LDH 129 08/27/2023   LDH 128 07/21/2023   LDH 120 06/23/2023     STUDIES:   No results found.

## 2024-03-29 NOTE — Patient Instructions (Signed)
 [facility] [place] - A DEPT OF [facility]  Discharge Instructions: Thank you for choosing [facility] to provide your oncology and hematology care.  If you have a lab appointment with the Cancer Center - please note that after April 8th, 2024, all labs will be drawn in the cancer center.  You do not have to check in or register with the main entrance as you have in the past but will complete your check-in in the cancer center.  Wear comfortable clothing and clothing appropriate for easy access to any Portacath or PICC line.   We strive to give you quality time with your provider. You may need to reschedule your appointment if you arrive late (15 or more minutes).  Arriving late affects you and other patients whose appointments are after yours.  Also, if you miss three or more appointments without notifying the office, you may be dismissed from the clinic at the provider's discretion.      For prescription refill requests, have your pharmacy contact our office and allow 72 hours for refills to be completed.    Today you received the following chemotherapy and/or immunotherapy agents Rituxumab, return as scheduled.   To help prevent nausea and vomiting after your treatment, we encourage you to take your nausea medication as directed.  BELOW ARE SYMPTOMS THAT SHOULD BE REPORTED IMMEDIATELY: *FEVER GREATER THAN 100.4 F (38 C) OR HIGHER *CHILLS OR SWEATING *NAUSEA AND VOMITING THAT IS NOT CONTROLLED WITH YOUR NAUSEA MEDICATION *UNUSUAL SHORTNESS OF BREATH *UNUSUAL BRUISING OR BLEEDING *URINARY PROBLEMS (pain or burning when urinating, or frequent urination) *BOWEL PROBLEMS (unusual diarrhea, constipation, pain near the anus) TENDERNESS IN MOUTH AND THROAT WITH OR WITHOUT PRESENCE OF ULCERS (sore throat, sores in mouth, or a toothache) UNUSUAL RASH, SWELLING OR PAIN  UNUSUAL VAGINAL DISCHARGE OR ITCHING   Items with * indicate a potential emergency and  should be followed up as soon as possible or go to the Emergency Department if any problems should occur.  Please show the CHEMOTHERAPY ALERT CARD or IMMUNOTHERAPY ALERT CARD at check-in to the Emergency Department and triage nurse.  Should you have questions after your visit or need to cancel or reschedule your appointment, please contact [facility] [place] - A DEPT OF [facility] [phone]  and follow the prompts.  Office hours are 8:00 a.m. to 4:30 p.m. Monday - Friday. Please note that voicemails left after 4:00 p.m. may not be returned until the following business day.  We are closed weekends and major holidays. You have access to a nurse at all times for urgent questions. Please call the main number to the clinic [phone] and follow the prompts.  For any non-urgent questions, you may also contact your provider using MyChart. We now offer e-Visits for anyone 62 and older to request care online for non-urgent symptoms. For details visit [email or web address].   Also download the MyChart app! Go to the app store, search "MyChart", open the app, select [facility], and log in with your MyChart username and password.

## 2024-03-29 NOTE — Progress Notes (Signed)
 Patient presents today for Rituximab  infusion ANC 1.6, okay for treatment today per Dr. Cheree Cords. Patient tolerated therapy with no complaints voiced. Side effects with management reviewed with understanding verbalized. Port site clean and dry with no bruising or swelling noted at site. Good blood return noted before and after administration of therapy. Band aid applied. Patient left in satisfactory condition with VSS and no s/s of distress noted.

## 2024-03-29 NOTE — Patient Instructions (Signed)

## 2024-03-30 ENCOUNTER — Other Ambulatory Visit: Payer: Self-pay

## 2024-03-31 ENCOUNTER — Other Ambulatory Visit: Payer: Self-pay

## 2024-03-31 NOTE — Progress Notes (Signed)
 Rituxan  will be given for rituximab  due to EOOP and approved for assistance.  Once meets deductible will return to [facility] .  Augie Bliss, PharmD

## 2024-04-06 ENCOUNTER — Other Ambulatory Visit: Payer: Self-pay | Admitting: Family Medicine

## 2024-04-06 DIAGNOSIS — E782 Mixed hyperlipidemia: Secondary | ICD-10-CM

## 2024-04-07 NOTE — Telephone Encounter (Signed)
 Stacks NTBS last OV 12/17/22 NO RF sent to pharmacy last OV greater than a year

## 2024-04-07 NOTE — Telephone Encounter (Signed)
 Apt scheduled 04/11/2024

## 2024-04-11 ENCOUNTER — Encounter: Payer: Self-pay | Admitting: Family Medicine

## 2024-04-11 ENCOUNTER — Ambulatory Visit (INDEPENDENT_AMBULATORY_CARE_PROVIDER_SITE_OTHER): Admitting: Family Medicine

## 2024-04-11 VITALS — BP 121/74 | HR 85 | Temp 97.9°F | Ht 75.0 in | Wt 225.0 lb

## 2024-04-11 DIAGNOSIS — C8318 Mantle cell lymphoma, lymph nodes of multiple sites: Secondary | ICD-10-CM

## 2024-04-11 DIAGNOSIS — N401 Enlarged prostate with lower urinary tract symptoms: Secondary | ICD-10-CM

## 2024-04-11 DIAGNOSIS — E782 Mixed hyperlipidemia: Secondary | ICD-10-CM

## 2024-04-11 DIAGNOSIS — D122 Benign neoplasm of ascending colon: Secondary | ICD-10-CM

## 2024-04-11 DIAGNOSIS — E559 Vitamin D deficiency, unspecified: Secondary | ICD-10-CM

## 2024-04-11 DIAGNOSIS — R350 Frequency of micturition: Secondary | ICD-10-CM

## 2024-04-11 DIAGNOSIS — K5730 Diverticulosis of large intestine without perforation or abscess without bleeding: Secondary | ICD-10-CM

## 2024-04-11 MED ORDER — VITAMIN D (ERGOCALCIFEROL) 1.25 MG (50000 UNIT) PO CAPS: 50000.0000 [IU] | ORAL_CAPSULE | ORAL | 3 refills | Status: DC

## 2024-04-11 MED ORDER — ATORVASTATIN CALCIUM 40 MG PO TABS: 40.0000 mg | ORAL_TABLET | Freq: Every day | ORAL | 3 refills | Status: DC

## 2024-04-11 NOTE — Progress Notes (Signed)
 Subjective:  Patient ID: Travis Wood, male    DOB: [birth year]  Age: 73 y.o. MRN: [id number]  CC: Medication Refill (pended)   HPI DINH SAWICKI presents for  in for follow-up of elevated cholesterol. Doing well without complaints on current medication. Denies side effects of statin including myalgia and arthralgia and nausea. Currently no chest pain, shortness of breath or other cardiovascular related symptoms noted. Patient has been undergoing chemotherapy for mantle cell lymphoma during this last year and has not been in for his routine checkups.  He continues to take medication for cholesterol via atorvastatin .  He has also had a vitamin D  deficiency for which he takes a weekly medication/supplement he is doing well otherwise he says he is playing golf 3 times a week now.  The leukemia is remote now for him.  He says that there is really nothing going on with that.     12/29/2022    1:47 PM 12/17/2022   10:14 AM 11/12/2022    9:39 AM  Depression screen PHQ 2/9  Decreased Interest 0 0 0  Down, Depressed, Hopeless 0 0 0  PHQ - 2 Score 0 0 0    History Nelson has a past medical history of Anemia, Cataract, CLL (chronic lymphocytic leukemia) (HCC), Erectile dysfunction, History of bronchitis, History of colon polyps, Hyperlipemia, Osteoarthritis, and Shortness of breath dyspnea.   He has a past surgical history that includes Appendectomy; Knee surgery; Mass excision (N/A, 09/12/2015); Cataract extraction w/PHACO (Left, 10/15/2015); Cataract extraction w/PHACO (Right, 11/12/2015); Total knee arthroplasty (Right, 10/09/2020); Video bronchoscopy with endobronchial ultrasound (Bilateral, 01/28/2023); Bronchial needle aspiration biopsy (01/28/2023); and IR IMAGING GUIDED PORT INSERTION (03/04/2023).   His family history includes Cancer in his sister; Colon cancer in his father; Colon polyps in his father; Heart disease in his father, maternal grandmother, and mother; Hypertension in his brother;  Obesity in his sister.He reports that he quit smoking about 32 years ago. His smoking use included cigarettes and pipe. He started smoking about 42 years ago. He has a 10 pack-year smoking history. He has never used smokeless tobacco. He reports that he does not currently use alcohol after a past usage of about 1.0 standard drink of alcohol per week. He reports current drug use. Frequency: 1.00 time per week. Drug: Marijuana.    ROS Review of Systems  Constitutional:  Negative for fever.  Respiratory:  Negative for shortness of breath.   Cardiovascular:  Negative for chest pain.  Musculoskeletal:  Negative for arthralgias.  Skin:  Negative for rash.    Objective:  BP 121/74   Pulse 85   Temp 97.9 F (36.6 C)   Ht 6\' 3"  (1.905 m)   Wt 225 lb (102.1 kg)   SpO2 98%   BMI 28.12 kg/m   BP Readings from Last 3 Encounters:  04/11/24 121/74  03/29/24 110/74  03/29/24 136/74    Wt Readings from Last 3 Encounters:  04/11/24 225 lb (102.1 kg)  03/29/24 226 lb 10.1 oz (102.8 kg)  02/01/24 228 lb 13.4 oz (103.8 kg)     Physical Exam Vitals reviewed.  Constitutional:      Appearance: He is well-developed.  HENT:     Head: Normocephalic and atraumatic.     Right Ear: External ear normal.     Left Ear: External ear normal.     Mouth/Throat:     Pharynx: No oropharyngeal exudate or posterior oropharyngeal erythema.  Eyes:     Pupils: Pupils are equal, round,  and reactive to light.  Cardiovascular:     Rate and Rhythm: Normal rate and regular rhythm.     Heart sounds: No murmur heard. Pulmonary:     Effort: No respiratory distress.     Breath sounds: Normal breath sounds.  Musculoskeletal:     Cervical back: Normal range of motion and neck supple.  Neurological:     Mental Status: He is alert and oriented to person, place, and time.      Assessment & Plan:  Mixed hyperlipidemia -     Atorvastatin  Calcium ; Take 1 tablet (40 mg total) by mouth daily.  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  Dispense: 13 capsule; Refill: 3 -     VITAMIN D  25 Hydroxy (Vit-D Deficiency, Fractures)  Diverticulosis, sigmoid  Adenomatous polyp of ascending colon -     Ambulatory referral to Gastroenterology  Pt. To get chemo via port every 2 mos for 2 years for Mantle cell lymphoma, even though it is in remission.   Follow-up: Return in about 6 months (around 10/12/2024) for cholesterol.  Roise Cleaver, M.D.

## 2024-04-12 ENCOUNTER — Ambulatory Visit: Payer: Self-pay | Admitting: Family Medicine

## 2024-04-12 ENCOUNTER — Encounter: Payer: Self-pay | Admitting: Hematology

## 2024-04-12 LAB — PSA, TOTAL AND FREE
PSA, Free Pct: 27.5 %
PSA, Free: 0.11 ng/mL
Prostate Specific Ag, Serum: 0.4 ng/mL (ref 0.0–4.0)

## 2024-04-12 LAB — LIPID PANEL
Chol/HDL Ratio: 5.6 ratio — ABNORMAL HIGH (ref 0.0–5.0)
Cholesterol, Total: 220 mg/dL — ABNORMAL HIGH (ref 100–199)
HDL: 39 mg/dL — ABNORMAL LOW (ref >39)
LDL Chol Calc (NIH): 154 mg/dL — ABNORMAL HIGH (ref 0–99)
Triglycerides: 149 mg/dL (ref 0–149)
VLDL Cholesterol Cal: 27 mg/dL (ref 5–40)

## 2024-04-12 LAB — CMP14+EGFR
ALT: 16 IU/L (ref 0–44)
AST: 17 IU/L (ref 0–40)
Albumin: 4.6 g/dL (ref 3.8–4.8)
Alkaline Phosphatase: 126 IU/L — ABNORMAL HIGH (ref 44–121)
BUN/Creatinine Ratio: 17 (ref 10–24)
BUN: 15 mg/dL (ref 8–27)
Bilirubin Total: 0.3 mg/dL (ref 0.0–1.2)
CO2: 27 mmol/L (ref 20–29)
Calcium: 9.5 mg/dL (ref 8.6–10.2)
Chloride: 103 mmol/L (ref 96–106)
Creatinine, Ser: 0.86 mg/dL (ref 0.76–1.27)
Globulin, Total: 1.4 g/dL — ABNORMAL LOW (ref 1.5–4.5)
Glucose: 93 mg/dL (ref 70–99)
Potassium: 5.1 mmol/L (ref 3.5–5.2)
Sodium: 142 mmol/L (ref 134–144)
Total Protein: 6 g/dL (ref 6.0–8.5)
eGFR: 92 mL/min/{1.73_m2} (ref >59)

## 2024-04-12 LAB — VITAMIN D 25 HYDROXY (VIT D DEFICIENCY, FRACTURES): Vit D, 25-Hydroxy: 26 ng/mL — ABNORMAL LOW (ref 30.0–100.0)

## 2024-04-13 ENCOUNTER — Other Ambulatory Visit: Payer: Self-pay

## 2024-05-09 ENCOUNTER — Encounter: Payer: Self-pay | Admitting: Internal Medicine

## 2024-05-25 ENCOUNTER — Encounter: Payer: Self-pay | Admitting: *Deleted

## 2024-05-25 ENCOUNTER — Other Ambulatory Visit: Payer: Self-pay | Admitting: *Deleted

## 2024-05-25 ENCOUNTER — Encounter: Payer: Self-pay | Admitting: Internal Medicine

## 2024-05-25 ENCOUNTER — Ambulatory Visit (INDEPENDENT_AMBULATORY_CARE_PROVIDER_SITE_OTHER): Admitting: Internal Medicine

## 2024-05-25 VITALS — BP 112/73 | HR 61 | Temp 98.1°F | Ht 75.0 in | Wt 224.4 lb

## 2024-05-25 DIAGNOSIS — Z800 Family history of malignant neoplasm of digestive organs: Secondary | ICD-10-CM | POA: Diagnosis not present

## 2024-05-25 DIAGNOSIS — Z860101 Personal history of adenomatous and serrated colon polyps: Secondary | ICD-10-CM

## 2024-05-25 DIAGNOSIS — K5904 Chronic idiopathic constipation: Secondary | ICD-10-CM

## 2024-05-25 DIAGNOSIS — K5900 Constipation, unspecified: Secondary | ICD-10-CM

## 2024-05-25 DIAGNOSIS — D122 Benign neoplasm of ascending colon: Secondary | ICD-10-CM

## 2024-05-25 MED ORDER — PEG 3350-KCL-NA BICARB-NACL 420 G PO SOLR: 4000.0000 mL | Freq: Once | ORAL | 0 refills | Status: AC

## 2024-05-25 NOTE — Patient Instructions (Signed)
 We will schedule her for colonoscopy for surveillance purposes.  For your constipation, I would recommend you to start taking over the counter MiraLAX  1 capful daily.  If this does not adequately control your constipation, I would increase to 2 capfuls daily.  If this is still not adequate, then I would add on once daily Dulcolax (bisacodyl ) tablet.  Be sure to drink at least 6 glasses of water  daily.   It was very nice meeting you today.  Dr. Cindie

## 2024-05-25 NOTE — H&P (View-Only) (Signed)
 Primary Care Physician:  Zollie Lowers, MD Primary Gastroenterologist:  Dr. Cindie  Chief Complaint  Patient presents with   Colonoscopy    Pt arrives to discuss colonoscopy. Pt father had colon cancer. Pt has history of lung cancer. Last colonoscopy 4-5 years ago. If pt eats late at night, he can tell the next morning.     HPI:   Travis Wood is a 73 y.o. male who presents to clinic today by referral from his PCP Dr. Zollie for evaluation.  Last colonoscopy 02/22/2018 with 5 small polyps removed from transverse and ascending colon's.  Pathology with majority tubular adenomas, 1 benign polyp.  Recommended 3-year recall.  Patient reports family history of colon cancer in his father age 61.  Denies any melena hematochezia.  No abdominal pain.  No unintentional weight loss.  Denies any upper GI symptoms including heartburn, reflux, dysphagia/odynophagia, epigastric or chest pain.  Does note some occasional constipation, mild, occasional straining.  Past Medical History:  Diagnosis Date   Anemia    Cataract    CLL (chronic lymphocytic leukemia) (HCC)    Erectile dysfunction    History of bronchitis    History of colon polyps    Hyperlipemia    Osteoarthritis    Shortness of breath dyspnea    with exertion    Past Surgical History:  Procedure Laterality Date   APPENDECTOMY     BRONCHIAL NEEDLE ASPIRATION BIOPSY  01/28/2023   Procedure: BRONCHIAL NEEDLE ASPIRATION BIOPSIES;  Surgeon: Brenna Adine LITTIE, DO;  Location: [facility];  Service: Cardiopulmonary;;   CATARACT EXTRACTION W/PHACO Left 10/15/2015   Procedure: CATARACT EXTRACTION PHACO AND INTRAOCULAR LENS PLACEMENT LEFT EYE;  Surgeon: Cherene Mania, MD;  Location: [facility];  Service: Ophthalmology;  Laterality: Left;  CDE:12.20   CATARACT EXTRACTION W/PHACO Right 11/12/2015   Procedure: CATARACT EXTRACTION PHACO AND INTRAOCULAR LENS PLACEMENT RIGHT EYE:  CDE:  8.99;  Surgeon: Cherene Mania, MD;  Location: [facility];  Service:  Ophthalmology;  Laterality: Right;   IR IMAGING GUIDED PORT INSERTION  03/04/2023   KNEE SURGERY     Left x 2    MASS EXCISION N/A 09/12/2015   Procedure: EXCISION SOFT TISSUE NEOPLASM (6 CM), BACK;  Surgeon: Oneil Budge, MD;  Location: [facility];  Service: General;  Laterality: N/A;   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   Procedure: TOTAL KNEE ARTHROPLASTY;  Surgeon: Ernie Cough, MD;  Location: [facility];  Service: Orthopedics;  Laterality: Right;  70 mins   VIDEO BRONCHOSCOPY WITH ENDOBRONCHIAL ULTRASOUND Bilateral 01/28/2023   Procedure: VIDEO BRONCHOSCOPY WITH ENDOBRONCHIAL ULTRASOUND;  Surgeon: Brenna Adine LITTIE, DO;  Location: [facility];  Service: Cardiopulmonary;  Laterality: Bilateral;    Current Outpatient Medications  Medication Sig Dispense Refill   antiseptic oral rinse (BIOTENE) LIQD 15 mLs by Mouth Rinse route as needed for dry mouth.     atorvastatin  (LIPITOR) 40 MG tablet Take 1 tablet (40 mg total) by mouth daily. 90 tablet 3   BENDAMUSTINE  HCL IV Inject into the vein every 28 (twenty-eight) days. Days 1 and 2 every 28 days     Multiple Vitamin (MULTIVITAMIN WITH MINERALS) TABS tablet Take 1 tablet by mouth daily.     OLIVE LEAF EXTRACT PO Take 1 tablet by mouth in the morning and at bedtime.     OVER THE COUNTER MEDICATION Take 1 each by mouth 5 (five) times daily. [name]     riTUXimab  (RITUXAN  IV) Inject into the vein every 28 (twenty-eight) days.  Vitamin D , Ergocalciferol , (DRISDOL ) 1.25 MG (50000 UNIT) CAPS capsule Take 1 capsule (50,000 Units total) by mouth every 7 (seven) days. 13 capsule 3   ferrous sulfate  325 (65 FE) MG tablet Take 325 mg by mouth daily with breakfast. (Patient not taking: Reported on 05/25/2024)     No current facility-administered medications for this visit.    Allergies as of 05/25/2024   (No Known Allergies)    Family History  Problem Relation Age of Onset   Colon cancer Father        age 31    Colon polyps Father    Heart  disease Father        Heart failure   Heart disease Mother    Cancer Sister        Uterine, and ovarian   Hypertension Brother    Obesity Sister    Heart disease Maternal Grandmother    Esophageal cancer Neg Hx    Liver cancer Neg Hx    Pancreatic cancer Neg Hx    Rectal cancer Neg Hx    Stomach cancer Neg Hx     Social History   Socioeconomic History   Marital status: Media planner    Spouse name: Not on file   Number of children: 2   Years of education: 12 years   Highest education level: 12th grade  Occupational History   Occupation: RETIRED  Tobacco Use   Smoking status: Former    Current packs/day: 0.00    Average packs/day: 1 pack/day for 10.0 years (10.0 ttl pk-yrs)    Types: Cigarettes, Pipe    Start date: 09/09/1981    Quit date: 09/10/1991    Years since quitting: 32.7   Smokeless tobacco: Never   Tobacco comments:    Smoked a pipe occasionally for 3 years.  Vaping Use   Vaping status: Never Used  Substance and Sexual Activity   Alcohol use: Not Currently    Alcohol/week: 1.0 standard drink of alcohol    Types: 1 Cans of beer per week   Drug use: Yes    Frequency: 1.0 times per week    Types: Marijuana   Sexual activity: Yes  Other Topics Concern   Not on file  Social History Narrative   2 sons who live close by   Lives with girlfriend   Social Drivers of Corporate investment banker Strain: Low Risk  (11/06/2022)   Overall Financial Resource Strain (CARDIA)    Difficulty of Paying Living Expenses: Not hard at all  Food Insecurity: No Food Insecurity (04/20/2023)   Hunger Vital Sign    Worried About Running Out of Food in the Last Year: Never true    Ran Out of Food in the Last Year: Never true  Transportation Needs: No Transportation Needs (04/20/2023)   PRAPARE - Administrator, Civil Service (Medical): No    Lack of Transportation (Non-Medical): No  Physical Activity: Sufficiently Active (11/06/2022)   Exercise Vital Sign     Days of Exercise per Week: 7 days    Minutes of Exercise per Session: 30 min  Stress: No Stress Concern Present (11/06/2022)   [employer] of Occupational Health - Occupational Stress Questionnaire    Feeling of Stress : Not at all  Social Connections: Socially Integrated (11/06/2022)   Social Connection and Isolation Panel    Frequency of Communication with Friends and Family: More than three times a week    Frequency of Social Gatherings with Friends and  Family: More than three times a week    Attends Religious Services: 1 to 4 times per year    Active Member of Clubs or Organizations: Yes    Attends Banker Meetings: More than 4 times per year    Marital Status: Living with partner  Intimate Partner Violence: Not At Risk (04/20/2023)   Humiliation, Afraid, Rape, and Kick questionnaire    Fear of Current or Ex-Partner: No    Emotionally Abused: No    Physically Abused: No    Sexually Abused: No    Subjective: Review of Systems  Constitutional:  Negative for chills and fever.  HENT:  Negative for congestion and hearing loss.   Eyes:  Negative for blurred vision and double vision.  Respiratory:  Negative for cough and shortness of breath.   Cardiovascular:  Negative for chest pain and palpitations.  Gastrointestinal:  Negative for abdominal pain, blood in stool, constipation, diarrhea, heartburn, melena and vomiting.  Genitourinary:  Negative for dysuria and urgency.  Musculoskeletal:  Negative for joint pain and myalgias.  Skin:  Negative for itching and rash.  Neurological:  Negative for dizziness and headaches.  Psychiatric/Behavioral:  Negative for depression. The patient is not nervous/anxious.        Objective: BP 112/73   Pulse 61   Temp 98.1 F (36.7 C)   Ht 6' 3 (1.905 m)   Wt 224 lb 6.4 oz (101.8 kg)   BMI 28.05 kg/m  Physical Exam Constitutional:      Appearance: Normal appearance.  HENT:     Head: Normocephalic and atraumatic.   Eyes:      Extraocular Movements: Extraocular movements intact.     Conjunctiva/sclera: Conjunctivae normal.    Cardiovascular:     Rate and Rhythm: Normal rate and regular rhythm.  Pulmonary:     Effort: Pulmonary effort is normal.     Breath sounds: Normal breath sounds.  Abdominal:     General: Bowel sounds are normal.     Palpations: Abdomen is soft.   Musculoskeletal:        General: Normal range of motion.     Cervical back: Normal range of motion and neck supple.   Skin:    General: Skin is warm.   Neurological:     General: No focal deficit present.     Mental Status: He is alert and oriented to person, place, and time.   Psychiatric:        Mood and Affect: Mood normal.        Behavior: Behavior normal.      Assessment: *Personal history of adenomatous colon polyps *Family history of colon cancer in father *Constipation-mild  Plan: Will schedule for surveillance colonoscopy.The risks including infection, bleed, or perforation as well as benefits, limitations, alternatives and imponderables have been reviewed with the patient. Questions have been answered. All parties agreeable.  For patient's constipation, I recommended taking MiraLAX  1 capful daily.  If this is not adequate then increase to 2 capfuls daily.  If this is still not adequate then add on once daily Dulcolax.  Encouraged to drink at least 6 glasses of water  daily.  Thank you Dr Zollie for the kind referral.  05/25/2024 2:04 PM   Disclaimer: This note was dictated with voice recognition software. Similar sounding words can inadvertently be transcribed and may not be corrected upon review.

## 2024-05-25 NOTE — Progress Notes (Signed)
 Primary Care Physician:  Zollie Lowers, MD Primary Gastroenterologist:  Dr. Cindie  Chief Complaint  Patient presents with   Colonoscopy    Pt arrives to discuss colonoscopy. Pt father had colon cancer. Pt has history of lung cancer. Last colonoscopy 4-5 years ago. If pt eats late at night, he can tell the next morning.     HPI:   Travis Wood is a 73 y.o. male who presents to clinic today by referral from his PCP Dr. Zollie for evaluation.  Last colonoscopy 02/22/2018 with 5 small polyps removed from transverse and ascending colon's.  Pathology with majority tubular adenomas, 1 benign polyp.  Recommended 3-year recall.  Patient reports family history of colon cancer in his father age 61.  Denies any melena hematochezia.  No abdominal pain.  No unintentional weight loss.  Denies any upper GI symptoms including heartburn, reflux, dysphagia/odynophagia, epigastric or chest pain.  Does note some occasional constipation, mild, occasional straining.  Past Medical History:  Diagnosis Date   Anemia    Cataract    CLL (chronic lymphocytic leukemia) (HCC)    Erectile dysfunction    History of bronchitis    History of colon polyps    Hyperlipemia    Osteoarthritis    Shortness of breath dyspnea    with exertion    Past Surgical History:  Procedure Laterality Date   APPENDECTOMY     BRONCHIAL NEEDLE ASPIRATION BIOPSY  01/28/2023   Procedure: BRONCHIAL NEEDLE ASPIRATION BIOPSIES;  Surgeon: Brenna Adine LITTIE, DO;  Location: [facility];  Service: Cardiopulmonary;;   CATARACT EXTRACTION W/PHACO Left 10/15/2015   Procedure: CATARACT EXTRACTION PHACO AND INTRAOCULAR LENS PLACEMENT LEFT EYE;  Surgeon: Cherene Mania, MD;  Location: [facility];  Service: Ophthalmology;  Laterality: Left;  CDE:12.20   CATARACT EXTRACTION W/PHACO Right 11/12/2015   Procedure: CATARACT EXTRACTION PHACO AND INTRAOCULAR LENS PLACEMENT RIGHT EYE:  CDE:  8.99;  Surgeon: Cherene Mania, MD;  Location: [facility];  Service:  Ophthalmology;  Laterality: Right;   IR IMAGING GUIDED PORT INSERTION  03/04/2023   KNEE SURGERY     Left x 2    MASS EXCISION N/A 09/12/2015   Procedure: EXCISION SOFT TISSUE NEOPLASM (6 CM), BACK;  Surgeon: Oneil Budge, MD;  Location: [facility];  Service: General;  Laterality: N/A;   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   Procedure: TOTAL KNEE ARTHROPLASTY;  Surgeon: Ernie Cough, MD;  Location: [facility];  Service: Orthopedics;  Laterality: Right;  70 mins   VIDEO BRONCHOSCOPY WITH ENDOBRONCHIAL ULTRASOUND Bilateral 01/28/2023   Procedure: VIDEO BRONCHOSCOPY WITH ENDOBRONCHIAL ULTRASOUND;  Surgeon: Brenna Adine LITTIE, DO;  Location: [facility];  Service: Cardiopulmonary;  Laterality: Bilateral;    Current Outpatient Medications  Medication Sig Dispense Refill   antiseptic oral rinse (BIOTENE) LIQD 15 mLs by Mouth Rinse route as needed for dry mouth.     atorvastatin  (LIPITOR) 40 MG tablet Take 1 tablet (40 mg total) by mouth daily. 90 tablet 3   BENDAMUSTINE  HCL IV Inject into the vein every 28 (twenty-eight) days. Days 1 and 2 every 28 days     Multiple Vitamin (MULTIVITAMIN WITH MINERALS) TABS tablet Take 1 tablet by mouth daily.     OLIVE LEAF EXTRACT PO Take 1 tablet by mouth in the morning and at bedtime.     OVER THE COUNTER MEDICATION Take 1 each by mouth 5 (five) times daily. [name]     riTUXimab  (RITUXAN  IV) Inject into the vein every 28 (twenty-eight) days.  Vitamin D , Ergocalciferol , (DRISDOL ) 1.25 MG (50000 UNIT) CAPS capsule Take 1 capsule (50,000 Units total) by mouth every 7 (seven) days. 13 capsule 3   ferrous sulfate  325 (65 FE) MG tablet Take 325 mg by mouth daily with breakfast. (Patient not taking: Reported on 05/25/2024)     No current facility-administered medications for this visit.    Allergies as of 05/25/2024   (No Known Allergies)    Family History  Problem Relation Age of Onset   Colon cancer Father        age 31    Colon polyps Father    Heart  disease Father        Heart failure   Heart disease Mother    Cancer Sister        Uterine, and ovarian   Hypertension Brother    Obesity Sister    Heart disease Maternal Grandmother    Esophageal cancer Neg Hx    Liver cancer Neg Hx    Pancreatic cancer Neg Hx    Rectal cancer Neg Hx    Stomach cancer Neg Hx     Social History   Socioeconomic History   Marital status: Media planner    Spouse name: Not on file   Number of children: 2   Years of education: 12 years   Highest education level: 12th grade  Occupational History   Occupation: RETIRED  Tobacco Use   Smoking status: Former    Current packs/day: 0.00    Average packs/day: 1 pack/day for 10.0 years (10.0 ttl pk-yrs)    Types: Cigarettes, Pipe    Start date: 09/09/1981    Quit date: 09/10/1991    Years since quitting: 32.7   Smokeless tobacco: Never   Tobacco comments:    Smoked a pipe occasionally for 3 years.  Vaping Use   Vaping status: Never Used  Substance and Sexual Activity   Alcohol use: Not Currently    Alcohol/week: 1.0 standard drink of alcohol    Types: 1 Cans of beer per week   Drug use: Yes    Frequency: 1.0 times per week    Types: Marijuana   Sexual activity: Yes  Other Topics Concern   Not on file  Social History Narrative   2 sons who live close by   Lives with girlfriend   Social Drivers of Corporate investment banker Strain: Low Risk  (11/06/2022)   Overall Financial Resource Strain (CARDIA)    Difficulty of Paying Living Expenses: Not hard at all  Food Insecurity: No Food Insecurity (04/20/2023)   Hunger Vital Sign    Worried About Running Out of Food in the Last Year: Never true    Ran Out of Food in the Last Year: Never true  Transportation Needs: No Transportation Needs (04/20/2023)   PRAPARE - Administrator, Civil Service (Medical): No    Lack of Transportation (Non-Medical): No  Physical Activity: Sufficiently Active (11/06/2022)   Exercise Vital Sign     Days of Exercise per Week: 7 days    Minutes of Exercise per Session: 30 min  Stress: No Stress Concern Present (11/06/2022)   [employer] of Occupational Health - Occupational Stress Questionnaire    Feeling of Stress : Not at all  Social Connections: Socially Integrated (11/06/2022)   Social Connection and Isolation Panel    Frequency of Communication with Friends and Family: More than three times a week    Frequency of Social Gatherings with Friends and  Family: More than three times a week    Attends Religious Services: 1 to 4 times per year    Active Member of Clubs or Organizations: Yes    Attends Banker Meetings: More than 4 times per year    Marital Status: Living with partner  Intimate Partner Violence: Not At Risk (04/20/2023)   Humiliation, Afraid, Rape, and Kick questionnaire    Fear of Current or Ex-Partner: No    Emotionally Abused: No    Physically Abused: No    Sexually Abused: No    Subjective: Review of Systems  Constitutional:  Negative for chills and fever.  HENT:  Negative for congestion and hearing loss.   Eyes:  Negative for blurred vision and double vision.  Respiratory:  Negative for cough and shortness of breath.   Cardiovascular:  Negative for chest pain and palpitations.  Gastrointestinal:  Negative for abdominal pain, blood in stool, constipation, diarrhea, heartburn, melena and vomiting.  Genitourinary:  Negative for dysuria and urgency.  Musculoskeletal:  Negative for joint pain and myalgias.  Skin:  Negative for itching and rash.  Neurological:  Negative for dizziness and headaches.  Psychiatric/Behavioral:  Negative for depression. The patient is not nervous/anxious.        Objective: BP 112/73   Pulse 61   Temp 98.1 F (36.7 C)   Ht 6' 3 (1.905 m)   Wt 224 lb 6.4 oz (101.8 kg)   BMI 28.05 kg/m  Physical Exam Constitutional:      Appearance: Normal appearance.  HENT:     Head: Normocephalic and atraumatic.   Eyes:      Extraocular Movements: Extraocular movements intact.     Conjunctiva/sclera: Conjunctivae normal.    Cardiovascular:     Rate and Rhythm: Normal rate and regular rhythm.  Pulmonary:     Effort: Pulmonary effort is normal.     Breath sounds: Normal breath sounds.  Abdominal:     General: Bowel sounds are normal.     Palpations: Abdomen is soft.   Musculoskeletal:        General: Normal range of motion.     Cervical back: Normal range of motion and neck supple.   Skin:    General: Skin is warm.   Neurological:     General: No focal deficit present.     Mental Status: He is alert and oriented to person, place, and time.   Psychiatric:        Mood and Affect: Mood normal.        Behavior: Behavior normal.      Assessment: *Personal history of adenomatous colon polyps *Family history of colon cancer in father *Constipation-mild  Plan: Will schedule for surveillance colonoscopy.The risks including infection, bleed, or perforation as well as benefits, limitations, alternatives and imponderables have been reviewed with the patient. Questions have been answered. All parties agreeable.  For patient's constipation, I recommended taking MiraLAX  1 capful daily.  If this is not adequate then increase to 2 capfuls daily.  If this is still not adequate then add on once daily Dulcolax.  Encouraged to drink at least 6 glasses of water  daily.  Thank you Dr Zollie for the kind referral.  05/25/2024 2:04 PM   Disclaimer: This note was dictated with voice recognition software. Similar sounding words can inadvertently be transcribed and may not be corrected upon review.

## 2024-05-26 ENCOUNTER — Other Ambulatory Visit: Payer: Self-pay

## 2024-05-30 ENCOUNTER — Encounter: Payer: Self-pay | Admitting: Hematology

## 2024-05-30 ENCOUNTER — Inpatient Hospital Stay: Attending: Hematology | Admitting: Hematology

## 2024-05-30 ENCOUNTER — Inpatient Hospital Stay

## 2024-05-30 VITALS — BP 109/66 | HR 55 | Temp 97.9°F | Resp 16

## 2024-05-30 VITALS — Wt 226.6 lb

## 2024-05-30 DIAGNOSIS — Z5112 Encounter for antineoplastic immunotherapy: Secondary | ICD-10-CM | POA: Insufficient documentation

## 2024-05-30 DIAGNOSIS — C8318 Mantle cell lymphoma, lymph nodes of multiple sites: Secondary | ICD-10-CM

## 2024-05-30 DIAGNOSIS — Z87891 Personal history of nicotine dependence: Secondary | ICD-10-CM | POA: Insufficient documentation

## 2024-05-30 DIAGNOSIS — Z79620 Long term (current) use of immunosuppressive biologic: Secondary | ICD-10-CM | POA: Diagnosis not present

## 2024-05-30 DIAGNOSIS — C8310 Mantle cell lymphoma, unspecified site: Secondary | ICD-10-CM | POA: Insufficient documentation

## 2024-05-30 LAB — CBC WITH DIFFERENTIAL/PLATELET
Abs Immature Granulocytes: 0.02 10*3/uL (ref 0.00–0.07)
Basophils Absolute: 0 10*3/uL (ref 0.0–0.1)
Basophils Relative: 1 %
Eosinophils Absolute: 0 10*3/uL (ref 0.0–0.5)
Eosinophils Relative: 2 %
HCT: 38.2 % — ABNORMAL LOW (ref 39.0–52.0)
Hemoglobin: 13.3 g/dL (ref 13.0–17.0)
Immature Granulocytes: 1 %
Lymphocytes Relative: 20 %
Lymphs Abs: 0.5 10*3/uL — ABNORMAL LOW (ref 0.7–4.0)
MCH: 32.2 pg (ref 26.0–34.0)
MCHC: 34.8 g/dL (ref 30.0–36.0)
MCV: 92.5 fL (ref 80.0–100.0)
Monocytes Absolute: 0.3 10*3/uL (ref 0.1–1.0)
Monocytes Relative: 11 %
Neutro Abs: 1.7 10*3/uL (ref 1.7–7.7)
Neutrophils Relative %: 65 %
Platelets: 118 10*3/uL — ABNORMAL LOW (ref 150–400)
RBC: 4.13 MIL/uL — ABNORMAL LOW (ref 4.22–5.81)
RDW: 12.4 % (ref 11.5–15.5)
WBC: 2.6 10*3/uL — ABNORMAL LOW (ref 4.0–10.5)
nRBC: 0 % (ref 0.0–0.2)

## 2024-05-30 LAB — COMPREHENSIVE METABOLIC PANEL WITH GFR
ALT: 17 U/L (ref 0–44)
AST: 20 U/L (ref 15–41)
Albumin: 3.9 g/dL (ref 3.5–5.0)
Alkaline Phosphatase: 92 U/L (ref 38–126)
Anion gap: 9 (ref 5–15)
BUN: 14 mg/dL (ref 8–23)
CO2: 27 mmol/L (ref 22–32)
Calcium: 8.4 mg/dL — ABNORMAL LOW (ref 8.9–10.3)
Chloride: 103 mmol/L (ref 98–111)
Creatinine, Ser: 0.76 mg/dL (ref 0.61–1.24)
GFR, Estimated: >60 mL/min (ref >60)
Glucose, Bld: 136 mg/dL — ABNORMAL HIGH (ref 70–99)
Potassium: 4 mmol/L (ref 3.5–5.1)
Sodium: 139 mmol/L (ref 135–145)
Total Bilirubin: 0.6 mg/dL (ref 0.0–1.2)
Total Protein: 5.9 g/dL — ABNORMAL LOW (ref 6.5–8.1)

## 2024-05-30 LAB — MAGNESIUM: Magnesium: 2.3 mg/dL (ref 1.7–2.4)

## 2024-05-30 MED ORDER — CETIRIZINE HCL 10 MG/ML IV SOLN
10.0000 mg | Freq: Once | INTRAVENOUS | Status: AC
  Administered 2024-05-30: 10 mg via INTRAVENOUS
  Filled 2024-05-30: qty 1

## 2024-05-30 MED ORDER — HEPARIN SOD (PORK) LOCK FLUSH 100 UNIT/ML IV SOLN
500.0000 [IU] | Freq: Once | INTRAVENOUS | Status: AC | PRN
  Administered 2024-05-30: 500 [IU]

## 2024-05-30 MED ORDER — SODIUM CHLORIDE 0.9 % IV SOLN: Freq: Once | INTRAVENOUS | Status: AC

## 2024-05-30 MED ORDER — ACETAMINOPHEN 325 MG PO TABS
650.0000 mg | ORAL_TABLET | Freq: Once | ORAL | Status: AC
Start: 2024-05-30 — End: 2024-05-30
  Administered 2024-05-30: 650 mg via ORAL
  Filled 2024-05-30: qty 2

## 2024-05-30 MED ORDER — FAMOTIDINE IN NACL 20-0.9 MG/50ML-% IV SOLN
20.0000 mg | Freq: Once | INTRAVENOUS | Status: AC
  Administered 2024-05-30: 20 mg via INTRAVENOUS
  Filled 2024-05-30: qty 50

## 2024-05-30 MED ORDER — SODIUM CHLORIDE 0.9 % IV SOLN
375.0000 mg/m2 | Freq: Once | INTRAVENOUS | Status: AC
  Administered 2024-05-30: 900 mg via INTRAVENOUS
  Filled 2024-05-30: qty 50

## 2024-05-30 MED ORDER — SODIUM CHLORIDE 0.9% FLUSH
10.0000 mL | Freq: Once | INTRAVENOUS | Status: AC
  Administered 2024-05-30: 10 mL via INTRAVENOUS

## 2024-05-30 NOTE — Progress Notes (Signed)
 Patient has been examined by Dr. Ellin Saba. Vital signs and labs have been reviewed by MD - ANC, Creatinine, LFTs, hemoglobin, and platelets are within treatment parameters per M.D. - pt may proceed with treatment.  Primary RN and pharmacy notified.

## 2024-05-30 NOTE — Progress Notes (Signed)
 [facility] [address], [place] [address]    Clinic Day:  05/30/2024  Referring physician: Rogers Hai, MD  Patient Care Team: Zollie Lowers, MD as PCP - General (Family Medicine) Rogers Hai, MD as Medical Oncologist (Medical Oncology) Celestia Joesph SQUIBB, RN as Oncology Nurse Navigator (Medical Oncology)   ASSESSMENT & PLAN:   Assessment: 1.  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 - CT chest (12/12/2022): Large right hilar mass/bulky adenopathy measuring 7.5 x 6.4 cm encasing the right mainstem bronchus and lobar branches.  Additional mediastinal adenopathy, axillary and lower cervical adenopathy. - CT AP (12/12/2022): Severe splenomegaly measuring 21.5 cm.  Prominent retroperitoneal lymph nodes, left retroperitoneal nodes measuring 1.6 x 1.0 cm.  Subcapsular hypodense lesion of the peripheral liver dome measuring 1.6 x 1.1 cm.  Additional subcentimeter hypodense lesions too small to characterize. - No B symptoms.  Patient plays golf 3-4 times per week.  No recurrent infections. - CLL FISH panel with 13 q. deletion - T p53 mutation negative - IGHV hyper mutation present. - PET scan (01/08/2023): Marked right hilar adenopathy with lymph node conglomerate/large lymph node measuring 5 cm with SUV of 12.3.  There is hypermetabolic adenopathy above and below diaphragm with splenomegaly. - Bronchoscopy (01/28/2023) and biopsy of the right hilar mass by Dr. Icard - Pathology: CD5 positive B-cell lymphoproliferative disorder, cyclin D1 positive, favoring mantle cell lymphoma.  FISH for t(11:14) positive indicating mantle cell lymphoma. - 6 cycles of Bendamustine  and rituximab  from 03/02/2023 through 07/21/2023 - Posttreatment PET scan (08/27/2023): No findings of recurrent lymphoma.  Spleen decreased in size measuring 12 x 10 cm. - Maintenance rituximab  (12 doses) started  on 09/30/2023   2.  Social/family history: - Lives at home with his girlfriend.  Independent of ADLs and IADLs. - He retired 8 years ago after working at [employer] in [place].  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  Quit smoking in 1992.  Smoked 1 pack/day for 10 years. - Sister had breast cancer.  Another sister had ovarian and stomach cancer.  Father had colon cancer.  Mother had CLL.  Paternal aunt had lung cancer.  Another paternal aunt had breast cancer.    Plan: 1.  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  Matted right hilar lymph nodes measuring 19 x 12 mm similar to PET scan in September 2024 without FDG activity. - He is tolerating rituximab  very well.  Denies any infections in the past 2 months.  Denies fevers, night sweats or weight loss.  He is continuing to play golf 3 times a week. - He has colonoscopy scheduled for screening on 06/14/2024. - Reviewed labs from today: Normal LFTs and creatinine.  CBC shows mild thrombocytopenia which is stable.  Mild intermittent leukopenia since he completed chemotherapy has been stable.  Normal ANC. - Proceed with maintenance rituximab  today.  RTC 2 months for follow-up with repeat CT CAP with contrast.    Orders Placed This Encounter  Procedures   CT CHEST ABDOMEN PELVIS W CONTRAST    Standing Status:   Future    Expected Date:   07/19/2024    Expiration Date:   05/30/2025    If indicated for the ordered procedure, I authorize the administration of contrast media per Radiology protocol:   Yes    Does the patient have a contrast  media/X-ray dye allergy?:   No    Preferred imaging location?:   [facility]    If indicated for the ordered procedure, I authorize the administration of oral contrast media per Radiology protocol:   Yes      I,Helena R Teague,acting as a scribe for Alean Stands, MD.,have documented all relevant documentation  on the behalf of Alean Stands, MD,as directed by  Alean Stands, MD while in the presence of Alean Stands, MD.  I, Alean Stands MD, have reviewed the above documentation for accuracy and completeness, and I agree with the above.     Alean Stands, MD   6/30/202511:24 AM  CHIEF COMPLAINT:   Diagnosis: mantle cell lymphoma    Cancer Staging  Mantle cell lymphoma (HCC) Staging form: Hodgkin and Non-Hodgkin Lymphoma, AJCC 8th Edition - Clinical stage from 02/11/2023: Stage III (Mantle cell lymphoma) - Signed by Stands Alean, MD on 02/11/2023    Prior Therapy: Bendamustine  and rituximab  every 28 days for 6 cycles, 03/02/23 - 07/21/23   Current Therapy:  maintenance rituximab     HISTORY OF PRESENT ILLNESS:   Oncology History  Mantle cell lymphoma (HCC)  02/11/2023 Initial Diagnosis   Mantle cell lymphoma (HCC)   02/11/2023 Cancer Staging   Staging form: Hodgkin and Non-Hodgkin Lymphoma, AJCC 8th Edition - Clinical stage from 02/11/2023: Stage III (Mantle cell lymphoma) - Signed by Stands Alean, MD on 02/11/2023 Histopathologic type: Mantle cell lymphoma (Includes all variants: blastic, pleomorphic, small cell) Stage prefix: Initial diagnosis   03/02/2023 - 07/22/2023 Chemotherapy   Patient is on Treatment Plan : NON-HODGKINS LYMPHOMA Rituximab  D1 + Bendamustine  D1,2 q28d x 6 cycles     09/30/2023 -  Chemotherapy   Patient is on Treatment Plan : NON-HODGKINS LYMPHOMA Rituximab  q60d Maintenance        INTERVAL HISTORY:   Travis Wood is a 73 y.o. male presenting to clinic today for follow up of mantle cell lymphoma. He was last seen by me on 03/29/24.  Today, he states that he is doing well overall. His appetite level is at 100%. His energy level is at 100%.   PAST MEDICAL HISTORY:   Past Medical History: Past Medical History:  Diagnosis Date   Anemia    Cataract    CLL (chronic lymphocytic leukemia) (HCC)    Erectile dysfunction     History of bronchitis    History of colon polyps    Hyperlipemia    Osteoarthritis    Shortness of breath dyspnea    with exertion    Surgical History: Past Surgical History:  Procedure Laterality Date   APPENDECTOMY     BRONCHIAL NEEDLE ASPIRATION BIOPSY  01/28/2023   Procedure: BRONCHIAL NEEDLE ASPIRATION BIOPSIES;  Surgeon: Brenna Adine CROME, DO;  Location: [facility];  Service: Cardiopulmonary;;   CATARACT EXTRACTION W/PHACO Left 10/15/2015   Procedure: CATARACT EXTRACTION PHACO AND INTRAOCULAR LENS PLACEMENT LEFT EYE;  Surgeon: Cherene Mania, MD;  Location: [facility];  Service: Ophthalmology;  Laterality: Left;  CDE:12.20   CATARACT EXTRACTION W/PHACO Right 11/12/2015   Procedure: CATARACT EXTRACTION PHACO AND INTRAOCULAR LENS PLACEMENT RIGHT EYE:  CDE:  8.99;  Surgeon: Cherene Mania, MD;  Location: [facility];  Service: Ophthalmology;  Laterality: Right;   IR IMAGING GUIDED PORT INSERTION  03/04/2023   KNEE SURGERY     Left x 2    MASS EXCISION N/A 09/12/2015   Procedure: EXCISION SOFT TISSUE NEOPLASM (6 CM), BACK;  Surgeon: Oneil Budge, MD;  Location: [facility];  Service: General;  Laterality: N/A;   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   Procedure: TOTAL KNEE ARTHROPLASTY;  Surgeon: Ernie Cough, MD;  Location: [facility];  Service: Orthopedics;  Laterality: Right;  70 mins   VIDEO BRONCHOSCOPY WITH ENDOBRONCHIAL ULTRASOUND Bilateral 01/28/2023   Procedure: VIDEO BRONCHOSCOPY WITH ENDOBRONCHIAL ULTRASOUND;  Surgeon: Brenna Adine CROME, DO;  Location: [facility];  Service: Cardiopulmonary;  Laterality: Bilateral;    Social History: Social History   Socioeconomic History   Marital status: Media planner    Spouse name: Not on file   Number of children: 2   Years of education: 12 years   Highest education level: 12th grade  Occupational History   Occupation: RETIRED  Tobacco Use   Smoking status: Former    Current packs/day: 0.00    Average packs/day: 1 pack/day for 10.0 years (10.0  ttl pk-yrs)    Types: Cigarettes, Pipe    Start date: 09/09/1981    Quit date: 09/10/1991    Years since quitting: 32.7   Smokeless tobacco: Never   Tobacco comments:    Smoked a pipe occasionally for 3 years.  Vaping Use   Vaping status: Never Used  Substance and Sexual Activity   Alcohol use: Not Currently    Alcohol/week: 1.0 standard drink of alcohol    Types: 1 Cans of beer per week   Drug use: Yes    Frequency: 1.0 times per week    Types: Marijuana   Sexual activity: Yes  Other Topics Concern   Not on file  Social History Narrative   2 sons who live close by   Lives with girlfriend   Social Drivers of Corporate investment banker Strain: Low Risk  (11/06/2022)   Overall Financial Resource Strain (CARDIA)    Difficulty of Paying Living Expenses: Not hard at all  Food Insecurity: No Food Insecurity (04/20/2023)   Hunger Vital Sign    Worried About Running Out of Food in the Last Year: Never true    Ran Out of Food in the Last Year: Never true  Transportation Needs: No Transportation Needs (04/20/2023)   PRAPARE - Administrator, Civil Service (Medical): No    Lack of Transportation (Non-Medical): No  Physical Activity: Sufficiently Active (11/06/2022)   Exercise Vital Sign    Days of Exercise per Week: 7 days    Minutes of Exercise per Session: 30 min  Stress: No Stress Concern Present (11/06/2022)   [employer] of Occupational Health - Occupational Stress Questionnaire    Feeling of Stress : Not at all  Social Connections: Socially Integrated (11/06/2022)   Social Connection and Isolation Panel    Frequency of Communication with Friends and Family: More than three times a week    Frequency of Social Gatherings with Friends and Family: More than three times a week    Attends Religious Services: 1 to 4 times per year    Active Member of [employer] or Organizations: Yes    Attends Banker Meetings: More than 4 times per year    Marital Status:  Living with partner  Intimate Partner Violence: Not At Risk (04/20/2023)   Humiliation, Afraid, Rape, and Kick questionnaire    Fear of Current or Ex-Partner: No    Emotionally Abused: No    Physically Abused: No    Sexually Abused: No    Family History: Family History  Problem Relation Age of Onset   Colon cancer Father        age 33  Colon polyps Father    Heart disease Father        Heart failure   Heart disease Mother    Cancer Sister        Uterine, and ovarian   Hypertension Brother    Obesity Sister    Heart disease Maternal Grandmother    Esophageal cancer Neg Hx    Liver cancer Neg Hx    Pancreatic cancer Neg Hx    Rectal cancer Neg Hx    Stomach cancer Neg Hx     Current Medications:  Current Outpatient Medications:    antiseptic oral rinse (BIOTENE) LIQD, 15 mLs by Mouth Rinse route as needed for dry mouth., Disp: , Rfl:    atorvastatin  (LIPITOR) 40 MG tablet, Take 1 tablet (40 mg total) by mouth daily., Disp: 90 tablet, Rfl: 3   BENDAMUSTINE  HCL IV, Inject into the vein every 28 (twenty-eight) days. Days 1 and 2 every 28 days, Disp: , Rfl:    ferrous sulfate  325 (65 FE) MG tablet, Take 325 mg by mouth daily with breakfast., Disp: , Rfl:    Multiple Vitamin (MULTIVITAMIN WITH MINERALS) TABS tablet, Take 1 tablet by mouth daily., Disp: , Rfl:    OLIVE LEAF EXTRACT PO, Take 1 tablet by mouth in the morning and at bedtime., Disp: , Rfl:    OVER THE COUNTER MEDICATION, Take 1 each by mouth 5 (five) times daily. [name], Disp: , Rfl:    riTUXimab  (RITUXAN  IV), Inject into the vein every 28 (twenty-eight) days., Disp: , Rfl:    Vitamin D , Ergocalciferol , (DRISDOL ) 1.25 MG (50000 UNIT) CAPS capsule, Take 1 capsule (50,000 Units total) by mouth every 7 (seven) days., Disp: 13 capsule, Rfl: 3   Allergies: No Known Allergies  REVIEW OF SYSTEMS:   Review of Systems  Constitutional:  Negative for chills, fatigue and fever.  HENT:   Negative for lump/mass, mouth  sores, nosebleeds, sore throat and trouble swallowing.   Eyes:  Negative for eye problems.  Respiratory:  Negative for cough and shortness of breath.   Cardiovascular:  Negative for chest pain, leg swelling and palpitations.  Gastrointestinal:  Negative for abdominal pain, constipation, diarrhea, nausea and vomiting.  Genitourinary:  Negative for bladder incontinence, difficulty urinating, dysuria, frequency, hematuria and nocturia.   Musculoskeletal:  Negative for arthralgias, back pain, flank pain, myalgias and neck pain.  Skin:  Negative for itching and rash.  Neurological:  Negative for dizziness, headaches and numbness.  Hematological:  Does not bruise/bleed easily.  Psychiatric/Behavioral:  Negative for depression, sleep disturbance and suicidal ideas. The patient is not nervous/anxious.   All other systems reviewed and are negative.    VITALS:   Weight 226 lb 9.6 oz (102.8 kg).  Wt Readings from Last 3 Encounters:  05/30/24 226 lb 9.6 oz (102.8 kg)  05/25/24 224 lb 6.4 oz (101.8 kg)  04/11/24 225 lb (102.1 kg)    Body mass index is 28.32 kg/m.  Performance status (ECOG): 1 - Symptomatic but completely ambulatory  PHYSICAL EXAM:   Physical Exam Vitals and nursing note reviewed. Exam conducted with a chaperone present.  Constitutional:      Appearance: Normal appearance.   Cardiovascular:     Rate and Rhythm: Normal rate and regular rhythm.     Pulses: Normal pulses.     Heart sounds: Normal heart sounds.  Pulmonary:     Effort: Pulmonary effort is normal.     Breath sounds: Normal breath sounds.  Abdominal:  Palpations: Abdomen is soft. There is no hepatomegaly, splenomegaly or mass.     Tenderness: There is no abdominal tenderness.   Musculoskeletal:     Right lower leg: No edema.     Left lower leg: No edema.  Lymphadenopathy:     Cervical: No cervical adenopathy.     Right cervical: No superficial, deep or posterior cervical adenopathy.    Left cervical:  No superficial, deep or posterior cervical adenopathy.     Upper Body:     Right upper body: No supraclavicular or axillary adenopathy.     Left upper body: No supraclavicular or axillary adenopathy.   Neurological:     General: No focal deficit present.     Mental Status: He is alert and oriented to person, place, and time.   Psychiatric:        Mood and Affect: Mood normal.        Behavior: Behavior normal.     LABS:   CBC     Component Value Date/Time   WBC 2.6 (L) 05/30/2024 1012   RBC 4.13 (L) 05/30/2024 1012   HGB 13.3 05/30/2024 1012   HGB CANCELED 11/12/2022 1034   HCT 38.2 (L) 05/30/2024 1012   HCT CANCELED 11/12/2022 1034   PLT 118 (L) 05/30/2024 1012   PLT CANCELED 11/12/2022 1034   MCV 92.5 05/30/2024 1012   MCV CANCELED 11/12/2022 1034   MCH 32.2 05/30/2024 1012   MCHC 34.8 05/30/2024 1012   RDW 12.4 05/30/2024 1012   RDW CANCELED 11/12/2022 1034   LYMPHSABS 0.5 (L) 05/30/2024 1012   LYMPHSABS CANCELED 11/12/2022 1034   MONOABS 0.3 05/30/2024 1012   EOSABS 0.0 05/30/2024 1012   EOSABS CANCELED 11/12/2022 1034   BASOSABS 0.0 05/30/2024 1012   BASOSABS CANCELED 11/12/2022 1034    CMP      Component Value Date/Time   NA 139 05/30/2024 1012   NA 142 04/11/2024 1408   K 4.0 05/30/2024 1012   CL 103 05/30/2024 1012   CO2 27 05/30/2024 1012   GLUCOSE 136 (H) 05/30/2024 1012   BUN 14 05/30/2024 1012   BUN 15 04/11/2024 1408   CREATININE 0.76 05/30/2024 1012   CALCIUM  8.4 (L) 05/30/2024 1012   PROT 5.9 (L) 05/30/2024 1012   PROT 6.0 04/11/2024 1408   ALBUMIN 3.9 05/30/2024 1012   ALBUMIN 4.6 04/11/2024 1408   AST 20 05/30/2024 1012   ALT 17 05/30/2024 1012   ALKPHOS 92 05/30/2024 1012   BILITOT 0.6 05/30/2024 1012   BILITOT 0.3 04/11/2024 1408   GFRNONAA >60 05/30/2024 1012   GFRAA 96 05/30/2020 0932     No results found for: CEA1, CEA / No results found for: CEA1, CEA Lab Results  Component Value Date   PSA1 0.4 04/11/2024   No  results found for: RJW800 No results found for: RJW874  Lab Results  Component Value Date   TOTALPROTELP 6.0 01/26/2019   ALBUMINELP 3.9 01/26/2019   A1GS 0.2 01/26/2019   A2GS 0.5 01/26/2019   BETS 0.9 01/26/2019   GAMS 0.5 01/26/2019   MSPIKE Not Observed 01/26/2019   SPEI Comment 01/26/2019   Lab Results  Component Value Date   TIBC 310 08/21/2021   FERRITIN 421 (H) 08/21/2021   IRONPCTSAT 20 08/21/2021   Lab Results  Component Value Date   LDH 129 08/27/2023   LDH 128 07/21/2023   LDH 120 06/23/2023     STUDIES:   No results found.

## 2024-05-30 NOTE — Patient Instructions (Signed)

## 2024-05-30 NOTE — Patient Instructions (Signed)
 [facility] [place] - A DEPT OF [facility]  Discharge Instructions: Thank you for choosing [facility] to provide your oncology and hematology care.  If you have a lab appointment with the Cancer Center - please note that after April 8th, 2024, all labs will be drawn in the cancer center.  You do not have to check in or register with the main entrance as you have in the past but will complete your check-in in the cancer center.  Wear comfortable clothing and clothing appropriate for easy access to any Portacath or PICC line.   We strive to give you quality time with your provider. You may need to reschedule your appointment if you arrive late (15 or more minutes).  Arriving late affects you and other patients whose appointments are after yours.  Also, if you miss three or more appointments without notifying the office, you may be dismissed from the clinic at the provider's discretion.      For prescription refill requests, have your pharmacy contact our office and allow 72 hours for refills to be completed.    Today you received the following chemotherapy and/or immunotherapy agents Rituxan      To help prevent nausea and vomiting after your treatment, we encourage you to take your nausea medication as directed.  BELOW ARE SYMPTOMS THAT SHOULD BE REPORTED IMMEDIATELY: *FEVER GREATER THAN 100.4 F (38 C) OR HIGHER *CHILLS OR SWEATING *NAUSEA AND VOMITING THAT IS NOT CONTROLLED WITH YOUR NAUSEA MEDICATION *UNUSUAL SHORTNESS OF BREATH *UNUSUAL BRUISING OR BLEEDING *URINARY PROBLEMS (pain or burning when urinating, or frequent urination) *BOWEL PROBLEMS (unusual diarrhea, constipation, pain near the anus) TENDERNESS IN MOUTH AND THROAT WITH OR WITHOUT PRESENCE OF ULCERS (sore throat, sores in mouth, or a toothache) UNUSUAL RASH, SWELLING OR PAIN  UNUSUAL VAGINAL DISCHARGE OR ITCHING   Items with * indicate a potential emergency and should be followed up  as soon as possible or go to the Emergency Department if any problems should occur.  Please show the CHEMOTHERAPY ALERT CARD or IMMUNOTHERAPY ALERT CARD at check-in to the Emergency Department and triage nurse.  Should you have questions after your visit or need to cancel or reschedule your appointment, please contact [facility] [place] - A DEPT OF [facility] [phone]  and follow the prompts.  Office hours are 8:00 a.m. to 4:30 p.m. Monday - Friday. Please note that voicemails left after 4:00 p.m. may not be returned until the following business day.  We are closed weekends and major holidays. You have access to a nurse at all times for urgent questions. Please call the main number to the clinic [phone] and follow the prompts.  For any non-urgent questions, you may also contact your provider using MyChart. We now offer e-Visits for anyone 77 and older to request care online for non-urgent symptoms. For details visit [email or web address].   Also download the MyChart app! Go to the app store, search "MyChart", open the app, select [facility], and log in with your MyChart username and password.

## 2024-05-30 NOTE — Progress Notes (Signed)
 Patient tolerated Rituxan  infusion with no complaints voiced.  Side effects with management reviewed with understanding verbalized.  Port site clean and dry with no bruising or swelling noted at site.  Good blood return noted before and after administration of chemotherapy.  Band aid applied.  Patient left in satisfactory condition with VSS and no s/s of distress noted. All follow ups as scheduled.   Travis Wood

## 2024-05-31 ENCOUNTER — Other Ambulatory Visit: Payer: Self-pay

## 2024-06-07 ENCOUNTER — Other Ambulatory Visit: Payer: Self-pay

## 2024-06-14 ENCOUNTER — Encounter (HOSPITAL_COMMUNITY): Payer: Self-pay | Admitting: Internal Medicine

## 2024-06-14 ENCOUNTER — Ambulatory Visit (HOSPITAL_COMMUNITY): Admitting: Anesthesiology

## 2024-06-14 ENCOUNTER — Other Ambulatory Visit: Payer: Self-pay

## 2024-06-14 ENCOUNTER — Encounter (HOSPITAL_COMMUNITY)
Admission: RE | Disposition: A | Discharge status: Home / Self Care | Payer: Self-pay | Source: Home / Self Care | Attending: Internal Medicine

## 2024-06-14 ENCOUNTER — Ambulatory Visit (HOSPITAL_COMMUNITY)
Admission: RE | Admit: 2024-06-14 | Discharge: 2024-06-14 | Disposition: A | Discharge status: Home / Self Care | Attending: Internal Medicine | Admitting: Internal Medicine

## 2024-06-14 DIAGNOSIS — K5900 Constipation, unspecified: Secondary | ICD-10-CM | POA: Insufficient documentation

## 2024-06-14 DIAGNOSIS — K5730 Diverticulosis of large intestine without perforation or abscess without bleeding: Secondary | ICD-10-CM | POA: Insufficient documentation

## 2024-06-14 DIAGNOSIS — Z1211 Encounter for screening for malignant neoplasm of colon: Secondary | ICD-10-CM

## 2024-06-14 DIAGNOSIS — Z800 Family history of malignant neoplasm of digestive organs: Secondary | ICD-10-CM | POA: Diagnosis not present

## 2024-06-14 DIAGNOSIS — D122 Benign neoplasm of ascending colon: Secondary | ICD-10-CM | POA: Diagnosis not present

## 2024-06-14 DIAGNOSIS — Z85118 Personal history of other malignant neoplasm of bronchus and lung: Secondary | ICD-10-CM | POA: Insufficient documentation

## 2024-06-14 DIAGNOSIS — C9110 Chronic lymphocytic leukemia of B-cell type not having achieved remission: Secondary | ICD-10-CM | POA: Diagnosis not present

## 2024-06-14 DIAGNOSIS — D125 Benign neoplasm of sigmoid colon: Secondary | ICD-10-CM | POA: Diagnosis not present

## 2024-06-14 DIAGNOSIS — K648 Other hemorrhoids: Secondary | ICD-10-CM | POA: Insufficient documentation

## 2024-06-14 DIAGNOSIS — Z87891 Personal history of nicotine dependence: Secondary | ICD-10-CM | POA: Insufficient documentation

## 2024-06-14 DIAGNOSIS — K635 Polyp of colon: Secondary | ICD-10-CM | POA: Diagnosis not present

## 2024-06-14 DIAGNOSIS — Z860101 Personal history of adenomatous and serrated colon polyps: Secondary | ICD-10-CM

## 2024-06-14 HISTORY — PX: COLONOSCOPY: SHX5424

## 2024-06-14 SURGERY — COLONOSCOPY
Anesthesia: General

## 2024-06-14 MED ORDER — LACTATED RINGERS IV SOLN: INTRAVENOUS | Status: DC

## 2024-06-14 MED ORDER — SODIUM CHLORIDE 0.9% FLUSH: 3.0000 mL | Freq: Two times a day (BID) | INTRAVENOUS | Status: DC

## 2024-06-14 MED ORDER — SODIUM CHLORIDE 0.9% FLUSH: 3.0000 mL | INTRAVENOUS | Status: DC | PRN

## 2024-06-14 MED ORDER — PROPOFOL 500 MG/50ML IV EMUL
INTRAVENOUS | Status: DC | PRN
  Administered 2024-06-14: 60 mg via INTRAVENOUS
  Administered 2024-06-14: 125 ug/kg/min via INTRAVENOUS

## 2024-06-14 MED ORDER — GLYCOPYRROLATE PF 0.2 MG/ML IJ SOSY
PREFILLED_SYRINGE | INTRAMUSCULAR | Status: DC | PRN
  Administered 2024-06-14: .2 mg via INTRAVENOUS

## 2024-06-14 MED ORDER — LACTATED RINGERS IV SOLN: INTRAVENOUS | Status: DC | PRN

## 2024-06-14 NOTE — Op Note (Signed)
 [facility] Patient Name: Travis Wood Procedure Date: 06/14/2024 10:54 AM MRN: [id number] Date of Birth: [birth year] Attending MD: Carlin POUR. Cindie HAS, [id number] CSN: [id number] Age: 73 Admit Type: Outpatient Procedure:                Colonoscopy Indications:              Screening in patient at increased risk: Colorectal                            cancer in father before age 35, High risk colon                            cancer surveillance: Personal history of colonic                            polyps Providers:                Carlin POUR. Cindie, DO, Devere Lodge, Jon Loge Referring MD:              Medicines:                See the Anesthesia note for documentation of the                            administered medications Complications:            No immediate complications. Estimated Blood Loss:     Estimated blood loss was minimal. Procedure:                Pre-Anesthesia Assessment:                           - The anesthesia plan was to use monitored                            anesthesia care (MAC).                           After obtaining informed consent, the colonoscope                            was passed under direct vision. Throughout the                            procedure, the patient's blood pressure, pulse, and                            oxygen saturations were monitored continuously. The                            [id number]-HQ190L ([id number]) scope was introduced through                            the anus and advanced to the the cecum, identified                            by appendiceal  orifice and ileocecal valve. The                            colonoscopy was performed without difficulty. The                            patient tolerated the procedure well. The quality                            of the bowel preparation was evaluated using the                            BBPS [facility] Bowel Preparation Scale) with scores                            of: Right  Colon = 3, Transverse Colon = 3 and Left                            Colon = 3 (entire mucosa seen well with no residual                            staining, small fragments of stool or opaque                            liquid). The total BBPS score equals 9. Scope In: 11:07:02 AM Scope Out: 11:22:18 AM Scope Withdrawal Time: 0 hours 11 minutes 2 seconds  Total Procedure Duration: 0 hours 15 minutes 16 seconds  Findings:      Non-bleeding internal hemorrhoids were found.      Multiple large-mouthed and small-mouthed diverticula were found in the       sigmoid colon and descending colon.      An 8 mm polyp was found in the ascending colon. The polyp was sessile.       The polyp was removed with a cold snare. Resection and retrieval were       complete.      A 2 mm polyp was found in the sigmoid colon. The polyp was sessile. The       polyp was removed with a cold biopsy forceps. Resection and retrieval       were complete.      The exam was otherwise without abnormality. Impression:               - Non-bleeding internal hemorrhoids.                           - Diverticulosis in the sigmoid colon and in the                            descending colon.                           - One 8 mm polyp in the ascending colon, removed                            with a cold snare. Resected  and retrieved.                           - One 2 mm polyp in the sigmoid colon, removed with                            a cold biopsy forceps. Resected and retrieved.                           - The examination was otherwise normal. Moderate Sedation:      Per Anesthesia Care Recommendation:           - Patient has a contact number available for                            emergencies. The signs and symptoms of potential                            delayed complications were discussed with the                            patient. Return to normal activities tomorrow.                            Written discharge  instructions were provided to the                            patient.                           - Resume previous diet.                           - Continue present medications.                           - Await pathology results.                           - Repeat colonoscopy in 5 years for surveillance                            and family history of colon cancer in father age                            <15.                           - Return to GI clinic PRN. Procedure Code(s):        --- Professional ---                           [phone], Colonoscopy, flexible; with removal of                            tumor(s), polyp(s), or other lesion(s) by snare  technique                           45380, 59, Colonoscopy, flexible; with biopsy,                            single or multiple Diagnosis Code(s):        --- Professional ---                           Z80.0, Family history of malignant neoplasm of                            digestive organs                           Z86.010, Personal history of colonic polyps                           K64.8, Other hemorrhoids                           D12.2, Benign neoplasm of ascending colon                           D12.5, Benign neoplasm of sigmoid colon                           K57.30, Diverticulosis of large intestine without                            perforation or abscess without bleeding CPT copyright 2022 [employer]. All rights reserved. The codes documented in this report are preliminary and upon coder review may  be revised to meet current compliance requirements. Carlin POUR. Cindie, DO Carlin POUR. Cindie, DO 06/14/2024 11:26:33 AM This report has been signed electronically. Number of Addenda: 0

## 2024-06-14 NOTE — Anesthesia Preprocedure Evaluation (Addendum)
 Anesthesia Evaluation  Patient identified by MRN, date of birth, ID band Patient awake    Reviewed: Allergy & Precautions, H&P , NPO status , Patient's Chart, lab work & pertinent test results, reviewed documented beta blocker date and time   Airway Mallampati: II  TM Distance: >3 FB Neck ROM: full    Dental  (+) Edentulous Upper, Edentulous Lower   Pulmonary shortness of breath, former smoker Bronchitis. RLL lung mass   Pulmonary exam normal breath sounds clear to auscultation       Cardiovascular Exercise Tolerance: Good negative cardio ROS Normal cardiovascular exam Rhythm:regular Rate:Normal     Neuro/Psych negative neurological ROS  negative psych ROS   GI/Hepatic negative GI ROS, Neg liver ROS,,,  Endo/Other  negative endocrine ROS    Renal/GU negative Renal ROS  negative genitourinary   Musculoskeletal  (+) Arthritis , Osteoarthritis,    Abdominal   Peds  Hematology negative hematology ROS (+)   Anesthesia Other Findings CLL  Reproductive/Obstetrics negative OB ROS                              Anesthesia Physical Anesthesia Plan  ASA: 2  Anesthesia Plan: General   Post-op Pain Management: Minimal or no pain anticipated   Induction:   PONV Risk Score and Plan: Propofol  infusion  Airway Management Planned: Natural Airway and Nasal Cannula  Additional Equipment: None  Intra-op Plan:   Post-operative Plan:   Informed Consent: I have reviewed the patients History and Physical, chart, labs and discussed the procedure including the risks, benefits and alternatives for the proposed anesthesia with the patient or authorized representative who has indicated his/her understanding and acceptance.     Dental Advisory Given  Plan Discussed with: CRNA  Anesthesia Plan Comments:          Anesthesia Quick Evaluation

## 2024-06-14 NOTE — Anesthesia Postprocedure Evaluation (Signed)
 Anesthesia Post Note  Patient: Travis Wood  Procedure(s) Performed: COLONOSCOPY  Patient location during evaluation: Endoscopy Anesthesia Type: General Level of consciousness: awake and alert Pain management: pain level controlled Vital Signs Assessment: post-procedure vital signs reviewed and stable Respiratory status: spontaneous breathing, nonlabored ventilation and respiratory function stable Cardiovascular status: blood pressure returned to baseline and stable Postop Assessment: no apparent nausea or vomiting Anesthetic complications: no   There were no known notable events for this encounter.   Last Vitals:  Vitals:   06/14/24 1045 06/14/24 1128  BP: (!) 147/69 (!) 112/59  Pulse: (!) 56 72  Resp: 13 16  Temp: 36.6 C 36.6 C  SpO2: 98% 98%    Last Pain:  Vitals:   06/14/24 1128  TempSrc: Oral  PainSc: 0-No pain                 Kirke Breach L Taite Baldassari

## 2024-06-14 NOTE — Discharge Instructions (Addendum)
  Colonoscopy Discharge Instructions  Read the instructions outlined below and refer to this sheet in the next few weeks. These discharge instructions provide you with general information on caring for yourself after you leave the hospital. Your doctor may also give you specific instructions. While your treatment has been planned according to the most current medical practices available, unavoidable complications occasionally occur.   ACTIVITY You may resume your regular activity, but move at a slower pace for the next 24 hours.  Take frequent rest periods for the next 24 hours.  Walking will help get rid of the air and reduce the bloated feeling in your belly (abdomen).  No driving for 24 hours (because of the medicine (anesthesia) used during the test).   Do not sign any important legal documents or operate any machinery for 24 hours (because of the anesthesia used during the test).  NUTRITION Drink plenty of fluids.  You may resume your normal diet as instructed by your doctor.  Begin with a light meal and progress to your normal diet. Heavy or fried foods are harder to digest and may make you feel sick to your stomach (nauseated).  Avoid alcoholic beverages for 24 hours or as instructed.  MEDICATIONS You may resume your normal medications unless your doctor tells you otherwise.  WHAT YOU CAN EXPECT TODAY Some feelings of bloating in the abdomen.  Passage of more gas than usual.  Spotting of blood in your stool or on the toilet paper.  IF YOU HAD POLYPS REMOVED DURING THE COLONOSCOPY: No aspirin  products for 7 days or as instructed.  No alcohol for 7 days or as instructed.  Eat a soft diet for the next 24 hours.  FINDING OUT THE RESULTS OF YOUR TEST Not all test results are available during your visit. If your test results are not back during the visit, make an appointment with your caregiver to find out the results. Do not assume everything is normal if you have not heard from your  caregiver or the medical facility. It is important for you to follow up on all of your test results.  SEEK IMMEDIATE MEDICAL ATTENTION IF: You have more than a spotting of blood in your stool.  Your belly is swollen (abdominal distention).  You are nauseated or vomiting.  You have a temperature over 101.  You have abdominal pain or discomfort that is severe or gets worse throughout the day.   Your colonoscopy revealed 2 polyp(s) which I removed successfully. Await pathology results, my office will contact you. I recommend repeating colonoscopy in 5 years for surveillance purposes.   You also have diverticulosis and internal hemorrhoids. I would recommend increasing fiber in your diet or adding OTC Benefiber/Metamucil. Be sure to drink at least 4 to 6 glasses of water  daily. Follow-up with GI as needed.  GO YANKS  I hope you have a great rest of your week!  Carlin POUR. Cindie, D.O. Gastroenterology and Hepatology [facility]

## 2024-06-14 NOTE — Transfer of Care (Signed)
 Immediate Anesthesia Transfer of Care Note  Patient: Travis Wood  Procedure(s) Performed: COLONOSCOPY  Patient Location: Endoscopy Unit  Anesthesia Type:General  Level of Consciousness: awake, alert , and oriented  Airway & Oxygen Therapy: Patient Spontanous Breathing  Post-op Assessment: Report given to RN and Post -op Vital signs reviewed and stable  Post vital signs: Reviewed and stable  Last Vitals:  Vitals Value Taken Time  BP 112/59 06/14/24 11:28  Temp 36.6 C 06/14/24 11:28  Pulse 72 06/14/24 11:28  Resp 16 06/14/24 11:28  SpO2 98 % 06/14/24 11:28    Last Pain:  Vitals:   06/14/24 1128  TempSrc: Oral  PainSc: 0-No pain      Patients Stated Pain Goal: 3 (06/14/24 1045)  Complications: No notable events documented.

## 2024-06-14 NOTE — Interval H&P Note (Signed)
 History and Physical Interval Note:  06/14/2024 10:54 AM  Travis LITTIE Wood  has presented today for surgery, with the diagnosis of hx polyps, family history colon cancer.  The various methods of treatment have been discussed with the patient and family. After consideration of risks, benefits and other options for treatment, the patient has consented to  Procedure(s) with comments: COLONOSCOPY (N/A) - 1:15pm, ok rm 1-2 as a surgical intervention.  The patient's history has been reviewed, patient examined, no change in status, stable for surgery.  I have reviewed the patient's chart and labs.  Questions were answered to the patient's satisfaction.     Carlin MARLA Hasty

## 2024-06-15 ENCOUNTER — Ambulatory Visit: Payer: Self-pay | Admitting: Internal Medicine

## 2024-06-15 LAB — SURGICAL PATHOLOGY

## 2024-06-16 ENCOUNTER — Encounter (HOSPITAL_COMMUNITY): Payer: Self-pay | Admitting: Internal Medicine

## 2024-06-21 NOTE — Progress Notes (Signed)
 Done

## 2024-07-06 ENCOUNTER — Other Ambulatory Visit: Payer: Self-pay

## 2024-07-26 ENCOUNTER — Ambulatory Visit (HOSPITAL_COMMUNITY)
Admission: RE | Admit: 2024-07-26 | Discharge: 2024-07-26 | Disposition: A | Discharge status: Home / Self Care | Source: Ambulatory Visit | Attending: Hematology | Admitting: Hematology

## 2024-07-26 DIAGNOSIS — C8318 Mantle cell lymphoma, lymph nodes of multiple sites: Secondary | ICD-10-CM | POA: Insufficient documentation

## 2024-07-26 MED ORDER — IOHEXOL 300 MG/ML  SOLN
100.0000 mL | Freq: Once | INTRAMUSCULAR | Status: AC | PRN
  Administered 2024-07-26: 100 mL via INTRAVENOUS

## 2024-08-02 ENCOUNTER — Inpatient Hospital Stay: Admitting: Oncology

## 2024-08-02 ENCOUNTER — Inpatient Hospital Stay

## 2024-08-02 ENCOUNTER — Inpatient Hospital Stay: Attending: Hematology

## 2024-08-02 ENCOUNTER — Encounter: Payer: Self-pay | Admitting: Oncology

## 2024-08-02 VITALS — BP 135/68 | HR 50 | Temp 96.7°F | Resp 18 | Wt 224.0 lb

## 2024-08-02 VITALS — BP 131/64 | HR 50 | Temp 96.4°F | Resp 18 | Wt 225.1 lb

## 2024-08-02 DIAGNOSIS — C8318 Mantle cell lymphoma, lymph nodes of multiple sites: Secondary | ICD-10-CM | POA: Insufficient documentation

## 2024-08-02 DIAGNOSIS — Z5112 Encounter for antineoplastic immunotherapy: Secondary | ICD-10-CM | POA: Diagnosis present

## 2024-08-02 DIAGNOSIS — Z79620 Long term (current) use of immunosuppressive biologic: Secondary | ICD-10-CM | POA: Diagnosis not present

## 2024-08-02 LAB — CBC WITH DIFFERENTIAL/PLATELET
Abs Immature Granulocytes: 0.04 K/uL (ref 0.00–0.07)
Basophils Absolute: 0 K/uL (ref 0.0–0.1)
Basophils Relative: 1 %
Eosinophils Absolute: 0.1 K/uL (ref 0.0–0.5)
Eosinophils Relative: 3 %
HCT: 40.2 % (ref 39.0–52.0)
Hemoglobin: 13.6 g/dL (ref 13.0–17.0)
Immature Granulocytes: 2 %
Lymphocytes Relative: 26 %
Lymphs Abs: 0.7 K/uL (ref 0.7–4.0)
MCH: 31.2 pg (ref 26.0–34.0)
MCHC: 33.8 g/dL (ref 30.0–36.0)
MCV: 92.2 fL (ref 80.0–100.0)
Monocytes Absolute: 0.3 K/uL (ref 0.1–1.0)
Monocytes Relative: 12 %
Neutro Abs: 1.6 K/uL — ABNORMAL LOW (ref 1.7–7.7)
Neutrophils Relative %: 56 %
Platelets: 117 K/uL — ABNORMAL LOW (ref 150–400)
RBC: 4.36 MIL/uL (ref 4.22–5.81)
RDW: 12.4 % (ref 11.5–15.5)
WBC: 2.7 K/uL — ABNORMAL LOW (ref 4.0–10.5)
nRBC: 0 % (ref 0.0–0.2)

## 2024-08-02 LAB — COMPREHENSIVE METABOLIC PANEL WITH GFR
ALT: 19 U/L (ref 0–44)
AST: 21 U/L (ref 15–41)
Albumin: 4.1 g/dL (ref 3.5–5.0)
Alkaline Phosphatase: 84 U/L (ref 38–126)
Anion gap: 13 (ref 5–15)
BUN: 17 mg/dL (ref 8–23)
CO2: 26 mmol/L (ref 22–32)
Calcium: 8.9 mg/dL (ref 8.9–10.3)
Chloride: 101 mmol/L (ref 98–111)
Creatinine, Ser: 0.76 mg/dL (ref 0.61–1.24)
GFR, Estimated: >60 mL/min (ref >60)
Glucose, Bld: 95 mg/dL (ref 70–99)
Potassium: 4.2 mmol/L (ref 3.5–5.1)
Sodium: 140 mmol/L (ref 135–145)
Total Bilirubin: 0.9 mg/dL (ref 0.0–1.2)
Total Protein: 6.2 g/dL — ABNORMAL LOW (ref 6.5–8.1)

## 2024-08-02 LAB — MAGNESIUM: Magnesium: 2.2 mg/dL (ref 1.7–2.4)

## 2024-08-02 LAB — LACTATE DEHYDROGENASE: LDH: 284 U/L — ABNORMAL HIGH (ref 98–192)

## 2024-08-02 MED ORDER — SODIUM CHLORIDE 0.9% FLUSH: 10.0000 mL | INTRAVENOUS | Status: DC | PRN

## 2024-08-02 MED ORDER — CETIRIZINE HCL 10 MG/ML IV SOLN
10.0000 mg | Freq: Once | INTRAVENOUS | Status: AC
  Administered 2024-08-02: 10 mg via INTRAVENOUS
  Filled 2024-08-02: qty 1

## 2024-08-02 MED ORDER — SODIUM CHLORIDE 0.9 % IV SOLN
375.0000 mg/m2 | Freq: Once | INTRAVENOUS | Status: AC
  Administered 2024-08-02: 900 mg via INTRAVENOUS
  Filled 2024-08-02: qty 50

## 2024-08-02 MED ORDER — ACETAMINOPHEN 325 MG PO TABS
650.0000 mg | ORAL_TABLET | Freq: Once | ORAL | Status: AC
  Administered 2024-08-02: 650 mg via ORAL
  Filled 2024-08-02: qty 2

## 2024-08-02 MED ORDER — HEPARIN SOD (PORK) LOCK FLUSH 100 UNIT/ML IV SOLN: 500.0000 [IU] | Freq: Once | INTRAVENOUS | Status: DC | PRN

## 2024-08-02 MED ORDER — SODIUM CHLORIDE 0.9 % IV SOLN: Freq: Once | INTRAVENOUS | Status: AC

## 2024-08-02 MED ORDER — FAMOTIDINE IN NACL 20-0.9 MG/50ML-% IV SOLN
20.0000 mg | Freq: Once | INTRAVENOUS | Status: AC
  Administered 2024-08-02: 20 mg via INTRAVENOUS
  Filled 2024-08-02: qty 50

## 2024-08-02 NOTE — Progress Notes (Signed)
 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 male was transferred to my care after his prior physician has left.   ASSESSMENT & PLAN:   Assessment & Plan: Travis Wood  is a 73 y.o. 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  - Tolerating rituximab  infusions well.  C6D1 today - We reviewed the CT scan findings in detail.  There is no evidence of recurrence of disease.  Will repeat scan in 6 months. - Reviewed labs today: CMP: WNL, CBC: WBC: 2.7, ANC: 1600, hemoglobin: 13.6, platelets: 117. - Proceed with chemotherapy today physical examination stable  Return to clinic in 2 months for next infusion and follow-up    The patient understands the plans discussed today and is in agreement with them.  He knows to contact our office if he develops concerns prior to his next appointment.  60 minutes of total time was spent for this patient encounter, including preparation,review of records,  face-to-face counseling with the patient and coordination of care, physical exam, and documentation of the encounter.    LILLETTE Verneta SAUNDERS Teague,acting as a Neurosurgeon for Mickiel Dry, MD.,have documented all relevant documentation on the behalf of Mickiel Dry, MD,as directed by  Mickiel Dry, MD while in the presence of Mickiel Dry, MD.  I, Mickiel Dry MD, have reviewed the above documentation for accuracy and completeness, and I agree with the above.    Mickiel Dry, MD  [facility]  CTR [place] - A DEPT OF [facility]OL[facility] HUN[facility] [address] [place] [address] Dept: [phone] Dept Fax: [phone]   No orders of the defined types were placed in this encounter.    ONCOLOGY HISTORY:   I have reviewed his chart and materials related to his cancer extensively and collaborated history with the patient. 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 Abnormal Cells in gated population: 76 % (total lymphocyte count is 7.7 K/ul). Phenotype of Abnormal Cells: CD5, CD19, CD20, CD21, CD22, CD23, HLA-DR, Lambda. -CLL FISH panel:  72% of nuclei positive for hemizygous and homozygous 13q. deletion with T p53  and ATM mutation negative. IgHV hyper mutation present. -12/12/2022: CT chest: Large right hilar mass or bulky lymphadenopathy, difficult to clearly distinguish from adjacent structures and lymph nodes although measuring at least 7.5 x 6.4 cm and encasing the right mainstem bronchus and lobar branches. Additional mediastinal lymphadenopathy, as well as axillary and lower cervical lymphadenopathy. -12/12/2022: CT AP: Severe splenomegaly, maximum coronal span 21.5 cm. Prominent retroperitoneal lymph nodes. Subcapsular hypodense lesion of the peripheral liver dome, hepatic segment VIII measuring 1.6 x 1.1 cm. Additional subcentimeter hypodense lesions, too small to characterize. -12/29/2022: Initial labs. Immunoglobulins: IgG 289, IgA 23, IgM 13, IgE <2. Haptoglobin 26. Reticulocytes normal. LDH normal. Beta-2  Microglobulin 4.6. Hepatitis B and Hepatitis C negative.  -01/08/2023: PET: Hypermetabolic adenopathy above and  below the diaphragm with splenomegaly, compatible with patient's known lymphocytic leukemia. -01/28/2023: Bronchoscopy and right  hilar mass biopsy.  Pathology: 87% of the cells are CD5 positive lambda restricted B cells coexpressing +/- CD38 and CD200. FISH for t(11:14) positive indicating mantle cell lymphoma.  -03/22/2023-07/21/2023: 6 cycles of Bendamustine  and rituximab  completed -08/27/2023: PET: No findings for recurrent lymphoma involving the neck, chest, abdomen/pelvis or bony structures. Interval decrease in size of the spleen. -09/30/2023-current: Maintenance rituximab  -07/26/2024: CT CAP: No evidence of recurrent lymphoma.   Current Treatment:  Maintenance rituximab   INTERVAL HISTORY:   Travis Wood is here today for follow up. Patient is accompanied by son today.  .  Patient has no infusion related complaints.No current symptoms such as fatigue. He maintains a good appetite and remains active, playing golf three times a week and engaging in activities like weed eating and mowing, indicating a good quality of life.Denies any infections in the past 2 months. Denies fevers, night sweats or weight loss.   He lives independently and monitors his health closely.  I have reviewed the past medical history, past surgical history, social history and family history with the patient and they are unchanged from previous note.  ALLERGIES:  has no known allergies.  MEDICATIONS:  Current Outpatient Medications  Medication Sig Dispense Refill   antiseptic oral rinse (BIOTENE) LIQD 15 mLs by Mouth Rinse route as needed for dry mouth.     atorvastatin  (LIPITOR) 40 MG tablet Take 1 tablet (40 mg total) by mouth daily. 90 tablet 3   ferrous sulfate  325 (65 FE) MG tablet Take 325 mg by mouth daily with breakfast.     Multiple Vitamin (MULTIVITAMIN WITH MINERALS) TABS tablet Take 1 tablet by mouth daily.     OLIVE LEAF EXTRACT PO Take 1 tablet by mouth in the morning and at bedtime.     OVER THE COUNTER MEDICATION Take 1 each by mouth 5 (five) times daily. [name]     riTUXimab  (RITUXAN  IV) Inject into the vein every  28 (twenty-eight) days.     Vitamin D , Ergocalciferol , (DRISDOL ) 1.25 MG (50000 UNIT) CAPS capsule Take 1 capsule (50,000 Units total) by mouth every 7 (seven) days. 13 capsule 3   No current facility-administered medications for this visit.   Facility-Administered Medications Ordered in Other Visits  Medication Dose Route Frequency Provider Last Rate Last Admin   heparin  lock flush 100 unit/mL  500 Units Intracatheter Once PRN Tressa Maldonado, MD       riTUXimab  (RITUXAN ) 900 mg in sodium chloride  0.9 % 160 mL infusion  375 mg/m2 (Treatment Plan Recorded) Intravenous Once Arline Ketter, MD       sodium chloride  flush (NS) 0.9 % injection 10 mL  10 mL Intracatheter PRN Gearldene Fiorenza, MD        REVIEW OF SYSTEMS:   Constitutional: Denies fevers, chills or abnormal weight loss Eyes: Denies blurriness of vision Ears, nose, mouth, throat, and face: Denies mucositis or sore throat Respiratory: Denies cough, dyspnea or wheezes Cardiovascular: Denies palpitation, chest discomfort or lower extremity swelling Gastrointestinal:  Denies nausea, heartburn or change in bowel habits Skin: Denies abnormal skin rashes Lymphatics: Denies new lymphadenopathy or easy bruising Neurological:Denies numbness, tingling or new weaknesses Behavioral/Psych: Mood is stable, no new changes  All other systems were reviewed with the patient and are negative.   VITALS:  Blood pressure 131/64, pulse (!) 50, temperature (!) 96.4 F (35.8 C), temperature source Tympanic, resp. rate 18, weight 225 lb 1.4  oz (102.1 kg), SpO2 100%.  Wt Readings from Last 3 Encounters:  08/02/24 224 lb (101.6 kg)  08/02/24 225 lb 1.4 oz (102.1 kg)  06/14/24 224 lb (101.6 kg)    Body mass index is 28.13 kg/m.  Performance status (ECOG): 1 - Symptomatic but completely ambulatory  PHYSICAL EXAM:   GENERAL:alert, no distress and comfortable SKIN: skin color, texture, turgor are normal, no rashes or significant lesions LYMPH:   no palpable lymphadenopathy in the cervical, axillary or inguinal LUNGS: clear to auscultation and percussion with normal breathing effort HEART: regular rate & rhythm and no murmurs and no lower extremity edema ABDOMEN:abdomen soft, non-tender and normal bowel sounds Musculoskeletal:no cyanosis of digits and no clubbing  NEURO: alert & oriented x 3 with fluent speech, no focal motor/sensory deficits  LABORATORY DATA:  I have reviewed the data as listed  Lab Results  Component Value Date   WBC 2.7 (L) 08/02/2024   NEUTROABS 1.6 (L) 08/02/2024   HGB 13.6 08/02/2024   HCT 40.2 08/02/2024   MCV 92.2 08/02/2024   PLT 117 (L) 08/02/2024      Chemistry      Component Value Date/Time   NA 140 08/02/2024 0844   NA 142 04/11/2024 1408   K 4.2 08/02/2024 0844   CL 101 08/02/2024 0844   CO2 26 08/02/2024 0844   BUN 17 08/02/2024 0844   BUN 15 04/11/2024 1408   CREATININE 0.76 08/02/2024 0844      Component Value Date/Time   CALCIUM  8.9 08/02/2024 0844   ALKPHOS 84 08/02/2024 0844   AST 21 08/02/2024 0844   ALT 19 08/02/2024 0844   BILITOT 0.9 08/02/2024 0844   BILITOT 0.3 04/11/2024 1408       RADIOGRAPHIC STUDIES: I have personally reviewed the radiological images as listed and agreed with the findings in the report.  CT CHEST ABDOMEN PELVIS W CONTRAST CLINICAL DATA:  Mantle cell lymphoma.  * Tracking Code: BO *  EXAM: CT CHEST, ABDOMEN, AND PELVIS WITH CONTRAST  TECHNIQUE: Multidetector CT imaging of the chest, abdomen and pelvis was performed following the standard protocol during bolus administration of intravenous contrast.  RADIATION DOSE REDUCTION: This exam was performed according to the departmental dose-optimization program which includes automated exposure control, adjustment of the mA and/or kV according to patient size and/or use of iterative reconstruction technique.  CONTRAST:  OMNIPAQUE  IOHEXOL  300 MG/ML  SOLN  COMPARISON:  01/22/2024 and PET  08/27/2023.  FINDINGS: CT CHEST FINDINGS  Cardiovascular: Right IJ Port-A-Cath terminates in the low SVC. There is mixing artifact in the SVC. Atherosclerotic calcification of the aorta, aortic valve and coronary arteries. Heart size normal. No pericardial effusion.  Mediastinum/Nodes: No pathologically enlarged mediastinal lymph nodes. Right hilar adenopathy measures up to 2.2 cm, stable. No left hilar or axillary adenopathy. Esophagus is grossly unremarkable.  Lungs/Pleura: Calcified granulomas. No suspicious pulmonary nodules. No pleural fluid. Airway is unremarkable.  Musculoskeletal: Degenerative changes in the spine.  CT ABDOMEN PELVIS FINDINGS  Hepatobiliary: Subcentimeter low-attenuation lesions in the liver, too small to characterize, stable. Liver and gallbladder are otherwise unremarkable. No biliary ductal dilatation.  Pancreas: Negative.  Spleen: Enlarged, 15.4 cm.  Adrenals/Urinary Tract: Adrenal glands and kidneys are unremarkable. Ureters are decompressed. Bladder is grossly unremarkable.  Stomach/Bowel: Stomach, small bowel and colon are unremarkable. Appendix is not readily visualized.  Vascular/Lymphatic: Retroaortic left renal vein. Atherosclerotic calcification of the aorta. Scattered lymph nodes are not enlarged by CT size criteria and are unchanged from 01/22/2024.  Reproductive: Prostate is normal in size.  Other: No free fluid.  Mesenteries and peritoneum are unremarkable.  Musculoskeletal: Degenerative changes in the spine.  IMPRESSION: 1. No evidence of recurrent lymphoma. 2. Stable right hilar adenopathy, not hypermetabolic on 08/27/2023. 3. Splenomegaly. 4. Aortic atherosclerosis (ICD10-I70.0). Coronary artery calcification.  Electronically Signed   By: Newell Eke M.D.   On: 07/29/2024 10:03

## 2024-08-02 NOTE — Progress Notes (Signed)
 Patient has been examined by Dr. Davonna. Vital signs and labs have been reviewed by MD - ANC, Creatinine, LFTs, hemoglobin, and platelets have been reviewed by M.D. - pt may proceed with treatment.  Primary RN and pharmacy notified.

## 2024-08-02 NOTE — Assessment & Plan Note (Addendum)
 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  - Tolerating rituximab  infusions well.  C6D1 today - We reviewed the CT scan findings in detail.  There is no evidence of recurrence of disease.  Will repeat scan in 6 months. - Reviewed labs today: CMP: WNL, CBC: WBC: 2.7, ANC: 1600, hemoglobin: 13.6, platelets: 117. - Proceed with chemotherapy today physical examination stable  Return to clinic in 2 months for next infusion and follow-up

## 2024-08-02 NOTE — Progress Notes (Signed)
 Patient presents today fro Rituxan  per providers order.  Vital signs and labs reviewed by MD.  Message received from Alliosn Anderson RN/Dr. Davonna patient okay for treatment.  Treatment given today per MD orders.  Stable during infusion without adverse affects.  Vital signs stable.  No complaints at this time.  Discharge from clinic ambulatory in stable condition.  Alert and oriented X 3.  Follow up with [facility] as scheduled.

## 2024-08-02 NOTE — Patient Instructions (Signed)
 [facility] [place] - A DEPT OF [facility]  Discharge Instructions: Thank you for choosing [facility] to provide your oncology and hematology care.  If you have a lab appointment with the Cancer Center - please note that after April 8th, 2024, all labs will be drawn in the cancer center.  You do not have to check in or register with the main entrance as you have in the past but will complete your check-in in the cancer center.  Wear comfortable clothing and clothing appropriate for easy access to any Portacath or PICC line.   We strive to give you quality time with your provider. You may need to reschedule your appointment if you arrive late (15 or more minutes).  Arriving late affects you and other patients whose appointments are after yours.  Also, if you miss three or more appointments without notifying the office, you may be dismissed from the clinic at the provider's discretion.      For prescription refill requests, have your pharmacy contact our office and allow 72 hours for refills to be completed.    Today you received the following chemotherapy and/or immunotherapy agents Rituxan      To help prevent nausea and vomiting after your treatment, we encourage you to take your nausea medication as directed.  BELOW ARE SYMPTOMS THAT SHOULD BE REPORTED IMMEDIATELY: *FEVER GREATER THAN 100.4 F (38 C) OR HIGHER *CHILLS OR SWEATING *NAUSEA AND VOMITING THAT IS NOT CONTROLLED WITH YOUR NAUSEA MEDICATION *UNUSUAL SHORTNESS OF BREATH *UNUSUAL BRUISING OR BLEEDING *URINARY PROBLEMS (pain or burning when urinating, or frequent urination) *BOWEL PROBLEMS (unusual diarrhea, constipation, pain near the anus) TENDERNESS IN MOUTH AND THROAT WITH OR WITHOUT PRESENCE OF ULCERS (sore throat, sores in mouth, or a toothache) UNUSUAL RASH, SWELLING OR PAIN  UNUSUAL VAGINAL DISCHARGE OR ITCHING   Items with * indicate a potential emergency and should be followed up  as soon as possible or go to the Emergency Department if any problems should occur.  Please show the CHEMOTHERAPY ALERT CARD or IMMUNOTHERAPY ALERT CARD at check-in to the Emergency Department and triage nurse.  Should you have questions after your visit or need to cancel or reschedule your appointment, please contact [facility] [place] - A DEPT OF [facility] [phone]  and follow the prompts.  Office hours are 8:00 a.m. to 4:30 p.m. Monday - Friday. Please note that voicemails left after 4:00 p.m. may not be returned until the following business day.  We are closed weekends and major holidays. You have access to a nurse at all times for urgent questions. Please call the main number to the clinic [phone] and follow the prompts.  For any non-urgent questions, you may also contact your provider using MyChart. We now offer e-Visits for anyone 77 and older to request care online for non-urgent symptoms. For details visit [email or web address].   Also download the MyChart app! Go to the app store, search "MyChart", open the app, select [facility], and log in with your MyChart username and password.

## 2024-08-02 NOTE — Patient Instructions (Signed)
 [facility] at [facility] Discharge Instructions   You were seen and examined today by Dr. Davonna.  She reviewed the results of your lab work which are normal/stable.   She reviewed the results of your CT scan which did not show any evidence of lymphoma.   We will proceed with your treatment today.   Return as scheduled.    Thank you for choosing [facility] at [facility] to provide your oncology and hematology care.  To afford each patient quality time with our provider, please arrive at least 15 minutes before your scheduled appointment time.   If you have a lab appointment with the Cancer Center please come in thru the Main Entrance and check in at the main information desk.  You need to re-schedule your appointment should you arrive 10 or more minutes late.  We strive to give you quality time with our providers, and arriving late affects you and other patients whose appointments are after yours.  Also, if you no show three or more times for appointments you may be dismissed from the clinic at the providers discretion.     Again, thank you for choosing [facility].  Our hope is that these requests will decrease the amount of time that you wait before being seen by our physicians.       _____________________________________________________________  Should you have questions after your visit to [facility], please contact our office at [phone] and follow the prompts.  Our office hours are 8:00 a.m. and 4:30 p.m. Monday - Friday.  Please note that voicemails left after 4:00 p.m. may not be returned until the following business day.  We are closed weekends and major holidays.  You do have access to a nurse 24-7, just call the main number to the clinic [phone] and do not press any options, hold on the line and a nurse will answer the phone.    For prescription refill requests, have your pharmacy contact our  office and allow 72 hours.    Due to Covid, you will need to wear a mask upon entering the hospital. If you do not have a mask, a mask will be given to you at the Main Entrance upon arrival. For doctor visits, patients may have 1 support person age 10 or older with them. For treatment visits, patients can not have anyone with them due to social distancing guidelines and our immunocompromised population.

## 2024-08-03 ENCOUNTER — Other Ambulatory Visit: Payer: Self-pay

## 2024-08-24 ENCOUNTER — Other Ambulatory Visit: Payer: Self-pay

## 2024-08-26 ENCOUNTER — Other Ambulatory Visit: Payer: Self-pay

## 2024-08-30 ENCOUNTER — Other Ambulatory Visit: Payer: Self-pay

## 2024-10-05 ENCOUNTER — Other Ambulatory Visit: Payer: Self-pay | Admitting: Oncology

## 2024-10-05 DIAGNOSIS — C8318 Mantle cell lymphoma, lymph nodes of multiple sites: Secondary | ICD-10-CM
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
## 2024-10-10 ENCOUNTER — Inpatient Hospital Stay: Attending: Hematology

## 2024-10-10 ENCOUNTER — Inpatient Hospital Stay: Attending: Hematology | Admitting: Oncology

## 2024-10-10 ENCOUNTER — Encounter: Payer: Self-pay | Admitting: Oncology

## 2024-10-10 ENCOUNTER — Inpatient Hospital Stay

## 2024-10-10 VITALS — BP 121/78 | HR 58 | Temp 96.4°F | Resp 18

## 2024-10-10 DIAGNOSIS — D702 Other drug-induced agranulocytosis: Secondary | ICD-10-CM | POA: Diagnosis not present

## 2024-10-10 DIAGNOSIS — C8318 Mantle cell lymphoma, lymph nodes of multiple sites: Secondary | ICD-10-CM

## 2024-10-10 DIAGNOSIS — D696 Thrombocytopenia, unspecified: Secondary | ICD-10-CM | POA: Diagnosis not present

## 2024-10-10 DIAGNOSIS — Z79620 Long term (current) use of immunosuppressive biologic: Secondary | ICD-10-CM | POA: Diagnosis not present

## 2024-10-10 DIAGNOSIS — Z5112 Encounter for antineoplastic immunotherapy: Secondary | ICD-10-CM | POA: Insufficient documentation

## 2024-10-10 LAB — CBC WITH DIFFERENTIAL/PLATELET
Abs Immature Granulocytes: 0.02 K/uL (ref 0.00–0.07)
Basophils Absolute: 0 K/uL (ref 0.0–0.1)
Basophils Relative: 1 %
Eosinophils Absolute: 0.1 K/uL (ref 0.0–0.5)
Eosinophils Relative: 2 %
HCT: 40.3 % (ref 39.0–52.0)
Hemoglobin: 13.8 g/dL (ref 13.0–17.0)
Immature Granulocytes: 1 %
Lymphocytes Relative: 26 %
Lymphs Abs: 0.7 K/uL (ref 0.7–4.0)
MCH: 31.4 pg (ref 26.0–34.0)
MCHC: 34.2 g/dL (ref 30.0–36.0)
MCV: 91.8 fL (ref 80.0–100.0)
Monocytes Absolute: 0.3 K/uL (ref 0.1–1.0)
Monocytes Relative: 12 %
Neutro Abs: 1.5 K/uL — ABNORMAL LOW (ref 1.7–7.7)
Neutrophils Relative %: 58 %
Platelets: 119 K/uL — ABNORMAL LOW (ref 150–400)
RBC: 4.39 MIL/uL (ref 4.22–5.81)
RDW: 12.2 % (ref 11.5–15.5)
WBC: 2.5 K/uL — ABNORMAL LOW (ref 4.0–10.5)
nRBC: 0 % (ref 0.0–0.2)

## 2024-10-10 LAB — COMPREHENSIVE METABOLIC PANEL WITH GFR
ALT: 20 U/L (ref 0–44)
AST: 27 U/L (ref 15–41)
Albumin: 4.4 g/dL (ref 3.5–5.0)
Alkaline Phosphatase: 98 U/L (ref 38–126)
Anion gap: 9 (ref 5–15)
BUN: 11 mg/dL (ref 8–23)
CO2: 28 mmol/L (ref 22–32)
Calcium: 8.8 mg/dL — ABNORMAL LOW (ref 8.9–10.3)
Chloride: 105 mmol/L (ref 98–111)
Creatinine, Ser: 0.76 mg/dL (ref 0.61–1.24)
GFR, Estimated: >60 mL/min (ref >60)
Glucose, Bld: 94 mg/dL (ref 70–99)
Potassium: 4.3 mmol/L (ref 3.5–5.1)
Sodium: 142 mmol/L (ref 135–145)
Total Bilirubin: 0.4 mg/dL (ref 0.0–1.2)
Total Protein: 6.1 g/dL — ABNORMAL LOW (ref 6.5–8.1)

## 2024-10-10 LAB — MAGNESIUM: Magnesium: 2.4 mg/dL (ref 1.7–2.4)

## 2024-10-10 LAB — LACTATE DEHYDROGENASE: LDH: 328 U/L — ABNORMAL HIGH (ref 98–192)

## 2024-10-10 MED ORDER — CETIRIZINE HCL 10 MG/ML IV SOLN
10.0000 mg | Freq: Once | INTRAVENOUS | Status: AC
  Administered 2024-10-10: 10 mg via INTRAVENOUS
  Filled 2024-10-10: qty 1

## 2024-10-10 MED ORDER — SODIUM CHLORIDE 0.9 % IV SOLN
375.0000 mg/m2 | Freq: Once | INTRAVENOUS | Status: AC
  Administered 2024-10-10: 900 mg via INTRAVENOUS
  Filled 2024-10-10: qty 50

## 2024-10-10 MED ORDER — SODIUM CHLORIDE 0.9 % IV SOLN: Freq: Once | INTRAVENOUS | Status: AC

## 2024-10-10 MED ORDER — ACETAMINOPHEN 325 MG PO TABS
650.0000 mg | ORAL_TABLET | Freq: Once | ORAL | Status: AC
  Administered 2024-10-10: 650 mg via ORAL
  Filled 2024-10-10: qty 2

## 2024-10-10 MED ORDER — FAMOTIDINE IN NACL 20-0.9 MG/50ML-% IV SOLN
20.0000 mg | Freq: Once | INTRAVENOUS | Status: AC
  Administered 2024-10-10: 20 mg via INTRAVENOUS
  Filled 2024-10-10: qty 50

## 2024-10-10 NOTE — Progress Notes (Signed)
 Patient has been examined by Dr. Davonna. Vital signs and labs have been reviewed by MD - ANC, Creatinine, LFTs, hemoglobin, and platelets have been reviewed by M.D. - pt may proceed with treatment.  Primary RN and pharmacy notified.

## 2024-10-10 NOTE — Patient Instructions (Signed)
 [facility] [place] - A DEPT OF [facility]  Discharge Instructions: Thank you for choosing [facility] to provide your oncology and hematology care.  If you have a lab appointment with the Cancer Center - please note that after April 8th, 2024, all labs will be drawn in the cancer center.  You do not have to check in or register with the main entrance as you have in the past but will complete your check-in in the cancer center.  Wear comfortable clothing and clothing appropriate for easy access to any Portacath or PICC line.   We strive to give you quality time with your provider. You may need to reschedule your appointment if you arrive late (15 or more minutes).  Arriving late affects you and other patients whose appointments are after yours.  Also, if you miss three or more appointments without notifying the office, you may be dismissed from the clinic at the provider's discretion.      For prescription refill requests, have your pharmacy contact our office and allow 72 hours for refills to be completed.    Today you received the following chemotherapy and/or immunotherapy agents rituxan.        To help prevent nausea and vomiting after your treatment, we encourage you to take your nausea medication as directed.  BELOW ARE SYMPTOMS THAT SHOULD BE REPORTED IMMEDIATELY: *FEVER GREATER THAN 100.4 F (38 C) OR HIGHER *CHILLS OR SWEATING *NAUSEA AND VOMITING THAT IS NOT CONTROLLED WITH YOUR NAUSEA MEDICATION *UNUSUAL SHORTNESS OF BREATH *UNUSUAL BRUISING OR BLEEDING *URINARY PROBLEMS (pain or burning when urinating, or frequent urination) *BOWEL PROBLEMS (unusual diarrhea, constipation, pain near the anus) TENDERNESS IN MOUTH AND THROAT WITH OR WITHOUT PRESENCE OF ULCERS (sore throat, sores in mouth, or a toothache) UNUSUAL RASH, SWELLING OR PAIN  UNUSUAL VAGINAL DISCHARGE OR ITCHING   Items with * indicate a potential emergency and should be followed  up as soon as possible or go to the Emergency Department if any problems should occur.  Please show the CHEMOTHERAPY ALERT CARD or IMMUNOTHERAPY ALERT CARD at check-in to the Emergency Department and triage nurse.  Should you have questions after your visit or need to cancel or reschedule your appointment, please contact [facility] [place] - A DEPT OF [facility] [phone]  and follow the prompts.  Office hours are 8:00 a.m. to 4:30 p.m. Monday - Friday. Please note that voicemails left after 4:00 p.m. may not be returned until the following business day.  We are closed weekends and major holidays. You have access to a nurse at all times for urgent questions. Please call the main number to the clinic [phone] and follow the prompts.  For any non-urgent questions, you may also contact your provider using MyChart. We now offer e-Visits for anyone 73 and older to request care online for non-urgent symptoms. For details visit [email or web address].   Also download the MyChart app! Go to the app store, search "MyChart", open the app, select [place], and log in with your MyChart username and password.

## 2024-10-10 NOTE — Progress Notes (Signed)
 Patient tolerated therapy with no complaints voiced.  Side effects with management reviewed with understanding verbalized.  Port site clean and dry with no bruising or swelling noted at site.  Good blood return noted before and after administration of therapy.  Band aid applied.  Patient left in satisfactory condition with VSS and no s/s of distress noted.

## 2024-10-10 NOTE — Patient Instructions (Signed)

## 2024-10-12 ENCOUNTER — Other Ambulatory Visit: Payer: Self-pay

## 2024-10-26 ENCOUNTER — Other Ambulatory Visit: Payer: Self-pay

## 2024-11-04 ENCOUNTER — Ambulatory Visit: Payer: Self-pay

## 2024-11-04 VITALS — BP 131/64 | HR 50 | Ht 75.0 in | Wt 225.0 lb

## 2024-11-04 DIAGNOSIS — Z0000 Encounter for general adult medical examination without abnormal findings: Secondary | ICD-10-CM

## 2024-11-04 NOTE — Progress Notes (Signed)
 Chief Complaint  Patient presents with   Medicare Wellness     Subjective:   Travis Wood is a 73 y.o. male who presents for a Medicare Annual Wellness Visit.  Visit info / Clinical Intake: Medicare Wellness Visit Type:: Subsequent Annual Wellness Visit Persons participating in visit and providing information:: patient Medicare Wellness Visit Mode:: Telephone If telephone:: video declined Since this visit was completed virtually, some vitals may be partially provided or unavailable. Missing vitals are due to the limitations of the virtual format.: Documented vitals are patient reported If Telephone or Video please confirm:: I connected with patient using audio/video enable telemedicine. I verified patient identity with two identifiers, discussed telehealth limitations, and patient agreed to proceed. Patient Location:: home Provider Location:: home office Interpreter Needed?: No Pre-visit prep was completed: yes AWV questionnaire completed by patient prior to visit?: no Living arrangements:: (!) lives alone Patient's Overall Health Status Rating: very good Typical amount of pain: none Does pain affect daily life?: no Are you currently prescribed opioids?: no  Dietary Habits and Nutritional Risks How many meals a day?: 2 Eats fruit and vegetables daily?: yes Most meals are obtained by: preparing own meals In the last 2 weeks, have you had any of the following?: none Diabetic:: no  Functional Status Activities of Daily Living (to include ambulation/medication): Independent Ambulation: Independent Medication Administration: Independent Home Management (perform basic housework or laundry): Independent Manage your own finances?: yes Primary transportation is: driving Concerns about vision?: no *vision screening is required for [facility]* Concerns about hearing?: no  Fall Screening Falls in the past year?: 0 Number of falls in past year: 0 Was there an injury with Fall?: 0 Fall  Risk Category Calculator: 0 Patient Fall Risk Level: Low Fall Risk  Fall Risk Patient at Risk for Falls Due to: No Fall Risks Fall risk Follow up: Falls evaluation completed; Education provided  Home and Transportation Safety: All rugs have non-skid backing?: yes All stairs or steps have railings?: yes Grab bars in the bathtub or shower?: (!) no Have non-skid surface in bathtub or shower?: yes Good home lighting?: yes Regular seat belt use?: yes Hospital stays in the last year:: no  Cognitive Assessment Difficulty concentrating, remembering, or making decisions? : no Will 6CIT or Mini Cog be Completed: yes What year is it?: 0 points What month is it?: 0 points Give patient an address phrase to remember (5 components): [address] [place] About what time is it?: 0 points Count backwards from 20 to 1: 0 points Say the months of the year in reverse: 0 points Repeat the address phrase from earlier: 0 points 6 CIT Score: 0 points  Advance Directives (For Healthcare) Does Patient Have a Medical Advance Directive?: No Does patient want to make changes to medical advance directive?: No - Patient declined Type of Advance Directive: Healthcare Power of [place]; Living will Copy of Healthcare Power of Attorney in Chart?: No - copy requested Copy of Living Will in Chart?: No - copy requested Would patient like information on creating a medical advance directive?: No - Patient declined  Reviewed/Updated  Reviewed/Updated: Reviewed All (Medical, Surgical, Family, Medications, Allergies, Care Teams, Patient Goals); Medical History; Surgical History; Family History; Medications; Allergies; Care Teams; Patient Goals    Allergies (verified) Patient has no known allergies.   Current Medications (verified) Outpatient Encounter Medications as of 11/04/2024  Medication Sig   amoxicillin  (AMOXIL ) 500 MG capsule Take by mouth.   antiseptic oral rinse (BIOTENE) LIQD 15  mLs by  Mouth Rinse route as needed for dry mouth.   atorvastatin  (LIPITOR) 40 MG tablet Take 1 tablet (40 mg total) by mouth daily.   ferrous sulfate  325 (65 FE) MG tablet Take 325 mg by mouth daily with breakfast.   ibuprofen (ADVIL) 800 MG tablet Take by mouth.   Multiple Vitamin (MULTIVITAMIN WITH MINERALS) TABS tablet Take 1 tablet by mouth daily.   OLIVE LEAF EXTRACT PO Take 1 tablet by mouth in the morning and at bedtime.   OVER THE COUNTER MEDICATION Take 1 each by mouth 5 (five) times daily. [name]   riTUXimab  (RITUXAN  IV) Inject into the vein every 28 (twenty-eight) days.   Vitamin D , Ergocalciferol , (DRISDOL ) 1.25 MG (50000 UNIT) CAPS capsule Take 1 capsule (50,000 Units total) by mouth every 7 (seven) days.   No facility-administered encounter medications on file as of 11/04/2024.    History: Past Medical History:  Diagnosis Date   Anemia    Cataract    CLL (chronic lymphocytic leukemia) (HCC)    Erectile dysfunction    History of bronchitis    History of colon polyps    Hyperlipemia    Osteoarthritis    Shortness of breath dyspnea    with exertion   Past Surgical History:  Procedure Laterality Date   APPENDECTOMY     BRONCHIAL NEEDLE ASPIRATION BIOPSY  01/28/2023   Procedure: BRONCHIAL NEEDLE ASPIRATION BIOPSIES;  Surgeon: Brenna Adine CROME, DO;  [facility]: [facility];  Service: Cardiopulmonary;;   CATARACT EXTRACTION W/PHACO Left 10/15/2015   Procedure: CATARACT EXTRACTION PHACO AND INTRAOCULAR LENS PLACEMENT LEFT EYE;  Surgeon: Cherene Mania, MD;  Location: [facility];  Service: Ophthalmology;  Laterality: Left;  CDE:12.20   CATARACT EXTRACTION W/PHACO Right 11/12/2015   Procedure: CATARACT EXTRACTION PHACO AND INTRAOCULAR LENS PLACEMENT RIGHT EYE:  CDE:  8.99;  Surgeon: Cherene Mania, MD;  Location: [facility];  Service: Ophthalmology;  Laterality: Right;   COLONOSCOPY N/A 06/14/2024   Procedure: COLONOSCOPY;  Surgeon: Cindie Carlin POUR, DO;  Location: [facility];  Service:  Endoscopy;  Laterality: N/A;  1:15pm, ok rm 1-2   IR IMAGING GUIDED PORT INSERTION  03/04/2023   KNEE SURGERY     Left x 2    MASS EXCISION N/A 09/12/2015   Procedure: EXCISION SOFT TISSUE NEOPLASM (6 CM), BACK;  Surgeon: Oneil Budge, MD;  Location: [facility];  Service: General;  Laterality: N/A;   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   Procedure: TOTAL KNEE ARTHROPLASTY;  Surgeon: Ernie Cough, MD;  Location: [facility];  Service: Orthopedics;  Laterality: Right;  70 mins   VIDEO BRONCHOSCOPY WITH ENDOBRONCHIAL ULTRASOUND Bilateral 01/28/2023   Procedure: VIDEO BRONCHOSCOPY WITH ENDOBRONCHIAL ULTRASOUND;  Surgeon: Brenna Adine CROME, DO;  Location: [facility];  Service: Cardiopulmonary;  Laterality: Bilateral;   Family History  Problem Relation Age of Onset   Colon cancer Father        age 3    Colon polyps Father    Heart disease Father        Heart failure   Heart disease Mother    Cancer Sister        Uterine, and ovarian   Hypertension Brother    Obesity Sister    Heart disease Maternal Grandmother    Esophageal cancer Neg Hx    Liver cancer Neg Hx    Pancreatic cancer Neg Hx    Rectal cancer Neg Hx    Stomach cancer Neg Hx    Social History  Occupational History   Occupation: RETIRED  Tobacco Use   Smoking status: Former    Current packs/day: 0.00    Average packs/day: 1 pack/day for 10.0 years (10.0 ttl pk-yrs)    Types: Cigarettes, Pipe    Start date: 09/09/1981    Quit date: 09/10/1991    Years since quitting: 33.1   Smokeless tobacco: Never   Tobacco comments:    Smoked a pipe occasionally for 3 years.  Vaping Use   Vaping status: Never Used  Substance and Sexual Activity   Alcohol use: Not Currently    Alcohol/week: 1.0 standard drink of alcohol    Types: 1 Cans of beer per week   Drug use: Yes    Frequency: 1.0 times per week    Types: Marijuana   Sexual activity: Yes   Tobacco Counseling Counseling given: Yes Tobacco comments: Smoked a pipe  occasionally for 3 years.  SDOH Screenings   Food Insecurity: No Food Insecurity (11/04/2024)  Housing: Unknown (11/04/2024)  Transportation Needs: No Transportation Needs (04/20/2023)  Utilities: Not At Risk (04/20/2023)  Alcohol Screen: Low Risk  (09/20/2021)  Depression (PHQ2-9): Low Risk  (11/04/2024)  Financial Resource Strain: Low Risk  (11/06/2022)  Physical Activity: Sufficiently Active (11/04/2024)  Social Connections: Socially Integrated (11/04/2024)  Stress: No Stress Concern Present (11/04/2024)  Tobacco Use: Medium Risk (11/04/2024)  Health Literacy: Adequate Health Literacy (11/04/2024)   See flowsheets for full screening details  Depression Screen PHQ 2 & 9 Depression Scale- Over the past 2 weeks, how often have you been bothered by any of the following problems? Little interest or pleasure in doing things: 0 Feeling down, depressed, or hopeless (PHQ Adolescent also includes...irritable): 0 PHQ-2 Total Score: 0     Goals Addressed             This Visit's Progress    Patient Stated   On track    11/06/2022 AWV Goal: Fall Prevention  Over the next year, patient will decrease their risk for falls by: Using assistive devices, such as a cane or walker, as needed Identifying fall risks within their home and correcting them by: Removing throw rugs Adding handrails to stairs or ramps Removing clutter and keeping a clear pathway throughout the home Increasing light, especially at night Adding shower handles/bars Raising toilet seat Identifying potential personal risk factors for falls: Medication side effects Incontinence/urgency Vestibular dysfunction Hearing loss Musculoskeletal disorders Neurological disorders Orthostatic hypotension               Objective:    Today's Vitals   11/04/24 1335  BP: 131/64  Pulse: (!) 50  Weight: 225 lb (102.1 kg)  Height: 6' 3 (1.905 m)   Body mass index is 28.12 kg/m.  Hearing/Vision screen Hearing Screening -  Comments:: Pt denies hearing dif Vision Screening - Comments:: Last ov couple yrs. Sug eye update  Immunizations and Health Maintenance Health Maintenance  Topic Date Due   COVID-19 Vaccine (1) Never done   DTaP/Tdap/Td (2 - Td or Tdap) 04/20/2021   Zoster Vaccines- Shingrix  (2 of 2) 01/07/2023   Influenza Vaccine  07/01/2024   Medicare Annual Wellness (AWV)  11/04/2025   Colonoscopy  06/15/2027   Pneumococcal Vaccine: 50+ Years  Completed   Hepatitis C Screening  Completed   Meningococcal B Vaccine  Aged Out        Assessment/Plan:  This is a routine wellness examination for Jamiah.  Patient Care Team: Zollie Lowers, MD as PCP - General (Family Medicine) Celestia,  Joesph SQUIBB, RN as Oncology Nurse Navigator (Medical Oncology)  I have personally reviewed and noted the following in the patient's chart:   Medical and social history Use of alcohol, tobacco or illicit drugs  Current medications and supplements including opioid prescriptions. Functional ability and status Nutritional status Physical activity Advanced directives List of other physicians Hospitalizations, surgeries, and ER visits in previous 12 months Vitals Screenings to include cognitive, depression, and falls Referrals and appointments  No orders of the defined types were placed in this encounter.  In addition, I have reviewed and discussed with patient certain preventive protocols, quality metrics, and best practice recommendations. A written personalized care plan for preventive services as well as general preventive health recommendations were provided to patient.   Ozie Ned, CMA   11/04/2024   Return in 1 year (on 11/04/2025).  After Visit Summary: (MyChart) Due to this being a telephonic visit, the after visit summary with patients personalized plan was offered to patient via MyChart   Nurse Notes: pt is aware and due the following:Dtap, shingles, flu, covid vaccines--will get at next pcp/[facility]

## 2024-11-04 NOTE — Patient Instructions (Signed)
 [name],  Thank you for taking the time for your Medicare Wellness Visit. I appreciate your continued commitment to your health goals. Please review the care plan we discussed, and feel free to reach out if I can assist you further.  Please note that Annual Wellness Visits do not include a physical exam. Some assessments may be limited, especially if the visit was conducted virtually. If needed, we may recommend an in-person follow-up with your provider.  Ongoing Care Seeing your primary care provider every 3 to 6 months helps us  monitor your health and provide consistent, personalized care.   Referrals If a referral was made during today's visit and you haven't received any updates within two weeks, please contact the referred provider directly to check on the status.  Recommended Screenings:  Health Maintenance  Topic Date Due   COVID-19 Vaccine (1) Never done   DTaP/Tdap/Td vaccine (2 - Td or Tdap) 04/20/2021   Zoster (Shingles) Vaccine (2 of 2) 01/07/2023   Flu Shot  07/01/2024   Medicare Annual Wellness Visit  11/04/2025   Colon Cancer Screening  06/15/2027   Pneumococcal Vaccine for age over 49  Completed   Hepatitis C Screening  Completed   Meningitis B Vaccine  Aged Out       11/04/2024    1:37 PM  Advanced Directives  Does Patient Have a Medical Advance Directive? No  Would patient like information on creating a medical advance directive? No - Patient declined    Vision: Annual vision screenings are recommended for early detection of glaucoma, cataracts, and diabetic retinopathy. These exams can also reveal signs of chronic conditions such as diabetes and high blood pressure.  Dental: Annual dental screenings help detect early signs of oral cancer, gum disease, and other conditions linked to overall health, including heart disease and diabetes.  Please see the attached documents for additional preventive care recommendations.

## 2024-11-11 ENCOUNTER — Other Ambulatory Visit: Payer: Self-pay | Admitting: *Deleted

## 2024-11-11 DIAGNOSIS — E782 Mixed hyperlipidemia: Secondary | ICD-10-CM

## 2024-11-11 DIAGNOSIS — E559 Vitamin D deficiency, unspecified: Secondary | ICD-10-CM

## 2024-11-11 MED ORDER — ATORVASTATIN CALCIUM 40 MG PO TABS: 40.0000 mg | ORAL_TABLET | Freq: Every day | ORAL | 0 refills | Status: AC

## 2024-11-11 NOTE — Telephone Encounter (Signed)
 [facility] NTBS for 6 mos FU NO RF sent to mail order pharmacy

## 2024-11-11 NOTE — Addendum Note (Signed)
 Addended by: Ivar Domangue D on: 11/11/2024 10:15 AM   Modules accepted: Orders

## 2024-11-11 NOTE — Telephone Encounter (Signed)
 I called pt & made him an appt w/[facility] for med refill on 11-21-2024 at 1:55pm.

## 2024-11-12 MED ORDER — VITAMIN D (ERGOCALCIFEROL) 1.25 MG (50000 UNIT) PO CAPS: 50000.0000 [IU] | ORAL_CAPSULE | ORAL | 1 refills | Status: DC

## 2024-11-21 ENCOUNTER — Ambulatory Visit: Admitting: Family Medicine

## 2024-11-21 ENCOUNTER — Encounter: Payer: Self-pay | Admitting: Family Medicine

## 2024-11-21 VITALS — BP 97/62 | HR 70 | Temp 98.0°F | Ht 75.0 in | Wt 216.0 lb

## 2024-11-21 DIAGNOSIS — R918 Other nonspecific abnormal finding of lung field: Secondary | ICD-10-CM | POA: Diagnosis not present

## 2024-11-21 DIAGNOSIS — Z23 Encounter for immunization: Secondary | ICD-10-CM

## 2024-11-21 DIAGNOSIS — C8318 Mantle cell lymphoma, lymph nodes of multiple sites: Secondary | ICD-10-CM

## 2024-11-21 DIAGNOSIS — E559 Vitamin D deficiency, unspecified: Secondary | ICD-10-CM

## 2024-11-21 DIAGNOSIS — E782 Mixed hyperlipidemia: Secondary | ICD-10-CM | POA: Diagnosis not present

## 2024-11-21 MED ORDER — VITAMIN D (ERGOCALCIFEROL) 1.25 MG (50000 UNIT) PO CAPS: 50000.0000 [IU] | ORAL_CAPSULE | ORAL | 1 refills | Status: AC

## 2024-11-21 NOTE — Progress Notes (Signed)
 "  Subjective:  Patient ID: Travis Wood, male    DOB: [birth year]  Age: 73 y.o. MRN: [id number]  CC: Medical Management of Chronic Issues   HPI  Discussed the use of AI scribe software for clinical note transcription with the patient, who gave verbal consent to proceed.  History of Present Illness Travis Wood is a 73 year old male with chronic leukemia who presents for a follow-up visit.  He is currently on Rituxan  for cancer prevention, administered through a port. He is concerned about low protein levels noted in recent blood tests and is consuming Ensure protein shakes to address this, although he sometimes misses days. He also takes supplements for his immune system, including a product called 723 and olive leaf extract.  Chronic leukemia was diagnosed five years ago. His mother also had leukemia. He has a history of lung cancer, which is currently in remission, diagnosed a couple of years ago.  He underwent a right knee replacement, which is doing well. His left knee, which he considers his 'good knee,' is worse than the right one.  No stomach problems and no history of spleen removal. He has not received a flu shot this year but has received a shingles shot. He is concerned about his immune system and takes supplements to support it.  His grandbabies were diagnosed with the flu this morning. He expresses surprise about the flu, stating 'I thought flu was healed.'          11/04/2024    1:40 PM 10/10/2024   11:40 AM 10/10/2024   10:28 AM  Depression screen PHQ 2/9  Decreased Interest 0 0 0  Down, Depressed, Hopeless 0 0 0  PHQ - 2 Score 0 0 0    History Travis Wood has a past medical history of Anemia, Cataract, CLL (chronic lymphocytic leukemia) (HCC), Erectile dysfunction, History of bronchitis, History of colon polyps, Hyperlipemia, Osteoarthritis, and Shortness of breath dyspnea.   He has a past surgical history that includes Appendectomy; Knee surgery; Mass excision  (N/A, 09/12/2015); Cataract extraction w/PHACO (Left, 10/15/2015); Cataract extraction w/PHACO (Right, 11/12/2015); Total knee arthroplasty (Right, 10/09/2020); Video bronchoscopy with endobronchial ultrasound (Bilateral, 01/28/2023); Bronchial needle aspiration biopsy (01/28/2023); IR IMAGING GUIDED PORT INSERTION (03/04/2023); and Colonoscopy (N/A, 06/14/2024).   His family history includes Cancer in his sister; Colon cancer in his father; Colon polyps in his father; Heart disease in his father, maternal grandmother, and mother; Hypertension in his brother; Obesity in his sister.He reports that he quit smoking about 33 years ago. His smoking use included cigarettes and pipe. He started smoking about 43 years ago. He has a 10 pack-year smoking history. He has never used smokeless tobacco. He reports that he does not currently use alcohol after a past usage of about 1.0 standard drink of alcohol per week. He reports current drug use. Frequency: 1.00 time per week. Drug: Marijuana.    ROS Review of Systems  Constitutional: Negative.   HENT: Negative.    Eyes:  Negative for visual disturbance.  Respiratory:  Negative for cough and shortness of breath.   Cardiovascular:  Negative for chest pain and leg swelling.  Gastrointestinal:  Negative for abdominal pain, diarrhea, nausea and vomiting.  Genitourinary:  Negative for difficulty urinating.  Musculoskeletal:  Negative for arthralgias and myalgias.  Skin:  Negative for rash.  Neurological:  Negative for headaches.  Psychiatric/Behavioral:  Negative for sleep disturbance.     Objective:  BP 97/62   Pulse 70   Temp 98  F (36.7 C)   Ht 6' 3 (1.905 m)   Wt 216 lb (98 kg)   SpO2 96%   BMI 27.00 kg/m   BP Readings from Last 3 Encounters:  11/21/24 97/62  11/04/24 131/64  10/10/24 121/78    Wt Readings from Last 3 Encounters:  11/21/24 216 lb (98 kg)  11/04/24 225 lb (102.1 kg)  10/10/24 229 lb 0.9 oz (103.9 kg)     Physical  Exam Physical Exam GENERAL: Alert, cooperative, well developed, no acute distress HEENT: Normocephalic, normal oropharynx, moist mucous membranes CHEST: Clear to auscultation bilaterally, no wheezes, rhonchi, or crackles CARDIOVASCULAR: Normal heart rate and rhythm, S1 and S2 normal without murmurs ABDOMEN: Soft, non-tender, non-distended, without organomegaly, normal bowel sounds EXTREMITIES: No cyanosis or edema NEUROLOGICAL: Cranial nerves grossly intact, moves all extremities without gross motor or sensory deficit   Assessment & Plan:  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  Dispense: 13 capsule; Refill: 1 -     CMP14+EGFR  Right lower lobe lung mass -     CMP14+EGFR  Mixed hyperlipidemia -     Lipid panel  Immunization due -     Varicella-zoster vaccine IM    Assessment and Plan Assessment & Plan Mantle cell lymphoma   Currently in remission. Continue Rituxan  treatment through port.  History of lung cancer   Currently in remission.  Mixed hyperlipidemia   Cholesterol levels were last checked seven months ago. Ordered cholesterol test.  Benign prostatic hyperplasia with lower urinary tract symptoms   No new symptoms or complications reported.  General health maintenance   Protein levels are slightly low. Discussed dietary adjustments to increase protein intake. No spleen removal due to leukemia. No stomach problems reported. Discussed flu shot and immune system supplements. Continue protein shakes to increase protein intake. Ordered blood work including liver function tests. Schedule follow-up in six months for comprehensive blood work including prostate blood and blood counts.       Follow-up: Return in about 6 months (around 05/22/2025) for [facility] physical.  Butler Der, M.D. "

## 2024-11-22 ENCOUNTER — Encounter: Payer: Self-pay | Admitting: Oncology

## 2024-11-22 LAB — CMP14+EGFR
ALT: 13 IU/L (ref 0–44)
AST: 22 IU/L (ref 0–40)
Albumin: 4.2 g/dL (ref 3.8–4.8)
Alkaline Phosphatase: 121 IU/L (ref 47–123)
BUN/Creatinine Ratio: 17 (ref 10–24)
BUN: 12 mg/dL (ref 8–27)
Bilirubin Total: 0.5 mg/dL (ref 0.0–1.2)
CO2: 24 mmol/L (ref 20–29)
Calcium: 9.1 mg/dL (ref 8.6–10.2)
Chloride: 101 mmol/L (ref 96–106)
Creatinine, Ser: 0.71 mg/dL — ABNORMAL LOW (ref 0.76–1.27)
Globulin, Total: 1.5 g/dL (ref 1.5–4.5)
Glucose: 97 mg/dL (ref 70–99)
Potassium: 4.4 mmol/L (ref 3.5–5.2)
Sodium: 141 mmol/L (ref 134–144)
Total Protein: 5.7 g/dL — ABNORMAL LOW (ref 6.0–8.5)
eGFR: 97 mL/min/1.73

## 2024-11-22 LAB — LIPID PANEL
Chol/HDL Ratio: 3.2 ratio (ref 0.0–5.0)
Cholesterol, Total: 120 mg/dL (ref 100–199)
HDL: 38 mg/dL — ABNORMAL LOW
LDL Chol Calc (NIH): 65 mg/dL (ref 0–99)
Triglycerides: 84 mg/dL (ref 0–149)
VLDL Cholesterol Cal: 17 mg/dL (ref 5–40)

## 2024-11-25 ENCOUNTER — Other Ambulatory Visit: Payer: Self-pay

## 2024-11-27 ENCOUNTER — Ambulatory Visit: Payer: Self-pay | Admitting: Family Medicine

## 2024-11-27 NOTE — Progress Notes (Signed)
 Hello Jakaleb,  Your lab result is normal and/or stable.Some minor variations that are not significant are commonly marked abnormal, but do not represent any medical problem for you.  Best regards, Butler Der, M.D.

## 2024-11-28 ENCOUNTER — Encounter: Payer: Self-pay | Admitting: *Deleted

## 2024-12-05 ENCOUNTER — Encounter: Payer: Self-pay | Admitting: Oncology

## 2024-12-11 NOTE — Progress Notes (Signed)
 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   - Tolerating rituximab  infusions well.  C8D1 today -  Will repeat CT scan in 6 months. Will obtain prior to next office visit - Reviewed labs today: CMP: WNL, CBC: WBC: 3.9, ANC: 2800, hemoglobin: 13.0, platelets: 154. - Proceed with Rituximab  infusion today. Physical examination stable   Return to clinic in 2 months for next infusion and follow-up  Leukopenia and thrombocytopenia Likely secondary to mantle cell lymphoma.  Can likely be secondary to rituximab  as well. Stable at this time Does not report frequent infections or bleeding  - Continue to monitor    The patient understands the plans discussed today and is in agreement with them.  He knows to contact our office if he develops concerns prior to his next appointment.  The total time spent in the appointment was 13 minutes for the encounter with patient, including review of chart and various tests results, discussions about plan of care and coordination of care plan   Mickiel Dry, MD  [facility] [place] - A DEPT OF [facility] [address] [place] [address] Dept: [phone] Dept Fax: [phone]   No orders of the defined types were placed in this encounter.    ONCOLOGY HISTORY:   I have reviewed his chart and materials related to his cancer  extensively and collaborated history with the patient. 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 Abnormal Cells in gated population: 76 % (total lymphocyte count is 7.7 K/ul). Phenotype of Abnormal Cells: CD5, CD19, CD20, CD21, CD22, CD23, HLA-DR, Lambda. -CLL FISH panel:  72% of nuclei positive for hemizygous and homozygous 13q. deletion with T p53  and ATM mutation negative. IgHV hyper mutation present. -12/12/2022: CT chest: Large right hilar mass or bulky lymphadenopathy, difficult to clearly distinguish from adjacent structures and lymph nodes although measuring at least 7.5 x 6.4 cm and encasing the right mainstem bronchus and lobar branches. Additional mediastinal lymphadenopathy, as well as axillary and lower cervical lymphadenopathy. -12/12/2022: CT AP: Severe splenomegaly, maximum coronal span 21.5 cm. Prominent retroperitoneal lymph nodes. Subcapsular hypodense lesion of the peripheral liver dome, hepatic segment VIII measuring 1.6 x 1.1 cm. Additional subcentimeter hypodense lesions, too small to characterize. -12/29/2022: Initial labs. Immunoglobulins: IgG 289, IgA 23, IgM 13, IgE <2. Haptoglobin 26. Reticulocytes normal. LDH normal. Beta-2  Microglobulin 4.6. Hepatitis B and Hepatitis C negative.  -01/08/2023: PET: Hypermetabolic adenopathy above and below the diaphragm with splenomegaly, compatible with patient's known lymphocytic leukemia. -01/28/2023: Bronchoscopy and right hilar mass biopsy.  Pathology: 87% of the cells are CD5 positive lambda restricted B cells coexpressing +/- CD38 and CD200. FISH for t(11:14) positive indicating mantle cell lymphoma.  -03/22/2023-07/21/2023: 6 cycles  of Bendamustine  and rituximab  completed -08/27/2023: PET: No findings for recurrent  lymphoma involving the neck, chest, abdomen/pelvis or bony structures. Interval decrease in size of the spleen. -09/30/2023-current: Maintenance rituximab  -07/26/2024: CT CAP: No evidence of recurrent lymphoma.   Current Treatment:  Maintenance rituximab   INTERVAL HISTORY:   Discussed the use of AI scribe software for clinical note transcription with the patient, who gave verbal consent to proceed.  History of Present Illness Travis Wood is a 74 year old male with Non-Hodgkin lymphoma undergoing rituximab  therapy who presents for routine follow-up to assess treatment response and monitor for complications.  He is currently receiving rituximab  for Non-Hodgkin lymphoma, having completed seven to eight cycles with approximately four infusions remaining. He has tolerated therapy without adverse effects or complications.  He reports no new symptoms. He denies lymphadenopathy, neck masses, fever, chills, night sweats, or unintentional weight loss. He feels well and has no concerns at this time.  Recent laboratory studies showed a mild elevation in LDH.    I have reviewed the past medical history, past surgical history, social history and family history with the patient and they are unchanged from previous note.  ALLERGIES:  has no known allergies.  MEDICATIONS:  Current Outpatient Medications  Medication Sig Dispense Refill   antiseptic oral rinse (BIOTENE) LIQD 15 mLs by Mouth Rinse route as needed for dry mouth.     atorvastatin  (LIPITOR) 40 MG tablet Take 1 tablet (40 mg total) by mouth daily. 90 tablet 0   ferrous sulfate  325 (65 FE) MG tablet Take 325 mg by mouth daily with breakfast.     ibuprofen (ADVIL) 800 MG tablet Take by mouth.     Multiple Vitamin (MULTIVITAMIN WITH MINERALS) TABS tablet Take 1 tablet by mouth daily.     OLIVE LEAF EXTRACT PO Take 1 tablet by mouth in the morning and at bedtime.     OVER THE COUNTER MEDICATION Take 1 each by mouth 5 (five) times daily.  [name]     riTUXimab  (RITUXAN  IV) Inject into the vein every 28 (twenty-eight) days.     Vitamin D , Ergocalciferol , (DRISDOL ) 1.25 MG (50000 UNIT) CAPS capsule Take 1 capsule (50,000 Units total) by mouth every 7 (seven) days. 13 capsule 1   No current facility-administered medications for this visit.   VITALS:  Temperature 98.2 F (36.8 C), temperature source Tympanic.  Wt Readings from Last 3 Encounters:  12/12/24 225 lb (102.1 kg)  11/21/24 216 lb (98 kg)  11/04/24 225 lb (102.1 kg)    There is no height or weight on file to calculate BMI.  Performance status (ECOG): 1 - Symptomatic but completely ambulatory  PHYSICAL EXAM:   GENERAL:alert, no distress and comfortable SKIN: skin color, texture, turgor are normal, no rashes or significant lesions LYMPH:  no palpable lymphadenopathy in the cervical, axillary or inguinal LUNGS: clear to auscultation and percussion with normal breathing effort HEART: regular rate & rhythm and no murmurs and no lower extremity edema ABDOMEN:abdomen soft, non-tender and normal bowel sounds Musculoskeletal:no cyanosis of digits and no clubbing  NEURO: alert & oriented x 3 with fluent speech   LABORATORY DATA:  I have reviewed the data as listed  Lab Results  Component Value Date   WBC 3.9 (L) 12/12/2024   NEUTROABS 2.8 12/12/2024   HGB 13.0 12/12/2024   HCT 39.9 12/12/2024   MCV 91.3 12/12/2024   PLT 154 12/12/2024      Chemistry      Component Value Date/Time  NA 141 12/12/2024 0931   NA 141 11/21/2024 1415   K 4.4 12/12/2024 0931   CL 104 12/12/2024 0931   CO2 23 12/12/2024 0931   BUN 15 12/12/2024 0931   BUN 12 11/21/2024 1415   CREATININE 0.71 12/12/2024 0931      Component Value Date/Time   CALCIUM  8.7 (L) 12/12/2024 0931   ALKPHOS 124 12/12/2024 0931   AST 24 12/12/2024 0931   ALT 15 12/12/2024 0931   BILITOT 0.3 12/12/2024 0931   BILITOT 0.5 11/21/2024 1415      Latest Reference Range & Units 12/12/24 09:31   LDH 105 - 235 U/L 406 (H)  (H): Data is abnormally high  RADIOGRAPHIC STUDIES: I have personally reviewed the radiological images as listed and agreed with the findings in the report.  None new to review  "

## 2024-12-12 ENCOUNTER — Inpatient Hospital Stay

## 2024-12-12 ENCOUNTER — Inpatient Hospital Stay: Admitting: Oncology

## 2024-12-12 ENCOUNTER — Inpatient Hospital Stay: Attending: Hematology

## 2024-12-12 VITALS — BP 133/83 | HR 59 | Temp 97.8°F | Resp 18

## 2024-12-12 VITALS — Temp 98.2°F

## 2024-12-12 DIAGNOSIS — C8318 Mantle cell lymphoma, lymph nodes of multiple sites: Secondary | ICD-10-CM | POA: Diagnosis not present

## 2024-12-12 DIAGNOSIS — Z79620 Long term (current) use of immunosuppressive biologic: Secondary | ICD-10-CM | POA: Diagnosis not present

## 2024-12-12 DIAGNOSIS — D696 Thrombocytopenia, unspecified: Secondary | ICD-10-CM | POA: Diagnosis not present

## 2024-12-12 DIAGNOSIS — Z5112 Encounter for antineoplastic immunotherapy: Secondary | ICD-10-CM | POA: Insufficient documentation

## 2024-12-12 DIAGNOSIS — D72819 Decreased white blood cell count, unspecified: Secondary | ICD-10-CM | POA: Diagnosis not present

## 2024-12-12 LAB — CBC WITH DIFFERENTIAL/PLATELET
Abs Immature Granulocytes: 0.03 K/uL (ref 0.00–0.07)
Basophils Absolute: 0 K/uL (ref 0.0–0.1)
Basophils Relative: 1 %
Eosinophils Absolute: 0.1 K/uL (ref 0.0–0.5)
Eosinophils Relative: 2 %
HCT: 39.9 % (ref 39.0–52.0)
Hemoglobin: 13 g/dL (ref 13.0–17.0)
Immature Granulocytes: 1 %
Lymphocytes Relative: 16 %
Lymphs Abs: 0.6 K/uL — ABNORMAL LOW (ref 0.7–4.0)
MCH: 29.7 pg (ref 26.0–34.0)
MCHC: 32.6 g/dL (ref 30.0–36.0)
MCV: 91.3 fL (ref 80.0–100.0)
Monocytes Absolute: 0.4 K/uL (ref 0.1–1.0)
Monocytes Relative: 10 %
Neutro Abs: 2.8 K/uL (ref 1.7–7.7)
Neutrophils Relative %: 70 %
Platelets: 154 K/uL (ref 150–400)
RBC: 4.37 MIL/uL (ref 4.22–5.81)
RDW: 12.7 % (ref 11.5–15.5)
WBC: 3.9 K/uL — ABNORMAL LOW (ref 4.0–10.5)
nRBC: 0 % (ref 0.0–0.2)

## 2024-12-12 LAB — COMPREHENSIVE METABOLIC PANEL WITH GFR
ALT: 15 U/L (ref 0–44)
AST: 24 U/L (ref 15–41)
Albumin: 4.3 g/dL (ref 3.5–5.0)
Alkaline Phosphatase: 124 U/L (ref 38–126)
Anion gap: 13 (ref 5–15)
BUN: 15 mg/dL (ref 8–23)
CO2: 23 mmol/L (ref 22–32)
Calcium: 8.7 mg/dL — ABNORMAL LOW (ref 8.9–10.3)
Chloride: 104 mmol/L (ref 98–111)
Creatinine, Ser: 0.71 mg/dL (ref 0.61–1.24)
GFR, Estimated: >60 mL/min
Glucose, Bld: 93 mg/dL (ref 70–99)
Potassium: 4.4 mmol/L (ref 3.5–5.1)
Sodium: 141 mmol/L (ref 135–145)
Total Bilirubin: 0.3 mg/dL (ref 0.0–1.2)
Total Protein: 6.3 g/dL — ABNORMAL LOW (ref 6.5–8.1)

## 2024-12-12 LAB — LACTATE DEHYDROGENASE: LDH: 406 U/L — ABNORMAL HIGH (ref 105–235)

## 2024-12-12 LAB — MAGNESIUM: Magnesium: 2.4 mg/dL (ref 1.7–2.4)

## 2024-12-12 MED ORDER — FAMOTIDINE IN NACL 20-0.9 MG/50ML-% IV SOLN
20.0000 mg | Freq: Once | INTRAVENOUS | Status: AC
  Administered 2024-12-12: 20 mg via INTRAVENOUS
  Filled 2024-12-12: qty 50

## 2024-12-12 MED ORDER — ACETAMINOPHEN 325 MG PO TABS
650.0000 mg | ORAL_TABLET | Freq: Once | ORAL | Status: AC
  Administered 2024-12-12: 650 mg via ORAL
  Filled 2024-12-12: qty 2

## 2024-12-12 MED ORDER — CETIRIZINE HCL 10 MG/ML IV SOLN
10.0000 mg | Freq: Once | INTRAVENOUS | Status: AC
  Administered 2024-12-12: 10 mg via INTRAVENOUS
  Filled 2024-12-12: qty 1

## 2024-12-12 MED ORDER — SODIUM CHLORIDE 0.9 % IV SOLN
375.0000 mg/m2 | Freq: Once | INTRAVENOUS | Status: AC
  Administered 2024-12-12: 900 mg via INTRAVENOUS
  Filled 2024-12-12: qty 50

## 2024-12-12 MED ORDER — SODIUM CHLORIDE 0.9 % IV SOLN: Freq: Once | INTRAVENOUS | Status: AC

## 2024-12-12 NOTE — Progress Notes (Signed)
 Patient has been examined by Dr. Davonna. Vital signs and labs have been reviewed by MD - ANC, Creatinine, LFTs, hemoglobin, and platelets have been reviewed by M.D. - pt may proceed with treatment.  Primary RN and pharmacy notified.

## 2024-12-12 NOTE — Patient Instructions (Signed)
 [facility] [place] - A DEPT OF [facility]  Discharge Instructions: Thank you for choosing [facility] to provide your oncology and hematology care.  If you have a lab appointment with the Cancer Center - please note that after April 8th, 2024, all labs will be drawn in the cancer center.  You do not have to check in or register with the main entrance as you have in the past but will complete your check-in in the cancer center.  Wear comfortable clothing and clothing appropriate for easy access to any Portacath or PICC line.   We strive to give you quality time with your provider. You may need to reschedule your appointment if you arrive late (15 or more minutes).  Arriving late affects you and other patients whose appointments are after yours.  Also, if you miss three or more appointments without notifying the office, you may be dismissed from the clinic at the provider's discretion.      For prescription refill requests, have your pharmacy contact our office and allow 72 hours for refills to be completed.    Today you received the following chemotherapy and/or immunotherapy agents rituxan.        To help prevent nausea and vomiting after your treatment, we encourage you to take your nausea medication as directed.  BELOW ARE SYMPTOMS THAT SHOULD BE REPORTED IMMEDIATELY: *FEVER GREATER THAN 100.4 F (38 C) OR HIGHER *CHILLS OR SWEATING *NAUSEA AND VOMITING THAT IS NOT CONTROLLED WITH YOUR NAUSEA MEDICATION *UNUSUAL SHORTNESS OF BREATH *UNUSUAL BRUISING OR BLEEDING *URINARY PROBLEMS (pain or burning when urinating, or frequent urination) *BOWEL PROBLEMS (unusual diarrhea, constipation, pain near the anus) TENDERNESS IN MOUTH AND THROAT WITH OR WITHOUT PRESENCE OF ULCERS (sore throat, sores in mouth, or a toothache) UNUSUAL RASH, SWELLING OR PAIN  UNUSUAL VAGINAL DISCHARGE OR ITCHING   Items with * indicate a potential emergency and should be followed  up as soon as possible or go to the Emergency Department if any problems should occur.  Please show the CHEMOTHERAPY ALERT CARD or IMMUNOTHERAPY ALERT CARD at check-in to the Emergency Department and triage nurse.  Should you have questions after your visit or need to cancel or reschedule your appointment, please contact [facility] [place] - A DEPT OF [facility] [phone]  and follow the prompts.  Office hours are 8:00 a.m. to 4:30 p.m. Monday - Friday. Please note that voicemails left after 4:00 p.m. may not be returned until the following business day.  We are closed weekends and major holidays. You have access to a nurse at all times for urgent questions. Please call the main number to the clinic [phone] and follow the prompts.  For any non-urgent questions, you may also contact your provider using MyChart. We now offer e-Visits for anyone 73 and older to request care online for non-urgent symptoms. For details visit [email or web address].   Also download the MyChart app! Go to the app store, search "MyChart", open the app, select [place], and log in with your MyChart username and password.

## 2024-12-12 NOTE — Progress Notes (Signed)

## 2024-12-12 NOTE — Patient Instructions (Signed)

## 2024-12-13 ENCOUNTER — Other Ambulatory Visit: Payer: Self-pay

## 2024-12-18 ENCOUNTER — Encounter: Payer: Self-pay | Admitting: Oncology

## 2024-12-19 ENCOUNTER — Other Ambulatory Visit: Payer: Self-pay | Admitting: *Deleted

## 2024-12-19 DIAGNOSIS — C8318 Mantle cell lymphoma, lymph nodes of multiple sites: Secondary | ICD-10-CM

## 2024-12-26 ENCOUNTER — Other Ambulatory Visit: Payer: Self-pay

## 2025-02-06 ENCOUNTER — Ambulatory Visit (HOSPITAL_COMMUNITY)

## 2025-02-13 ENCOUNTER — Inpatient Hospital Stay: Admitting: Oncology

## 2025-02-13 ENCOUNTER — Inpatient Hospital Stay

## 2025-02-13 ENCOUNTER — Inpatient Hospital Stay: Attending: Hematology
